# Patient Record
Sex: Female | Born: 1985
Health system: Southern US, Community
[De-identification: ages and names within clinical notes are randomized; demographics above are authoritative.]

## PROBLEM LIST (undated history)

## (undated) DIAGNOSIS — S069X0S Unspecified intracranial injury without loss of consciousness, sequela: Secondary | ICD-10-CM

## (undated) DIAGNOSIS — R4189 Other symptoms and signs involving cognitive functions and awareness: Secondary | ICD-10-CM

## (undated) DIAGNOSIS — N920 Excessive and frequent menstruation with regular cycle: Secondary | ICD-10-CM

## (undated) DIAGNOSIS — Z8781 Personal history of (healed) traumatic fracture: Secondary | ICD-10-CM

## (undated) DIAGNOSIS — F419 Anxiety disorder, unspecified: Secondary | ICD-10-CM

## (undated) DIAGNOSIS — G894 Chronic pain syndrome: Secondary | ICD-10-CM

## (undated) DIAGNOSIS — S069XAA Unspecified intracranial injury with loss of consciousness status unknown, initial encounter: Secondary | ICD-10-CM

## (undated) DIAGNOSIS — S069X9A Unspecified intracranial injury with loss of consciousness of unspecified duration, initial encounter: Secondary | ICD-10-CM

## (undated) DIAGNOSIS — R011 Cardiac murmur, unspecified: Secondary | ICD-10-CM

## (undated) DIAGNOSIS — F32A Depression, unspecified: Secondary | ICD-10-CM

## (undated) DIAGNOSIS — G43E09 Chronic migraine with aura, not intractable, without status migrainosus: Secondary | ICD-10-CM

## (undated) DIAGNOSIS — M542 Cervicalgia: Secondary | ICD-10-CM

## (undated) DIAGNOSIS — D649 Anemia, unspecified: Secondary | ICD-10-CM

## (undated) DIAGNOSIS — F068 Other specified mental disorders due to known physiological condition: Secondary | ICD-10-CM

## (undated) DIAGNOSIS — G43109 Migraine with aura, not intractable, without status migrainosus: Secondary | ICD-10-CM

## (undated) DIAGNOSIS — F329 Major depressive disorder, single episode, unspecified: Secondary | ICD-10-CM

## (undated) HISTORY — DX: Personal history of (healed) traumatic fracture: Z87.81

## (undated) HISTORY — DX: Cervicalgia: M54.2

## (undated) HISTORY — PX: OTHER SURGICAL HISTORY: SHX169

## (undated) HISTORY — DX: Migraine with aura, not intractable, without status migrainosus: G43.109

## (undated) HISTORY — DX: Anemia, unspecified: D64.9

## (undated) HISTORY — PX: APPENDECTOMY: SHX54

## (undated) HISTORY — DX: Chronic migraine with aura, not intractable, without status migrainosus: G43.E09

---

## 2009-05-31 ENCOUNTER — Ambulatory Visit (HOSPITAL_COMMUNITY): Admission: RE | Admit: 2009-05-31 | Discharge: 2009-05-31 | Payer: Self-pay | Admitting: Obstetrics and Gynecology

## 2009-06-15 ENCOUNTER — Ambulatory Visit (HOSPITAL_COMMUNITY): Admission: RE | Admit: 2009-06-15 | Discharge: 2009-06-15 | Payer: Self-pay | Admitting: Obstetrics and Gynecology

## 2009-06-22 ENCOUNTER — Ambulatory Visit (HOSPITAL_COMMUNITY): Admission: RE | Admit: 2009-06-22 | Discharge: 2009-06-22 | Payer: Self-pay | Admitting: Obstetrics and Gynecology

## 2009-06-29 ENCOUNTER — Ambulatory Visit (HOSPITAL_COMMUNITY): Admission: RE | Admit: 2009-06-29 | Discharge: 2009-06-29 | Payer: Self-pay | Admitting: Obstetrics and Gynecology

## 2009-07-20 ENCOUNTER — Ambulatory Visit (HOSPITAL_COMMUNITY): Admission: RE | Admit: 2009-07-20 | Discharge: 2009-07-20 | Payer: Self-pay | Admitting: Obstetrics and Gynecology

## 2009-08-10 ENCOUNTER — Ambulatory Visit (HOSPITAL_COMMUNITY): Admission: RE | Admit: 2009-08-10 | Discharge: 2009-08-10 | Payer: Self-pay | Admitting: Obstetrics and Gynecology

## 2010-10-30 ENCOUNTER — Encounter: Payer: Self-pay | Admitting: Obstetrics and Gynecology

## 2012-12-14 ENCOUNTER — Emergency Department (HOSPITAL_COMMUNITY): Payer: Medicaid Other

## 2012-12-14 ENCOUNTER — Encounter (HOSPITAL_COMMUNITY): Payer: Self-pay | Admitting: Anesthesiology

## 2012-12-14 ENCOUNTER — Emergency Department (HOSPITAL_COMMUNITY): Payer: Medicaid Other | Admitting: Anesthesiology

## 2012-12-14 ENCOUNTER — Inpatient Hospital Stay (HOSPITAL_COMMUNITY): Payer: Medicaid Other

## 2012-12-14 ENCOUNTER — Inpatient Hospital Stay (HOSPITAL_COMMUNITY)
Admission: EM | Admit: 2012-12-14 | Discharge: 2012-12-26 | DRG: 957 | Disposition: A | Discharge status: Rehabilitation Facility | Payer: Medicaid Other | Attending: Orthopedic Surgery | Admitting: Orthopedic Surgery

## 2012-12-14 ENCOUNTER — Encounter (HOSPITAL_COMMUNITY): Payer: Self-pay | Admitting: Emergency Medicine

## 2012-12-14 ENCOUNTER — Encounter (HOSPITAL_COMMUNITY): Admission: EM | Disposition: A | Discharge status: Rehabilitation Facility | Payer: Self-pay | Source: Home / Self Care

## 2012-12-14 DIAGNOSIS — S060X9A Concussion with loss of consciousness of unspecified duration, initial encounter: Secondary | ICD-10-CM

## 2012-12-14 DIAGNOSIS — S12100A Unspecified displaced fracture of second cervical vertebra, initial encounter for closed fracture: Principal | ICD-10-CM | POA: Diagnosis present

## 2012-12-14 DIAGNOSIS — S129XXA Fracture of neck, unspecified, initial encounter: Secondary | ICD-10-CM

## 2012-12-14 DIAGNOSIS — S27329A Contusion of lung, unspecified, initial encounter: Secondary | ICD-10-CM

## 2012-12-14 DIAGNOSIS — IMO0002 Reserved for concepts with insufficient information to code with codable children: Secondary | ICD-10-CM | POA: Diagnosis present

## 2012-12-14 DIAGNOSIS — S01502A Unspecified open wound of oral cavity, initial encounter: Secondary | ICD-10-CM | POA: Diagnosis present

## 2012-12-14 DIAGNOSIS — S2220XA Unspecified fracture of sternum, initial encounter for closed fracture: Secondary | ICD-10-CM

## 2012-12-14 DIAGNOSIS — N390 Urinary tract infection, site not specified: Secondary | ICD-10-CM | POA: Diagnosis not present

## 2012-12-14 DIAGNOSIS — G43909 Migraine, unspecified, not intractable, without status migrainosus: Secondary | ICD-10-CM | POA: Diagnosis present

## 2012-12-14 DIAGNOSIS — S82209A Unspecified fracture of shaft of unspecified tibia, initial encounter for closed fracture: Secondary | ICD-10-CM

## 2012-12-14 DIAGNOSIS — D62 Acute posthemorrhagic anemia: Secondary | ICD-10-CM | POA: Diagnosis present

## 2012-12-14 DIAGNOSIS — S93439A Sprain of tibiofibular ligament of unspecified ankle, initial encounter: Secondary | ICD-10-CM | POA: Diagnosis present

## 2012-12-14 DIAGNOSIS — S060XAA Concussion with loss of consciousness status unknown, initial encounter: Secondary | ICD-10-CM | POA: Diagnosis present

## 2012-12-14 DIAGNOSIS — Y998 Other external cause status: Secondary | ICD-10-CM

## 2012-12-14 DIAGNOSIS — R339 Retention of urine, unspecified: Secondary | ICD-10-CM | POA: Diagnosis not present

## 2012-12-14 DIAGNOSIS — S82899A Other fracture of unspecified lower leg, initial encounter for closed fracture: Secondary | ICD-10-CM | POA: Diagnosis present

## 2012-12-14 DIAGNOSIS — J95821 Acute postprocedural respiratory failure: Secondary | ICD-10-CM | POA: Diagnosis not present

## 2012-12-14 DIAGNOSIS — S82209B Unspecified fracture of shaft of unspecified tibia, initial encounter for open fracture type I or II: Secondary | ICD-10-CM | POA: Diagnosis present

## 2012-12-14 DIAGNOSIS — S82899B Other fracture of unspecified lower leg, initial encounter for open fracture type I or II: Secondary | ICD-10-CM | POA: Diagnosis present

## 2012-12-14 DIAGNOSIS — S82891A Other fracture of right lower leg, initial encounter for closed fracture: Secondary | ICD-10-CM

## 2012-12-14 DIAGNOSIS — J9600 Acute respiratory failure, unspecified whether with hypoxia or hypercapnia: Secondary | ICD-10-CM

## 2012-12-14 DIAGNOSIS — S82892A Other fracture of left lower leg, initial encounter for closed fracture: Secondary | ICD-10-CM

## 2012-12-14 DIAGNOSIS — A498 Other bacterial infections of unspecified site: Secondary | ICD-10-CM | POA: Diagnosis not present

## 2012-12-14 DIAGNOSIS — Y92410 Unspecified street and highway as the place of occurrence of the external cause: Secondary | ICD-10-CM

## 2012-12-14 DIAGNOSIS — S82409A Unspecified fracture of shaft of unspecified fibula, initial encounter for closed fracture: Secondary | ICD-10-CM

## 2012-12-14 HISTORY — PX: EXTERNAL FIXATION LEG: SHX1549

## 2012-12-14 HISTORY — DX: Anxiety disorder, unspecified: F41.9

## 2012-12-14 HISTORY — DX: Depression, unspecified: F32.A

## 2012-12-14 HISTORY — DX: Major depressive disorder, single episode, unspecified: F32.9

## 2012-12-14 HISTORY — DX: Cardiac murmur, unspecified: R01.1

## 2012-12-14 HISTORY — PX: ORIF ANKLE FRACTURE: SHX5408

## 2012-12-14 HISTORY — PX: I & D EXTREMITY: SHX5045

## 2012-12-14 LAB — URINALYSIS, MICROSCOPIC ONLY
Bilirubin Urine: NEGATIVE
Leukocytes, UA: NEGATIVE
pH: 5.5 (ref 5.0–8.0)

## 2012-12-14 LAB — POCT I-STAT, CHEM 8
BUN: 12 mg/dL (ref 6–23)
Calcium, Ion: 1.14 mmol/L (ref 1.12–1.23)
Creatinine, Ser: 0.9 mg/dL (ref 0.50–1.10)
Glucose, Bld: 220 mg/dL — ABNORMAL HIGH (ref 70–99)
TCO2: 27 mmol/L (ref 0–100)

## 2012-12-14 LAB — CBC
HCT: 29.6 % — ABNORMAL LOW (ref 36.0–46.0)
HCT: 37.1 % (ref 36.0–46.0)
Hemoglobin: 12.3 g/dL (ref 12.0–15.0)
Hemoglobin: 9.9 g/dL — ABNORMAL LOW (ref 12.0–15.0)
MCH: 26 pg (ref 26.0–34.0)
MCHC: 33.2 g/dL (ref 30.0–36.0)
MCHC: 33.4 g/dL (ref 30.0–36.0)
MCV: 78.9 fL (ref 78.0–100.0)
RBC: 4.73 MIL/uL (ref 3.87–5.11)
RDW: 13.1 % (ref 11.5–15.5)
RDW: 13.2 % (ref 11.5–15.5)
WBC: 23.3 10*3/uL — ABNORMAL HIGH (ref 4.0–10.5)

## 2012-12-14 LAB — COMPREHENSIVE METABOLIC PANEL
ALT: 24 U/L (ref 0–35)
Albumin: 4.1 g/dL (ref 3.5–5.2)
BUN: 12 mg/dL (ref 6–23)
CO2: 22 mEq/L (ref 19–32)
Calcium: 9 mg/dL (ref 8.4–10.5)
Chloride: 98 mEq/L (ref 96–112)
GFR calc non Af Amer: >90 mL/min (ref >90)
Potassium: 3.2 mEq/L — ABNORMAL LOW (ref 3.5–5.1)
Sodium: 136 mEq/L (ref 135–145)
Total Bilirubin: 0.5 mg/dL (ref 0.3–1.2)
Total Protein: 7.6 g/dL (ref 6.0–8.3)

## 2012-12-14 LAB — GLUCOSE, CAPILLARY: Glucose-Capillary: 163 mg/dL — ABNORMAL HIGH (ref 70–99)

## 2012-12-14 LAB — RAPID URINE DRUG SCREEN, HOSP PERFORMED
Amphetamines: NOT DETECTED
Barbiturates: NOT DETECTED
Benzodiazepines: NOT DETECTED
Cocaine: NOT DETECTED
Tetrahydrocannabinol: NOT DETECTED

## 2012-12-14 LAB — POCT I-STAT 3, ART BLOOD GAS (G3+)
Acid-Base Excess: 2 mmol/L (ref 0.0–2.0)
Acid-Base Excess: 1 mmol/L (ref 0.0–2.0)
Acid-Base Excess: 2 mmol/L (ref 0.0–2.0)
Bicarbonate: 28.6 mEq/L — ABNORMAL HIGH (ref 20.0–24.0)
Bicarbonate: 28.4 mEq/L — ABNORMAL HIGH (ref 20.0–24.0)
O2 Saturation: 100 %
TCO2: 28 mmol/L (ref 0–100)
pCO2 arterial: 49.2 mmHg — ABNORMAL HIGH (ref 35.0–45.0)
pO2, Arterial: 180 mmHg — ABNORMAL HIGH (ref 80.0–100.0)
pO2, Arterial: 177 mmHg — ABNORMAL HIGH (ref 80.0–100.0)

## 2012-12-14 LAB — BASIC METABOLIC PANEL
BUN: 7 mg/dL (ref 6–23)
CO2: 24 mEq/L (ref 19–32)
Chloride: 105 mEq/L (ref 96–112)
Creatinine, Ser: 0.54 mg/dL (ref 0.50–1.10)

## 2012-12-14 LAB — POCT I-STAT 7, (LYTES, BLD GAS, ICA,H+H)
Calcium, Ion: 1.12 mmol/L (ref 1.12–1.23)
O2 Saturation: 100 %
Sodium: 138 mEq/L (ref 135–145)
TCO2: 27 mmol/L (ref 0–100)
pCO2 arterial: 41.4 mmHg (ref 35.0–45.0)

## 2012-12-14 LAB — PROTIME-INR
INR: 0.97 (ref 0.00–1.49)
Prothrombin Time: 12.8 seconds (ref 11.6–15.2)

## 2012-12-14 SURGERY — OPEN REDUCTION INTERNAL FIXATION (ORIF) ANKLE FRACTURE
Anesthesia: General | Site: Leg Lower | Laterality: Left | Wound class: Contaminated

## 2012-12-14 MED ORDER — HYDROMORPHONE HCL PF 1 MG/ML IJ SOLN: 0.2500 mg | INTRAMUSCULAR | Status: DC | PRN

## 2012-12-14 MED ORDER — TETANUS-DIPHTHERIA TOXOIDS TD 5-2 LFU IM INJ
0.5000 mL | INJECTION | Freq: Once | INTRAMUSCULAR | Status: AC
  Administered 2012-12-14: 0.5 mL via INTRAMUSCULAR
  Filled 2012-12-14: qty 0.5

## 2012-12-14 MED ORDER — CHLORHEXIDINE GLUCONATE 0.12 % MT SOLN
15.0000 mL | Freq: Two times a day (BID) | OROMUCOSAL | Status: DC
  Administered 2012-12-15 – 2012-12-26 (×18): 15 mL via OROMUCOSAL
  Filled 2012-12-14 (×24): qty 15

## 2012-12-14 MED ORDER — PROPOFOL 10 MG/ML IV EMUL
5.0000 ug/kg/min | INTRAVENOUS | Status: DC
  Administered 2012-12-15: 25 ug/kg/min via INTRAVENOUS
  Administered 2012-12-15 (×2): 50 ug/kg/min via INTRAVENOUS
  Filled 2012-12-14 (×5): qty 100

## 2012-12-14 MED ORDER — SODIUM CHLORIDE 0.9 % IV SOLN
1000.0000 mL | Freq: Once | INTRAVENOUS | Status: AC
  Administered 2012-12-14: 1000 mL via INTRAVENOUS

## 2012-12-14 MED ORDER — ROCURONIUM BROMIDE 100 MG/10ML IV SOLN
INTRAVENOUS | Status: DC | PRN
  Administered 2012-12-14: 50 mg via INTRAVENOUS

## 2012-12-14 MED ORDER — FENTANYL CITRATE 0.05 MG/ML IJ SOLN
100.0000 ug | INTRAMUSCULAR | Status: DC | PRN
  Administered 2012-12-14 – 2012-12-16 (×2): 100 ug via INTRAVENOUS
  Administered 2012-12-16 (×3): 50 ug via INTRAVENOUS
  Administered 2012-12-16 (×2): 100 ug via INTRAVENOUS
  Filled 2012-12-14 (×8): qty 2

## 2012-12-14 MED ORDER — HYDROMORPHONE HCL PF 1 MG/ML IJ SOLN: 0.5000 mg | INTRAMUSCULAR | Status: DC | PRN

## 2012-12-14 MED ORDER — LIDOCAINE HCL (CARDIAC) 20 MG/ML IV SOLN
INTRAVENOUS | Status: DC | PRN
  Administered 2012-12-14: 10 mg via INTRAVENOUS

## 2012-12-14 MED ORDER — CEFAZOLIN SODIUM 1-5 GM-% IV SOLN: 1.0000 g | Freq: Once | INTRAVENOUS | Status: DC

## 2012-12-14 MED ORDER — SODIUM CHLORIDE 0.9 % IV SOLN
1000.0000 mL | INTRAVENOUS | Status: DC
  Administered 2012-12-14: 1000 mL via INTRAVENOUS

## 2012-12-14 MED ORDER — MEPERIDINE HCL 25 MG/ML IJ SOLN: 6.2500 mg | INTRAMUSCULAR | Status: DC | PRN

## 2012-12-14 MED ORDER — FENTANYL CITRATE 0.05 MG/ML IJ SOLN
INTRAMUSCULAR | Status: AC
  Administered 2012-12-14: 100 ug via INTRAVENOUS
  Filled 2012-12-14: qty 2

## 2012-12-14 MED ORDER — DOCUSATE SODIUM 100 MG PO CAPS
100.0000 mg | ORAL_CAPSULE | Freq: Two times a day (BID) | ORAL | Status: DC
  Filled 2012-12-14: qty 1

## 2012-12-14 MED ORDER — MEPERIDINE HCL 25 MG/ML IJ SOLN: 25.0000 mg | Freq: Once | INTRAMUSCULAR | Status: DC

## 2012-12-14 MED ORDER — ONDANSETRON HCL 4 MG/2ML IJ SOLN: 4.0000 mg | Freq: Four times a day (QID) | INTRAMUSCULAR | Status: DC | PRN

## 2012-12-14 MED ORDER — OXYCODONE HCL 5 MG PO TABS: 10.0000 mg | ORAL_TABLET | ORAL | Status: DC | PRN

## 2012-12-14 MED ORDER — NALOXONE HCL 0.4 MG/ML IJ SOLN
0.4000 mg | Freq: Once | INTRAMUSCULAR | Status: DC
  Filled 2012-12-14: qty 2

## 2012-12-14 MED ORDER — IOHEXOL 300 MG/ML  SOLN
100.0000 mL | Freq: Once | INTRAMUSCULAR | Status: AC | PRN
  Administered 2012-12-14: 100 mL via INTRAVENOUS

## 2012-12-14 MED ORDER — MIDAZOLAM HCL 5 MG/5ML IJ SOLN
INTRAMUSCULAR | Status: DC | PRN
  Administered 2012-12-14: 4 mg via INTRAVENOUS

## 2012-12-14 MED ORDER — OXYCODONE HCL 5 MG/5ML PO SOLN: 5.0000 mg | Freq: Once | ORAL | Status: DC | PRN

## 2012-12-14 MED ORDER — CEFAZOLIN SODIUM 1-5 GM-% IV SOLN
INTRAVENOUS | Status: DC | PRN
  Administered 2012-12-14: 1 g via INTRAVENOUS

## 2012-12-14 MED ORDER — POTASSIUM CHLORIDE 10 MEQ/100ML IV SOLN
10.0000 meq | INTRAVENOUS | Status: AC
  Administered 2012-12-14 (×2): 10 meq via INTRAVENOUS
  Filled 2012-12-14 (×2): qty 100

## 2012-12-14 MED ORDER — CEFAZOLIN SODIUM 1-5 GM-% IV SOLN
1.0000 g | Freq: Once | INTRAVENOUS | Status: AC
  Administered 2012-12-14: 1 g via INTRAVENOUS
  Filled 2012-12-14: qty 50

## 2012-12-14 MED ORDER — CHLORHEXIDINE GLUCONATE 0.12 % MT SOLN
OROMUCOSAL | Status: AC
  Administered 2012-12-14: 15 mL via OROMUCOSAL
  Filled 2012-12-14: qty 15

## 2012-12-14 MED ORDER — ONDANSETRON HCL 4 MG PO TABS: 4.0000 mg | ORAL_TABLET | Freq: Four times a day (QID) | ORAL | Status: DC | PRN

## 2012-12-14 MED ORDER — PANTOPRAZOLE SODIUM 40 MG IV SOLR
40.0000 mg | Freq: Every day | INTRAVENOUS | Status: DC
  Administered 2012-12-15 – 2012-12-16 (×2): 40 mg via INTRAVENOUS
  Filled 2012-12-14 (×4): qty 40

## 2012-12-14 MED ORDER — SODIUM CHLORIDE 0.9 % IV SOLN
25.0000 ug/h | INTRAVENOUS | Status: DC
  Administered 2012-12-14: 200 ug/h via INTRAVENOUS
  Administered 2012-12-14: 250 ug/h via INTRAVENOUS
  Administered 2012-12-15: 300 ug/h via INTRAVENOUS
  Filled 2012-12-14 (×3): qty 50

## 2012-12-14 MED ORDER — PROPOFOL INFUSION 10 MG/ML OPTIME
INTRAVENOUS | Status: DC | PRN
  Administered 2012-12-14: 25 ug/kg/min via INTRAVENOUS

## 2012-12-14 MED ORDER — FENTANYL CITRATE 0.05 MG/ML IJ SOLN
INTRAMUSCULAR | Status: DC | PRN
  Administered 2012-12-14 (×4): 125 ug via INTRAVENOUS
  Administered 2012-12-14: 75 ug via INTRAVENOUS
  Administered 2012-12-14: 50 ug via INTRAVENOUS
  Administered 2012-12-14: 75 ug via INTRAVENOUS
  Administered 2012-12-14: 50 ug via INTRAVENOUS

## 2012-12-14 MED ORDER — KCL IN DEXTROSE-NACL 20-5-0.45 MEQ/L-%-% IV SOLN
INTRAVENOUS | Status: DC
  Administered 2012-12-14: 100 mL/h via INTRAVENOUS
  Administered 2012-12-14 – 2012-12-17 (×6): via INTRAVENOUS
  Filled 2012-12-14 (×12): qty 1000

## 2012-12-14 MED ORDER — CEFAZOLIN SODIUM 1-5 GM-% IV SOLN
INTRAVENOUS | Status: AC
  Filled 2012-12-14: qty 50

## 2012-12-14 MED ORDER — MIDAZOLAM HCL 2 MG/2ML IJ SOLN: 0.5000 mg | Freq: Once | INTRAMUSCULAR | Status: DC | PRN

## 2012-12-14 MED ORDER — IOHEXOL 350 MG/ML SOLN
50.0000 mL | Freq: Once | INTRAVENOUS | Status: AC | PRN
  Administered 2012-12-14: 50 mL via INTRAVENOUS

## 2012-12-14 MED ORDER — PROPOFOL 10 MG/ML IV EMUL
5.0000 ug/kg/min | INTRAVENOUS | Status: DC
  Administered 2012-12-14: 50 ug/kg/min via INTRAVENOUS
  Filled 2012-12-14: qty 100

## 2012-12-14 MED ORDER — INSULIN ASPART 100 UNIT/ML ~~LOC~~ SOLN
0.0000 [IU] | SUBCUTANEOUS | Status: DC
  Administered 2012-12-15 – 2012-12-16 (×3): 1 [IU] via SUBCUTANEOUS

## 2012-12-14 MED ORDER — DOCUSATE SODIUM 50 MG/5ML PO LIQD
100.0000 mg | Freq: Two times a day (BID) | ORAL | Status: DC
  Administered 2012-12-15 – 2012-12-16 (×3): 100 mg via ORAL
  Filled 2012-12-14 (×7): qty 10

## 2012-12-14 MED ORDER — PANTOPRAZOLE SODIUM 40 MG PO TBEC: 40.0000 mg | DELAYED_RELEASE_TABLET | Freq: Every day | ORAL | Status: DC

## 2012-12-14 MED ORDER — LACTATED RINGERS IV SOLN
INTRAVENOUS | Status: DC | PRN
  Administered 2012-12-14 (×3): via INTRAVENOUS

## 2012-12-14 MED ORDER — OXYCODONE HCL 5 MG PO TABS: 5.0000 mg | ORAL_TABLET | Freq: Once | ORAL | Status: DC | PRN

## 2012-12-14 MED ORDER — CEFAZOLIN SODIUM-DEXTROSE 2-3 GM-% IV SOLR
2.0000 g | Freq: Three times a day (TID) | INTRAVENOUS | Status: AC
  Administered 2012-12-14 – 2012-12-16 (×6): 2 g via INTRAVENOUS
  Filled 2012-12-14 (×6): qty 50

## 2012-12-14 MED ORDER — PROMETHAZINE HCL 25 MG/ML IJ SOLN: 6.2500 mg | INTRAMUSCULAR | Status: DC | PRN

## 2012-12-14 MED ORDER — ACETAMINOPHEN 325 MG PO TABS
650.0000 mg | ORAL_TABLET | ORAL | Status: DC | PRN
  Administered 2012-12-16: 650 mg via ORAL
  Filled 2012-12-14 (×2): qty 1

## 2012-12-14 MED ORDER — FENTANYL BOLUS VIA INFUSION
25.0000 ug | Freq: Four times a day (QID) | INTRAVENOUS | Status: DC | PRN
  Filled 2012-12-14: qty 100

## 2012-12-14 MED ORDER — PROPOFOL 10 MG/ML IV BOLUS
INTRAVENOUS | Status: DC | PRN
  Administered 2012-12-14: 150 mg via INTRAVENOUS

## 2012-12-14 MED ORDER — OXYCODONE HCL 5 MG PO TABS: 5.0000 mg | ORAL_TABLET | ORAL | Status: DC | PRN

## 2012-12-14 MED ORDER — SUCCINYLCHOLINE CHLORIDE 20 MG/ML IJ SOLN
INTRAMUSCULAR | Status: DC | PRN
  Administered 2012-12-14: 100 mg via INTRAVENOUS

## 2012-12-14 SURGICAL SUPPLY — 59 items
BANDAGE ELASTIC 4 VELCRO ST LF (GAUZE/BANDAGES/DRESSINGS) IMPLANT
BANDAGE ELASTIC 6 VELCRO ST LF (GAUZE/BANDAGES/DRESSINGS) IMPLANT
BANDAGE ESMARK 6X9 LF (GAUZE/BANDAGES/DRESSINGS) ×4 IMPLANT
BANDAGE GAUZE ELAST BULKY 4 IN (GAUZE/BANDAGES/DRESSINGS) IMPLANT
BNDG COHESIVE 4X5 TAN STRL (GAUZE/BANDAGES/DRESSINGS) IMPLANT
BNDG ESMARK 6X9 LF (GAUZE/BANDAGES/DRESSINGS) ×5
CLAMP LG COMBINATION (Clamp) ×20 IMPLANT
CLAMP LG MULTI PIN (Clamp) ×10 IMPLANT
CLAMP ROD ATTACHMENT (Clamp) ×10 IMPLANT
CLOTH BEACON ORANGE TIMEOUT ST (SAFETY) ×5 IMPLANT
COVER SURGICAL LIGHT HANDLE (MISCELLANEOUS) ×5 IMPLANT
CUFF TOURNIQUET SINGLE 34IN LL (TOURNIQUET CUFF) IMPLANT
CUFF TOURNIQUET SINGLE 44IN (TOURNIQUET CUFF) IMPLANT
DRAPE C-ARM 42X72 X-RAY (DRAPES) ×5 IMPLANT
DRAPE EXTREMITY BILATERAL (DRAPE) ×5 IMPLANT
DRAPE INCISE IOBAN 66X45 STRL (DRAPES) ×5 IMPLANT
DRAPE ORTHO SPLIT 77X108 STRL (DRAPES)
DRAPE SURG ORHT 6 SPLT 77X108 (DRAPES) IMPLANT
DRAPE U-SHAPE 47X51 STRL (DRAPES) ×10 IMPLANT
DRSG ADAPTIC 3X8 NADH LF (GAUZE/BANDAGES/DRESSINGS) ×5 IMPLANT
ELECT REM PT RETURN 9FT ADLT (ELECTROSURGICAL) ×5
ELECTRODE REM PT RTRN 9FT ADLT (ELECTROSURGICAL) ×4 IMPLANT
EVACUATOR 1/8 PVC DRAIN (DRAIN) IMPLANT
GAUZE XEROFORM 5X9 LF (GAUZE/BANDAGES/DRESSINGS) ×5 IMPLANT
GLOVE BIOGEL PI IND STRL 6.5 (GLOVE) ×8 IMPLANT
GLOVE BIOGEL PI IND STRL 7.5 (GLOVE) ×4 IMPLANT
GLOVE BIOGEL PI INDICATOR 6.5 (GLOVE) ×2
GLOVE BIOGEL PI INDICATOR 7.5 (GLOVE) ×1
GLOVE SKINSENSE NS SZ8.0 LF (GLOVE)
GLOVE SKINSENSE STRL SZ8.0 LF (GLOVE) IMPLANT
GLOVE SURG ORTHO 8.0 STRL STRW (GLOVE) ×5 IMPLANT
GOWN STRL NON-REIN LRG LVL3 (GOWN DISPOSABLE) ×5 IMPLANT
KIT BASIN OR (CUSTOM PROCEDURE TRAY) ×5 IMPLANT
KIT ROOM TURNOVER OR (KITS) ×5 IMPLANT
PACK ORTHO EXTREMITY (CUSTOM PROCEDURE TRAY) ×5 IMPLANT
PAD ARMBOARD 7.5X6 YLW CONV (MISCELLANEOUS) ×10 IMPLANT
ROD CRBN FBR LRG EX-FX 11X300 (Rod) ×5 IMPLANT
ROD CRBN FBR LRG EX-FX 11X350 (Rod) ×15 IMPLANT
SCREW CANCEL 4.0 16MM (Screw) ×10 IMPLANT
SCREW CORTEX 3.5 14MM (Screw) ×3 IMPLANT
SCREW CORTEX 3.5 20MM (Screw) ×1 IMPLANT
SCREW LOCK CORT ST 3.5X14 (Screw) ×12 IMPLANT
SCREW LOCK CORT ST 3.5X20 (Screw) ×4 IMPLANT
SPONGE GAUZE 4X4 12PLY (GAUZE/BANDAGES/DRESSINGS) ×5 IMPLANT
SPONGE LAP 18X18 X RAY DECT (DISPOSABLE) ×10 IMPLANT
STAPLER VISISTAT 35W (STAPLE) IMPLANT
STOCKINETTE TUBULAR 6 INCH (GAUZE/BANDAGES/DRESSINGS) ×5 IMPLANT
SUT ETHILON 2 0 FS 18 (SUTURE) ×5 IMPLANT
SUT PROLENE 3 0 PS 2 (SUTURE) IMPLANT
SUT VIC AB 0 CT1 27 (SUTURE) ×1
SUT VIC AB 0 CT1 27XBRD ANBCTR (SUTURE) ×4 IMPLANT
SUT VIC AB 2-0 CT1 27 (SUTURE) ×2
SUT VIC AB 2-0 CT1 TAPERPNT 27 (SUTURE) ×8 IMPLANT
TOWEL OR 17X24 6PK STRL BLUE (TOWEL DISPOSABLE) ×5 IMPLANT
TOWEL OR 17X26 10 PK STRL BLUE (TOWEL DISPOSABLE) ×10 IMPLANT
TUBE CONNECTING 12X1/4 (SUCTIONS) ×5 IMPLANT
YANKAUER SUCT BULB TIP NO VENT (SUCTIONS) ×5 IMPLANT
screws ×20 IMPLANT
transfixation pin ×10 IMPLANT

## 2012-12-14 NOTE — Progress Notes (Signed)
Vent changes made per MD order. RR 20 VT 7cc which isi 350
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
## 2012-12-14 NOTE — Transfer of Care (Signed)
Immediate Anesthesia Transfer of Care Note  Patient: Monique [name]  Procedure(s) Performed: Procedure(s): OPEN REDUCTION INTERNAL FIXATION (ORIF) ANKLE FRACTURE (Left) IRRIGATION AND DEBRIDEMENT EXTREMITY (Left) EXTERNAL FIXATION LEG (Bilateral)  Patient Location: SICU  Anesthesia Type:General  Level of Consciousness: Patient remains intubated per anesthesia plan  Airway & Oxygen Therapy: Patient remains intubated per anesthesia plan and Patient placed on Ventilator (see vital sign flow sheet for setting)  Post-op Assessment: Report given to PACU RN  Post vital signs: Reviewed and stable  Complications: No apparent anesthesia complications

## 2012-12-14 NOTE — Progress Notes (Signed)
Pt transported from SICU to CT and back to SICU on vent. No complications during transport. BBS cl, HR 86, RR 21.

## 2012-12-14 NOTE — ED Notes (Signed)
Cap refill less than 2 seconds and pulses present bilaterally. Able to slip two fingers between restraints and wrist.
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
## 2012-12-14 NOTE — Anesthesia Preprocedure Evaluation (Signed)
Anesthesia Evaluation  Patient identified by MRN, date of birth, ID band Patient unresponsive    Reviewed: Allergy & Precautions, H&P , Patient's Chart, lab work & pertinent test results, Unable to perform ROS - Chart review only  History of Anesthesia Complications Negative for: history of anesthetic complications  Airway  TM Distance: >3 FB Neck ROM: Full    Dental   Pulmonary  Sternal fracture, pulm contusion R breath sounds clear to auscultation  Pulmonary exam normal       Cardiovascular negative cardio ROS  Rhythm:Regular Rate:Normal     Neuro/Psych PSYCHIATRIC DISORDERS Anxiety Patient very somnolent, only responds to name Cannot answer questions  C-spine not cleared    GI/Hepatic negative GI ROS, Neg liver ROS,   Endo/Other  Morbid obesity  Renal/GU negative Renal ROS     Musculoskeletal   Abdominal (+) + obese,   Peds  Hematology   Anesthesia Other Findings   Reproductive/Obstetrics                           Anesthesia Physical Anesthesia Plan  ASA: IV and emergent  Anesthesia Plan: General   Post-op Pain Management:    Induction: Intravenous  Airway Management Planned: Oral ETT and Video Laryngoscope Planned  Additional Equipment:   Intra-op Plan:   Post-operative Plan: Post-operative intubation/ventilation  Informed Consent:   Only emergency history available  Plan Discussed with: Surgeon and CRNA  Anesthesia Plan Comments: (Plan routine monitors, GETA with post op intubation  )        Anesthesia Quick Evaluation
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
## 2012-12-14 NOTE — Anesthesia Postprocedure Evaluation (Signed)
  Anesthesia Post-op Note  Patient: Monique [name]  Procedure(s) Performed: Procedure(s): OPEN REDUCTION INTERNAL FIXATION (ORIF) ANKLE FRACTURE (Left) IRRIGATION AND DEBRIDEMENT EXTREMITY (Left) EXTERNAL FIXATION LEG (Bilateral)  Patient Location: SICU  Anesthesia Type:General  Level of Consciousness: Patient remains intubated per anesthesia plan  Airway and Oxygen Therapy: Patient remains intubated per anesthesia plan  Post-op Pain: sedated, unable to evaluate  Post-op Assessment: Post-op Vital signs reviewed, Patient's Cardiovascular Status Stable and Respiratory Function Stable  Post-op Vital Signs: Reviewed and stable  Complications: No apparent anesthesia complications
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
## 2012-12-14 NOTE — Brief Op Note (Signed)
12/14/2012  8:20 AM  PATIENT:  Monique [name]  27 y.o. female  PRE-OPERATIVE DIAGNOSIS:  1. Open left distal tibia/fibula fracture, 2. Right distal tibia/fibula fracture  POST-OPERATIVE DIAGNOSIS:  1. Open left distal tibia/fibula fracture, 2. Right distal tibia/fibula fracture  PROCEDURE:  Procedure(s): OPEN REDUCTION INTERNAL FIXATION (ORIF) ANKLE FRACTURE (Left) IRRIGATION AND DEBRIDEMENT EXTREMITY (Left) EXTERNAL FIXATION LEG (Bilateral)  SURGEON:  Surgeon(s) and Role:    * Shelda Pal, MD - Primary  PHYSICIAN ASSISTANT: None   ANESTHESIA:   general  EBL:  Total I/O In: 2800 [I.V.:2800] Out: 1000 [Urine:900; Blood:100]  BLOOD ADMINISTERED:none  DRAINS: none   LOCAL MEDICATIONS USED:  NONE  SPECIMEN:  No Specimen  DISPOSITION OF SPECIMEN:  N/A  COUNTS:  YES  TOURNIQUET:  * No tourniquets in log *  DICTATION: .Other Dictation: Dictation Number [id number]  PLAN OF CARE: Admit to inpatient   PATIENT DISPOSITION:  To ICU intubated per anesthesia recommendations - hemodynamically stable.   Delay start of Pharmacological VTE agent (>24hrs) due to surgical blood loss or risk of bleeding: will defer to treating neurosurgery and general surgery trauma services

## 2012-12-14 NOTE — ED Notes (Signed)
Critical lab results shown to Dr.Otter

## 2012-12-14 NOTE — ED Notes (Signed)
Pt does not follow commands, responds to painful stimuli, attempts to remove her c-collar
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
## 2012-12-14 NOTE — ED Notes (Signed)
Verbal order for soft wrist restraints to prevent pt from pulling out IV and taking off c-collar received.

## 2012-12-15 ENCOUNTER — Encounter: Payer: Self-pay | Admitting: General Surgery

## 2012-12-15 DIAGNOSIS — S2220XA Unspecified fracture of sternum, initial encounter for closed fracture: Secondary | ICD-10-CM

## 2012-12-15 DIAGNOSIS — S12100A Unspecified displaced fracture of second cervical vertebra, initial encounter for closed fracture: Secondary | ICD-10-CM

## 2012-12-15 DIAGNOSIS — S82891A Other fracture of right lower leg, initial encounter for closed fracture: Secondary | ICD-10-CM

## 2012-12-15 DIAGNOSIS — J95821 Acute postprocedural respiratory failure: Secondary | ICD-10-CM

## 2012-12-15 DIAGNOSIS — S27329A Contusion of lung, unspecified, initial encounter: Secondary | ICD-10-CM

## 2012-12-15 LAB — GLUCOSE, CAPILLARY
Glucose-Capillary: 110 mg/dL — ABNORMAL HIGH (ref 70–99)
Glucose-Capillary: 125 mg/dL — ABNORMAL HIGH (ref 70–99)
Glucose-Capillary: 125 mg/dL — ABNORMAL HIGH (ref 70–99)
Glucose-Capillary: 128 mg/dL — ABNORMAL HIGH (ref 70–99)
Glucose-Capillary: 93 mg/dL (ref 70–99)

## 2012-12-15 LAB — BASIC METABOLIC PANEL
BUN: 4 mg/dL — ABNORMAL LOW (ref 6–23)
Chloride: 106 mEq/L (ref 96–112)
GFR calc non Af Amer: >90 mL/min (ref >90)
Glucose, Bld: 133 mg/dL — ABNORMAL HIGH (ref 70–99)
Potassium: 3.2 mEq/L — ABNORMAL LOW (ref 3.5–5.1)
Sodium: 139 mEq/L (ref 135–145)

## 2012-12-15 LAB — CBC
HCT: 28.5 % — ABNORMAL LOW (ref 36.0–46.0)
Hemoglobin: 9.5 g/dL — ABNORMAL LOW (ref 12.0–15.0)
RBC: 3.58 MIL/uL — ABNORMAL LOW (ref 3.87–5.11)
WBC: 7.7 10*3/uL (ref 4.0–10.5)

## 2012-12-15 MED ORDER — TIZANIDINE HCL 4 MG PO TABS
4.0000 mg | ORAL_TABLET | Freq: Three times a day (TID) | ORAL | Status: DC | PRN
  Administered 2012-12-20 – 2012-12-26 (×9): 4 mg via ORAL
  Filled 2012-12-15 (×9): qty 1

## 2012-12-15 MED ORDER — SODIUM CHLORIDE 0.9 % IV SOLN: 1000.0000 mL | INTRAVENOUS | Status: DC

## 2012-12-15 MED ORDER — POTASSIUM CHLORIDE 10 MEQ/100ML IV SOLN
10.0000 meq | INTRAVENOUS | Status: AC
  Administered 2012-12-15 (×3): 10 meq via INTRAVENOUS
  Filled 2012-12-15: qty 300

## 2012-12-15 MED ORDER — POTASSIUM CHLORIDE 2 MEQ/ML IV SOLN
INTRAVENOUS | Status: DC
  Filled 2012-12-15 (×2): qty 1000

## 2012-12-15 MED ORDER — OXYCODONE HCL 5 MG PO TABS
5.0000 mg | ORAL_TABLET | ORAL | Status: DC | PRN
  Administered 2012-12-15 – 2012-12-16 (×2): 10 mg via ORAL
  Administered 2012-12-16 (×2): 15 mg via ORAL
  Administered 2012-12-16 – 2012-12-17 (×5): 10 mg via ORAL
  Administered 2012-12-18: 15 mg via ORAL
  Administered 2012-12-19 – 2012-12-22 (×5): 5 mg via ORAL
  Administered 2012-12-23 – 2012-12-25 (×8): 10 mg via ORAL
  Administered 2012-12-25 (×2): 5 mg via ORAL
  Administered 2012-12-26 (×2): 10 mg via ORAL
  Filled 2012-12-15: qty 3
  Filled 2012-12-15: qty 2
  Filled 2012-12-15: qty 1
  Filled 2012-12-15 (×7): qty 2
  Filled 2012-12-15: qty 3
  Filled 2012-12-15: qty 1
  Filled 2012-12-15: qty 2
  Filled 2012-12-15: qty 1
  Filled 2012-12-15 (×2): qty 2
  Filled 2012-12-15 (×2): qty 3
  Filled 2012-12-15: qty 1
  Filled 2012-12-15: qty 2
  Filled 2012-12-15: qty 1
  Filled 2012-12-15: qty 2
  Filled 2012-12-15: qty 1
  Filled 2012-12-15: qty 3
  Filled 2012-12-15 (×4): qty 2

## 2012-12-15 MED ORDER — ENOXAPARIN SODIUM 30 MG/0.3ML ~~LOC~~ SOLN
30.0000 mg | Freq: Two times a day (BID) | SUBCUTANEOUS | Status: DC
  Administered 2012-12-15 – 2012-12-21 (×14): 30 mg via SUBCUTANEOUS
  Filled 2012-12-15 (×16): qty 0.3

## 2012-12-15 MED ORDER — IPRATROPIUM-ALBUTEROL 20-100 MCG/ACT IN AERS
6.0000 | INHALATION_SPRAY | Freq: Four times a day (QID) | RESPIRATORY_TRACT | Status: DC | PRN
  Filled 2012-12-15: qty 4
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
## 2012-12-15 NOTE — Procedures (Signed)
Extubation Procedure Note  Patient Details:   Name: Monique [name] DOB: [birth year] MRN: [id number]   Airway Documentation:     Evaluation  O2 sats: stable throughout Complications: No apparent complications Patient did tolerate procedure well. Bilateral Breath Sounds: Clear   Yes Positive cuff leak   Newt Lukes 12/15/2012, 9:51 AM

## 2012-12-16 LAB — BASIC METABOLIC PANEL
CO2: 24 mEq/L (ref 19–32)
Chloride: 103 mEq/L (ref 96–112)
GFR calc Af Amer: >90 mL/min (ref >90)
Potassium: 3.6 mEq/L (ref 3.5–5.1)

## 2012-12-16 LAB — CBC
HCT: 30.6 % — ABNORMAL LOW (ref 36.0–46.0)
MCV: 80.7 fL (ref 78.0–100.0)
Platelets: 227 10*3/uL (ref 150–400)
RBC: 3.79 MIL/uL — ABNORMAL LOW (ref 3.87–5.11)
RDW: 13.5 % (ref 11.5–15.5)
WBC: 11.2 10*3/uL — ABNORMAL HIGH (ref 4.0–10.5)

## 2012-12-16 LAB — GLUCOSE, CAPILLARY: Glucose-Capillary: 125 mg/dL — ABNORMAL HIGH (ref 70–99)

## 2012-12-16 NOTE — Progress Notes (Signed)
UR completed 
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
## 2012-12-17 ENCOUNTER — Inpatient Hospital Stay (HOSPITAL_COMMUNITY): Payer: Medicaid Other

## 2012-12-17 ENCOUNTER — Encounter (HOSPITAL_COMMUNITY): Payer: Self-pay | Admitting: Orthopedic Surgery

## 2012-12-17 LAB — URINALYSIS, ROUTINE W REFLEX MICROSCOPIC
Glucose, UA: NEGATIVE mg/dL
Leukocytes, UA: NEGATIVE
Nitrite: NEGATIVE
Specific Gravity, Urine: 1.014 (ref 1.005–1.030)
pH: 7.5 (ref 5.0–8.0)

## 2012-12-17 LAB — CBC
Hemoglobin: 9.9 g/dL — ABNORMAL LOW (ref 12.0–15.0)
MCV: 80.7 fL (ref 78.0–100.0)
Platelets: 233 10*3/uL (ref 150–400)
RBC: 3.79 MIL/uL — ABNORMAL LOW (ref 3.87–5.11)
WBC: 10.9 10*3/uL — ABNORMAL HIGH (ref 4.0–10.5)

## 2012-12-17 LAB — URINE MICROSCOPIC-ADD ON

## 2012-12-17 MED ORDER — DOCUSATE SODIUM 100 MG PO CAPS
100.0000 mg | ORAL_CAPSULE | Freq: Two times a day (BID) | ORAL | Status: DC
  Administered 2012-12-17 (×2): 100 mg via ORAL
  Filled 2012-12-17 (×4): qty 1

## 2012-12-17 MED ORDER — TRAMADOL HCL 50 MG PO TABS
100.0000 mg | ORAL_TABLET | Freq: Four times a day (QID) | ORAL | Status: DC
  Administered 2012-12-17 – 2012-12-26 (×34): 100 mg via ORAL
  Filled 2012-12-17 (×35): qty 2

## 2012-12-17 MED ORDER — ACETAMINOPHEN 325 MG PO TABS
650.0000 mg | ORAL_TABLET | ORAL | Status: DC | PRN
  Administered 2012-12-19: 650 mg via ORAL
  Filled 2012-12-17 (×2): qty 2

## 2012-12-17 MED ORDER — FENTANYL CITRATE 0.05 MG/ML IJ SOLN
100.0000 ug | INTRAMUSCULAR | Status: DC | PRN
  Administered 2012-12-17 – 2012-12-23 (×2): 100 ug via INTRAVENOUS
  Filled 2012-12-17: qty 2

## 2012-12-17 NOTE — Plan of Care (Signed)
Problem: Phase II Progression Outcomes Goal: Discharge plan established Outcome: Completed/Met Date Met:  12/17/12 Home with family.  Lives in an apartment with stairs.  Please address with physical therapy concerning stairs.

## 2012-12-17 NOTE — Progress Notes (Signed)
Transferred to 5N-31 via bed with IV.  Boyfriend at bedside.  Tolerated transfer well.  Oriented to room and surroundings.

## 2012-12-17 NOTE — Progress Notes (Signed)
Patient doing okay.  Pain mostly in her legs.  Working with therapy.  Currently no bed available on 5N.  This patient has been seen and I agree with the findings and treatment plan.  Marta Lamas. Gae Bon, MD, FACS [phone] (pager) [phone] (direct pager) Trauma Surgeon
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
## 2012-12-17 NOTE — Progress Notes (Addendum)
1610 Transferred from 2300 per bed drowsy but arousable. Bil toes warm unable to wiggle. Ext fixators intact bil lower legs gauze around pins ddry and intact. Cervical collar intact

## 2012-12-17 NOTE — Progress Notes (Signed)
Subjective: Patient reports doing better  Objective: Vital signs in last 24 hours: Temp:  [99.1 F (37.3 C)-101.8 F (38.8 C)] 99.1 F (37.3 C) (03/12 0356) Pulse Rate:  [104-131] 120 (03/12 0600) Resp:  [14-22] 16 (03/12 0600) BP: (104-125)/(64-86) 110/70 mmHg (03/12 0600) SpO2:  [90 %-100 %] 90 % (03/12 0600) Arterial Line BP: (131-139)/(63-67) 139/67 mmHg (03/11 1100) Weight:  [91.1 kg (200 lb 13.4 oz)] 91.1 kg (200 lb 13.4 oz) (03/12 0500)  Intake/Output from previous day: 03/11 0701 - 03/12 0700 In: 2540 [P.O.:240; I.V.:2300] Out: 3390 [Urine:3390] Intake/Output this shift:    Physical Exam: MAEW. Collar fits well  Lab Results:  Recent Labs  12/15/12 0404 12/16/12 0400  WBC 7.7 11.2*  HGB 9.5* 10.0*  HCT 28.5* 30.6*  PLT 219 227   BMET  Recent Labs  12/15/12 0404 12/16/12 0400  NA 139 136  K 3.2* 3.6  CL 106 103  CO2 27 24  GLUCOSE 133* 134*  BUN 4* 3*  CREATININE 0.70 0.62  CALCIUM 7.8* 8.4    Studies/Results: No results found.  Assessment/Plan: Mobilize, F/U as outpatient in my office in one month with C-spine Xrays.  Collar at all times.    LOS: 3 days    Dorian Heckle, MD 12/17/2012, 7:18 AM
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
## 2012-12-17 NOTE — Progress Notes (Signed)
Orthopedic Tech Progress Note Patient Details:  Monique [name] 1986-09-24 [id number]  Patient ID: Monique [name], female   DOB: 05-Jul-1986, 27 y.o.   MRN: [id number] Trapeze bar patient helper;viewed order from rn order list  Monique [name] 12/17/2012, 1:12 PM
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
## 2012-12-18 DIAGNOSIS — D62 Acute posthemorrhagic anemia: Secondary | ICD-10-CM | POA: Diagnosis not present

## 2012-12-18 DIAGNOSIS — N390 Urinary tract infection, site not specified: Secondary | ICD-10-CM

## 2012-12-18 DIAGNOSIS — S060X9A Concussion with loss of consciousness of unspecified duration, initial encounter: Secondary | ICD-10-CM

## 2012-12-18 DIAGNOSIS — R339 Retention of urine, unspecified: Secondary | ICD-10-CM | POA: Diagnosis not present

## 2012-12-18 LAB — CBC
MCHC: 32.9 g/dL (ref 30.0–36.0)
Platelets: 269 10*3/uL (ref 150–400)
RDW: 13.2 % (ref 11.5–15.5)
WBC: 7.9 10*3/uL (ref 4.0–10.5)

## 2012-12-18 MED ORDER — POLYETHYLENE GLYCOL 3350 17 G PO PACK
17.0000 g | PACK | Freq: Every day | ORAL | Status: DC
  Administered 2012-12-18 – 2012-12-20 (×3): 17 g via ORAL
  Filled 2012-12-18 (×4): qty 1

## 2012-12-18 MED ORDER — BETHANECHOL CHLORIDE 25 MG PO TABS
25.0000 mg | ORAL_TABLET | Freq: Four times a day (QID) | ORAL | Status: DC
  Administered 2012-12-18 – 2012-12-20 (×11): 25 mg via ORAL
  Filled 2012-12-18 (×16): qty 1

## 2012-12-18 MED ORDER — DOCUSATE SODIUM 50 MG/5ML PO LIQD
100.0000 mg | Freq: Two times a day (BID) | ORAL | Status: DC
  Administered 2012-12-18 (×2): 100 mg via ORAL
  Filled 2012-12-18 (×4): qty 10

## 2012-12-18 NOTE — Progress Notes (Signed)
PT Cancellation Note  Patient Details Name: Monique [name] MRN: [id number] DOB: November 12, 1985   Cancelled Treatment:    Reason Eval/Treat Not Completed: Medical issues which prohibited therapy.  Received order for PT. Pt is NWB on bilateral LEs and has C2 and sternal fractures. Please clarify amount of weightbearing, pushing and pulling with UEs that pt is permitted to do. Peter L. Green DPT [phone]     Alferd Apa 12/18/2012, 12:14 PM

## 2012-12-18 NOTE — Plan of Care (Signed)
Problem: Phase III Progression Outcomes Goal: Ambulate BID Outcome: Not Applicable Date Met:  12/18/12 Patient has BLE external fixators, NWB on both.

## 2012-12-18 NOTE — Progress Notes (Signed)
Subjective: Patient reports "My feet are so sore. i can't get my toes to do much"  Objective: Vital signs in last 24 hours: Temp:  [98.9 F (37.2 C)-100.3 F (37.9 C)] 99 F (37.2 C) (03/13 0622) Pulse Rate:  [73-121] 100 (03/13 0622) Resp:  [15-23] 16 (03/13 0622) BP: (102-112)/(60-75) 110/70 mmHg (03/13 0622) SpO2:  [92 %-99 %] 95 % (03/13 0622)  Intake/Output from previous day: 03/12 0701 - 03/13 0700 In: 1680 [P.O.:1080; I.V.:600] Out: 2485 [Urine:2485] Intake/Output this shift:    Alert & conversant this am. Anxious re: work note Occupational psychologist. Good strength BUE, Moves BLE, limited by splints/fixation. Difficulty moving toes but sensation intact toes both feet.  Lab Results:  Recent Labs  12/16/12 0400 12/17/12 0838  WBC 11.2* 10.9*  HGB 10.0* 9.9*  HCT 30.6* 30.6*  PLT 227 233   BMET  Recent Labs  12/16/12 0400  NA 136  K 3.6  CL 103  CO2 24  GLUCOSE 134*  BUN 3*  CREATININE 0.62  CALCIUM 8.4    Studies/Results: Dg Chest Port 1 View  12/17/2012  *RADIOLOGY REPORT*  Clinical Data: Cough.  Chest pain  PORTABLE CHEST - 1 VIEW  Comparison: 12/17/2012  Findings: The heart size and mediastinal contours are within normal limits.  Both lungs are clear.  The visualized skeletal structures are unremarkable.  IMPRESSION: Negative exam.   Original Report Authenticated By: Signa Kell, M.D.     Assessment/Plan: Cervical collar to remain at all times  LOS: 4 days  Pt understands plan for surgery LE first of week. From NS perspective, will f/u on outpatient basis, remaining in cervical collar at all times.   Georgiann Cocker 12/18/2012, 7:44 AM

## 2012-12-18 NOTE — Progress Notes (Signed)
Patient due to void overnight.  Unable to do so and complaining of feeling pressure as if she had to go.  Patient was bladder scanned to have 375 ml at 2045.  I+O cath done at 2120 produced .  Patient without void again this am.  Bladder scan showed to have at 0615, and second I+O cath produced at 625.  Fluids encouraged for allowance of spontaneous void.  Nursing will continue to monitor.

## 2012-12-18 NOTE — Progress Notes (Deleted)
Patient unable to spontaneously void . Will continue to monitor for adequate urine output and per MD order.
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
## 2012-12-18 NOTE — Progress Notes (Signed)
OT Cancellation Note  Patient Details Name: Monique [name] MRN: [id number] DOB: 20-Oct-1985   Cancelled Treatment:    Reason Eval/Treat Not Completed: Fatigue/lethargy limiting ability to participate;Medical issues which prohibited therapy. Pt NWB B LEs and has C2 and sternal fractures. Please clarify any weightbearing, pushing and pulling with UEs permitted      Galen Manila, OT 12/18/2012, 2:39 PM

## 2012-12-18 NOTE — Progress Notes (Signed)
Urinating better, good pain control.  Vaginal bleeding but says she has been on her cycle. Hb stable. Patient examined and I agree with the assessment and plan  Violeta Gelinas, MD, MPH, FACS Pager: [phone]  12/18/2012 1:14 PM
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
## 2012-12-18 NOTE — Progress Notes (Signed)
Patient improving. Continue to mobilize and continue C-collar.  F/U in my office in one month with Xrays.

## 2012-12-18 NOTE — Progress Notes (Signed)
Chart reviewed again--see note from 12/17/10. Therapy evaluations still pending.  Surgery set for next week with recommendations for slide/lift transfers. Will defer formal evaluation for now as difficult to gauge patient's ability to participate/tolerance of therapies.  Please re-consult past surgical fixation next Tues.

## 2012-12-18 NOTE — Progress Notes (Signed)
    Patient able  to spontaneously void . Will continue to monitor for adequate urine output and per MD order.

## 2012-12-19 DIAGNOSIS — N390 Urinary tract infection, site not specified: Secondary | ICD-10-CM | POA: Diagnosis not present

## 2012-12-19 LAB — URINE MICROSCOPIC-ADD ON

## 2012-12-19 LAB — URINALYSIS, ROUTINE W REFLEX MICROSCOPIC
Glucose, UA: NEGATIVE mg/dL
Ketones, ur: NEGATIVE mg/dL
Nitrite: NEGATIVE
Protein, ur: NEGATIVE mg/dL
pH: 7 (ref 5.0–8.0)

## 2012-12-19 MED ORDER — DOCUSATE SODIUM 50 MG/5ML PO LIQD
200.0000 mg | Freq: Two times a day (BID) | ORAL | Status: DC
  Administered 2012-12-19 – 2012-12-20 (×4): 200 mg via ORAL
  Filled 2012-12-19 (×6): qty 20

## 2012-12-19 MED ORDER — DOCUSATE SODIUM 100 MG PO CAPS: 200.0000 mg | ORAL_CAPSULE | Freq: Two times a day (BID) | ORAL | Status: DC

## 2012-12-19 MED ORDER — CIPROFLOXACIN HCL 500 MG PO TABS
500.0000 mg | ORAL_TABLET | Freq: Two times a day (BID) | ORAL | Status: AC
  Administered 2012-12-19 – 2012-12-21 (×6): 500 mg via ORAL
  Filled 2012-12-19 (×6): qty 1

## 2012-12-19 MED ORDER — BISACODYL 5 MG PO TBEC
5.0000 mg | DELAYED_RELEASE_TABLET | Freq: Every day | ORAL | Status: DC
  Administered 2012-12-19 – 2012-12-20 (×2): 5 mg via ORAL
  Filled 2012-12-19 (×2): qty 1

## 2012-12-19 NOTE — Progress Notes (Signed)
Occupational Therapy Treatment Patient Details Name: Monique [name] MRN: [id number] DOB: 1986/01/22 Today's Date: 12/19/2012 Time: 1308-6578 OT Time Calculation (min): 25 min  OT Assessment / Plan / Recommendation Comments on Treatment Session Fabricated bilateral foot plates from [employer] and attached to external fixators using velcro to help position bilateral ankles in neutral position.  Pt tolerating them well currently.  Instructed nursing on adjustment and application.  Notify OT if they're  are wearing/fit issues.                      Precautions / Restrictions Precautions Precautions: Fall Precaution Comments: bilateral external rotators stabilizing lower legs/ankles Required Braces or Orthoses: Cervical Brace Cervical Brace: Hard collar Restrictions Weight Bearing Restrictions: Yes RLE Weight Bearing: Non weight bearing LLE Weight Bearing: Non weight bearing   Pertinent Vitals/Pain Pain 2/10 in her ankles at rest in bed           Visit Information  Last OT Received On: 12/19/12 Assistance Needed: +1    Subjective Data  Subjective: When can I get up to a chair. Patient Stated Goal: To get up out of bed.   Prior Functioning  Home Living Lives With: Significant other Available Help at Discharge: Available 24 hours/day Type of Home: Apartment Home Access: Stairs to enter Entrance Stairs-Number of Steps: 10-12 Home Layout: One level Bathroom Shower/Tub: Engineer, manufacturing systems: Standard Bathroom Accessibility: No Home Adaptive Equipment: None Prior Function Level of Independence: Independent Able to Take Stairs?: Yes Driving: Yes Vocation: Full time employment Communication Communication: No difficulties Dominant Hand: Right                End of Session OT - End of Session Activity Tolerance: Patient tolerated treatment well Patient left: in bed Nurse Communication: Other (comment) (use of bilateral footplates and adjustments.)      MCGUIRE,JAMES OTR/L Pager number [phone] 12/19/2012, 12:29 PM
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
## 2012-12-19 NOTE — Progress Notes (Signed)
Assisted pt onto bedpan around 2300 last night, in which she was unable to void after 45 minutes of trying. Bladder scan performed at 0030 with result of 350 mL. I&O cath performed with pure blood returned, no urine. Second I&O performed but the catheter bent and the tip came back out. Third I&O performed by another staff with no urine return. Pediatric catheter, 8 [place], obtained and performed at 0130 with urine return of 600 mL. Urine dark yellow and cloudy with a strong, foul odor. Dr. Janee Morn notified of urine results, order obtained for UA which has been sent. Pt feels full bladder relief at this time. Will continue to monitor and encourage fluid intake.
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
## 2012-12-19 NOTE — Progress Notes (Signed)
Still working with OT/PT. Doing okay.  Lots of pain in her neck.  This patient has been seen and I agree with the findings and treatment plan.  Monique [name]. Gae Bon, MD, FACS [phone] (pager) [phone] (direct pager) Trauma Surgeon
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
## 2012-12-20 LAB — URINE CULTURE: Colony Count: >=100000

## 2012-12-20 MED ORDER — WHITE PETROLATUM GEL
Status: AC
  Filled 2012-12-20: qty 5

## 2012-12-20 MED ORDER — SUMATRIPTAN SUCCINATE 6 MG/0.5ML ~~LOC~~ SOLN
6.0000 mg | SUBCUTANEOUS | Status: DC | PRN
  Administered 2012-12-20 – 2012-12-23 (×6): 6 mg via SUBCUTANEOUS
  Filled 2012-12-20 (×7): qty 0.5

## 2012-12-20 MED ORDER — BISACODYL 5 MG PO TBEC
10.0000 mg | DELAYED_RELEASE_TABLET | Freq: Every day | ORAL | Status: DC
  Administered 2012-12-21 – 2012-12-26 (×5): 10 mg via ORAL
  Filled 2012-12-20 (×5): qty 2
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
## 2012-12-20 NOTE — Progress Notes (Signed)
I have seen and examined the pt and agree with PA-Jeffery's note.

## 2012-12-20 NOTE — Progress Notes (Signed)
Physical Therapy Treatment Patient Details Name: Monique [name] MRN: [id number] DOB: [birth year] Today's Date: 12/20/2012 Time: 4098-1191 PT Time Calculation (min): 24 min  PT Assessment / Plan / Recommendation Comments on Treatment Session  Pt continues to have difficulty tolerating upright position.  Ant-post transfer performed today instead of sliding board.  Pt's significant other present in room and reports this method worked better with less distress from pt.    Follow Up Recommendations  CIR     Does the patient have the potential to tolerate intense rehabilitation     Barriers to Discharge        Equipment Recommendations  Wheelchair (measurements PT)    Recommendations for Other Services    Frequency Min 4X/week   Plan Discharge plan remains appropriate;Frequency remains appropriate    Precautions / Restrictions Precautions Required Braces or Orthoses: Cervical Brace Cervical Brace: Hard collar Restrictions RLE Weight Bearing: Non weight bearing LLE Weight Bearing: Non weight bearing   Pertinent Vitals/Pain 8/10    Mobility  Bed Mobility Supine to Sit: 3: Mod assist;HOB elevated Details for Bed Mobility Assistance: supine to long sit in bed Transfers Transfers: Counselling psychologist Transfer: 1: +2 Total assist Anterior-Posterior Transfers: Patient Percentage: 30% Details for Transfer Assistance: Pt c/o severe neck pain and headache during transfer.  At one point she stated she could not hear me because of the ringing and pain in her head.    Exercises     PT Diagnosis:    PT Problem List:   PT Treatment Interventions:     PT Goals Acute Rehab PT Goals PT Goal: Supine/Side to Sit - Progress: Progressing toward goal PT Goal: Sit to Supine/Side - Progress: Progressing toward goal PT Transfer Goal: Bed to Chair/Chair to Bed - Progress: Progressing toward goal  Visit Information  Last PT Received On: 12/20/12 Assistance  Needed: +2    Subjective Data  Subjective: "I just don't know if I can do this." Patient Stated Goal: home   Cognition  Cognition Overall Cognitive Status: Impaired Area of Impairment: Memory Arousal/Alertness: Awake/alert Orientation Level: Appears intact for tasks assessed Behavior During Session: [facility] for tasks performed Memory: Decreased recall of precautions    Balance     End of Session PT - End of Session Equipment Utilized During Treatment: Cervical collar Activity Tolerance: Patient limited by pain Patient left: in chair;with call bell/phone within reach;with family/visitor present   GP     Ilda Foil 12/20/2012, 1:14 PM  Aida Raider, PT  Office # [phone] Pager [phone]

## 2012-12-21 DIAGNOSIS — G43909 Migraine, unspecified, not intractable, without status migrainosus: Secondary | ICD-10-CM | POA: Diagnosis present

## 2012-12-21 MED ORDER — BETHANECHOL CHLORIDE 25 MG PO TABS
25.0000 mg | ORAL_TABLET | Freq: Three times a day (TID) | ORAL | Status: DC
  Administered 2012-12-21 (×2): 25 mg via ORAL
  Filled 2012-12-21 (×9): qty 1

## 2012-12-21 MED ORDER — POLYETHYLENE GLYCOL 3350 17 G PO PACK
17.0000 g | PACK | Freq: Two times a day (BID) | ORAL | Status: DC
  Administered 2012-12-21 – 2012-12-26 (×7): 17 g via ORAL
  Filled 2012-12-21 (×12): qty 1

## 2012-12-21 NOTE — Progress Notes (Signed)
Doing okay.  Still has headache.  Migraine med helps the most.  This patient has been seen and I agree with the findings and treatment plan.  Marta Lamas. Gae Bon, MD, FACS [phone] (pager) [phone] (direct pager) Trauma Surgeon
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
## 2012-12-22 MED ORDER — LACTATED RINGERS IV SOLN
INTRAVENOUS | Status: DC
  Administered 2012-12-23 – 2012-12-25 (×3): via INTRAVENOUS

## 2012-12-22 MED ORDER — CEFAZOLIN SODIUM-DEXTROSE 2-3 GM-% IV SOLR
2.0000 g | Freq: Once | INTRAVENOUS | Status: AC
  Administered 2012-12-23: 1 g via INTRAVENOUS
  Administered 2012-12-23: 2 g via INTRAVENOUS
  Filled 2012-12-22: qty 50

## 2012-12-22 MED ORDER — DOCUSATE SODIUM 100 MG PO CAPS
100.0000 mg | ORAL_CAPSULE | Freq: Two times a day (BID) | ORAL | Status: DC
  Filled 2012-12-22 (×3): qty 1

## 2012-12-22 NOTE — Progress Notes (Signed)
OT Cancellation Note  Patient Details Name: Monique [name] MRN: [id number] DOB: 04-03-1986   Cancelled Treatment:   OT/PT cancelled this pm due to pt requesting to defer due to increased pain.  Pt for bil. ORIF tomorrow.  Will resume, post surgery.    Jeani Hawking M [phone] 12/22/2012, 2:07 PM

## 2012-12-22 NOTE — Consult Note (Signed)
I have seen and examined the patient. I agree with the findings above.  Michael Handy, MD [facility] [phone] (p)   

## 2012-12-22 NOTE — Clinical Social Work Note (Signed)
Clinical Social Worker continuing to follow patient for support and discharge planning needs.  Patient is scheduled for surgery tomorrow.  Patient hopeful for inpatient rehab prior to transition home.  CSW will continue to remain available as needed.  Macario Golds, [place] [phone]
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
## 2012-12-22 NOTE — Progress Notes (Signed)
I saw and examined the patient with Mr. Paul, communicating the findings and plan noted above.  Michael Handy, MD [facility] [phone] (p)  
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
## 2012-12-23 ENCOUNTER — Inpatient Hospital Stay (HOSPITAL_COMMUNITY): Payer: Medicaid Other

## 2012-12-23 ENCOUNTER — Encounter (HOSPITAL_COMMUNITY): Admission: EM | Disposition: A | Discharge status: Rehabilitation Facility | Payer: Self-pay | Source: Home / Self Care

## 2012-12-23 ENCOUNTER — Encounter (HOSPITAL_COMMUNITY): Payer: Self-pay | Admitting: *Deleted

## 2012-12-23 ENCOUNTER — Encounter (HOSPITAL_COMMUNITY): Payer: Self-pay | Admitting: Anesthesiology

## 2012-12-23 ENCOUNTER — Inpatient Hospital Stay (HOSPITAL_COMMUNITY): Payer: Medicaid Other | Admitting: Anesthesiology

## 2012-12-23 HISTORY — PX: ORIF TIBIA FRACTURE: SHX5416

## 2012-12-23 HISTORY — PX: EXTERNAL FIXATION REMOVAL: SHX5040

## 2012-12-23 LAB — ABO/RH: ABO/RH(D): O POS

## 2012-12-23 LAB — CBC
HCT: 31.2 % — ABNORMAL LOW (ref 36.0–46.0)
Hemoglobin: 10.3 g/dL — ABNORMAL LOW (ref 12.0–15.0)
RBC: 3.91 MIL/uL (ref 3.87–5.11)

## 2012-12-23 LAB — BASIC METABOLIC PANEL
BUN: 14 mg/dL (ref 6–23)
CO2: 27 mEq/L (ref 19–32)
Chloride: 97 mEq/L (ref 96–112)
Glucose, Bld: 95 mg/dL (ref 70–99)
Potassium: 4 mEq/L (ref 3.5–5.1)
Sodium: 135 mEq/L (ref 135–145)

## 2012-12-23 LAB — TYPE AND SCREEN: Antibody Screen: NEGATIVE

## 2012-12-23 SURGERY — OPEN REDUCTION INTERNAL FIXATION (ORIF) TIBIA FRACTURE
Anesthesia: General | Site: Leg Lower | Laterality: Bilateral | Wound class: Clean

## 2012-12-23 MED ORDER — PROPOFOL 10 MG/ML IV BOLUS
INTRAVENOUS | Status: DC | PRN
  Administered 2012-12-23: 110 mg via INTRAVENOUS
  Administered 2012-12-23: 50 mg via INTRAVENOUS

## 2012-12-23 MED ORDER — FENTANYL CITRATE 0.05 MG/ML IJ SOLN
INTRAMUSCULAR | Status: DC | PRN
  Administered 2012-12-23: 100 ug via INTRAVENOUS
  Administered 2012-12-23: 50 ug via INTRAVENOUS
  Administered 2012-12-23: 100 ug via INTRAVENOUS
  Administered 2012-12-23 (×3): 50 ug via INTRAVENOUS
  Administered 2012-12-23 (×2): 100 ug via INTRAVENOUS
  Administered 2012-12-23 (×2): 50 ug via INTRAVENOUS
  Administered 2012-12-23 (×2): 100 ug via INTRAVENOUS
  Administered 2012-12-23: 50 ug via INTRAVENOUS

## 2012-12-23 MED ORDER — ONDANSETRON HCL 4 MG/2ML IJ SOLN: 4.0000 mg | Freq: Four times a day (QID) | INTRAMUSCULAR | Status: DC | PRN

## 2012-12-23 MED ORDER — 0.9 % SODIUM CHLORIDE (POUR BTL) OPTIME
TOPICAL | Status: DC | PRN
  Administered 2012-12-23: 1000 mL

## 2012-12-23 MED ORDER — MIDAZOLAM HCL 5 MG/5ML IJ SOLN
INTRAMUSCULAR | Status: DC | PRN
  Administered 2012-12-23 (×2): 1 mg via INTRAVENOUS

## 2012-12-23 MED ORDER — NALOXONE HCL 0.4 MG/ML IJ SOLN: 0.4000 mg | INTRAMUSCULAR | Status: DC | PRN

## 2012-12-23 MED ORDER — FERROUS SULFATE 325 (65 FE) MG PO TABS
325.0000 mg | ORAL_TABLET | Freq: Three times a day (TID) | ORAL | Status: DC
  Administered 2012-12-24 – 2012-12-26 (×6): 325 mg via ORAL
  Filled 2012-12-23 (×11): qty 1

## 2012-12-23 MED ORDER — ONDANSETRON HCL 4 MG PO TABS: 4.0000 mg | ORAL_TABLET | Freq: Four times a day (QID) | ORAL | Status: DC | PRN

## 2012-12-23 MED ORDER — ALBUMIN HUMAN 5 % IV SOLN
INTRAVENOUS | Status: DC | PRN
  Administered 2012-12-23 (×2): via INTRAVENOUS

## 2012-12-23 MED ORDER — GLYCOPYRROLATE 0.2 MG/ML IJ SOLN
INTRAMUSCULAR | Status: DC | PRN
  Administered 2012-12-23: .4 mg via INTRAVENOUS

## 2012-12-23 MED ORDER — DIPHENHYDRAMINE HCL 12.5 MG/5ML PO ELIX: 12.5000 mg | ORAL_SOLUTION | Freq: Four times a day (QID) | ORAL | Status: DC | PRN

## 2012-12-23 MED ORDER — DIPHENHYDRAMINE HCL 50 MG/ML IJ SOLN: 12.5000 mg | Freq: Four times a day (QID) | INTRAMUSCULAR | Status: DC | PRN

## 2012-12-23 MED ORDER — ROCURONIUM BROMIDE 100 MG/10ML IV SOLN
INTRAVENOUS | Status: DC | PRN
  Administered 2012-12-23: 50 mg via INTRAVENOUS

## 2012-12-23 MED ORDER — ONDANSETRON HCL 4 MG/2ML IJ SOLN
INTRAMUSCULAR | Status: DC | PRN
  Administered 2012-12-23: 4 mg via INTRAVENOUS

## 2012-12-23 MED ORDER — METOCLOPRAMIDE HCL 10 MG PO TABS: 5.0000 mg | ORAL_TABLET | Freq: Three times a day (TID) | ORAL | Status: DC | PRN

## 2012-12-23 MED ORDER — LACTATED RINGERS IV SOLN
INTRAVENOUS | Status: DC | PRN
  Administered 2012-12-23 (×5): via INTRAVENOUS

## 2012-12-23 MED ORDER — OXYCODONE HCL 5 MG/5ML PO SOLN
ORAL | Status: AC
  Administered 2012-12-23: 10 mg
  Filled 2012-12-23: qty 10

## 2012-12-23 MED ORDER — LIDOCAINE HCL (CARDIAC) 20 MG/ML IV SOLN
INTRAVENOUS | Status: DC | PRN
  Administered 2012-12-23: 100 mg via INTRAVENOUS

## 2012-12-23 MED ORDER — HYDROMORPHONE HCL PF 1 MG/ML IJ SOLN
INTRAMUSCULAR | Status: DC | PRN
  Administered 2012-12-23 (×2): 0.5 mg via INTRAVENOUS

## 2012-12-23 MED ORDER — ENOXAPARIN SODIUM 40 MG/0.4ML ~~LOC~~ SOLN
40.0000 mg | SUBCUTANEOUS | Status: DC
  Administered 2012-12-24 – 2012-12-26 (×3): 40 mg via SUBCUTANEOUS
  Filled 2012-12-23 (×4): qty 0.4

## 2012-12-23 MED ORDER — ACETAMINOPHEN 10 MG/ML IV SOLN
1000.0000 mg | Freq: Four times a day (QID) | INTRAVENOUS | Status: AC
  Administered 2012-12-23 – 2012-12-24 (×4): 1000 mg via INTRAVENOUS
  Filled 2012-12-23 (×4): qty 100

## 2012-12-23 MED ORDER — CEFAZOLIN SODIUM 1-5 GM-% IV SOLN
1.0000 g | Freq: Three times a day (TID) | INTRAVENOUS | Status: AC
  Administered 2012-12-23 – 2012-12-24 (×3): 1 g via INTRAVENOUS
  Filled 2012-12-23 (×3): qty 50

## 2012-12-23 MED ORDER — ARTIFICIAL TEARS OP OINT
TOPICAL_OINTMENT | OPHTHALMIC | Status: DC | PRN
  Administered 2012-12-23: 1 via OPHTHALMIC

## 2012-12-23 MED ORDER — SUCCINYLCHOLINE CHLORIDE 20 MG/ML IJ SOLN
INTRAMUSCULAR | Status: DC | PRN
  Administered 2012-12-23: 100 mg via INTRAVENOUS

## 2012-12-23 MED ORDER — NEOSTIGMINE METHYLSULFATE 1 MG/ML IJ SOLN
INTRAMUSCULAR | Status: DC | PRN
  Administered 2012-12-23: 3 mg via INTRAVENOUS

## 2012-12-23 MED ORDER — HYDROMORPHONE 0.3 MG/ML IV SOLN: INTRAVENOUS | Status: DC

## 2012-12-23 MED ORDER — METOCLOPRAMIDE HCL 5 MG/ML IJ SOLN: 5.0000 mg | Freq: Three times a day (TID) | INTRAMUSCULAR | Status: DC | PRN

## 2012-12-23 MED ORDER — FENTANYL CITRATE 0.05 MG/ML IJ SOLN
INTRAMUSCULAR | Status: AC
  Filled 2012-12-23: qty 2

## 2012-12-23 MED ORDER — SODIUM CHLORIDE 0.9 % IJ SOLN: 9.0000 mL | INTRAMUSCULAR | Status: DC | PRN

## 2012-12-23 SURGICAL SUPPLY — 115 items
BANDAGE ELASTIC 4 VELCRO ST LF (GAUZE/BANDAGES/DRESSINGS) ×2 IMPLANT
BANDAGE ELASTIC 6 VELCRO ST LF (GAUZE/BANDAGES/DRESSINGS) ×2 IMPLANT
BANDAGE ESMARK 6X9 LF (GAUZE/BANDAGES/DRESSINGS) ×1 IMPLANT
BANDAGE GAUZE ELAST BULKY 4 IN (GAUZE/BANDAGES/DRESSINGS) ×2 IMPLANT
BIT DRILL 2.5X2.75 QC CALB (BIT) ×4 IMPLANT
BIT DRILL 3.5X5.5 QC CALB (BIT) ×2 IMPLANT
BIT DRILL CALIBRATED 2.7 (BIT) ×2 IMPLANT
BLADE SURG 10 STRL SS (BLADE) ×2 IMPLANT
BLADE SURG 15 STRL LF DISP TIS (BLADE) ×1 IMPLANT
BLADE SURG 15 STRL SS (BLADE) ×1
BLADE SURG ROTATE 9660 (MISCELLANEOUS) IMPLANT
BNDG COHESIVE 4X5 TAN STRL (GAUZE/BANDAGES/DRESSINGS) IMPLANT
BNDG ESMARK 6X9 LF (GAUZE/BANDAGES/DRESSINGS) ×2
BONE CHIP PRESERV 20CC (Bone Implant) ×2 IMPLANT
BRUSH SCRUB DISP (MISCELLANEOUS) ×8 IMPLANT
CLOTH BEACON ORANGE TIMEOUT ST (SAFETY) ×2 IMPLANT
COVER MAYO STAND STRL (DRAPES) IMPLANT
DRAPE C-ARM 42X72 X-RAY (DRAPES) ×2 IMPLANT
DRAPE C-ARMOR (DRAPES) ×2 IMPLANT
DRAPE EXTREMITY BILATERAL (DRAPE) ×2 IMPLANT
DRAPE INCISE IOBAN 66X45 STRL (DRAPES) ×2 IMPLANT
DRAPE ORTHO SPLIT 77X108 STRL (DRAPES) ×1
DRAPE SURG ORHT 6 SPLT 77X108 (DRAPES) ×1 IMPLANT
DRAPE U-SHAPE 47X51 STRL (DRAPES) ×4 IMPLANT
DRSG ADAPTIC 3X8 NADH LF (GAUZE/BANDAGES/DRESSINGS) IMPLANT
DRSG MEPITEL 4X7.2 (GAUZE/BANDAGES/DRESSINGS) ×4 IMPLANT
DRSG PAD ABDOMINAL 8X10 ST (GAUZE/BANDAGES/DRESSINGS) IMPLANT
ELECT REM PT RETURN 9FT ADLT (ELECTROSURGICAL) ×2
ELECTRODE REM PT RTRN 9FT ADLT (ELECTROSURGICAL) ×1 IMPLANT
EVACUATOR 1/8 PVC DRAIN (DRAIN) IMPLANT
EVACUATOR 3/16  PVC DRAIN (DRAIN)
EVACUATOR 3/16 PVC DRAIN (DRAIN) IMPLANT
GLOVE BIO SURGEON STRL SZ7.5 (GLOVE) ×4 IMPLANT
GLOVE BIO SURGEON STRL SZ8 (GLOVE) ×4 IMPLANT
GLOVE BIO SURGEON STRL SZ8.5 (GLOVE) ×8 IMPLANT
GLOVE BIOGEL PI IND STRL 7.0 (GLOVE) ×1 IMPLANT
GLOVE BIOGEL PI IND STRL 7.5 (GLOVE) ×1 IMPLANT
GLOVE BIOGEL PI IND STRL 8 (GLOVE) ×1 IMPLANT
GLOVE BIOGEL PI INDICATOR 7.0 (GLOVE) ×1
GLOVE BIOGEL PI INDICATOR 7.5 (GLOVE) ×1
GLOVE BIOGEL PI INDICATOR 8 (GLOVE) ×1
GLOVE SURG SS PI 6.5 STRL IVOR (GLOVE) ×2 IMPLANT
GLOVE SURG SS PI 8.5 STRL IVOR (GLOVE) ×4
GLOVE SURG SS PI 8.5 STRL STRW (GLOVE) ×4 IMPLANT
GOWN PREVENTION PLUS XLARGE (GOWN DISPOSABLE) ×4 IMPLANT
GOWN STRL NON-REIN LRG LVL3 (GOWN DISPOSABLE) ×2 IMPLANT
GOWN STRL REIN XL XLG (GOWN DISPOSABLE) ×6 IMPLANT
IMMOBILIZER KNEE 22 UNIV (SOFTGOODS) IMPLANT
K-WIRE ACE 1.6X6 (WIRE) ×18
KIT BASIN OR (CUSTOM PROCEDURE TRAY) ×2 IMPLANT
KIT ROOM TURNOVER OR (KITS) ×2 IMPLANT
KWIRE ACE 1.6X6 (WIRE) ×9 IMPLANT
MANIFOLD NEPTUNE II (INSTRUMENTS) ×2 IMPLANT
NDL SUT .5 MAYO 1.404X.05X (NEEDLE) IMPLANT
NEEDLE 22X1 1/2 (OR ONLY) (NEEDLE) IMPLANT
NEEDLE MAYO TAPER (NEEDLE)
NS IRRIG 1000ML POUR BTL (IV SOLUTION) ×2 IMPLANT
PACK ORTHO EXTREMITY (CUSTOM PROCEDURE TRAY) ×2 IMPLANT
PAD ARMBOARD 7.5X6 YLW CONV (MISCELLANEOUS) ×4 IMPLANT
PAD CAST 4YDX4 CTTN HI CHSV (CAST SUPPLIES) ×1 IMPLANT
PADDING CAST ABS 4INX4YD NS (CAST SUPPLIES) ×2
PADDING CAST ABS 6INX4YD NS (CAST SUPPLIES) ×2
PADDING CAST ABS COTTON 4X4 ST (CAST SUPPLIES) ×2 IMPLANT
PADDING CAST ABS COTTON 6X4 NS (CAST SUPPLIES) ×2 IMPLANT
PADDING CAST COTTON 4X4 STRL (CAST SUPPLIES) ×1
PADDING CAST COTTON 6X4 STRL (CAST SUPPLIES) ×2 IMPLANT
PLATE 6H LT DIST ANTLAT TIB (Plate) ×2 IMPLANT
PLATE 6H RT DIST ANTLAT TIB (Plate) ×1 IMPLANT
PLATE ACE 100DEG 3HOLE (Plate) ×2 IMPLANT
PLATE ANTLAT CNTR NAR 114X6 (Plate) ×1 IMPLANT
SCREW 3.5MM CORT LP 34MM (Screw) ×2 IMPLANT
SCREW CANC LAG 4X40 (Screw) ×2 IMPLANT
SCREW CORT 3.5X40 (Screw) ×2 IMPLANT
SCREW CORT FT 32X3.5XNONLOCK (Screw) ×2 IMPLANT
SCREW CORTICAL 3.5MM  28MM (Screw) ×3 IMPLANT
SCREW CORTICAL 3.5MM  30MM (Screw) ×3 IMPLANT
SCREW CORTICAL 3.5MM  32MM (Screw) ×2 IMPLANT
SCREW CORTICAL 3.5MM  44MM (Screw) ×1 IMPLANT
SCREW CORTICAL 3.5MM 28MM (Screw) ×3 IMPLANT
SCREW CORTICAL 3.5MM 30MM (Screw) ×3 IMPLANT
SCREW CORTICAL 3.5MM 38MM (Screw) ×6 IMPLANT
SCREW CORTICAL 3.5MM 44MM (Screw) ×1 IMPLANT
SCREW LOCK CORT STAR 3.5X30 (Screw) ×2 IMPLANT
SCREW LOCK CORT STAR 3.5X32 (Screw) ×4 IMPLANT
SCREW LOCK CORT STAR 3.5X38 (Screw) ×4 IMPLANT
SCREW LOCK CORT STAR 3.5X42 (Screw) ×2 IMPLANT
SCREW LOW PROFILE 3.5MMX42 (Screw) ×10 IMPLANT
SCRUB BETADINE 4OZ XXX (MISCELLANEOUS) ×4 IMPLANT
SOAP 2 % CHG 4 OZ (WOUND CARE) ×2 IMPLANT
SOLUTION BETADINE 4OZ (MISCELLANEOUS) ×6 IMPLANT
SPLINT PLASTER CAST XFAST 5X30 (CAST SUPPLIES) ×1 IMPLANT
SPLINT PLASTER XFAST SET 5X30 (CAST SUPPLIES) ×1
SPONGE GAUZE 4X4 12PLY (GAUZE/BANDAGES/DRESSINGS) ×2 IMPLANT
SPONGE LAP 18X18 X RAY DECT (DISPOSABLE) ×8 IMPLANT
SPRAY BETADINE AEROSOL XXX (MISCELLANEOUS) ×2 IMPLANT
STAPLER VISISTAT 35W (STAPLE) ×2 IMPLANT
STOCKINETTE IMPERVIOUS LG (DRAPES) IMPLANT
SUCTION FRAZIER TIP 10 FR DISP (SUCTIONS) ×2 IMPLANT
SUT ETHILON 1 TP 1 60 (SUTURE) ×2 IMPLANT
SUT ETHILON 2 0 FS 18 (SUTURE) ×4 IMPLANT
SUT ETHILON 3 0 PS 1 (SUTURE) ×2 IMPLANT
SUT PROLENE 0 CT 2 (SUTURE) IMPLANT
SUT VIC AB 0 CT1 27 (SUTURE)
SUT VIC AB 0 CT1 27XBRD ANBCTR (SUTURE) IMPLANT
SUT VIC AB 1 CT1 27 (SUTURE)
SUT VIC AB 1 CT1 27XBRD ANBCTR (SUTURE) IMPLANT
SUT VIC AB 2-0 CT1 27 (SUTURE) ×2
SUT VIC AB 2-0 CT1 TAPERPNT 27 (SUTURE) ×2 IMPLANT
SYR 20ML ECCENTRIC (SYRINGE) IMPLANT
TOWEL OR 17X24 6PK STRL BLUE (TOWEL DISPOSABLE) ×2 IMPLANT
TOWEL OR 17X26 10 PK STRL BLUE (TOWEL DISPOSABLE) ×4 IMPLANT
TRAY FOLEY CATH 14FR (SET/KITS/TRAYS/PACK) ×2 IMPLANT
TUBE CONNECTING 12X1/4 (SUCTIONS) ×2 IMPLANT
WATER STERILE IRR 1000ML POUR (IV SOLUTION) IMPLANT
YANKAUER SUCT BULB TIP NO VENT (SUCTIONS) ×2 IMPLANT

## 2012-12-23 NOTE — Transfer of Care (Signed)
Immediate Anesthesia Transfer of Care Note  Patient: Monique [name]  Procedure(s) Performed: Procedure(s): OPEN REDUCTION INTERNAL FIXATION (ORIF) BILATERAL DISTAL TIBIA FRACTURE (Bilateral) REMOVAL EXTERNAL FIXATION LEG (Bilateral)  Patient Location: PACU  Anesthesia Type:General  Level of Consciousness: awake, alert , confused and responds to stimulation  Airway & Oxygen Therapy: Patient Spontanous Breathing and Patient connected to nasal cannula oxygen  Post-op Assessment: Report given to PACU RN, Post -op Vital signs reviewed and stable and Patient moving all extremities  Post vital signs: Reviewed and stable  Complications: No apparent anesthesia complications

## 2012-12-23 NOTE — Preoperative (Signed)
Beta Blockers   Reason not to administer Beta Blockers:Not Applicable 

## 2012-12-23 NOTE — Anesthesia Postprocedure Evaluation (Signed)
Anesthesia Post Note  Patient: Monique [name]  Procedure(s) Performed: Procedure(s) (LRB): OPEN REDUCTION INTERNAL FIXATION (ORIF) BILATERAL DISTAL TIBIA FRACTURE (Bilateral) REMOVAL EXTERNAL FIXATION LEG (Bilateral)  Anesthesia type: general  Patient location: PACU  Post pain: Pain level controlled  Post assessment: Patient's Cardiovascular Status Stable  Last Vitals:  Filed Vitals:   12/23/12 1611  BP: 133/80  Pulse: 99  Temp: 36.3 C  Resp: 16    Post vital signs: Reviewed and stable  Level of consciousness: sedated  Complications: No apparent anesthesia complications

## 2012-12-23 NOTE — Progress Notes (Signed)
Seen and agreed. Will schedule reeval for Wednesday if appropriate 12/23/2012 Fredrich Birks PTA [phone] pager [phone] office

## 2012-12-23 NOTE — Brief Op Note (Signed)
12/14/2012 - 12/23/2012  3:22 PM  PATIENT:  Monique [name]  27 y.o. female  PRE-OPERATIVE DIAGNOSIS:  Bilateral Pilon Fractures  POST-OPERATIVE DIAGNOSIS:  Bilateral Pilon Fractures  PROCEDURE:  Procedure(s): OPEN REDUCTION INTERNAL FIXATION (ORIF) BILATERAL DISTAL TIBIA FRACTURE (Bilateral) REMOVAL EXTERNAL FIXATION LEG (Bilateral) Repair right syndesmosis  SURGEON:  Surgeon(s) and Role:    * Budd Palmer, MD - Primary  PHYSICIAN ASSISTANT: [name], [facility]  ANESTHESIA:   general  EBL:  Total I/O In: 3700 [I.V.:3200; IV Piggyback:500] Out: 720 [Urine:570; Other:100; Blood:50]  BLOOD ADMINISTERED:none  DRAINS: none   LOCAL MEDICATIONS USED:  NONE  SPECIMEN:  No Specimen  DISPOSITION OF SPECIMEN:  N/A  COUNTS:  YES  TOURNIQUET:    DICTATION: .Other Dictation: Dictation Number [id number]  PLAN OF CARE: Admit to inpatient   PATIENT DISPOSITION:  PACU - hemodynamically stable.   Delay start of Pharmacological VTE agent (>24hrs) due to surgical blood loss or risk of bleeding: no

## 2012-12-23 NOTE — Progress Notes (Signed)
UR completed 

## 2012-12-23 NOTE — Anesthesia Preprocedure Evaluation (Addendum)
Anesthesia Evaluation  Patient identified by MRN, date of birth, ID band Patient awake    Reviewed: Allergy & Precautions, H&P , NPO status , Patient's Chart, lab work & pertinent test results  Airway Mallampati: IV  Neck ROM: Limited    Dental  (+) Teeth Intact, Loose and Dental Advisory Given,    Pulmonary neg pulmonary ROS,  breath sounds clear to auscultation        Cardiovascular + Valvular Problems/Murmurs Rhythm:Regular Rate:Normal     Neuro/Psych  Headaches,    GI/Hepatic negative GI ROS, Neg liver ROS,   Endo/Other  negative endocrine ROS  Renal/GU negative Renal ROS     Musculoskeletal   Abdominal   Peds  Hematology negative hematology ROS (+)   Anesthesia Other Findings   Reproductive/Obstetrics                           Anesthesia Physical Anesthesia Plan  ASA: III  Anesthesia Plan: General   Post-op Pain Management:    Induction: Intravenous  Airway Management Planned: Oral ETT and Video Laryngoscope Planned  Additional Equipment:   Intra-op Plan:   Post-operative Plan: Extubation in OR  Informed Consent: I have reviewed the patients History and Physical, chart, labs and discussed the procedure including the risks, benefits and alternatives for the proposed anesthesia with the patient or authorized representative who has indicated his/her understanding and acceptance.   Dental advisory given  Plan Discussed with: Surgeon and CRNA  Anesthesia Plan Comments:        Anesthesia Quick Evaluation
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
## 2012-12-23 NOTE — Progress Notes (Signed)
SLP Cancellation Note  Patient Details Name: Monique [name] MRN: [id number] DOB: Apr 21, 1986   Cancelled treatment:       Reason Eval/Treat Not Completed: Patient at procedure or test/unavailable (in OR. Will f/u 3/19. )  Ferdinand Lango MA, CCC-SLP [phone]  [name] Meryl 12/23/2012, 11:49 AM

## 2012-12-24 DIAGNOSIS — S82899A Other fracture of unspecified lower leg, initial encounter for closed fracture: Secondary | ICD-10-CM

## 2012-12-24 DIAGNOSIS — S069X9A Unspecified intracranial injury with loss of consciousness of unspecified duration, initial encounter: Secondary | ICD-10-CM

## 2012-12-24 DIAGNOSIS — S12100A Unspecified displaced fracture of second cervical vertebra, initial encounter for closed fracture: Secondary | ICD-10-CM

## 2012-12-24 LAB — CBC
MCH: 26.1 pg (ref 26.0–34.0)
MCHC: 33.1 g/dL (ref 30.0–36.0)
MCV: 78.8 fL (ref 78.0–100.0)
Platelets: 415 10*3/uL — ABNORMAL HIGH (ref 150–400)
RBC: 3.3 MIL/uL — ABNORMAL LOW (ref 3.87–5.11)
RDW: 13.3 % (ref 11.5–15.5)

## 2012-12-24 LAB — BASIC METABOLIC PANEL
BUN: 9 mg/dL (ref 6–23)
CO2: 26 mEq/L (ref 19–32)
Calcium: 8.8 mg/dL (ref 8.4–10.5)
Creatinine, Ser: 0.62 mg/dL (ref 0.50–1.10)
GFR calc non Af Amer: >90 mL/min (ref >90)
Glucose, Bld: 106 mg/dL — ABNORMAL HIGH (ref 70–99)
Sodium: 137 mEq/L (ref 135–145)
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
## 2012-12-24 NOTE — Progress Notes (Signed)
Occupational Therapy Treatment Patient Details Name: Monique [name] MRN: [id number] DOB: December 10, 1985 Today's Date: 12/24/2012 Time: 4098-1191 OT Time Calculation (min): 17 min  OT Assessment / Plan / Recommendation Comments on Treatment Session Performed anterior transfer recliner to bed with +3 assist.  Pt. with limted ability to push through UEs due to increased pain with and audible "popping" of sternum.  Pt is motivated    Follow Up Recommendations  CIR    Barriers to Discharge       Equipment Recommendations  Other (comment);Wheelchair (measurements OT)    Recommendations for Other Services    Frequency Min 2X/week   Plan Discharge plan remains appropriate    Precautions / Restrictions Precautions Precautions: Fall Precaution Comments: C2 and sternal fractures.  MD cleared pt for Full WB, pushing and pulling through bilateral UEs.   Required Braces or Orthoses: Cervical Brace Cervical Brace: Hard collar Restrictions Weight Bearing Restrictions: Yes RLE Weight Bearing: Non weight bearing LLE Weight Bearing: Non weight bearing   Pertinent Vitals/Pain     ADL  Toilet Transfer: Performed;+2 Total assistance Toilet Transfer: Patient Percentage: 20% Toilet Transfer Method: Other (comment) (anterior scoot transfer ) Toilet Transfer Equipment:  (redcliner to bed)    OT Diagnosis:    OT Problem List:   OT Treatment Interventions:     OT Goals ADL Goals ADL Goal: Toilet Transfer - Progress: Progressing toward goals  Visit Information  Last OT Received On: 12/31/12 Assistance Needed: +2 PT/OT Co-Evaluation/Treatment: Yes    Subjective Data      Prior Functioning       Cognition  Cognition Overall Cognitive Status: Appears within functional limits for tasks assessed/performed Arousal/Alertness: Awake/alert Orientation Level: Appears intact for tasks assessed Behavior During Session: [facility] for tasks performed    Mobility  Bed Mobility Bed Mobility: Sit to  Supine Sit to Supine: HOB elevated;2: Max assist Details for Bed Mobility Assistance: long sitting to supine with HOB elevated Transfers Sit to Stand: Not tested (comment) Stand to Sit: Not tested (comment) Details for Transfer Assistance: Pt performed anterior transfer from recliner to bed with +3 assist (pt  ~20%).  Assisted with reciprocal scooting of hips with support at bil. LEs and at shoulders/back to limit pt's need to push through UEs as this causes sternal pain and audible "popping" noise    Exercises      Balance     End of Session OT - End of Session Equipment Utilized During Treatment: Cervical collar Activity Tolerance: Patient limited by pain Patient left: in bed;with call bell/phone within reach;with family/visitor present  GO     Harshith Pursell, Ursula Alert M 12/24/2012, 2:57 PM

## 2012-12-24 NOTE — Progress Notes (Signed)
Physical Therapy Treatment Patient Details Name: Monique [name] MRN: [id number] DOB: October 26, 1985 Today's Date: 12/24/2012 Time: 4098-1191 PT Time Calculation (min): 60 min  PT Assessment / Plan / Recommendation Comments on Treatment Session  Upright position continue to increase pt pain in neck.  Pt able to perform SLR with each LE independently after practice with PT assist.  Pt presents with limited use of UEs secondary to pain and instability of sternum.      Follow Up Recommendations  CIR     Does the patient have the potential to tolerate intense rehabilitation     Barriers to Discharge        Equipment Recommendations  Wheelchair (measurements PT)    Recommendations for Other Services    Frequency Min 4X/week   Plan Discharge plan remains appropriate;Frequency remains appropriate    Precautions / Restrictions Precautions Precautions: Fall Precaution Comments: C2 and sternal fractures.  MD cleared pt for Full WB, pushing and pulling through bilateral UEs.   Required Braces or Orthoses: Cervical Brace Cervical Brace: Hard collar Restrictions Weight Bearing Restrictions: Yes RLE Weight Bearing: Non weight bearing LLE Weight Bearing: Non weight bearing   Pertinent Vitals/Pain 8/10 pain in neck and chest (sternum).  Pt medicated 1 hour prior to start of session.  Repositioned pt for comfort.      Mobility  Bed Mobility Bed Mobility: Supine to Sit Supine to Sit: 3: Mod assist;HOB elevated Details for Bed Mobility Assistance: supine to long sit in bed Transfers Transfers: Anterior-Posterior Transfer Sit to Stand: Not tested (comment) Stand to Sit: Not tested (comment) Anterior-Posterior Transfer: 1: +2 Total assist Anterior-Posterior Transfers: Patient Percentage: 40% Lateral/Scoot Transfers: Not tested (comment) Details for Transfer Assistance: Pt requires assistance to manage bilateral LEs and to support upper back and neck in long sitting.  Step by step  instruction to for posterior slide transfer from bed to chair. +2 total assist to slide pt's hips secondary to pain and instability (audible popping) in sternum and increased neck pain when pushing on UEs.   Ambulation/Gait Ambulation/Gait Assistance: Not tested (comment) Stairs: No Wheelchair Mobility Wheelchair Mobility: No    Exercises     PT Diagnosis:    PT Problem List:   PT Treatment Interventions:     PT Goals Acute Rehab PT Goals PT Goal Formulation: With patient Time For Goal Achievement: 01/02/13 Potential to Achieve Goals: Fair Pt will go Supine/Side to Sit: with supervision;with HOB 0 degrees Pt will go Sit to Supine/Side: with supervision;with HOB 0 degrees Pt will Transfer Bed to Chair/Chair to Bed: with min assist PT Transfer Goal: Bed to Chair/Chair to Bed - Progress: Progressing toward goal  Visit Information  Last PT Received On: 12/24/12 Assistance Needed: +2    Subjective Data  Subjective: Pt c/o migrain headache Patient Stated Goal: home   Cognition  Cognition Overall Cognitive Status: Appears within functional limits for tasks assessed/performed Arousal/Alertness: Awake/alert Orientation Level: Appears intact for tasks assessed Behavior During Session: [facility] for tasks performed    Balance  Balance Balance Assessed: Yes Static Sitting Balance Static Sitting - Balance Support: Bilateral upper extremity supported Static Sitting - Level of Assistance: 4: Min assist  End of Session PT - End of Session Equipment Utilized During Treatment: Cervical collar Activity Tolerance: Patient limited by pain Patient left: in chair;with call bell/phone within reach;with family/visitor present Nurse Communication: Mobility status;Need for lift equipment;Patient requests pain meds;Weight bearing status;Other (comment)   GP     Monique [name] 12/24/2012, 2:02 PM Monique [name]  DPT [phone]

## 2012-12-24 NOTE — Clinical Social Work Note (Signed)
Clinical Social Worker met with patient and patient boyfriend at bedside for continued support and discuss patient plans at discharge.  Patient and boyfriend both state that patient has communicated with her father in hopes of making arrangements to stay with him following a hopeful inpatient rehab stay.  Patient expressed concerns regarding insurance coverage and financial burdens - CSW answered questions and encouraged patient to notify patient accounts once billing has started.  CSW will remain involved for support and to continue discussion regarding patient discharge plans.  Macario Golds, [place] [phone]

## 2012-12-25 ENCOUNTER — Encounter (HOSPITAL_COMMUNITY): Payer: Self-pay | Admitting: Orthopedic Surgery

## 2012-12-25 LAB — CBC
MCV: 77.6 fL — ABNORMAL LOW (ref 78.0–100.0)
Platelets: 394 10*3/uL (ref 150–400)
RBC: 3.26 MIL/uL — ABNORMAL LOW (ref 3.87–5.11)
RDW: 13.7 % (ref 11.5–15.5)
WBC: 11.8 10*3/uL — ABNORMAL HIGH (ref 4.0–10.5)

## 2012-12-25 LAB — BASIC METABOLIC PANEL
CO2: 27 mEq/L (ref 19–32)
Calcium: 8.7 mg/dL (ref 8.4–10.5)
Creatinine, Ser: 0.54 mg/dL (ref 0.50–1.10)
GFR calc non Af Amer: >90 mL/min (ref >90)
Sodium: 135 mEq/L (ref 135–145)
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
## 2012-12-25 NOTE — Progress Notes (Signed)
No bed available CIR today.  Discussed w/ pt & her boyfriend.  Will f/up to see if able to admit later this week.  Did discuss w/ pt's CM, Marcelino Duster, as well-recommended looking into a backup d/c plan, just in case.  [phone]
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
## 2012-12-25 NOTE — Progress Notes (Signed)
Seen and agree with plan.  Getting on bedpan.  HA better.

## 2012-12-26 ENCOUNTER — Inpatient Hospital Stay (HOSPITAL_COMMUNITY)
Admission: RE | Admit: 2012-12-26 | Discharge: 2013-01-03 | DRG: 945 | Disposition: A | Discharge status: Home / Self Care | Payer: No Typology Code available for payment source | Source: Intra-hospital | Attending: Physical Medicine & Rehabilitation | Admitting: Physical Medicine & Rehabilitation

## 2012-12-26 ENCOUNTER — Encounter (HOSPITAL_COMMUNITY): Payer: Self-pay | Admitting: Internal Medicine

## 2012-12-26 DIAGNOSIS — S12100A Unspecified displaced fracture of second cervical vertebra, initial encounter for closed fracture: Secondary | ICD-10-CM

## 2012-12-26 DIAGNOSIS — Z5189 Encounter for other specified aftercare: Principal | ICD-10-CM

## 2012-12-26 DIAGNOSIS — K5900 Constipation, unspecified: Secondary | ICD-10-CM | POA: Diagnosis present

## 2012-12-26 DIAGNOSIS — S82899A Other fracture of unspecified lower leg, initial encounter for closed fracture: Secondary | ICD-10-CM | POA: Diagnosis present

## 2012-12-26 DIAGNOSIS — S060X0A Concussion without loss of consciousness, initial encounter: Secondary | ICD-10-CM | POA: Diagnosis present

## 2012-12-26 DIAGNOSIS — S060X9A Concussion with loss of consciousness of unspecified duration, initial encounter: Secondary | ICD-10-CM

## 2012-12-26 DIAGNOSIS — M62838 Other muscle spasm: Secondary | ICD-10-CM | POA: Diagnosis present

## 2012-12-26 DIAGNOSIS — S069X9A Unspecified intracranial injury with loss of consciousness of unspecified duration, initial encounter: Secondary | ICD-10-CM

## 2012-12-26 DIAGNOSIS — D62 Acute posthemorrhagic anemia: Secondary | ICD-10-CM | POA: Diagnosis present

## 2012-12-26 DIAGNOSIS — S2220XA Unspecified fracture of sternum, initial encounter for closed fracture: Secondary | ICD-10-CM | POA: Diagnosis present

## 2012-12-26 DIAGNOSIS — S82891A Other fracture of right lower leg, initial encounter for closed fracture: Secondary | ICD-10-CM

## 2012-12-26 DIAGNOSIS — S82409A Unspecified fracture of shaft of unspecified fibula, initial encounter for closed fracture: Secondary | ICD-10-CM | POA: Diagnosis present

## 2012-12-26 DIAGNOSIS — Z87891 Personal history of nicotine dependence: Secondary | ICD-10-CM

## 2012-12-26 DIAGNOSIS — F411 Generalized anxiety disorder: Secondary | ICD-10-CM | POA: Diagnosis present

## 2012-12-26 DIAGNOSIS — S27329A Contusion of lung, unspecified, initial encounter: Secondary | ICD-10-CM | POA: Diagnosis present

## 2012-12-26 DIAGNOSIS — S82209A Unspecified fracture of shaft of unspecified tibia, initial encounter for closed fracture: Secondary | ICD-10-CM | POA: Diagnosis present

## 2012-12-26 LAB — PROTIME-INR
INR: 1.1 (ref 0.00–1.49)
Prothrombin Time: 14.1 seconds (ref 11.6–15.2)

## 2012-12-26 LAB — BASIC METABOLIC PANEL
Calcium: 9 mg/dL (ref 8.4–10.5)
Chloride: 101 mEq/L (ref 96–112)
Creatinine, Ser: 0.54 mg/dL (ref 0.50–1.10)
GFR calc Af Amer: >90 mL/min (ref >90)
Sodium: 137 mEq/L (ref 135–145)

## 2012-12-26 MED ORDER — TIZANIDINE HCL 4 MG PO TABS
4.0000 mg | ORAL_TABLET | Freq: Three times a day (TID) | ORAL | Status: DC | PRN
  Administered 2012-12-26 – 2012-12-28 (×4): 4 mg via ORAL
  Filled 2012-12-26 (×5): qty 1

## 2012-12-26 MED ORDER — PROCHLORPERAZINE 25 MG RE SUPP
12.5000 mg | Freq: Four times a day (QID) | RECTAL | Status: DC | PRN
  Filled 2012-12-26: qty 1

## 2012-12-26 MED ORDER — GUAIFENESIN-DM 100-10 MG/5ML PO SYRP: 5.0000 mL | ORAL_SOLUTION | Freq: Four times a day (QID) | ORAL | Status: DC | PRN

## 2012-12-26 MED ORDER — PROCHLORPERAZINE MALEATE 5 MG PO TABS
5.0000 mg | ORAL_TABLET | Freq: Four times a day (QID) | ORAL | Status: DC | PRN
  Filled 2012-12-26: qty 2

## 2012-12-26 MED ORDER — BISACODYL 10 MG RE SUPP: 10.0000 mg | Freq: Every day | RECTAL | Status: DC | PRN

## 2012-12-26 MED ORDER — COUMADIN BOOK
Freq: Once | Status: AC
  Administered 2012-12-26: 12:00:00
  Filled 2012-12-26: qty 1

## 2012-12-26 MED ORDER — WARFARIN SODIUM 7.5 MG PO TABS
7.5000 mg | ORAL_TABLET | Freq: Once | ORAL | Status: DC
  Filled 2012-12-26: qty 1

## 2012-12-26 MED ORDER — OXYCODONE HCL 5 MG PO TABS
15.0000 mg | ORAL_TABLET | Freq: Two times a day (BID) | ORAL | Status: DC
  Administered 2012-12-27 – 2012-12-30 (×4): 10 mg via ORAL
  Filled 2012-12-26: qty 2
  Filled 2012-12-26: qty 3
  Filled 2012-12-26: qty 2
  Filled 2012-12-26: qty 3
  Filled 2012-12-26 (×2): qty 2

## 2012-12-26 MED ORDER — ENOXAPARIN SODIUM 40 MG/0.4ML ~~LOC~~ SOLN
40.0000 mg | SUBCUTANEOUS | Status: DC
  Administered 2012-12-27 – 2012-12-30 (×4): 40 mg via SUBCUTANEOUS
  Filled 2012-12-26 (×6): qty 0.4

## 2012-12-26 MED ORDER — PROCHLORPERAZINE EDISYLATE 5 MG/ML IJ SOLN
5.0000 mg | Freq: Four times a day (QID) | INTRAMUSCULAR | Status: DC | PRN
  Filled 2012-12-26: qty 2

## 2012-12-26 MED ORDER — TRAZODONE HCL 50 MG PO TABS
25.0000 mg | ORAL_TABLET | Freq: Every evening | ORAL | Status: DC | PRN
  Filled 2012-12-26: qty 1

## 2012-12-26 MED ORDER — WARFARIN VIDEO: Freq: Once | Status: DC

## 2012-12-26 MED ORDER — TRAMADOL HCL 50 MG PO TABS
100.0000 mg | ORAL_TABLET | Freq: Four times a day (QID) | ORAL | Status: DC
  Administered 2012-12-26 – 2013-01-03 (×28): 100 mg via ORAL
  Filled 2012-12-26 (×30): qty 2

## 2012-12-26 MED ORDER — ALUM & MAG HYDROXIDE-SIMETH 200-200-20 MG/5ML PO SUSP: 30.0000 mL | ORAL | Status: DC | PRN

## 2012-12-26 MED ORDER — SUMATRIPTAN SUCCINATE 6 MG/0.5ML ~~LOC~~ SOLN
6.0000 mg | SUBCUTANEOUS | Status: DC | PRN
  Administered 2012-12-29: 6 mg via SUBCUTANEOUS
  Filled 2012-12-26 (×2): qty 0.5

## 2012-12-26 MED ORDER — WARFARIN - PHARMACIST DOSING INPATIENT: Freq: Every day | Status: DC

## 2012-12-26 MED ORDER — MAGNESIUM CITRATE PO SOLN
1.0000 | Freq: Once | ORAL | Status: AC
  Administered 2012-12-26: 1 via ORAL
  Filled 2012-12-26 (×2): qty 296

## 2012-12-26 MED ORDER — FLEET ENEMA 7-19 GM/118ML RE ENEM: 1.0000 | ENEMA | Freq: Once | RECTAL | Status: AC | PRN

## 2012-12-26 MED ORDER — POLYETHYLENE GLYCOL 3350 17 G PO PACK
17.0000 g | PACK | Freq: Two times a day (BID) | ORAL | Status: DC
  Administered 2012-12-26 – 2013-01-03 (×16): 17 g via ORAL
  Filled 2012-12-26 (×18): qty 1

## 2012-12-26 MED ORDER — DIPHENHYDRAMINE HCL 12.5 MG/5ML PO ELIX: 12.5000 mg | ORAL_SOLUTION | Freq: Four times a day (QID) | ORAL | Status: DC | PRN

## 2012-12-26 MED ORDER — OXYCODONE HCL 5 MG PO TABS
10.0000 mg | ORAL_TABLET | ORAL | Status: DC | PRN
  Administered 2012-12-26 – 2012-12-30 (×11): 10 mg via ORAL
  Administered 2012-12-31: 15 mg via ORAL
  Filled 2012-12-26: qty 3
  Filled 2012-12-26 (×9): qty 2
  Filled 2012-12-26: qty 3
  Filled 2012-12-26: qty 2

## 2012-12-26 MED ORDER — ACETAMINOPHEN 325 MG PO TABS: 325.0000 mg | ORAL_TABLET | ORAL | Status: DC | PRN

## 2012-12-26 MED ORDER — BISACODYL 5 MG PO TBEC
10.0000 mg | DELAYED_RELEASE_TABLET | Freq: Every day | ORAL | Status: DC
  Administered 2012-12-27 – 2013-01-03 (×8): 10 mg via ORAL
  Filled 2012-12-26 (×4): qty 2
  Filled 2012-12-26 (×2): qty 1
  Filled 2012-12-26 (×3): qty 2

## 2012-12-26 MED ORDER — FERROUS SULFATE 325 (65 FE) MG PO TABS
325.0000 mg | ORAL_TABLET | Freq: Three times a day (TID) | ORAL | Status: DC
  Administered 2012-12-26 – 2013-01-03 (×23): 325 mg via ORAL
  Filled 2012-12-26 (×26): qty 1

## 2012-12-26 MED ORDER — WARFARIN SODIUM 7.5 MG PO TABS
7.5000 mg | ORAL_TABLET | Freq: Once | ORAL | Status: AC
  Administered 2012-12-26: 7.5 mg via ORAL
  Filled 2012-12-26: qty 1

## 2012-12-26 MED ORDER — METHOCARBAMOL 500 MG PO TABS: 500.0000 mg | ORAL_TABLET | Freq: Four times a day (QID) | ORAL | Status: DC | PRN

## 2012-12-26 MED ORDER — NALOXONE HCL 0.4 MG/ML IJ SOLN: 0.4000 mg | INTRAMUSCULAR | Status: DC | PRN

## 2012-12-26 MED ORDER — CHLORHEXIDINE GLUCONATE 0.12 % MT SOLN
15.0000 mL | Freq: Two times a day (BID) | OROMUCOSAL | Status: DC
  Administered 2012-12-26 – 2013-01-03 (×9): 15 mL via OROMUCOSAL
  Filled 2012-12-26 (×18): qty 15
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
## 2012-12-26 NOTE — Progress Notes (Signed)
Pt arrived to room 4002 from 4 no. via bed, accompanied by family. Alert and oriented x4, VSS; reports BLE pain, 8/10; scheduled Ultram given, effective. Pt reports Lg. BM priort o rehab admit.  Oriented to room and rehab process.

## 2012-12-26 NOTE — Progress Notes (Signed)
Admitting patient to CIR today. Pt's MD reports pt is medically stable to d/c to CIR and pt is very eager to come. Pt's RN and CSW notified. For questions call [phone].
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
## 2012-12-26 NOTE — Clinical Social Work Note (Signed)
Clinical Social Worker continuing to follow patient and family for support and discharge planning needs.  Per chart, patient is transferring to inpatient rehab today.  Patient and patient family are prepared and agreeable with transfer.    Clinical Social Worker will sign off for now as social work intervention is no longer needed. Please consult [place] again if new need arises.  Macario Golds, [place] [phone]
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
## 2012-12-26 NOTE — Progress Notes (Signed)
This patient has been seen and I agree with the findings and treatment plan.  Ray Glacken O. Ameera Tigue, III, MD, FACS [phone] (pager) [phone] (direct pager) Trauma Surgeon  

## 2012-12-27 ENCOUNTER — Inpatient Hospital Stay (HOSPITAL_COMMUNITY): Payer: No Typology Code available for payment source | Admitting: Physical Therapy

## 2012-12-27 ENCOUNTER — Inpatient Hospital Stay (HOSPITAL_COMMUNITY): Payer: No Typology Code available for payment source

## 2012-12-27 ENCOUNTER — Inpatient Hospital Stay (HOSPITAL_COMMUNITY): Payer: No Typology Code available for payment source | Admitting: *Deleted

## 2012-12-27 DIAGNOSIS — M79609 Pain in unspecified limb: Secondary | ICD-10-CM

## 2012-12-27 DIAGNOSIS — M7989 Other specified soft tissue disorders: Secondary | ICD-10-CM

## 2012-12-27 DIAGNOSIS — S12100A Unspecified displaced fracture of second cervical vertebra, initial encounter for closed fracture: Secondary | ICD-10-CM

## 2012-12-27 DIAGNOSIS — S82899A Other fracture of unspecified lower leg, initial encounter for closed fracture: Secondary | ICD-10-CM

## 2012-12-27 DIAGNOSIS — S069X9A Unspecified intracranial injury with loss of consciousness of unspecified duration, initial encounter: Secondary | ICD-10-CM

## 2012-12-27 LAB — PROTIME-INR
INR: 1.06 (ref 0.00–1.49)
Prothrombin Time: 13.7 seconds (ref 11.6–15.2)

## 2012-12-27 MED ORDER — WARFARIN SODIUM 7.5 MG PO TABS
7.5000 mg | ORAL_TABLET | Freq: Once | ORAL | Status: AC
  Administered 2012-12-27: 7.5 mg via ORAL
  Filled 2012-12-27: qty 1
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
## 2012-12-27 NOTE — Progress Notes (Signed)
VASCULAR LAB PRELIMINARY  PRELIMINARY  PRELIMINARY  PRELIMINARY  Bilateral lower extremity venous Dopplers completed.    Preliminary report:  There is no obvious evidence of DVT or SVT noted in the bilateral lower extremities.  Unable to visualize the bilateral calves secondary to casts.  Moon Budde, RVT 12/27/2012, 4:58 PM

## 2012-12-27 NOTE — Progress Notes (Signed)
Physical Therapy Session Note  Patient Details  Name: Monique [name] MRN: [id number] Date of Birth: 1985/12/09  Today's Date: 12/27/2012 Time: 1600-1630 Time Calculation (min): 30 min  Skilled Therapeutic Interventions/Progress Updates:  Balance/vestibular training;Discharge planning;DME/adaptive equipment instruction;Functional mobility training;Pain management;Patient/family education;Splinting/orthotics;Therapeutic Activities;Therapeutic Exercise;UE/LE Strength taining/ROM;Wheelchair propulsion/positioning   Therapy Documentation Precautions:  Precautions: Fall;Cervical Precaution Comments: C2 and sternal fx's.  MD cleared for FWB B UE's Required Braces or Orthoses: Cervical Brace Cervical Brace: Hard collar Restrictions Weight Bearing Restrictions: Yes RLE Weight Bearing: Non weight bearing LLE Weight Bearing: Non weight bearing Pain: 0/10 neck pain at rest in supine  Therapeutic Exercise:(30')  B LE's in supine, AROM B UE's into flexion with breathing in/out while in overhead position (painfree).  Reviewed incentive spirometer purpose and technique (patient able to perform independently)  See FIM for current functional status  Therapy/Group: Individual Therapy  Rex Kras 12/27/2012, 6:08 PM
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
## 2012-12-28 ENCOUNTER — Inpatient Hospital Stay (HOSPITAL_COMMUNITY): Payer: No Typology Code available for payment source | Admitting: *Deleted

## 2012-12-28 LAB — PROTIME-INR
INR: 1.4 (ref 0.00–1.49)
Prothrombin Time: 16.8 seconds — ABNORMAL HIGH (ref 11.6–15.2)

## 2012-12-28 MED ORDER — WHITE PETROLATUM GEL
Status: AC
  Administered 2012-12-28: 10:00:00
  Filled 2012-12-28: qty 5

## 2012-12-28 MED ORDER — WARFARIN SODIUM 7.5 MG PO TABS
7.5000 mg | ORAL_TABLET | Freq: Once | ORAL | Status: AC
  Administered 2012-12-28: 7.5 mg via ORAL
  Filled 2012-12-28: qty 1

## 2012-12-28 NOTE — Progress Notes (Signed)
Physical Therapy Session Note  Patient Details  Name: Monique [name] MRN: [id number] Date of Birth: 07-09-1986  Today's Date: 12/28/2012 Time: 6644-0347 Time Calculation (min): 45 min   Skilled Therapeutic Interventions/Progress Updates:  Patient in bed ,supine, complained of pain in her chest reported to nursing, pain associated with sternal fx. In supine position SLR 2 x 12, Hip flexion 2 x 12, hip abd 2 x 12 Training in rolling to the R side with mi nA, supine to sit transfer with mod A. Sitting EOB 2 x for about 5 min to increase tolerance to erect position, patient reported being slightly dizzy during initial sitting. Transfer training with using sliding board bed=>w/c with mod A, w/c to bed =>max A Sitting in w/c and attempted w/c mobility training. Patient able to propel the w/c with her R hand only for less than 30 feet with mod A.   Therapy Documentation Precautions:  Precautions Precautions: Fall;Cervical Precaution Comments: C2 and sternal fx's.  MD cleared for FWB B UE's Required Braces or Orthoses: Cervical Brace Cervical Brace: Hard collar Restrictions Weight Bearing Restrictions: Yes RLE Weight Bearing: Non weight bearing LLE Weight Bearing: Non weight bearing General:   Vital Signs:   Pain: Pain Assessment Pain Assessment: No/denies pain Pain Score:   2 Mobility:   Locomotion :    Trunk/Postural Assessment :    Balance:   Exercises:   Other Treatments:    See FIM for current functional status  Therapy/Group: Individual Therapy  Dorna Mai 12/28/2012, 11:40 AM
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
## 2012-12-29 ENCOUNTER — Inpatient Hospital Stay (HOSPITAL_COMMUNITY): Payer: No Typology Code available for payment source | Admitting: Speech Pathology

## 2012-12-29 ENCOUNTER — Inpatient Hospital Stay (HOSPITAL_COMMUNITY): Payer: No Typology Code available for payment source

## 2012-12-29 ENCOUNTER — Inpatient Hospital Stay (HOSPITAL_COMMUNITY): Payer: No Typology Code available for payment source | Admitting: Physical Therapy

## 2012-12-29 LAB — COMPREHENSIVE METABOLIC PANEL
ALT: 21 U/L (ref 0–35)
AST: 23 U/L (ref 0–37)
Albumin: 3.2 g/dL — ABNORMAL LOW (ref 3.5–5.2)
CO2: 25 mEq/L (ref 19–32)
Chloride: 96 mEq/L (ref 96–112)
Creatinine, Ser: 0.64 mg/dL (ref 0.50–1.10)
Potassium: 4.1 mEq/L (ref 3.5–5.1)
Sodium: 136 mEq/L (ref 135–145)
Total Bilirubin: 0.3 mg/dL (ref 0.3–1.2)

## 2012-12-29 LAB — CBC WITH DIFFERENTIAL/PLATELET
Basophils Absolute: 0 10*3/uL (ref 0.0–0.1)
Basophils Relative: 0 % (ref 0–1)
Lymphocytes Relative: 37 % (ref 12–46)
MCHC: 31.9 g/dL (ref 30.0–36.0)
Monocytes Absolute: 0.6 10*3/uL (ref 0.1–1.0)
Neutro Abs: 3.5 10*3/uL (ref 1.7–7.7)
Neutrophils Relative %: 49 % (ref 43–77)
Platelets: 514 10*3/uL — ABNORMAL HIGH (ref 150–400)
RDW: 13.9 % (ref 11.5–15.5)
WBC: 7.1 10*3/uL (ref 4.0–10.5)

## 2012-12-29 LAB — PROTIME-INR
INR: 1.71 — ABNORMAL HIGH (ref 0.00–1.49)
Prothrombin Time: 19.5 seconds — ABNORMAL HIGH (ref 11.6–15.2)

## 2012-12-29 MED ORDER — WARFARIN SODIUM 7.5 MG PO TABS
7.5000 mg | ORAL_TABLET | Freq: Once | ORAL | Status: AC
  Administered 2012-12-29: 7.5 mg via ORAL
  Filled 2012-12-29: qty 1

## 2012-12-29 MED ORDER — TIZANIDINE HCL 4 MG PO TABS
4.0000 mg | ORAL_TABLET | Freq: Three times a day (TID) | ORAL | Status: DC
  Administered 2012-12-29 – 2013-01-03 (×13): 4 mg via ORAL
  Filled 2012-12-29 (×10): qty 1

## 2012-12-29 MED ORDER — TIZANIDINE HCL 4 MG PO TABS: 4.0000 mg | ORAL_TABLET | Freq: Three times a day (TID) | ORAL | Status: DC

## 2012-12-29 MED ORDER — TIZANIDINE HCL 4 MG PO TABS
4.0000 mg | ORAL_TABLET | Freq: Three times a day (TID) | ORAL | Status: DC
  Administered 2012-12-29: 4 mg via ORAL
  Filled 2012-12-29 (×3): qty 1

## 2012-12-29 NOTE — Progress Notes (Signed)
[facility] Individual Statement of Services  Patient Name:  Monique [name]  Date:  12/29/2012  Welcome to the [facility]patient [facility].  Our goal is to provide you with an individualized program based on your diagnosis and situation, designed to meet your specific needs.  With this comprehensive rehabilitation program, you will be expected to participate in at least 3 hours of rehabilitation therapies Monday-Friday, with modified therapy programming on the weekends.  Your rehabilitation program will include the following services:  Physical Therapy (PT), Occupational Therapy (OT), Speech Therapy (ST), 24 hour per day rehabilitation nursing, Therapeutic Recreaction (TR), Neuropsychology, Case Management (RN and Social Worker), Rehabilitation Medicine, Nutrition Services and Pharmacy Services  Weekly team conferences will be held on Tuesdays to discuss your progress.  Your  Social Worker will talk with you frequently to get your input and to update you on team discussions.  Team conferences with you and your family in attendance may also be held.  Expected length of stay: 2 weeks  Overall anticipated outcome: Modified independent (wheelchair)  Depending on your progress and recovery, your program may change.  Your  Social Worker will coordinate services and will keep you informed of any changes.  Your  Social Worker's name and contact numbers are listed  below.  The following services may also be recommended but are not provided by the [facility]:   Driving Evaluations  Home Health Rehabiltiation Services  Outpatient Rehabilitatation [facility]  Vocational Rehabilitation   Arrangements will be made to provide these services after discharge if needed.  Arrangements include referral to agencies that provide these services.  Your insurance has been verified to be:  Medicaid Your primary doctor is:  [facility]  Pertinent  information will be shared with your doctor and your insurance company.  Social Worker:  [place], [place] [phone] or ([phone]  Information discussed with and copy given to patient by: Amada Jupiter, 12/29/2012, 3:42 PM

## 2012-12-29 NOTE — Progress Notes (Addendum)
Speech Language Pathology Daily Session Note  Patient Details  Name: Vielka Klinedinst MRN: [id number] Date of Birth: 12/26/1985  Today's Date: 12/29/2012 Time: 4098-1191  Time Calculation (min): 60 min  Short Term Goals: Week 1: SLP Short Term Goal 1 (Week 1): Pt will demonstrate selective attention to simple task for 5 minutes with minimal verbal question cues.   SLP Short Term Goal 2 (Week 1): Pt will demonstate intellectual awareness to 2 cognition areas by verbalizing them with minimal verbal question cues.   SLP Short Term Goal 3 (Week 1): Pt will demonstrate performance of home management tasks including medicine management, financial management, scheduling, etc with moderate verbal cueing and 75% accuracy.    Skilled Therapeutic Interventions: Treatment focused on cognitive goals. SLP facilitated session by providing mod A verbal cues to create a desired schedule, as pt exhibited decreased mental flexibility. Pt independently demonstrated alternating attention by alternating between counting money and working with Charity fundraiser. Pt verbalized difficulty focusing due to internal distractors, and SLP provided education regarding internal vs external distractors.   FIM:  Comprehension Comprehension Mode: Auditory Comprehension: 6-Follows complex conversation/direction: With extra time/assistive device Expression Expression Mode: Verbal Expression: 5-Expresses basic needs/ideas: With no assist Social Interaction Social Interaction: 6-Interacts appropriately with others with medication or extra time (anti-anxiety, antidepressant). Problem Solving Problem Solving: 5-Solves basic 90% of the time/requires cueing < 10% of the time Memory Memory: 5-Recognizes or recalls 90% of the time/requires cueing < 10% of the time FIM - Eating Eating Activity: 5: Set-up assist for cut food  Pain Pain Assessment Pain Assessment: No/denies pain Pain Score:   9 Pain Type: Acute pain;Surgical pain Pain  Location: Leg Pain Orientation: Right;Left Pain Descriptors: Aching;Sore;Spasm Pain Frequency: Constant Pain Onset: On-going Patients Stated Pain Goal: 3 Pain Intervention(s): Medication (See eMAR) (oxycodone 10 mgpo)  Therapy/Group: Individual Therapy  Maxcine Ham 12/29/2012, 12:17 PM
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
## 2012-12-29 NOTE — Progress Notes (Signed)
Physical Therapy Session Note  Patient Details  Name: Monique [name] MRN: [id number] Date of Birth: Nov 09, 1985  Today's Date: 12/29/2012 Time: 1400-1445 Time Calculation (min): 45 min  Skilled Therapeutic Interventions/Progress Updates:    Pt in bed upon arrival. Pt expressing concern over therapy schedules, medications, and desire to have more rest throughout the day. RN present and therapy goals and objective discussed with pt. Discussed ways we can work together to obtain best possible results and functional gains while pt is on rehab.   Pt agreed to OOB mobility, bed mobility performed with supervision, sliding board transfer to wheelchair performed slowly and with min assist. Pt beginning to direct therapist for wheelchair set-up, pt able to place board with min assist, mod verbal cues. Wheelchair mobility in room initially with hand over hand assist for turns progressing to supervision. Pt able to propel wheelchair x 120' in hallway for strengthening and conditioning however developed a "migraine" from the noise and lights. Pt desired to return to bed, sliding board transfer wheelchair to bed with mod assist to clear wheel but otherwise supervision level. Mod verbal cues and min assist for board placement.   Pt demonstrates decreased mental flexibility, impaired attention, memory, and frustration tolerance through therapy however does well trying to work through pain.   Therapy Documentation Precautions:  Precautions Precautions: Fall;Cervical Precaution Comments: C2 and sternal fx's.  MD cleared for FWB B UE's Required Braces or Orthoses: Cervical Brace Cervical Brace: Hard collar Restrictions Weight Bearing Restrictions: Yes RLE Weight Bearing: Non weight bearing LLE Weight Bearing: Non weight bearing Pain: Pain Assessment Pain Assessment: 0-10 Pain Score:   8 Pain Type: Acute pain;Surgical pain Pain Location: Leg Pain Orientation: Right;Left Pain Descriptors:  Aching;Sore Pain Frequency: Constant Pain Onset: Gradual Patients Stated Pain Goal: 3 Pain Intervention(s): RN gave pt medications  See FIM for current functional status  Therapy/Group: Individual Therapy  Wilhemina Bonito 12/29/2012, 4:00 PM
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
## 2012-12-29 NOTE — Progress Notes (Signed)
Patient information reviewed and entered into eRehab system by Amayrany Cafaro, RN, CRRN, PPS Coordinator.  Information including medical coding and functional independence measure will be reviewed and updated through discharge.    

## 2012-12-29 NOTE — Progress Notes (Signed)
Occupational Therapy Session Note  Patient Details  Name: Monique [name] MRN: [id number] Date of Birth: Jan 12, 1986  Today's Date: 12/29/2012  Session 1 Time: 1000-1100 Time Calculation (min): 60 min  Short Term Goals: Week 1:  OT Short Term Goal 1 (Week 1): PT WILL GROOM SELF AS SUPERVISION LEVEL AT SINK IN WC OT Short Term Goal 2 (Week 1): PT WILL BATHE AT SUPERVISION LEVEL OT Short Term Goal 3 (Week 1): PT WIL DRESS UB AT MINIMAL ASSIST LEVEL OT Short Term Goal 4 (Week 1): PT. WILL DRESS LB AT MINIMAL ASSIST LEVEL OT Short Term Goal 5 (Week 1): PT WILL TRANSFER TO DROPPED ARM BSC WITH MINIMAL ASSIST.  Skilled Therapeutic Interventions/Progress Updates:    Pt in w/c upon arrival and requested to get in bed to use bed pan.  Pt and boyfriend insistent on performing A/P transfer to bed (+2 pt=50%). Remainder of session focused on bed mobility and dynamic sitting balance seated EOB.  Pt c/o slight dizziness when performing supine>sit EOB but quickly resolves.  Pt performed supine<>sit EOB with supervision and extra time to complete tasks.  Seated EOB pt performed lateral leans to simulate pulling pants up and down in preparation for dressing EOB and toileting on BSC.  Therapy Documentation Precautions:  Precautions Precautions: Fall;Cervical Precaution Comments: C2 and sternal fx's.  MD cleared for FWB B UE's Required Braces or Orthoses: Cervical Brace Cervical Brace: Hard collar Restrictions Weight Bearing Restrictions: Yes RLE Weight Bearing: Non weight bearing LLE Weight Bearing: Non weight bearing General:   Vital Signs:   Pain: Pain Assessment Pain Assessment: 0-10 Pain Score:   9 Pain Type: Acute pain;Surgical pain Pain Location: Leg Pain Orientation: Right;Left Pain Descriptors: Aching;Sore;Spasm Pain Frequency: Constant Pain Onset: On-going Patients Stated Pain Goal: 3 Pain Intervention(s): RN Aware Multiple Pain Sites: Yes 2nd Pain Site Pain Score: 8 Pain  Location: Shoulder Pain Intervention(s): Repositioned;Emotional support  See FIM for current functional status  Therapy/Group: Individual Therapy  Session 2 Time: Time: 1300-1340 Pt c/o increased pain in sternal area and left side of neck; unrated; RN aware Individual Therapy  Pt in bed with covers over eyes.  Pt's boyfriend at bedside.Pt required min verbal cues for arousal and c/o of "not being able to rest." Pt required min encouragement to sit EOB and perform sliding board transfers to w/c.  Pt requested to roll to right side to sit EOB and performed at supervision level.  Pt requested that she transfer to her right because it was easier.  Explained that she needed to practice transferring to left because she wouldn't always be able to transfer to right.  Pt performed transfer with mod A and verbal cues for body positioning.  Throughout session pt made grimacing looks on her face and stated her neck hurt and she needed to get back in bed because she couldn't get any rest.  Explained to patient that participation in therapy was prerequisite to admission to rehab and remaining in rehab.  Explained to patient that participation would help her become more independent at home.  Pt stated "you don't understand that I don't have a job and won't need to do anything.  You guy just need to let me rest and help me get better." Pt returned to room and instructed to remain in w/c until next therapy in 20 mins.  Pt left in w/c with boyfriend at side.  Lavone Neri [facility] 12/29/2012, 3:11 PM
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
## 2012-12-29 NOTE — Progress Notes (Signed)
Physical Therapy Session Note  Patient Details  Name: Monique [name] MRN: [id number] Date of Birth: [birth year]  Today's Date: 12/29/2012 Time: 0730-0830 Time Calculation (min): 60 min  Skilled Therapeutic Interventions/Progress Updates:    Pt slow to get up secondary to pain, slow to process likely secondary to meds and brain trauma. Worked on bed mobility supine <> sit X 2 reps, both with min assist however less assist needed with second attempt after cues for efficiency. Sliding board transfer bed to drop arm recliner with min/mod assist cues for sequencing, placement of board, and weight bearing precautions. Bil. LE modified exercise program initiated, handout provided. Pt performing each of the exercises x 10 reps: "toe pumps", heel slides, short arc quads, hip abduction/adduction, quad/glute squeezes.   Positioned pt comfortably in recliner, encouraged to stay up through morning therapies. All needs in reach, boyfriend present.  Therapy Documentation Precautions:  Precautions Precautions: Fall;Cervical Precaution Comments: C2 and sternal fx's.  MD cleared for FWB B UE's Required Braces or Orthoses: Cervical Brace Cervical Brace: Hard collar Restrictions Weight Bearing Restrictions: Yes RLE Weight Bearing: Non weight bearing LLE Weight Bearing: Non weight bearing Pain: Pain Assessment 7/10, Lt. Side of upper torso, RN in room to provide medication  See FIM for current functional status  Therapy/Group: Individual Therapy  Wilhemina Bonito 12/29/2012, 12:12 PM

## 2012-12-30 ENCOUNTER — Ambulatory Visit (HOSPITAL_COMMUNITY): Payer: Medicaid Other | Admitting: *Deleted

## 2012-12-30 ENCOUNTER — Inpatient Hospital Stay (HOSPITAL_COMMUNITY): Payer: No Typology Code available for payment source | Admitting: Speech Pathology

## 2012-12-30 ENCOUNTER — Inpatient Hospital Stay (HOSPITAL_COMMUNITY): Payer: Medicaid Other

## 2012-12-30 ENCOUNTER — Inpatient Hospital Stay (HOSPITAL_COMMUNITY): Payer: No Typology Code available for payment source | Admitting: Physical Therapy

## 2012-12-30 ENCOUNTER — Inpatient Hospital Stay (HOSPITAL_COMMUNITY): Payer: Medicaid Other | Admitting: Speech Pathology

## 2012-12-30 ENCOUNTER — Inpatient Hospital Stay (HOSPITAL_COMMUNITY): Payer: No Typology Code available for payment source

## 2012-12-30 LAB — URINE CULTURE: Colony Count: 14

## 2012-12-30 LAB — PROTIME-INR
INR: 1.84 — ABNORMAL HIGH (ref 0.00–1.49)
Prothrombin Time: 20.6 s — ABNORMAL HIGH (ref 11.6–15.2)

## 2012-12-30 MED ORDER — WARFARIN SODIUM 7.5 MG PO TABS
7.5000 mg | ORAL_TABLET | Freq: Once | ORAL | Status: AC
  Administered 2012-12-30: 7.5 mg via ORAL
  Filled 2012-12-30: qty 1
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
## 2012-12-30 NOTE — Progress Notes (Signed)
Speech Language Pathology Daily Session Note  Patient Details  Name: Monique [name] MRN: [id number] Date of Birth: 1986-05-20  Today's Date: 12/30/2012 Time: 1030-1100 Time Calculation (min): 30 min  Short Term Goals: Week 1: SLP Short Term Goal 1 (Week 1): Pt will demonstrate selective attention to simple task for 5 minutes with minimal verbal question cues.   SLP Short Term Goal 2 (Week 1): Pt will demonstate intellectual awareness to 2 cognition areas by verbalizing them with minimal verbal question cues.   SLP Short Term Goal 3 (Week 1): Pt will demonstrate performance of home management tasks including medicine management, financial management, scheduling, etc with moderate verbal cueing and 75% accuracy.    Skilled Therapeutic Interventions: Treatment focus on cognitive goals. SLP facilitated session by providing supervision question cues to verbally problem solve strategies she can utilize to decrease her frustration and overall anxiety. Pt reported she enjoys music to help her relax and pt was given a pair of headphones to use to decrease environmental distractions and noise Conservation officer, historic buildings). Pt also participated in a mildly complex new learning task.  Pt recalled rules to task with Mod I and required extra time for problem solving and organization throughout the task. Pt also independently recalled her schedule without use of an external visual aid.   FIM:  Comprehension Comprehension: 6-Follows complex conversation/direction: With extra time/assistive device Expression Expression Mode: Verbal Expression: 6-Expresses complex ideas: With extra time/assistive device Social Interaction Social Interaction: 5-Interacts appropriately 90% of the time - Needs monitoring or encouragement for participation or interaction. Problem Solving Problem Solving: 5-Solves complex 90% of the time/cues < 10% of the time Memory Memory: 6-More than reasonable amt of time  Pain Pain Assessment Pain  Assessment: No/denies pain Pain Score:   3 Pain Type: Acute pain Pain Location: Leg Pain Orientation: Right;Left Pain Descriptors: Sore Pain Frequency: Occasional Pain Onset: Gradual Patients Stated Pain Goal: 2 Pain Intervention(s): Medication (See eMAR) (oxycodone 10 mg po)  Therapy/Group: Individual Therapy  Stefannie Defeo 12/30/2012, 11:23 AM

## 2012-12-30 NOTE — Progress Notes (Signed)
Occupational Therapy Session Note  Patient Details  Name: Monique [name] MRN: [id number] Date of Birth: 08-13-1986  Today's Date: 12/30/2012  Session 1 Time: 0915-1000 Time Calculation (min): 45 min  Short Term Goals: Week 1:  OT Short Term Goal 1 (Week 1): PT WILL GROOM SELF AS SUPERVISION LEVEL AT SINK IN WC OT Short Term Goal 2 (Week 1): PT WILL BATHE AT SUPERVISION LEVEL OT Short Term Goal 3 (Week 1): PT WIL DRESS UB AT MINIMAL ASSIST LEVEL OT Short Term Goal 4 (Week 1): PT. WILL DRESS LB AT MINIMAL ASSIST LEVEL OT Short Term Goal 5 (Week 1): PT WILL TRANSFER TO DROPPED ARM BSC WITH MINIMAL ASSIST.  Skilled Therapeutic Interventions/Progress Updates:    Pt performed LB bathing and dressing at bed level before transferring to w/c to complete bathing and grooming tasks seated in w/c at sink.  Pt completed all tasks with supervision and required assist only to place sliding board and steady w/c during transfer.  Pt's boyfriend present during therapy session. Practiced drop arm BSC transfers and clothing management with steady A. Focus on activity tolerance, transfers, and safety awareness.  Therapy Documentation Precautions:  Precautions Precautions: Fall;Cervical Precaution Comments: C2 and sternal fx's.  MD cleared for FWB B UE's Required Braces or Orthoses: Cervical Brace Cervical Brace: Hard collar Restrictions Weight Bearing Restrictions: Yes RLE Weight Bearing: Non weight bearing LLE Weight Bearing: Non weight bearing General: General Amount of Missed OT Time (min):  (15)    Pain: Pain Assessment Pain Assessment: No/denies pain Pain Score:   3 Pain Type: Acute pain Pain Location: Leg Pain Orientation: Right;Left Pain Descriptors: Sore Pain Frequency: Occasional Pain Onset: Gradual Patients Stated Pain Goal: 2 Pain Intervention(s): Medication (See eMAR) (oxycodone 10 mg po) ADL: ADL Eating: Set up Where Assessed-Eating: Bed level Grooming: Maximal  assistance Where Assessed-Grooming: Bed level Upper Body Bathing: Dependent Where Assessed-Upper Body Bathing: Bed level Lower Body Bathing: Maximal assistance Where Assessed-Lower Body Bathing: Bed level Upper Body Dressing: Maximal assistance Where Assessed-Upper Body Dressing: Bed level Lower Body Dressing: Dependent Where Assessed-Lower Body Dressing: Bed level Toileting: Dependent Where Assessed-Toileting: Bed level Toilet Transfer: Unable to assess Toilet Transfer Method: Unable to assess Tub/Shower Transfer: Unable to assess Tub/Shower Transfer Method: Unable to assess Film/video editor: Unable to assess Film/video editor Method: Unable to assess  See FIM for current functional status  Therapy/Group: Individual Therapy  Session 2 Time: 1300-1345 Pt denies pain Individual Therapy Pt's boyfriend present for therapy session.  Pt eating lunch in bed upon arrival but agreeable to participating in therapy session.  Pt requested to use toilet and transferred to drop arm BSC  With sliding board.  Pt completed toileting tasks at supervision level.  Instructed boyfriend in w/c set up and boyfriend return demonstrated.  Boyfriend assisted patient with w/c setup and sliding board placement for transfers.  Pt propelled to ADL apartment to practice furniture transfers and w/c mobility in kitchen.  Lavone Neri [facility] 12/30/2012, 10:08 AM

## 2012-12-30 NOTE — Progress Notes (Signed)
Physical Therapy Session Note  Patient Details  Name: Monique [name] MRN: [id number] Date of Birth: 05-26-1986  Today's Date: 12/30/2012 Time: 4098-1191 Time Calculation (min): 30 min  Short Term Goals: Week 1:   = long term goals  Skilled Therapeutic Interventions/Progress Updates:    Pt has not spoken with her father yet re: measurements for door frames or ramp.   Session focused on unlevel transfers and core strengthening. Sliding board transfers to Rt/Lt. Wheelchair to/from mat (very elevated) and bed (slightly elevated). Pt required min assist with transfer to very high mat. She requires min assist for wheelchair leg rests and breaks secondary to musculoskeletal impairments. Min verbal cues for wheelchair placement and board placement. Sitting balance and core strengthening activities while sitting on dyna-disc including ball toss at limits of stability and basketball shooting. Pt given mental challenges during activity such as mathematical problems and counting backwards by multiples.   Therapy Documentation Precautions:  Precautions Precautions: Fall;Cervical Precaution Comments: C2 and sternal fx's.  MD cleared for FWB B UE's Required Braces or Orthoses: Cervical Brace Cervical Brace: Hard collar Restrictions Weight Bearing Restrictions: Yes RLE Weight Bearing: Non weight bearing LLE Weight Bearing: Non weight bearing Pain: Pain Assessment Pain Assessment: No/denies pain   See FIM for current functional status  Therapy/Group: CotreatmentTherapy with recreational therapist  Wilhemina Bonito 12/30/2012, 3:41 PM
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
## 2012-12-30 NOTE — Progress Notes (Signed)
Physical Therapy Session Note  Patient Details  Name: Monique [name] MRN: [id number] Date of Birth: 04/15/1986  Today's Date: 12/30/2012 Time: 4098-1191 Time Calculation (min): 38 min   Skilled Therapeutic Interventions/Progress Updates:    Discussed discharge with pt, pt's boyfriend, and pt's mother. Pt is going to her father's house which has 2 small steps to enter. Discussed two person wheelchair bump up/down vs. Ramp. Pt going to discuss with father.   Practiced car transfer with sliding board, pt requires verbal cues for wheelchair set up and board placement however physically able to perform transfer with supervision. Pt and her boyfriend practiced wheelchair set up board placement,  And sliding board transfer wheelchair to/from elevated actual bed with supervision. Cues needed for leg rest management and appropriate placement of board. Pt transferred to hospital bed upon arrival to room, boyfriend providing supervision. Both needed min cues for wheelchair management and sequencing. Cues provided for positioning of boyfriend for providing supervision.  Pt to have a family member measure her father's doorways to make sure a wheelchair with the width she desires will fit.   Therapy Documentation Precautions:  Precautions Precautions: Fall;Cervical Precaution Comments: C2 and sternal fx's.  MD cleared for FWB B UE's Required Braces or Orthoses: Cervical Brace Cervical Brace: Hard collar Restrictions Weight Bearing Restrictions: Yes RLE Weight Bearing: Non weight bearing LLE Weight Bearing: Non weight bearing Pain: Pain Assessment Pain Assessment: No/denies pain   See FIM for current functional status  Therapy/Group: Individual Therapy  Wilhemina Bonito 12/30/2012, 11:55 AM

## 2012-12-30 NOTE — Evaluation (Signed)
Recreational Therapy Assessment and Plan  Patient Details  Name: Monique [name] MRN: [id number] Date of Birth: 1986/03/22 Today's Date: 12/30/2012  Assessment Clinical Impression: Met with pt briefly & discussed leisure interests.  Pt scheduled for discharge home Friday, 3/28. No formal TR treatment plan implemented at this time.  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

## 2012-12-30 NOTE — Progress Notes (Signed)
Social Work Patient ID: Monique [name], female   DOB: 10-04-1986, 27 y.o.   MRN: [id number]  Met with pt to review team conference.  Aware and agreeable with targeted d/c 3/28 with mod i w/c level goals.  Therapies still discussing home mod needs.  May need to rent temp ramp.  Pt aware that team recommends holding on any PT or OT at discharge with plan to start when WB restrictions are lifted - agreeable.  We will, however, need to have HHRN for coumadin protocol.  Working on confirming DME needs and will then order.  Continue to follow.  Adolf Ormiston, LCSW

## 2012-12-31 ENCOUNTER — Inpatient Hospital Stay (HOSPITAL_COMMUNITY): Payer: No Typology Code available for payment source | Admitting: Physical Therapy

## 2012-12-31 ENCOUNTER — Inpatient Hospital Stay (HOSPITAL_COMMUNITY): Payer: No Typology Code available for payment source

## 2012-12-31 ENCOUNTER — Inpatient Hospital Stay (HOSPITAL_COMMUNITY): Payer: No Typology Code available for payment source | Admitting: *Deleted

## 2012-12-31 ENCOUNTER — Inpatient Hospital Stay (HOSPITAL_COMMUNITY): Payer: No Typology Code available for payment source | Admitting: Speech Pathology

## 2012-12-31 LAB — PROTIME-INR
INR: 2.19 — ABNORMAL HIGH (ref 0.00–1.49)
Prothrombin Time: 23.4 seconds — ABNORMAL HIGH (ref 11.6–15.2)

## 2012-12-31 LAB — TSH: TSH: 1.384 u[IU]/mL (ref 0.350–4.500)

## 2012-12-31 MED ORDER — WARFARIN SODIUM 7.5 MG PO TABS
7.5000 mg | ORAL_TABLET | Freq: Once | ORAL | Status: AC
  Administered 2012-12-31: 7.5 mg via ORAL
  Filled 2012-12-31: qty 1

## 2012-12-31 MED ORDER — OXYCODONE HCL 5 MG PO TABS
5.0000 mg | ORAL_TABLET | ORAL | Status: DC | PRN
  Administered 2012-12-31 – 2013-01-03 (×8): 10 mg via ORAL
  Filled 2012-12-31: qty 2
  Filled 2012-12-31: qty 3
  Filled 2012-12-31 (×6): qty 2

## 2012-12-31 NOTE — Progress Notes (Signed)
Occupational Therapy Session Note  Patient Details  Name: Monique [name] MRN: [id number] Date of Birth: 05-Sep-1986  Today's Date: 12/31/2012 Time: 0900-1000 Time Calculation (min): 60 min  Short Term Goals: Week 1:  OT Short Term Goal 1 (Week 1): PT WILL GROOM SELF AS SUPERVISION LEVEL AT SINK IN WC OT Short Term Goal 2 (Week 1): PT WILL BATHE AT SUPERVISION LEVEL OT Short Term Goal 3 (Week 1): PT WIL DRESS UB AT MINIMAL ASSIST LEVEL OT Short Term Goal 4 (Week 1): PT. WILL DRESS LB AT MINIMAL ASSIST LEVEL OT Short Term Goal 5 (Week 1): PT WILL TRANSFER TO DROPPED ARM BSC WITH MINIMAL ASSIST.  Skilled Therapeutic Interventions/Progress Updates:    Pt engaged in bathing and dressing tasks with boyfriend providing setup assistance as needed.  Pt independent with directing care and asks for assistance appropriately.  Pt completed LB bathing and dressing at bed level before transferring to w/c to complete UB bathing and dressing and grooming tasks.  Focus on continued education with boyfriend, activity tolerance, transfers, and safety awareness.  Therapy Documentation Precautions:  Precautions Precautions: Fall;Cervical Precaution Comments: C2 and sternal fx's.  MD cleared for FWB B UE's Required Braces or Orthoses: Cervical Brace Cervical Brace: Hard collar Restrictions Weight Bearing Restrictions: Yes RLE Weight Bearing: Non weight bearing LLE Weight Bearing: Non weight bearing General:   Vital Signs:   Pain: Pain Assessment Pain Assessment: 0-10 Pain Score:   7 Pain Type: Acute pain Pain Location: Leg Pain Orientation: Right;Left Pain Descriptors: Sore Pain Frequency: Occasional Pain Onset: Gradual Patients Stated Pain Goal: 2 Pain Intervention(s): RN provided medications during therapy  See FIM for current functional status  Therapy/Group: Individual Therapy  Rich Brave 12/31/2012, 11:46 AM

## 2012-12-31 NOTE — Progress Notes (Signed)
Physical Therapy Note  Patient Details  Name: Monique [name] MRN: [id number] Date of Birth: Apr 08, 1986 Today's Date: 12/31/2012  1530-1555 (25 minutes) individual Pain : no complaint of pain Focus of exercise: Therapeutic exercise focused on activity tolerance Treatment: Pt transferred to bedside commode / SBA sliding board transfer (pt independently places board) . Boy friend transferred from beside commode to wc with sliding board SBA; UE ergonometer Level two  X 10 minutes ; wc to bed with sliding board SBA.   Field Staniszewski,JIM 12/31/2012, 3:43 PM
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
## 2012-12-31 NOTE — Progress Notes (Signed)
Speech Language Pathology Daily Session Note  Patient Details  Name: Monique [name] MRN: [id number] Date of Birth: 1986/03/27  Today's Date: 12/31/2012 Time: 1030-1100 Time Calculation (min): 30 min  Short Term Goals: Week 1: SLP Short Term Goal 1 (Week 1): Pt will demonstrate selective attention to simple task for 5 minutes with minimal verbal question cues.   SLP Short Term Goal 2 (Week 1): Pt will demonstate intellectual awareness to 2 cognition areas by verbalizing them with minimal verbal question cues.   SLP Short Term Goal 3 (Week 1): Pt will demonstrate performance of home management tasks including medicine management, financial management, scheduling, etc with moderate verbal cueing and 75% accuracy.    Skilled Therapeutic Interventions: Treatment focus on cognitive goals. Pt reported concern about her decreased frustration tolerance and irritability, especially when around particular family members and situation. Pt required supervision question cues to verbally problem solve strategies to utilize at discharge. Pt verbalized understanding and requested for clinician to speak with her family members in regards to strategies.    FIM:  Comprehension Comprehension Mode: Auditory Comprehension: 6-Follows complex conversation/direction: With extra time/assistive device Expression Expression Mode: Verbal Expression: 6-Expresses complex ideas: With extra time/assistive device Social Interaction Social Interaction: 6-Interacts appropriately with others with medication or extra time (anti-anxiety, antidepressant). Problem Solving Problem Solving: 6-Solves complex problems: With extra time Memory Memory: 5-Requires cues to use assistive device FIM - Eating Eating Activity: 5: Set-up assist for open containers  Pain Pain Assessment Pain Assessment: No/denies pain Pain Score: 0-No pain  Therapy/Group: Individual Therapy  Chanc Kervin 12/31/2012, 4:02 PM
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
## 2013-01-01 ENCOUNTER — Inpatient Hospital Stay (HOSPITAL_COMMUNITY): Payer: No Typology Code available for payment source | Admitting: Occupational Therapy

## 2013-01-01 ENCOUNTER — Inpatient Hospital Stay (HOSPITAL_COMMUNITY): Payer: No Typology Code available for payment source

## 2013-01-01 ENCOUNTER — Inpatient Hospital Stay (HOSPITAL_COMMUNITY): Payer: No Typology Code available for payment source | Admitting: Speech Pathology

## 2013-01-01 ENCOUNTER — Inpatient Hospital Stay (HOSPITAL_COMMUNITY): Payer: No Typology Code available for payment source | Admitting: Physical Therapy

## 2013-01-01 LAB — PROTIME-INR
INR: 2.37 — ABNORMAL HIGH (ref 0.00–1.49)
Prothrombin Time: 24.8 seconds — ABNORMAL HIGH (ref 11.6–15.2)

## 2013-01-01 MED ORDER — WARFARIN SODIUM 7.5 MG PO TABS
7.5000 mg | ORAL_TABLET | Freq: Once | ORAL | Status: AC
  Administered 2013-01-01: 7.5 mg via ORAL
  Filled 2013-01-01: qty 1
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
## 2013-01-01 NOTE — Progress Notes (Signed)
Physical Therapy Session Note  Patient Details  Name: Monique [name] MRN: [id number] Date of Birth: Apr 10, 1986  Today's Date: 01/01/2013 Time: 1400-1500 Time Calculation (min): 60 min  Short Term Goals: Week 1:  PT Short Term Goal 1 (Week 1): STGs=LTGs  Skilled Therapeutic Interventions/Progress Updates:    Session focused on education of boyfriend for safe D/C home. Practiced wheelchair bump up/down with boyfriend x 2 without pt in wheelchair (boyfriend practicing both roles in transfer). Practiced 1 x up/down steps with pt in wheelchair, PT and boyfriend providing assist, boyfriend only needed min cues for body mechanics. Gave boyfriend 3 handouts: ramp building specifications, step by step sequence for sliding board transfers, and bumping up/down steps with wheelchair and two person assist.    Practiced pt propelling up/down ramp with mod assist (secondary to sternal fracture). Pt's boyfriend practiced ramp and curb step bump up/down with min cues for sequencing and safety.   Pt propelled wheelchair x >200' x 2 during session modified independent for strengthening and conditioning. All sliding board transfers at set up/supervision level, pt and boyfriend able to perform appropriately. They both feel further practice on car transfer will be beneficial.   Therapy Documentation Precautions:  Precautions Precautions: Fall;Cervical Precaution Comments: C2 and sternal fx's.  MD cleared for FWB B UE's Required Braces or Orthoses: Cervical Brace Cervical Brace: Hard collar Restrictions Weight Bearing Restrictions: Yes RLE Weight Bearing: Non weight bearing LLE Weight Bearing: Non weight bearing Pain: Pain Assessment Pain Assessment: No/denies pain Pain Score:   4 Pain Type: Acute pain Pain Location: Sternum Pain Orientation: Anterior Pain Descriptors: Aching;Sore Pain Onset: On-going Pain Intervention(s):  (RN brough pain medication)  See FIM for current functional  status  Therapy/Group: Individual Therapy  Wilhemina Bonito 01/01/2013, 3:25 PM

## 2013-01-01 NOTE — Progress Notes (Signed)
Speech Language Pathology Session Note & Discharge Summary  Patient Details  Name: Pearle Wandler MRN: [id number] Date of Birth: [birth year]  Today's Date: 01/01/2013 Time: 1000-1055 Time Calculation (min): 55 min  Skilled Therapeutic Intervention: Treatment focus on continued pt/family education in regards to pt's cognitive function and strategies to utilize at discharge to increase working memory, attention and problem solving and to decrease pt's overall frustration and anxiety. Handouts given and both the pt and her family verbalized understanding of all information.   Patient has met 3 of 3 long term goals.  Patient to discharge at overall Modified Independent;Supervision level.   Reasons goals not met: N/A   Clinical Impression/Discharge Summary: Pt has made functional gains and has met 3 of 3 LTG's this admission due to improved attention, working memory with utilization of strategies, awareness and problem solving. Pt to discharge at an overall supervision-Mod I level. Pt/family education complete and pt will discharge to father's home with 24 hour supervision. Pt's care partner is independent to provide the necessary supervision level of assistance. F/u skilled SLP intervention is not recommended at this time.   Care Partner:  Caregiver Able to Provide Assistance: Yes  Type of Caregiver Assistance: Cognitive  Recommendation:  None      Equipment: N/A   Reasons for discharge: Treatment goals met   Patient/Family Agrees with Progress Made and Goals Achieved: Yes   See FIM for current functional status  Corlis Angelica 01/01/2013, 11:54 AM

## 2013-01-01 NOTE — Plan of Care (Signed)
Problem: Food- and Nutrition-Related Knowledge Deficit (NB-1.1) Goal: Nutrition education Formal process to instruct or train a patient/client in a skill or to impart knowledge to help patients/clients voluntarily manage or modify food choices and eating behavior to maintain or improve health. Outcome: Completed/Met Date Met:  01/01/13 RD was asked to see patient for education by SLP. SLP notes pt needs clarification on Coumadin + Vitamin K Nutrition Therapy and understanding GERD. This RD reviewed Coumadin + Vitamin K diet with patient as well as GERD Nutrition Therapy. Discussed need for consistent Vitamin K intake. Reviewed ways to decrease symptoms of GERD.  Body mass index is Body mass index is 30.93 kg/(m^2). Pt meets criteria for Obese Class I based on current BMI.  Current diet order is Regular, patient is consuming approximately 80-100% of meals at this time. Labs and medications reviewed. No further nutrition interventions warranted at this time. RD contact information provided. If additional nutrition issues arise, please re-consult RD.  Jarold Motto MS, RD, LDN Pager: [phone] After-hours pager: [phone]

## 2013-01-02 ENCOUNTER — Inpatient Hospital Stay (HOSPITAL_COMMUNITY): Payer: Medicaid Other | Admitting: Physical Therapy

## 2013-01-02 ENCOUNTER — Inpatient Hospital Stay (HOSPITAL_COMMUNITY): Payer: Medicaid Other | Admitting: Occupational Therapy

## 2013-01-02 ENCOUNTER — Inpatient Hospital Stay (HOSPITAL_COMMUNITY): Payer: Medicaid Other | Admitting: Speech Pathology

## 2013-01-02 ENCOUNTER — Inpatient Hospital Stay (HOSPITAL_COMMUNITY): Payer: No Typology Code available for payment source

## 2013-01-02 DIAGNOSIS — S12100A Unspecified displaced fracture of second cervical vertebra, initial encounter for closed fracture: Secondary | ICD-10-CM

## 2013-01-02 DIAGNOSIS — S82899A Other fracture of unspecified lower leg, initial encounter for closed fracture: Secondary | ICD-10-CM

## 2013-01-02 DIAGNOSIS — S069X9A Unspecified intracranial injury with loss of consciousness of unspecified duration, initial encounter: Secondary | ICD-10-CM

## 2013-01-02 LAB — PROTIME-INR
INR: 2.19 — ABNORMAL HIGH (ref 0.00–1.49)
Prothrombin Time: 23.4 seconds — ABNORMAL HIGH (ref 11.6–15.2)

## 2013-01-02 MED ORDER — POLYETHYLENE GLYCOL 3350 17 G PO PACK: 17.0000 g | PACK | Freq: Two times a day (BID) | ORAL | Status: DC

## 2013-01-02 MED ORDER — TIZANIDINE HCL 4 MG PO TABS: 4.0000 mg | ORAL_TABLET | Freq: Three times a day (TID) | ORAL | Status: DC

## 2013-01-02 MED ORDER — TRAMADOL HCL 50 MG PO TABS: 50.0000 mg | ORAL_TABLET | Freq: Four times a day (QID) | ORAL | Status: DC

## 2013-01-02 MED ORDER — WARFARIN SODIUM 7.5 MG PO TABS
7.5000 mg | ORAL_TABLET | Freq: Once | ORAL | Status: AC
  Administered 2013-01-02: 7.5 mg via ORAL
  Filled 2013-01-02: qty 1

## 2013-01-02 MED ORDER — SUMATRIPTAN SUCCINATE 6 MG/0.5ML ~~LOC~~ SOLN: SUBCUTANEOUS | Status: DC

## 2013-01-02 MED ORDER — FERROUS SULFATE 325 (65 FE) MG PO TABS: 325.0000 mg | ORAL_TABLET | Freq: Three times a day (TID) | ORAL | Status: DC

## 2013-01-02 MED ORDER — OXYCODONE HCL 10 MG PO TABS: 5.0000 mg | ORAL_TABLET | Freq: Four times a day (QID) | ORAL | Status: DC | PRN

## 2013-01-02 MED ORDER — WARFARIN SODIUM 5 MG PO TABS: 7.5000 mg | ORAL_TABLET | Freq: Every day | ORAL | Status: DC

## 2013-01-02 MED ORDER — TIZANIDINE HCL 4 MG PO TABS
4.0000 mg | ORAL_TABLET | Freq: Once | ORAL | Status: AC
  Administered 2013-01-02: 4 mg via ORAL
  Filled 2013-01-02: qty 1

## 2013-01-02 NOTE — Progress Notes (Signed)
ANTICOAGULATION CONSULT NOTE - Follow Up Consult  Pharmacy Consult for coumadin Indication: VTE prophylaxis  No Known Allergies  Patient Measurements: Height: 5\' 5"  (165.1 cm) Weight: 185 lb 13.6 oz (84.3 kg) IBW/kg (Calculated) : 57 Heparin Dosing Weight:   Vital Signs: Temp: 98.1 F (36.7 C) (03/28 0620) Temp src: Oral (03/28 0620) BP: 99/59 mmHg (03/28 0620) Pulse Rate: 80 (03/28 0620)  Labs:  Recent Labs  12/31/12 0605 01/01/13 0615 01/02/13 0630  LABPROT 23.4* 24.8* 23.4*  INR 2.19* 2.37* 2.19*    Estimated Creatinine Clearance: 112.2 ml/min (by C-G formula based on Cr of 0.64).   Medications:  Scheduled:  . bisacodyl  10 mg Oral Daily  . chlorhexidine  15 mL Mouth/Throat BID  . ferrous sulfate  325 mg Oral TID PC  . polyethylene glycol  17 g Oral BID  . tiZANidine  4 mg Oral TID  . traMADol  100 mg Oral Q6H  . [COMPLETED] warfarin  7.5 mg Oral ONCE-1800  . warfarin   Does not apply Once  . Warfarin - Pharmacist Dosing Inpatient   Does not apply q1800   Infusions:    Assessment: 27 years s/p ORIF is currently on therapeutic coumadin.  INR is 2.19 Goal of Therapy:  INR 2-3    Plan:  1) Coumadin 7.5mg  po x1 2) INR in am  Liann Spaeth, Tsz-Yin 01/02/2013,8:17 AM

## 2013-01-02 NOTE — Progress Notes (Signed)
Occupational Therapy Discharge Summary  Patient Details  Name: Monique [name] MRN: [id number] Date of Birth: June 23, 1986  Today's Date: 01/02/2013  Patient has met 9 of 9 long term goals due to improved activity tolerance, improved balance, postural control, ability to compensate for deficits, improved attention and improved awareness.  Pt made steady progress with bathing, dressing, drop arm BSC transfers, and toileting during this admission.  Pt is independent with directing care and asks for assistance appropriately.  Pt's boyfriend has been present for and participated in multiple therapy sessions.  Patient to discharge at overall supervision to min A level.  Patient's care partner is independent to provide the necessary physical and cognitive assistance at discharge.     Recommendations: No f/u OT at this time Equipment: Drop Arm BSC  Reasons for discharge: treatment goals met and discharge from hospital  Patient/family agrees with progress made and goals achieved: Yes  OT Discharge ADL ADL Eating: Independent Where Assessed-Eating: Bed level Grooming: Independent Where Assessed-Grooming: Sitting at sink Upper Body Bathing: Setup Where Assessed-Upper Body Bathing: Wheelchair;Sitting at sink Lower Body Bathing: Setup Where Assessed-Lower Body Bathing: Bed level Upper Body Dressing: Independent Where Assessed-Upper Body Dressing: Sitting at sink Lower Body Dressing: Setup Where Assessed-Lower Body Dressing: Bed level Toileting: Supervision/safety Where Assessed-Toileting: Bedside Commode Toilet Transfer: Close supervision Toilet Transfer Method: Scientist, research (life sciences): Drop arm bedside commode Tub/Shower Transfer: Unable to assess Tub/Shower Transfer Method: Unable to assess Film/video editor: Unable to assess Film/video editor Method: Unable to assess Vision/Perception  Vision - History Baseline Vision: No visual deficits Patient Visual  Report: No change from baseline Vision - Assessment Eye Alignment: Within Functional Limits Vision Assessment: Vision not tested Perception Perception: Within Functional Limits Praxis Praxis: Intact  Cognition Overall Cognitive Status: Impaired Arousal/Alertness: Awake/alert Orientation Level: Oriented X4 Attention: Selective Sustained Attention: Appears intact Selective Attention: Appears intact Selective Attention Impairment: Verbal basic;Functional basic Alternating Attention: Appears intact Memory: Impaired Memory Impairment: Decreased recall of new information;Decreased short term memory Decreased Short Term Memory: Verbal complex;Functional complex Awareness: Impaired Awareness Impairment: Anticipatory impairment Problem Solving: Appears intact Problem Solving Impairment: Verbal basic;Functional basic Rancho Mirant Scales of Cognitive Functioning: Purposeful/appropriate Sensation Sensation Light Touch: Appears Intact Coordination Gross Motor Movements are Fluid and Coordinated: Yes Fine Motor Movements are Fluid and Coordinated: Yes Motor  Motor Motor: Within Functional Limits Mobility     Trunk/Postural Assessment  Cervical Assessment Cervical Assessment: Exceptions to [facility] (cervical collar) Thoracic Assessment Thoracic Assessment: Within Functional Limits Lumbar Assessment Lumbar Assessment: Within Functional Limits  Balance Static Sitting Balance Static Sitting - Balance Support: Feet unsupported Static Sitting - Level of Assistance: 7: Independent Dynamic Sitting Balance Dynamic Sitting - Level of Assistance: 7: Independent Extremity/Trunk Assessment RUE Assessment RUE Assessment: Within Functional Limits LUE Assessment LUE Assessment: Within Functional Limits  See FIM for current functional status  Rich Brave 01/02/2013, 9:58 AM

## 2013-01-02 NOTE — Progress Notes (Signed)
Physical Therapy Discharge Summary  Patient Details  Name: Monique [name] MRN: [id number] Date of Birth: 03-26-1986  Today's Date: 01/02/2013 Time: 1400-1506 Time Calculation (min): 66 min  Pt performing multiple unlevel transfers throughout session only needing set up assist, otherwise she performs transfers at modified independent level. Pt fitted for her own rental wheelchair, leg rests adjusted by wheelchair representative. Car sliding board transfer performed with supervision. Demonstrated wheelchair breakdown and reviewed parts management with boyfriend. Pt propelled wheelchair x >300', 500' on unit and in lobby of hospital for general strengthening and negotiation. Pt able to negotiate [place] and purchase appropriate amount of cookies for money given, no verbal cues needed. Pt's boots doffed and PROM heel stretch performed with pt using towel to provide added stretch. Added light resisted band exercises to ankle ROM exercises (boyfriend practicing ROM to ankles for dorsiflexion/plantarflexion/eversion/inversion), once pt has improved motion resistance will be added (handout given). Both are pleased with progress and reports no further questions.  Patient has met 6 of 6 long term goals due to improved activity tolerance, improved balance, increased strength and ability to compensate for deficits.  Patient to discharge at a wheelchair level Supervision.   Patient's care partner is independent to provide the necessary physical and cognitive assistance at discharge.  Reasons goals not met: NA  Recommendation:  Patient will benefit from ongoing skilled PT services in home health settingonce she is able to bear weight through her ankles to continue to advance safe functional mobility, address ongoing impairments in decreased functional mobility, decreased ability to ambulate, decreased endurance, impaired ROM and strength of bil. Ankles, and minimize fall risk.  Equipment: wheelchair with  cushion, sliding board  Reasons for discharge: treatment goals met and discharge from hospital  Patient/family agrees with progress made and goals achieved: Yes  PT Discharge Precautions/Restrictions Precautions Precautions: Fall;Cervical Precaution Comments: C2 and sternal fx's.  MD cleared for FWB B UE's Required Braces or Orthoses: Cervical Brace Cervical Brace: Hard collar Restrictions Weight Bearing Restrictions: Yes RLE Weight Bearing: Non weight bearing LLE Weight Bearing: Non weight bearing Pain No c/o pain  Cognition Overall Cognitive Status: Impaired Arousal/Alertness: Awake/alert Orientation Level: Oriented X4 Attention: Selective Sustained Attention: Appears intact Selective Attention: Appears intact Selective Attention Impairment: Verbal basic;Functional basic Alternating Attention: Appears intact Memory: Impaired Memory Impairment: Decreased recall of new information;Decreased short term memory Decreased Short Term Memory: Verbal complex;Functional complex Awareness: Impaired Awareness Impairment: Anticipatory impairment Problem Solving: Appears intact Problem Solving Impairment: Verbal basic;Functional basic Behaviors: Poor frustration tolerance Safety/Judgment: Appears intact Rancho Mirant Scales of Cognitive Functioning: Purposeful/appropriate Sensation Sensation Light Touch: Appears Intact Coordination Gross Motor Movements are Fluid and Coordinated: Yes Fine Motor Movements are Fluid and Coordinated: Yes Motor  Motor Motor: Within Functional Limits  Mobility Bed Mobility Bed Mobility: Supine to Sit;Sit to Supine Supine to Sit: 6: Modified independent (Device/Increase time) Sitting - Scoot to Edge of Bed: 6: Modified independent (Device/Increase time) Sit to Supine: 6: Modified independent (Device/Increase time) Locomotion  Ambulation Ambulation: No (NWB bil. LEs) Corporate treasurer: Yes Wheelchair Assistance: 6:  Modified independent (Device/Increase time) Occupational hygienist: Both upper extremities Wheelchair Parts Management: Supervision/cueing Distance: >300   Games developer Sitting - Balance Support: Feet unsupported Static Sitting - Level of Assistance: 7: Independent Dynamic Sitting Balance Dynamic Sitting - Level of Assistance: 7: Independent Extremity Assessment      RLE Assessment RLE Assessment: Exceptions to [facility] RLE Strength RLE Overall Strength Comments: Pt just had cast removed today,  does not have full ROM of any ranges of motion. She is barely able to move bil. Feet against gravity. LLE Assessment LLE Assessment: Exceptions to [facility] LLE Strength LLE Overall Strength Comments: Pt just had cast removed today, does not have full ROM of any ranges of motion. She is barely able to move bil. Feet against gravity.  See FIM for current functional status  Wilhemina Bonito 01/02/2013, 3:36 PM

## 2013-01-02 NOTE — Progress Notes (Signed)
Orthopedic Tech Progress Note Patient Details:  Monique [name] 1986/08/04 [id number]  Patient ID: Monique [name], female   DOB: [birth year], 27 y.o.   MRN: [id number]   Monique [name] 01/02/2013, 11:34 AMCALLED ADVANCED FOR BILATERAL CAM WALKER BOOTS.

## 2013-01-02 NOTE — Progress Notes (Addendum)
Occupational Therapy Note  Patient Details  Name: Monique [name] MRN: [id number] Date of Birth: 1985/10/13 Today's Date: 01/02/2013  Time: 1315-1400 Pt denies pain Individual Therapy  Pt missed 15 mins skilled OT services while being fitted for Cam Boots.  Pt seated in w/c with boyfriend at side.  Discussed with patient the development of a daily routine and assisted patient with completing routine.  Educated patient and boyfriend with doffing and donning Cam boots.  Discussed energy conservation strategies when at home.  Pt expressed some anxiety of going home but was pleased with her progress.   Lavone Neri [facility] 01/02/2013, 2:30 PM

## 2013-01-02 NOTE — Progress Notes (Addendum)
Physical Therapy Session Note  Patient Details  Name: Monique [name] MRN: [id number] Date of Birth: November 02, 1985  Today's Date: 01/02/2013 Time: 1100-1130 Time Calculation (min): 30 min  Short Term Goals: Week 1:  PT Short Term Goal 1 (Week 1): STGs=LTGs  Skilled Therapeutic Interventions/Progress Updates:    Pt having bandages unwrapped being seen by MD during scheduled therapy time. Session spent reviewing/educating pt's boyfriend on wheelchair bump up/down steps and wheelchair bump up/down curb steps. Pt's boyfriend practiced verbal cues to give pt's father for the transfer.   Spoke with orthopaedics PA-C, he requested pt begin PROM/AROM exercises to bil. Ankles and add theraband exercises to HEP for ankles.   Therapy Documentation Precautions:  Precautions Precautions: Fall;Cervical Precaution Comments: C2 and sternal fx's.  MD cleared for FWB B UE's Required Braces or Orthoses: Cervical Brace Cervical Brace: Hard collar Restrictions Weight Bearing Restrictions: Yes RLE Weight Bearing: Non weight bearing LLE Weight Bearing: Non weight bearing Pain: Pain Assessment Pain Assessment: 0-10 Pain Score:   6 Pain Type: Acute pain Pain Location: Leg Pain Orientation: Right;Left Pain Descriptors: Aching Pain Frequency: Constant Pain Onset: On-going Patients Stated Pain Goal: 2 Pain Intervention(s): Medication (See eMAR) (oxycodone 10 mg po) Multiple Pain Sites: No  See FIM for current functional status  Therapy/Group: Individual Therapy  Wilhemina Bonito 01/02/2013, 12:02 PM

## 2013-01-02 NOTE — Progress Notes (Signed)
Subjective/Complaints: No new complaints. Slept well. A 12 point review of systems has been performed and if not noted above is otherwise negative.   Objective: Vital Signs: Blood pressure 99/59, pulse 80, temperature 98.1 F (36.7 C), temperature source Oral, resp. rate 18, height 5\' 5"  (1.651 m), weight 84.3 kg (185 lb 13.6 oz), last menstrual period 12/08/2012, SpO2 98.00%. No results found. No results found for this basename: WBC, HGB, HCT, PLT,  in the last 72 hours No results found for this basename: NA, K, CL, CO, GLUCOSE, BUN, CREATININE, CALCIUM,  in the last 72 hours CBG (last 3)  No results found for this basename: GLUCAP,  in the last 72 hours  Wt Readings from Last 3 Encounters:  12/31/12 84.3 kg (185 lb 13.6 oz)  12/17/12 91.1 kg (200 lb 13.4 oz)  12/17/12 91.1 kg (200 lb 13.4 oz)    Physical Exam:  Constitutional: She is oriented to person, place, and time. She appears well-developed and well-nourished.  HENT:  Head: Normocephalic.  Eyes: Pupils are equal, round, and reactive to light.  Neck:  Cervical collar in place. Fits appropriately  Cardiovascular: Normal rate and regular rhythm. No murmurs rubs or gallops  Pulmonary/Chest: Effort normal and breath sounds normal. No respiratory distress. She exhibits tenderness with deep breaths or palpation around sternum.  Abdominal: Soft. Bowel sounds are normal.  Musculoskeletal:  BLE with splints in place over both lower limbs. Tender over bilateral pecs Neurological: She is alert and oriented to person, place, and time. CN exam unremarkable. Follows commands without difficulty. Speech much clearer today.no sensory loss All 10 toes move . LE grossly 2 to 2+/5 at HF.  Reasonable insight and awareness. UE strength and sensory function remain within normal limits.  Skin: Skin is warm and dry except for a few abrasions.  Psychiatric: Judgment and thought content normal. Much more relaxed. In good spirits.       Assessment/Plan: 1. Functional deficits secondary to polytrauma which require 3+ hours per day of interdisciplinary therapy in a comprehensive inpatient rehab setting. Physiatrist is providing close team supervision and 24 hour management of active medical problems listed below. Physiatrist and rehab team continue to assess barriers to discharge/monitor patient progress toward functional and medical goals.  Pt will DC home Sat 01/03/13   FIM: FIM - Bathing Bathing Steps Patient Completed: Chest;Right Arm;Left Arm;Abdomen;Front perineal area;Buttocks;Left upper leg;Right upper leg Bathing: 5: Supervision: Safety issues/verbal cues  FIM - Upper Body Dressing/Undressing Upper body dressing/undressing steps patient completed: Thread/unthread right sleeve of pullover shirt/dresss;Put head through opening of pull over shirt/dress;Pull shirt over trunk;Thread/unthread left sleeve of pullover shirt/dress Upper body dressing/undressing: 5: Set-up assist to: Obtain clothing/put away FIM - Lower Body Dressing/Undressing Lower body dressing/undressing steps patient completed: Thread/unthread right underwear leg;Thread/unthread left underwear leg;Pull underwear up/down;Thread/unthread right pants leg;Thread/unthread left pants leg;Pull pants up/down Lower body dressing/undressing: 5: Set-up assist to: Obtain clothing  FIM - Toileting Toileting steps completed by patient: Adjust clothing prior to toileting;Performs perineal hygiene;Adjust clothing after toileting Toileting: 5: Supervision: Safety issues/verbal cues  FIM - Archivist Transfers Assistive Devices: Bedside commode;Sliding board Toilet Transfers: 5-To toilet/BSC: Supervision (verbal cues/safety issues);5-From toilet/BSC: Supervision (verbal cues/safety issues)  FIM - Banker Devices: Sliding board Bed/Chair Transfer: 5: Set-up assist to: Apply orthosis/W/C set-up  FIM -  Locomotion: Wheelchair Distance: >300 Locomotion: Wheelchair: 6: Travels 150 ft or more, turns around, maneuvers to table, bed or toilet, negotiates 3% grade: maneuvers on rugs and over door  sills independently FIM - Locomotion: Ambulation Ambulation/Gait Assistance: Not tested (comment) Locomotion: Ambulation: 0: Activity did not occur (NWB bil. LEs)  Comprehension Comprehension Mode: Auditory Comprehension: 6-Follows complex conversation/direction: With extra time/assistive device  Expression Expression Mode: Verbal Expression: 6-Expresses complex ideas: With extra time/assistive device  Social Interaction Social Interaction: 6-Interacts appropriately with others with medication or extra time (anti-anxiety, antidepressant).  Problem Solving Problem Solving: 6-Solves complex problems: With extra time  Memory Memory: 5-Requires cues to use assistive device Medical Problem List and Plan:  1. DVT Prophylaxis/Anticoagulation: Pharmaceutical: Coumadin- dc lovenox 2. Pain Management: Will continue ultram qid.  zanaflex   scheduled tid. Oxycodone prn severe pain. Working on schedule of meds. Pt likes to have a sense of control when it comes to meds 3. Mood: overall improved. Provide ego support and education.    4. Neuropsych: This patient is capable of making decisions on his/her own behalf.  5. ABLA: continue iron supplement tid  6. Constipation:bowels moving 7. Bilateral pilon fractures; NWB X 8 weeks. Ortho to follow up today 8. C2 fracture: Cervical collar X 3 months per Dr. Venetia Maxon.  9. Sternal fracture-minimally displaced with retrosternal contusion: conservative Tx. -reviewed the fact that she will have pain associated with this. She may feel some popping as well.  10. Neck mass: around thyroid. TSH normal. Will arrange U/S of neck once out of collar.  -will discuss with her when she sees me in office    LOS (Days) 7 A FACE TO FACE EVALUATION WAS PERFORMED  SWARTZ,ZACHARY  T 01/02/2013 7:52 AM

## 2013-01-02 NOTE — Progress Notes (Signed)
Orthopedic Tech Progress Note Patient Details:  Monique [name] 12-25-1985 [id number] Brace order completed by Advanced vendor [name]. Patient ID: [name], female   DOB: Apr 02, 1986, 27 y.o.   MRN: [id number]   [name] 01/02/2013, 7:38 PM

## 2013-01-02 NOTE — Progress Notes (Signed)
[facility] Progress Note        Subjective   Doing well Going home tomorrow  Notes some shooting pain in her feet   Objective  BP 99/59  Pulse 80  Temp(Src) 98.1 F (36.7 C) (Oral)  Resp 18  Ht 5\' 5"  (1.651 m)  Wt 84.3 kg (185 lb 13.6 oz)  BMI 30.93 kg/m2  SpO2 98%  LMP 12/08/2012  Patient Vitals for the past 24 hrs:  BP Temp Temp src Pulse Resp SpO2  01/02/13 0620 99/59 mmHg 98.1 F (36.7 C) Oral 80 18 98 %    Intake/Output     03/27 0701 - 03/28 0700 03/28 0701 - 03/29 0700   P.O.  120   Total Intake(mL/kg)  120 (1.4)   Net   +120          Exam  Gen: sitting in chair, appears comfortable Ext:    Bilateral Lower Extremities  Splints removed  Incisions look great  No signs of infection   Distal motor and sensory functions grossly intact  Swelling stable  + DP pulses    Assessment and Plan  27 y/o s/p ORIF B pilon fxs 9 days ago  Removed splints Ordered B cam boots.  Pt can be out of boots for ROM NWB for another 7 weeks Daily dressing changes Can wash wounds with soap and water A/AROM, PROM as tolerated Therabands ok, heel cord stretching HEP  Follow up in 7 days at [facility]  Pt to call for appointment   Mearl Latin, PA-C [facility] [phone] (P) 01/02/2013 11:22 AM
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
## 2013-01-02 NOTE — Progress Notes (Signed)
Rx for Oxycodone 10 mg # 120 given.  To use 1/2 to 1 pill q 6 hours prn pain.

## 2013-01-02 NOTE — Progress Notes (Signed)
Occupational Therapy Session Note  Patient Details  Name: Monique [name] MRN: [id number] Date of Birth: 04/19/1986  Today's Date: 01/02/2013 Time: 1130-1200 Time Calculation (min): 30 min  Short Term Goals: Week 1:  OT Short Term Goal 1 (Week 1): PT WILL GROOM SELF AS SUPERVISION LEVEL AT SINK IN WC OT Short Term Goal 2 (Week 1): PT WILL BATHE AT SUPERVISION LEVEL OT Short Term Goal 3 (Week 1): PT WIL DRESS UB AT MINIMAL ASSIST LEVEL OT Short Term Goal 4 (Week 1): PT. WILL DRESS LB AT MINIMAL ASSIST LEVEL OT Short Term Goal 5 (Week 1): PT WILL TRANSFER TO DROPPED ARM BSC WITH MINIMAL ASSIST.  Skilled Therapeutic Interventions/Progress Updates:    1:1 Therapeutic exercise with focus on beginning to teach ROM (passive and active) to bilateral ankles; demonstrating to pt and pt's boyfriend; discussed importance of keeping them elevated, use and importance of ice and ROM daily, recommendation for further therapy. Pt able to participate in session in dayroom, a moderately distracting environment.  Therapy Documentation Precautions:  Precautions Precautions: Fall;Cervical Precaution Comments: C2 and sternal fx's.  MD cleared for FWB B UE's Required Braces or Orthoses: Cervical Brace Cervical Brace: Hard collar Restrictions Weight Bearing Restrictions: Yes RLE Weight Bearing: Non weight bearing LLE Weight Bearing: Non weight bearing Pain: Pain Assessment Pain Assessment: 0-10 Pain Score:   6 Pain Type: Acute pain Pain Location: Leg Pain Orientation: Right;Left Pain Descriptors: Aching Pain Frequency: Constant Pain Onset: On-going Patients Stated Pain Goal: 2 Pain Intervention(s): Medication (See eMAR) (oxycodone 10 mg po)  See FIM for current functional status  Therapy/Group: Individual Therapy  Roney Mans [facility] 01/02/2013, 1:23 PM

## 2013-01-02 NOTE — Progress Notes (Signed)
Occupational Therapy Session Note  Patient Details  Name: Monique [name] MRN: [id number] Date of Birth: Nov 13, 1985  Today's Date: 01/02/2013 Time: 0900-0955 Time Calculation (min): 55 min  Short Term Goals: Week 1:  OT Short Term Goal 1 (Week 1): PT WILL GROOM SELF AS SUPERVISION LEVEL AT SINK IN WC OT Short Term Goal 2 (Week 1): PT WILL BATHE AT SUPERVISION LEVEL OT Short Term Goal 3 (Week 1): PT WIL DRESS UB AT MINIMAL ASSIST LEVEL OT Short Term Goal 4 (Week 1): PT. WILL DRESS LB AT MINIMAL ASSIST LEVEL OT Short Term Goal 5 (Week 1): PT WILL TRANSFER TO DROPPED ARM BSC WITH MINIMAL ASSIST.  Skilled Therapeutic Interventions/Progress Updates:    Pt engaged in bathing and dressing tasks with set up assistance and supervision from boyfriend.  Pt and boyfriend completed all tasks safely and independently.  Discussed schedule at home and assistance available.  Focus on continued family education and safety awareness.  Therapy Documentation Precautions:  Precautions Precautions: Fall;Cervical Precaution Comments: C2 and sternal fx's.  MD cleared for FWB B UE's Required Braces or Orthoses: Cervical Brace Cervical Brace: Hard collar Restrictions Weight Bearing Restrictions: Yes RLE Weight Bearing: Non weight bearing LLE Weight Bearing: Non weight bearing Pain: Pain Assessment Pain Assessment: No/denies pain Pain Score:   3 Pain Type: Acute pain Pain Location: Leg Pain Orientation: Right;Left Pain Descriptors: Aching Pain Frequency: Constant Pain Onset: On-going Patients Stated Pain Goal: 3 Pain Intervention(s): Medication (See eMAR)  See FIM for current functional status  Therapy/Group: Individual Therapy  Rich Brave 01/02/2013, 9:55 AM

## 2013-01-03 DIAGNOSIS — S82899A Other fracture of unspecified lower leg, initial encounter for closed fracture: Secondary | ICD-10-CM

## 2013-01-03 LAB — PROTIME-INR
INR: 2.33 — ABNORMAL HIGH (ref 0.00–1.49)
Prothrombin Time: 24.5 seconds — ABNORMAL HIGH (ref 11.6–15.2)

## 2013-01-03 MED ORDER — WARFARIN SODIUM 7.5 MG PO TABS
7.5000 mg | ORAL_TABLET | Freq: Every day | ORAL | Status: DC
  Filled 2013-01-03: qty 1

## 2013-01-03 NOTE — Progress Notes (Signed)
Patient ID: Monique [name], female   DOB: 05/15/1986, 27 y.o.   MRN: [id number] Monique [name] is a 27 y.o. female [birth year] [id number]  Subjective: No new complaints. No new problems. Slept well. Feeling OK. Asking about Dulcolax  Objective: Vital signs in last 24 hours: Temp:  [98.1 F (36.7 C)-98.4 F (36.9 C)] 98.4 F (36.9 C) (03/29 0600) Pulse Rate:  [67] 67 (03/29 0600) Resp:  [16-18] 18 (03/29 0600) BP: (91-106)/(56-57) 91/56 mmHg (03/29 0600) SpO2:  [98 %-99 %] 99 % (03/29 0600) Weight change:  Last BM Date: 01/02/13  Intake/Output from previous day: 03/28 0701 - 03/29 0700 In: 240 [P.O.:240] Out: -  Last cbgs: CBG (last 3)  No results found for this basename: GLUCAP,  in the last 72 hours   Physical Exam General: No apparent distress    HEENT: moist mucosa Lungs: Normal effort. Lungs clear to auscultation, no crackles or wheezes. Cardiovascular: Regular rate and rhythm, no edema Musculoskeletal:  No change from before Neurological: No new neurological deficits abd - s/nt Wounds: n/a Skin: clear Alert, cooperative   Lab Results: BMET    Component Value Date/Time   NA 136 12/29/2012 0720   K 4.1 12/29/2012 0720   CL 96 12/29/2012 0720   CO2 25 12/29/2012 0720   GLUCOSE 84 12/29/2012 0720   BUN 10 12/29/2012 0720   CREATININE 0.64 12/29/2012 0720   CALCIUM 9.6 12/29/2012 0720   GFRNONAA >90 12/29/2012 0720   GFRAA >90 12/29/2012 0720   CBC    Component Value Date/Time   WBC 7.1 12/29/2012 0720   RBC 3.82* 12/29/2012 0720   HGB 9.8* 12/29/2012 0720   HCT 30.7* 12/29/2012 0720   PLT 514* 12/29/2012 0720   MCV 80.4 12/29/2012 0720   MCH 25.7* 12/29/2012 0720   MCHC 31.9 12/29/2012 0720   RDW 13.9 12/29/2012 0720   LYMPHSABS 2.6 12/29/2012 0720   MONOABS 0.6 12/29/2012 0720   EOSABS 0.3 12/29/2012 0720   BASOSABS 0.0 12/29/2012 0720    Studies/Results: No results found.  Medications: I have reviewed the patient's current  medications.  Assessment/Plan:  1. DVT Prophylaxis/Anticoagulation: Pharmaceutical: Coumadin- dc lovenox  2. Pain Management: Will continue ultram qid. zanaflex scheduled tid. Oxycodone prn severe pain. Working on schedule of meds. Pt likes to have a sense of control when it comes to meds  3. Mood: overall improved. Provide ego support and education.  4. Neuropsych: This patient is capable of making decisions on his/her own behalf.  5. ABLA: continue iron supplement tid  6. Constipation:bowels moving - Dulcolax 7. Bilateral pilon fractures; NWB X 8 weeks. Ortho to follow up today  8. C2 fracture: Cervical collar X 3 months per Dr. Venetia Maxon.  9. Sternal fracture-minimally displaced with retrosternal contusion: conservative Tx. -reviewed the fact that she will have pain associated with this. She may feel some popping as well.  10. Neck mass: around thyroid. TSH normal. Will arrange U/S of neck once out of collar.  -will discuss with her when she sees me in office      Length of stay, days: 8  Sonda Primes , MD 01/03/2013, 8:34 AM

## 2013-01-03 NOTE — Progress Notes (Signed)
ANTICOAGULATION CONSULT NOTE - Follow Up Consult  Pharmacy Consult for coumadin Indication: VTE prophylaxis  No Known Allergies  Patient Measurements: Height: 5\' 5"  (165.1 cm) Weight: 185 lb 13.6 oz (84.3 kg) IBW/kg (Calculated) : 57 Heparin Dosing Weight:   Vital Signs: Temp: 98.4 F (36.9 C) (03/29 0600) BP: 91/56 mmHg (03/29 0600) Pulse Rate: 67 (03/29 0600)  Labs:  Recent Labs  01/01/13 0615 01/02/13 0630 01/03/13 0545  LABPROT 24.8* 23.4* 24.5*  INR 2.37* 2.19* 2.33*   Estimated Creatinine Clearance: 112.2 ml/min (by C-G formula based on Cr of 0.64).  Medications:  Scheduled:  . bisacodyl  10 mg Oral Daily  . chlorhexidine  15 mL Mouth/Throat BID  . ferrous sulfate  325 mg Oral TID PC  . polyethylene glycol  17 g Oral BID  . tiZANidine  4 mg Oral TID  . [COMPLETED] tiZANidine  4 mg Oral Once  . traMADol  100 mg Oral Q6H  . [COMPLETED] warfarin  7.5 mg Oral ONCE-1800  . warfarin   Does not apply Once  . Warfarin - Pharmacist Dosing Inpatient   Does not apply q1800   Assessment: 27 years s/p ORIF is currently on therapeutic coumadin.  INR is 2.33 without noted bleeding complications.  Goal of Therapy:  INR 2-3   Plan:  1) Coumadin 7.5mg  po daily. 2) Change INR to MWF  Nadara Mustard, PharmD., MS Clinical Pharmacist Pager:  [phone] Thank you for allowing pharmacy to be part of this patients care team. 01/03/2013,8:03 AM

## 2013-01-05 NOTE — Progress Notes (Signed)
Social Work  Discharge Note  The overall goal for the admission was met for:   Discharge location: Yes - home with father and boyfriend to provide 24/7 assist  Length of Stay: Yes - 8 days  Discharge activity level: Yes - supervision to mod i overal  Home/community participation: Yes  Services provided included: MD, RD, PT, OT, SLP, RN, TR, Pharmacy and SW  Financial Services: Medicaid  Follow-up services arranged: Home Health: RN via [facility] (recommend hold on therapies until WB allowed) DME: 18x18 Breezy w/c with ELR, basic cushion, semi-elec. Hospital bed, drop arm commode via [facility]  Comments (or additional information):  Patient/Family verbalized understanding of follow-up arrangements: Yes  Individual responsible for coordination of the follow-up plan: patient  Confirmed correct DME delivered: Monique [name] 01/05/2013    Monique [name]

## 2013-02-06 ENCOUNTER — Encounter: Payer: Medicaid Other | Attending: Physical Medicine & Rehabilitation | Admitting: Physical Medicine & Rehabilitation

## 2013-02-06 ENCOUNTER — Encounter: Payer: Self-pay | Admitting: Physical Medicine & Rehabilitation

## 2013-02-06 VITALS — BP 118/71 | HR 79 | Resp 14 | Ht 65.0 in | Wt 185.0 lb

## 2013-02-06 DIAGNOSIS — R220 Localized swelling, mass and lump, head: Secondary | ICD-10-CM | POA: Insufficient documentation

## 2013-02-06 DIAGNOSIS — IMO0002 Reserved for concepts with insufficient information to code with codable children: Secondary | ICD-10-CM

## 2013-02-06 DIAGNOSIS — S2220XA Unspecified fracture of sternum, initial encounter for closed fracture: Secondary | ICD-10-CM | POA: Insufficient documentation

## 2013-02-06 DIAGNOSIS — S82891D Other fracture of right lower leg, subsequent encounter for closed fracture with routine healing: Secondary | ICD-10-CM

## 2013-02-06 DIAGNOSIS — S8290XD Unspecified fracture of unspecified lower leg, subsequent encounter for closed fracture with routine healing: Secondary | ICD-10-CM

## 2013-02-06 DIAGNOSIS — S82899A Other fracture of unspecified lower leg, initial encounter for closed fracture: Secondary | ICD-10-CM | POA: Insufficient documentation

## 2013-02-06 DIAGNOSIS — S12100A Unspecified displaced fracture of second cervical vertebra, initial encounter for closed fracture: Secondary | ICD-10-CM | POA: Insufficient documentation

## 2013-02-06 DIAGNOSIS — R221 Localized swelling, mass and lump, neck: Secondary | ICD-10-CM

## 2013-02-06 DIAGNOSIS — S12100D Unspecified displaced fracture of second cervical vertebra, subsequent encounter for fracture with routine healing: Secondary | ICD-10-CM

## 2013-02-06 DIAGNOSIS — X58XXXA Exposure to other specified factors, initial encounter: Secondary | ICD-10-CM | POA: Insufficient documentation

## 2013-02-06 MED ORDER — OXYCODONE HCL 10 MG PO TABS: 5.0000 mg | ORAL_TABLET | Freq: Four times a day (QID) | ORAL | Status: DC | PRN
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
## 2013-02-06 NOTE — Patient Instructions (Signed)
CALL ME WITH ANY PROBLEMS OR QUESTIONS (#[phone]).  HAVE A GOOD DAY  

## 2013-03-10 ENCOUNTER — Ambulatory Visit
Admission: RE | Admit: 2013-03-10 | Discharge: 2013-03-10 | Disposition: A | Discharge status: Home / Self Care | Payer: Medicaid Other | Source: Ambulatory Visit | Attending: Physical Medicine & Rehabilitation | Admitting: Physical Medicine & Rehabilitation

## 2013-03-10 DIAGNOSIS — R221 Localized swelling, mass and lump, neck: Secondary | ICD-10-CM

## 2013-03-23 ENCOUNTER — Encounter: Payer: Medicaid Other | Attending: Physical Medicine & Rehabilitation | Admitting: Physical Medicine & Rehabilitation

## 2013-03-23 DIAGNOSIS — S8290XD Unspecified fracture of unspecified lower leg, subsequent encounter for closed fracture with routine healing: Secondary | ICD-10-CM | POA: Insufficient documentation

## 2013-03-23 DIAGNOSIS — S8290XS Unspecified fracture of unspecified lower leg, sequela: Secondary | ICD-10-CM | POA: Insufficient documentation

## 2013-03-23 DIAGNOSIS — R220 Localized swelling, mass and lump, head: Secondary | ICD-10-CM | POA: Insufficient documentation

## 2013-03-23 DIAGNOSIS — R221 Localized swelling, mass and lump, neck: Secondary | ICD-10-CM | POA: Insufficient documentation

## 2013-03-23 DIAGNOSIS — X58XXXA Exposure to other specified factors, initial encounter: Secondary | ICD-10-CM | POA: Insufficient documentation

## 2013-03-23 DIAGNOSIS — IMO0002 Reserved for concepts with insufficient information to code with codable children: Secondary | ICD-10-CM | POA: Insufficient documentation

## 2013-03-23 DIAGNOSIS — Z79899 Other long term (current) drug therapy: Secondary | ICD-10-CM | POA: Insufficient documentation

## 2013-03-23 DIAGNOSIS — Z5181 Encounter for therapeutic drug level monitoring: Secondary | ICD-10-CM | POA: Insufficient documentation

## 2013-03-23 DIAGNOSIS — G5780 Other specified mononeuropathies of unspecified lower limb: Secondary | ICD-10-CM | POA: Insufficient documentation

## 2013-03-31 ENCOUNTER — Encounter (HOSPITAL_BASED_OUTPATIENT_CLINIC_OR_DEPARTMENT_OTHER): Payer: Medicaid Other | Admitting: Physical Medicine & Rehabilitation

## 2013-03-31 ENCOUNTER — Encounter: Payer: Self-pay | Admitting: Physical Medicine & Rehabilitation

## 2013-03-31 VITALS — BP 127/52 | HR 81 | Resp 16 | Ht 65.0 in | Wt 184.0 lb

## 2013-03-31 DIAGNOSIS — S8290XD Unspecified fracture of unspecified lower leg, subsequent encounter for closed fracture with routine healing: Secondary | ICD-10-CM

## 2013-03-31 DIAGNOSIS — IMO0002 Reserved for concepts with insufficient information to code with codable children: Secondary | ICD-10-CM

## 2013-03-31 DIAGNOSIS — R220 Localized swelling, mass and lump, head: Secondary | ICD-10-CM

## 2013-03-31 DIAGNOSIS — S12100D Unspecified displaced fracture of second cervical vertebra, subsequent encounter for fracture with routine healing: Secondary | ICD-10-CM

## 2013-03-31 DIAGNOSIS — Z5181 Encounter for therapeutic drug level monitoring: Secondary | ICD-10-CM

## 2013-03-31 DIAGNOSIS — R221 Localized swelling, mass and lump, neck: Secondary | ICD-10-CM

## 2013-03-31 DIAGNOSIS — G5740 Lesion of medial popliteal nerve, unspecified lower limb: Secondary | ICD-10-CM

## 2013-03-31 DIAGNOSIS — S82891D Other fracture of right lower leg, subsequent encounter for closed fracture with routine healing: Secondary | ICD-10-CM

## 2013-03-31 DIAGNOSIS — S8290XS Unspecified fracture of unspecified lower leg, sequela: Secondary | ICD-10-CM

## 2013-03-31 DIAGNOSIS — Z79899 Other long term (current) drug therapy: Secondary | ICD-10-CM

## 2013-03-31 DIAGNOSIS — S82891S Other fracture of right lower leg, sequela: Secondary | ICD-10-CM

## 2013-03-31 DIAGNOSIS — G5780 Other specified mononeuropathies of unspecified lower limb: Secondary | ICD-10-CM

## 2013-03-31 MED ORDER — MELOXICAM 15 MG PO TABS: 15.0000 mg | ORAL_TABLET | Freq: Every day | ORAL | Status: DC

## 2013-03-31 MED ORDER — OXYCODONE HCL 10 MG PO TABS: 5.0000 mg | ORAL_TABLET | Freq: Four times a day (QID) | ORAL | Status: DC | PRN

## 2013-03-31 MED ORDER — GABAPENTIN 300 MG PO CAPS: 300.0000 mg | ORAL_CAPSULE | Freq: Three times a day (TID) | ORAL | Status: DC

## 2013-03-31 NOTE — Patient Instructions (Signed)
CALL ME WITH ANY PROBLEMS OR QUESTIONS (#[phone]).  HAVE A GOOD DAY  
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
## 2013-04-02 ENCOUNTER — Encounter (INDEPENDENT_AMBULATORY_CARE_PROVIDER_SITE_OTHER): Payer: Medicaid Other | Admitting: General Surgery

## 2013-04-14 ENCOUNTER — Ambulatory Visit (INDEPENDENT_AMBULATORY_CARE_PROVIDER_SITE_OTHER): Payer: Medicaid Other | Admitting: General Surgery

## 2013-04-14 ENCOUNTER — Encounter (INDEPENDENT_AMBULATORY_CARE_PROVIDER_SITE_OTHER): Payer: Self-pay | Admitting: General Surgery

## 2013-04-14 VITALS — BP 100/72 | HR 74 | Temp 97.3°F | Resp 16 | Ht 65.0 in | Wt 187.6 lb

## 2013-04-14 DIAGNOSIS — E041 Nontoxic single thyroid nodule: Secondary | ICD-10-CM
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
## 2013-04-15 ENCOUNTER — Encounter (INDEPENDENT_AMBULATORY_CARE_PROVIDER_SITE_OTHER): Payer: Self-pay | Admitting: General Surgery

## 2013-04-21 ENCOUNTER — Inpatient Hospital Stay
Admission: RE | Admit: 2013-04-21 | Discharge: 2013-04-21 | Disposition: A | Discharge status: Home / Self Care | Payer: Medicaid Other | Source: Ambulatory Visit | Attending: General Surgery | Admitting: General Surgery

## 2013-04-30 ENCOUNTER — Inpatient Hospital Stay: Admission: RE | Admit: 2013-04-30 | Payer: Medicaid Other | Source: Ambulatory Visit

## 2013-05-01 ENCOUNTER — Encounter (INDEPENDENT_AMBULATORY_CARE_PROVIDER_SITE_OTHER): Payer: Medicaid Other | Admitting: General Surgery

## 2013-05-06 ENCOUNTER — Ambulatory Visit
Admission: RE | Admit: 2013-05-06 | Discharge: 2013-05-06 | Disposition: A | Discharge status: Home / Self Care | Payer: Medicaid Other | Source: Ambulatory Visit | Attending: General Surgery | Admitting: General Surgery

## 2013-05-06 ENCOUNTER — Encounter: Payer: Medicaid Other | Attending: Physical Medicine & Rehabilitation | Admitting: Physical Medicine & Rehabilitation

## 2013-05-06 ENCOUNTER — Other Ambulatory Visit (HOSPITAL_COMMUNITY)
Admission: RE | Admit: 2013-05-06 | Discharge: 2013-05-06 | Disposition: A | Discharge status: Home / Self Care | Payer: Medicaid Other | Source: Ambulatory Visit | Attending: Interventional Radiology | Admitting: Interventional Radiology

## 2013-05-06 ENCOUNTER — Encounter: Payer: Self-pay | Admitting: Physical Medicine & Rehabilitation

## 2013-05-06 VITALS — BP 140/90 | HR 75 | Resp 16 | Ht 65.0 in | Wt 189.0 lb

## 2013-05-06 DIAGNOSIS — S12100D Unspecified displaced fracture of second cervical vertebra, subsequent encounter for fracture with routine healing: Secondary | ICD-10-CM

## 2013-05-06 DIAGNOSIS — E041 Nontoxic single thyroid nodule: Secondary | ICD-10-CM

## 2013-05-06 DIAGNOSIS — X58XXXA Exposure to other specified factors, initial encounter: Secondary | ICD-10-CM | POA: Insufficient documentation

## 2013-05-06 DIAGNOSIS — IMO0002 Reserved for concepts with insufficient information to code with codable children: Secondary | ICD-10-CM | POA: Insufficient documentation

## 2013-05-06 DIAGNOSIS — S8290XS Unspecified fracture of unspecified lower leg, sequela: Secondary | ICD-10-CM | POA: Insufficient documentation

## 2013-05-06 DIAGNOSIS — S82892D Other fracture of left lower leg, subsequent encounter for closed fracture with routine healing: Secondary | ICD-10-CM

## 2013-05-06 DIAGNOSIS — G5780 Other specified mononeuropathies of unspecified lower limb: Secondary | ICD-10-CM | POA: Insufficient documentation

## 2013-05-06 DIAGNOSIS — S8290XD Unspecified fracture of unspecified lower leg, subsequent encounter for closed fracture with routine healing: Secondary | ICD-10-CM | POA: Insufficient documentation

## 2013-05-06 DIAGNOSIS — R221 Localized swelling, mass and lump, neck: Secondary | ICD-10-CM

## 2013-05-06 DIAGNOSIS — S82891D Other fracture of right lower leg, subsequent encounter for closed fracture with routine healing: Secondary | ICD-10-CM

## 2013-05-06 DIAGNOSIS — G5740 Lesion of medial popliteal nerve, unspecified lower limb: Secondary | ICD-10-CM

## 2013-05-06 DIAGNOSIS — S82892S Other fracture of left lower leg, sequela: Secondary | ICD-10-CM

## 2013-05-06 DIAGNOSIS — S82891S Other fracture of right lower leg, sequela: Secondary | ICD-10-CM

## 2013-05-06 MED ORDER — TOPIRAMATE 25 MG PO TABS: 25.0000 mg | ORAL_TABLET | Freq: Two times a day (BID) | ORAL | Status: DC

## 2013-05-06 MED ORDER — OXYCODONE HCL 10 MG PO TABS: 5.0000 mg | ORAL_TABLET | Freq: Four times a day (QID) | ORAL | Status: DC | PRN
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
## 2013-05-06 NOTE — Patient Instructions (Signed)
GABAPENTIN:---STARTING TOMORROW TAKE ONE TWICE DAILY FOR 4 DAYS, THEN ONE AT BEDTIME FOR DAYS, THEN STOP

## 2013-05-08 ENCOUNTER — Telehealth: Payer: Self-pay

## 2013-05-08 MED ORDER — TIZANIDINE HCL 4 MG PO TABS: 4.0000 mg | ORAL_TABLET | Freq: Three times a day (TID) | ORAL | Status: DC

## 2013-05-08 NOTE — Telephone Encounter (Signed)
Patient called for zanaflex refill.   This was electronically sent to [facility].

## 2013-06-19 ENCOUNTER — Telehealth: Payer: Self-pay

## 2013-06-19 NOTE — Telephone Encounter (Signed)
Patient called requesting oxycodone 10 refill.  Spoke with patient and she has made an appointment to discuss refills.

## 2013-06-22 ENCOUNTER — Encounter: Payer: Medicaid Other | Attending: Physical Medicine & Rehabilitation | Admitting: Physical Medicine & Rehabilitation

## 2013-06-22 ENCOUNTER — Encounter: Payer: Self-pay | Admitting: Physical Medicine & Rehabilitation

## 2013-06-22 VITALS — BP 109/67 | HR 78 | Resp 14 | Ht 65.0 in | Wt 191.0 lb

## 2013-06-22 DIAGNOSIS — IMO0002 Reserved for concepts with insufficient information to code with codable children: Secondary | ICD-10-CM | POA: Insufficient documentation

## 2013-06-22 DIAGNOSIS — S82891D Other fracture of right lower leg, subsequent encounter for closed fracture with routine healing: Secondary | ICD-10-CM

## 2013-06-22 DIAGNOSIS — S12100D Unspecified displaced fracture of second cervical vertebra, subsequent encounter for fracture with routine healing: Secondary | ICD-10-CM

## 2013-06-22 DIAGNOSIS — S8290XS Unspecified fracture of unspecified lower leg, sequela: Secondary | ICD-10-CM | POA: Insufficient documentation

## 2013-06-22 DIAGNOSIS — S069X9S Unspecified intracranial injury with loss of consciousness of unspecified duration, sequela: Secondary | ICD-10-CM

## 2013-06-22 DIAGNOSIS — S8290XD Unspecified fracture of unspecified lower leg, subsequent encounter for closed fracture with routine healing: Secondary | ICD-10-CM | POA: Insufficient documentation

## 2013-06-22 DIAGNOSIS — G5780 Other specified mononeuropathies of unspecified lower limb: Secondary | ICD-10-CM

## 2013-06-22 DIAGNOSIS — S060X2S Concussion with loss of consciousness of 31 minutes to 59 minutes, sequela: Secondary | ICD-10-CM

## 2013-06-22 DIAGNOSIS — S82891S Other fracture of right lower leg, sequela: Secondary | ICD-10-CM

## 2013-06-22 DIAGNOSIS — G5740 Lesion of medial popliteal nerve, unspecified lower limb: Secondary | ICD-10-CM

## 2013-06-22 DIAGNOSIS — S12100S Unspecified displaced fracture of second cervical vertebra, sequela: Secondary | ICD-10-CM

## 2013-06-22 DIAGNOSIS — X58XXXA Exposure to other specified factors, initial encounter: Secondary | ICD-10-CM | POA: Insufficient documentation

## 2013-06-22 MED ORDER — MELOXICAM 15 MG PO TABS: 15.0000 mg | ORAL_TABLET | Freq: Every day | ORAL | Status: DC

## 2013-06-22 MED ORDER — OXYCODONE HCL 10 MG PO TABS: 5.0000 mg | ORAL_TABLET | Freq: Four times a day (QID) | ORAL | Status: DC | PRN

## 2013-06-22 NOTE — Patient Instructions (Signed)
TRY OVER THE ANKLE SHOES WITH FLAT SOLE, GOOD ARCH SUPPORTS  TAKE YOUR MELOXICAM DAILY.  WORK ON REGULAR STRETCHING OF YOUR ANKLES.  BUILD IN REST BREAKS AS POSSIBLE.

## 2013-06-22 NOTE — Progress Notes (Signed)
Subjective:    Patient ID: Monique [name], female    DOB: March 30, 1986, 27 y.o.   MRN: [id number]  HPI  Mrs. Ishibashi is back regarding her polytrauma. She is still having pain in the right foot/ankle more than the left. She stopped the topamax and meloxicam. She still uses the oxycodone but sparingly.    She has children who keep her busy. The daily activities are quite stressful and tiring for here.   She saw Dr. Victorino Dike for a second ortho opinion and he offered her an injection,  But she said there wasn't much else he could do for her.  Thyroid nodule bx was + for benign follicular cyst  Pain Inventory Average Pain 6 Pain Right Now 7 My pain is sharp, stabbing and tingling  In the last 24 hours, has pain interfered with the following? General activity 9 Relation with others 9 Enjoyment of life 9 What TIME of day is your pain at its worst? morning, evening and night Sleep (in general) Fair  Pain is worse with: walking, bending and some activites Pain improves with: heat/ice and medication Relief from Meds: 4  Mobility ability to climb steps?  no do you drive?  no Do you have any goals in this area?  yes  Function not employed: date last employed . I need assistance with the following:  bathing, meal prep and household duties Do you have any goals in this area?  yes  Neuro/Psych confusion anxiety  Prior Studies Any changes since last visit?  no  Physicians involved in your care Any changes since last visit?  no   Family History  Problem Relation Age of Onset  . Cancer Paternal Grandmother     Ovarian   History   Social History  . Marital Status: Single    Spouse Name: N/A    Number of Children: N/A  . Years of Education: N/A   Social History Main Topics  . Smoking status: Passive Smoke Exposure - Never Smoker  . Smokeless tobacco: Never Used  . Alcohol Use: Yes     Comment: occasional  . Drug Use: No  . Sexual Activity: Yes   Other Topics  Concern  . None   Social History Narrative  . None   Past Surgical History  Procedure Laterality Date  . Orif ankle fracture Left 12/14/2012    Procedure: OPEN REDUCTION INTERNAL FIXATION (ORIF) ANKLE FRACTURE;  Surgeon: Shelda Pal, MD;  Location: [facility] OR;  Service: Orthopedics;  Laterality: Left;  . I&d extremity Left 12/14/2012    Procedure: IRRIGATION AND DEBRIDEMENT EXTREMITY;  Surgeon: Shelda Pal, MD;  Location: [facility] OR;  Service: Orthopedics;  Laterality: Left;  . External fixation leg Bilateral 12/14/2012    Procedure: EXTERNAL FIXATION LEG;  Surgeon: Shelda Pal, MD;  [facility] OR;  Service: Orthopedics;  Laterality: Bilateral;  . Appendectomy    . Ceasarean section  2007, 2010  . Orif tibia fracture Bilateral 12/23/2012    Procedure: OPEN REDUCTION INTERNAL FIXATION (ORIF) BILATERAL DISTAL TIBIA FRACTURE;  Surgeon: Budd Palmer, MD;  Location: [facility] OR;  Service: Orthopedics;  Laterality: Bilateral;  . External fixation removal Bilateral 12/23/2012    Procedure: REMOVAL EXTERNAL FIXATION LEG;  Surgeon: Budd Palmer, MD;  Location: [facility] OR;  Service: Orthopedics;  Laterality: Bilateral;   Past Medical History  Diagnosis Date  . Depression   . Anxiety   . Heart murmur   . Anemia    BP 109/67  Pulse 78  Resp 14  Ht 5\' 5"  (1.651 m)  Wt 191 lb (86.637 kg)  BMI 31.78 kg/m2  SpO2 78%  LMP 05/24/2013     Review of Systems  Musculoskeletal: Positive for gait problem.  Psychiatric/Behavioral: Positive for confusion. The patient is nervous/anxious.   All other systems reviewed and are negative.       Objective:   Physical Exam  General: Alert and oriented x 3, No apparent distress  HEENT: Head is normocephalic, atraumatic, PERRLA, EOMI, sclera anicteric, oral mucosa pink and moist, dentition intact, ext ear canals clear,  Neck: Supple without JVD or lymphadenopathy  Heart: Reg rate and rhythm. No murmurs rubs or gallops  Chest: CTA bilaterally without wheezes,  rales, or rhonchi; no distress  Abdomen: Soft, non-tender, non-distended, bowel sounds positive.  Extremities: No clubbing, cyanosis, notable edema as below. Pulses are 2+  Skin: Clean and intact without signs of breakdown  Neuro: Pt is cognitively appropriate with normal insight, memory, and awareness. Cranial nerves 2-12 are intact. Sensory exam is normal except for the bases of the feet which are hypersensitive to touch and lack FT discrimination. Reflexes are 2+ in all 4's. UE motor 5/5. LE: 4/5 HF, KE, 2-3 at ankles--appears to have intrinsic muscle atrophy, with hammer toe deformities right more than left. She  flex, and extend them with at least 3+ to 4/5. She has intact inv/eversion. They are passively stretched easily left more than right.  Musculoskeletal: Callus noted at ankles, with associated swelling as well. Ankles were somewhat tender to palpation. ROM remains limited. She walks with little ADF and has to place the right/left leg far out in front with the knee fully extended to allow weight bearing through the right/left foot. Antalgia is noted on either leg. Both heel cords are tight and she is -10 passive DF on right and -5 on the left. Psych: Pt's affect is appropriate. Pt is cooperative   Assessment & Plan:   1. DVT Prophylaxis/Anticoagulation: Pharmaceutical: Coumadin-Continue until she is routinely walking around her home  2. Pain Management/ tibial nerve injuries: Refilled oxycodone 10mg  today #60. A CSA was signed   3. Bilateral pilon fractures  -recommended better shoeware, perhaps high-top shoes with ample insole support   -aggressive ROM to the ankles to improve ADF  -scheduled Meloxicam which she agreed to take   -adaptive strategies including sitting/rest breaks, etcd  -she will have to make some long term changes given the severity of her injury  4. C2 fracture: healed  5. Mild TBI 6. Neck mass: around thyroid. TSH normal. U/S findings as above.  -bx c/w benign  cystic follicle 7. Follow up in 23months time. 15 minutes of face to face patient care time were spent during this visit. All questions were encouraged and answered.

## 2013-07-07 ENCOUNTER — Ambulatory Visit: Payer: Medicaid Other | Admitting: Physical Medicine & Rehabilitation

## 2013-09-16 ENCOUNTER — Encounter: Payer: Self-pay | Admitting: Physical Medicine & Rehabilitation

## 2013-09-16 ENCOUNTER — Encounter: Payer: Medicaid Other | Attending: Physical Medicine & Rehabilitation | Admitting: Physical Medicine & Rehabilitation

## 2013-09-16 VITALS — BP 117/64 | HR 82 | Resp 14 | Ht 65.0 in | Wt 191.0 lb

## 2013-09-16 DIAGNOSIS — S069XAS Unspecified intracranial injury with loss of consciousness status unknown, sequela: Secondary | ICD-10-CM | POA: Insufficient documentation

## 2013-09-16 DIAGNOSIS — S82891S Other fracture of right lower leg, sequela: Secondary | ICD-10-CM

## 2013-09-16 DIAGNOSIS — S060X1A Concussion with loss of consciousness of 30 minutes or less, initial encounter: Secondary | ICD-10-CM

## 2013-09-16 DIAGNOSIS — S069X0S Unspecified intracranial injury without loss of consciousness, sequela: Secondary | ICD-10-CM

## 2013-09-16 DIAGNOSIS — IMO0002 Reserved for concepts with insufficient information to code with codable children: Secondary | ICD-10-CM

## 2013-09-16 DIAGNOSIS — S069X9A Unspecified intracranial injury with loss of consciousness of unspecified duration, initial encounter: Secondary | ICD-10-CM | POA: Insufficient documentation

## 2013-09-16 DIAGNOSIS — X58XXXA Exposure to other specified factors, initial encounter: Secondary | ICD-10-CM | POA: Insufficient documentation

## 2013-09-16 DIAGNOSIS — S8290XS Unspecified fracture of unspecified lower leg, sequela: Secondary | ICD-10-CM

## 2013-09-16 DIAGNOSIS — S12100S Unspecified displaced fracture of second cervical vertebra, sequela: Secondary | ICD-10-CM

## 2013-09-16 DIAGNOSIS — S069X9S Unspecified intracranial injury with loss of consciousness of unspecified duration, sequela: Secondary | ICD-10-CM

## 2013-09-16 NOTE — Patient Instructions (Signed)
PLEASE USE A DAILY MEMORY BOOK TO RECORD DAILY ACTIVITIES, APPOINTMENTS, "TO-DO'S", ETC  YOU NEED 8 HOURS OF SLEEP PER NIGHT (AT LEAST!)---TAKE A SHORT NAP, LESS THAN HOUR, DURING THE DAY IF NEEDED.  CALL ME WITH ANY PROBLEMS OR QUESTIONS (#[phone]).    HAPPY HOLIDAYS!!!!!
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
## 2013-09-18 ENCOUNTER — Telehealth: Payer: Self-pay

## 2013-09-18 DIAGNOSIS — S82892D Other fracture of left lower leg, subsequent encounter for closed fracture with routine healing: Secondary | ICD-10-CM

## 2013-09-18 DIAGNOSIS — S12100D Unspecified displaced fracture of second cervical vertebra, subsequent encounter for fracture with routine healing: Secondary | ICD-10-CM

## 2013-09-18 DIAGNOSIS — S82892S Other fracture of left lower leg, sequela: Secondary | ICD-10-CM

## 2013-09-18 DIAGNOSIS — S82891S Other fracture of right lower leg, sequela: Secondary | ICD-10-CM

## 2013-09-18 DIAGNOSIS — G5740 Lesion of medial popliteal nerve, unspecified lower limb: Secondary | ICD-10-CM

## 2013-09-18 DIAGNOSIS — S82891D Other fracture of right lower leg, subsequent encounter for closed fracture with routine healing: Secondary | ICD-10-CM

## 2013-09-18 MED ORDER — OXYCODONE HCL 10 MG PO TABS: 5.0000 mg | ORAL_TABLET | Freq: Four times a day (QID) | ORAL | Status: DC | PRN

## 2013-09-18 NOTE — Telephone Encounter (Signed)
Patient aware script will be ready Tuesday afternoon.

## 2013-09-18 NOTE — Telephone Encounter (Signed)
We can fill on Tuesday. Please remind me. i asked her last week if she needed a refill

## 2013-09-18 NOTE — Telephone Encounter (Signed)
Patient requesting oxycodone refill.  Please advise.  Patient aware Dr Riley Kill is out of the office until Monday.

## 2013-10-30 ENCOUNTER — Telehealth: Payer: Self-pay

## 2013-10-30 DIAGNOSIS — G5740 Lesion of medial popliteal nerve, unspecified lower limb: Secondary | ICD-10-CM

## 2013-10-30 MED ORDER — OXYCODONE HCL 10 MG PO TABS: 5.0000 mg | ORAL_TABLET | Freq: Four times a day (QID) | ORAL | Status: DC | PRN

## 2013-10-30 NOTE — Telephone Encounter (Signed)
Patient informed her oxycodone rx is ready for pick up.

## 2013-10-30 NOTE — Telephone Encounter (Signed)
Patient is requesting a refill of Oxycodone. RX printed for Dr. Naaman Plummer to sign will contact patient when ready for pickup. Patient was also inquiring about her speech therapy, she has not heard anything from therapy.

## 2013-11-13 ENCOUNTER — Ambulatory Visit: Payer: Medicaid Other | Attending: Physical Medicine & Rehabilitation

## 2013-11-13 DIAGNOSIS — R41841 Cognitive communication deficit: Secondary | ICD-10-CM | POA: Insufficient documentation

## 2013-11-13 DIAGNOSIS — IMO0001 Reserved for inherently not codable concepts without codable children: Secondary | ICD-10-CM | POA: Insufficient documentation

## 2013-11-17 ENCOUNTER — Encounter: Payer: Self-pay | Admitting: Physical Medicine & Rehabilitation

## 2013-11-17 ENCOUNTER — Encounter: Payer: Medicaid Other | Attending: Physical Medicine & Rehabilitation | Admitting: Physical Medicine & Rehabilitation

## 2013-11-17 VITALS — BP 119/72 | HR 98 | Resp 14 | Ht 65.0 in | Wt 197.0 lb

## 2013-11-17 DIAGNOSIS — G5780 Other specified mononeuropathies of unspecified lower limb: Secondary | ICD-10-CM | POA: Insufficient documentation

## 2013-11-17 DIAGNOSIS — S12100A Unspecified displaced fracture of second cervical vertebra, initial encounter for closed fracture: Secondary | ICD-10-CM | POA: Insufficient documentation

## 2013-11-17 DIAGNOSIS — Z5181 Encounter for therapeutic drug level monitoring: Secondary | ICD-10-CM | POA: Insufficient documentation

## 2013-11-17 DIAGNOSIS — S069X9A Unspecified intracranial injury with loss of consciousness of unspecified duration, initial encounter: Secondary | ICD-10-CM | POA: Insufficient documentation

## 2013-11-17 DIAGNOSIS — S82892A Other fracture of left lower leg, initial encounter for closed fracture: Secondary | ICD-10-CM

## 2013-11-17 DIAGNOSIS — X58XXXA Exposure to other specified factors, initial encounter: Secondary | ICD-10-CM | POA: Insufficient documentation

## 2013-11-17 DIAGNOSIS — S060X9A Concussion with loss of consciousness of unspecified duration, initial encounter: Secondary | ICD-10-CM

## 2013-11-17 DIAGNOSIS — S82891A Other fracture of right lower leg, initial encounter for closed fracture: Secondary | ICD-10-CM

## 2013-11-17 DIAGNOSIS — Z79899 Other long term (current) drug therapy: Secondary | ICD-10-CM | POA: Insufficient documentation

## 2013-11-17 DIAGNOSIS — G43909 Migraine, unspecified, not intractable, without status migrainosus: Secondary | ICD-10-CM | POA: Insufficient documentation

## 2013-11-17 DIAGNOSIS — G5740 Lesion of medial popliteal nerve, unspecified lower limb: Secondary | ICD-10-CM

## 2013-11-17 DIAGNOSIS — S82899B Other fracture of unspecified lower leg, initial encounter for open fracture type I or II: Secondary | ICD-10-CM | POA: Insufficient documentation

## 2013-11-17 DIAGNOSIS — S060XAA Concussion with loss of consciousness status unknown, initial encounter: Secondary | ICD-10-CM | POA: Insufficient documentation

## 2013-11-17 DIAGNOSIS — S069XAA Unspecified intracranial injury with loss of consciousness status unknown, initial encounter: Secondary | ICD-10-CM | POA: Insufficient documentation

## 2013-11-17 MED ORDER — CLONAZEPAM 0.5 MG PO TABS: 0.2500 mg | ORAL_TABLET | Freq: Two times a day (BID) | ORAL | Status: DC | PRN

## 2013-11-17 MED ORDER — METHYLPHENIDATE HCL 5 MG PO TABS: 5.0000 mg | ORAL_TABLET | Freq: Two times a day (BID) | ORAL | Status: DC

## 2013-11-17 MED ORDER — OXYCODONE HCL 10 MG PO TABS: 5.0000 mg | ORAL_TABLET | Freq: Four times a day (QID) | ORAL | Status: DC | PRN

## 2013-11-17 NOTE — Addendum Note (Signed)
Addended by: Caro Hight on: 11/17/2013 01:41 PM   Modules accepted: Orders

## 2013-11-17 NOTE — Progress Notes (Signed)
Subjective:    Patient ID: Monique [name], female    DOB: Mar 25, 1986, 28 y.o.   MRN: [id number]  HPI  Monique [name] is back regarding her chronic pain. She continues to struggle with attention and focus as well as STM at home. She is anxious and emotional at times. She often feels overwhelmed. She has gone to a visit or two with SLP but will be limited due to MCD. Mother is concerned about how she's handling things and the fact that "my daughter is different." Mother was upset that my CMA confronted her about CSA and lack of pill bottles.    Pain Inventory Average Pain 6 Pain Right Now 6 My pain is sharp, tingling and aching  In the last 24 hours, has pain interfered with the following? General activity 7 Relation with others 7 Enjoyment of life 8 What TIME of day is your pain at its worst? evening, night Sleep (in general) Fair  Pain is worse with: walking Pain improves with: rest and medication Relief from Meds: 7  Mobility walk without assistance ability to climb steps?  yes do you drive?  no transfers alone  Function disabled: date disabled na I need assistance with the following:  household duties and shopping  Neuro/Psych weakness dizziness confusion  Prior Studies Any changes since last visit?  no  Physicians involved in your care Any changes since last visit?  no   Family History  Problem Relation Age of Onset  . Cancer Paternal Grandmother     Ovarian   History   Social History  . Marital Status: Single    Spouse Name: N/A    Number of Children: N/A  . Years of Education: N/A   Social History Main Topics  . Smoking status: Passive Smoke Exposure - Never Smoker  . Smokeless tobacco: Never Used  . Alcohol Use: Yes     Comment: occasional  . Drug Use: No  . Sexual Activity: Yes   Other Topics Concern  . None   Social History Narrative  . None   Past Surgical History  Procedure Laterality Date  . Orif ankle fracture Left 12/14/2012      Procedure: OPEN REDUCTION INTERNAL FIXATION (ORIF) ANKLE FRACTURE;  Surgeon: [name], MD;  Location: [place];  Service: Orthopedics;  Laterality: Left;  . I&d extremity Left 12/14/2012    Procedure: IRRIGATION AND DEBRIDEMENT EXTREMITY;  Surgeon: [name], MD;  Location: [place];  Service: Orthopedics;  Laterality: Left;  . External fixation leg Bilateral 12/14/2012    Procedure: EXTERNAL FIXATION LEG;  Surgeon: [name], MD;  Location: [place];  Service: Orthopedics;  Laterality: Bilateral;  . Appendectomy    . Ceasarean section  2007, 2010  . Orif tibia fracture Bilateral 12/23/2012    Procedure: OPEN REDUCTION INTERNAL FIXATION (ORIF) BILATERAL DISTAL TIBIA FRACTURE;  Surgeon: [name], MD;  Location: [place];  Service: Orthopedics;  Laterality: Bilateral;  . External fixation removal Bilateral 12/23/2012    Procedure: REMOVAL EXTERNAL FIXATION LEG;  Surgeon: [name], MD;  Location: [place];  Service: Orthopedics;  Laterality: Bilateral;   Past Medical History  Diagnosis Date  . Depression   . Anxiety   . Heart murmur   . Anemia    BP 119/72  Pulse 98  Resp 14  Ht 5\' 5"  (1.651 m)  Wt 197 lb (89.359 kg)  BMI 32.78 kg/m2  SpO2 99%  Opioid Risk Score: 1 Fall Risk Score: Low Fall  Risk (0-5 points) (pt educated on fall risk, brochure given to pt.)    Review of Systems  Neurological: Positive for dizziness and weakness.  Psychiatric/Behavioral: Positive for confusion.  All other systems reviewed and are negative.       Objective:   Physical Exam  General: Alert and oriented x 3, No apparent distress  HEENT: Head is normocephalic, atraumatic, PERRLA, EOMI, sclera anicteric, oral mucosa pink and moist, dentition intact, ext ear canals clear,  Neck: Supple without JVD or lymphadenopathy  Heart: Reg rate and rhythm. No murmurs rubs or gallops  Chest: CTA bilaterally without wheezes, rales, or rhonchi; no distress  Abdomen: Soft, non-tender,  non-distended, bowel sounds positive.  Extremities: No clubbing, cyanosis, notable edema as below. Pulses are 2+  Skin: Clean and intact without signs of breakdown  Neuro: Pt is cognitively appropriate with normal insight, and awareness. Cranial nerves 2-12 are intact. Sensory exam is normal except for the bases of the feet which are hypersensitive to touch and lack FT discrimination. Reflexes are 2+ in all 4's. UE motor 5/5. LE: 4/5 HF, KE, 2-3 at. MORE anxious, reasonable Attention and lost focus. Anxiety sometimes worsens this. Doesn't handle complicated verbal information well. Needs to be addressed with short simple statements and plans. Mother didn't help much as she tended to be quite irritable during the visit.  Musculoskeletal: Callus noted at ankles, with associated swelling as well. Ankles were somewhat tender to palpation. ROM remains limited. She walks with little ADF and has to place the right/left leg far out in front with the knee fully extended to allow weight bearing through the right/left foot. Antalgia is noted on either leg. Both heel cords are tight and she is -10 passive DF on right and NEUTRAL on the left.  Psych: Pt's affect is   anxious today. Pt is cooperative    Assessment & Plan:   1. DVT Prophylaxis/Anticoagulation: Pharmaceutical: Coumadin-Continue until she is routinely walking around her home   2. Pain Management/ tibial nerve injuries: Continue with oxycodone #60.Marland Kitchen Reminded her of her CSA agreement.   3. Bilateral pilon fractures  -prn Meloxicam, tylenol, ice/heat  -adaptive strategies including sitting/rest breaks, etcd  -reviewed heel cord stretching. May use tizanidine for cramps  4. C2 fracture: healed   5. TBI: having more issues due to the fact that she's has much more on her plate.  -I discussed the fact that she needs to simplify her regimen and develop some consistency.   -limited SLP due to MCD/insurance. -begin stimulant trial ritalin 5mg  bid -will  also add low dose klonopin for anxiety attacks.  -asked that mother and family help with mgt of symptoms and with family members to better deal with deficits from her injury. -might benefit from neuropsych input also  6. Follow up in 1 months time. 25 minutes of face to face patient care time were spent during this visit. All questions were encouraged and answered.

## 2013-11-17 NOTE — Patient Instructions (Signed)
PLEASE WORK ON ESTABLISHING A SIMPLE AND CONSISTENT SCHEDULE. KEEP A MEMORY BOOK/CALENDAR. TRY TO WORK ON ONE THING AT A TIME.    I WANT TO GET 8 TO 10 HOURS OF SLEEP PER NIGHT.    MAKE SURE THAT YOU BRING IN YOUR PILL BOTTLE WHEN YOU COME TO THE OFFICE.

## 2013-11-25 ENCOUNTER — Ambulatory Visit: Payer: Medicaid Other

## 2013-12-02 ENCOUNTER — Ambulatory Visit: Payer: Medicaid Other

## 2013-12-03 ENCOUNTER — Ambulatory Visit: Payer: Medicaid Other | Admitting: Speech Pathology

## 2013-12-09 ENCOUNTER — Ambulatory Visit: Payer: Medicaid Other | Attending: Physical Medicine & Rehabilitation

## 2013-12-09 DIAGNOSIS — R41841 Cognitive communication deficit: Secondary | ICD-10-CM | POA: Insufficient documentation

## 2013-12-09 DIAGNOSIS — IMO0001 Reserved for inherently not codable concepts without codable children: Secondary | ICD-10-CM | POA: Insufficient documentation

## 2013-12-15 ENCOUNTER — Other Ambulatory Visit: Payer: Self-pay | Admitting: *Deleted

## 2013-12-15 DIAGNOSIS — S069X9A Unspecified intracranial injury with loss of consciousness of unspecified duration, initial encounter: Secondary | ICD-10-CM

## 2013-12-15 DIAGNOSIS — S060XAA Concussion with loss of consciousness status unknown, initial encounter: Secondary | ICD-10-CM

## 2013-12-15 DIAGNOSIS — S060X9A Concussion with loss of consciousness of unspecified duration, initial encounter: Secondary | ICD-10-CM

## 2013-12-15 DIAGNOSIS — G5740 Lesion of medial popliteal nerve, unspecified lower limb: Secondary | ICD-10-CM

## 2013-12-15 DIAGNOSIS — S069XAA Unspecified intracranial injury with loss of consciousness status unknown, initial encounter: Secondary | ICD-10-CM

## 2013-12-15 MED ORDER — METHYLPHENIDATE HCL 5 MG PO TABS: 5.0000 mg | ORAL_TABLET | Freq: Two times a day (BID) | ORAL | Status: DC

## 2013-12-15 MED ORDER — OXYCODONE HCL 10 MG PO TABS: 5.0000 mg | ORAL_TABLET | Freq: Four times a day (QID) | ORAL | Status: DC | PRN

## 2013-12-15 NOTE — Telephone Encounter (Signed)
RX printed for MD to sign for PA visit 12/17/13 

## 2013-12-17 ENCOUNTER — Encounter
Payer: Medicaid Other | Attending: Physical Medicine and Rehabilitation | Admitting: Physical Medicine and Rehabilitation

## 2013-12-17 ENCOUNTER — Encounter: Payer: Self-pay | Admitting: Physical Medicine and Rehabilitation

## 2013-12-17 VITALS — BP 115/59 | HR 86 | Resp 14 | Ht 65.0 in | Wt 203.0 lb

## 2013-12-17 DIAGNOSIS — G8929 Other chronic pain: Secondary | ICD-10-CM | POA: Insufficient documentation

## 2013-12-17 DIAGNOSIS — S060XAA Concussion with loss of consciousness status unknown, initial encounter: Secondary | ICD-10-CM

## 2013-12-17 DIAGNOSIS — M25579 Pain in unspecified ankle and joints of unspecified foot: Secondary | ICD-10-CM | POA: Insufficient documentation

## 2013-12-17 DIAGNOSIS — Z79899 Other long term (current) drug therapy: Secondary | ICD-10-CM

## 2013-12-17 DIAGNOSIS — Z87820 Personal history of traumatic brain injury: Secondary | ICD-10-CM | POA: Insufficient documentation

## 2013-12-17 DIAGNOSIS — S82891A Other fracture of right lower leg, initial encounter for closed fracture: Secondary | ICD-10-CM

## 2013-12-17 DIAGNOSIS — S82899B Other fracture of unspecified lower leg, initial encounter for open fracture type I or II: Secondary | ICD-10-CM

## 2013-12-17 DIAGNOSIS — G5780 Other specified mononeuropathies of unspecified lower limb: Secondary | ICD-10-CM

## 2013-12-17 DIAGNOSIS — S060X9A Concussion with loss of consciousness of unspecified duration, initial encounter: Secondary | ICD-10-CM

## 2013-12-17 DIAGNOSIS — S069X9A Unspecified intracranial injury with loss of consciousness of unspecified duration, initial encounter: Secondary | ICD-10-CM

## 2013-12-17 DIAGNOSIS — S82892A Other fracture of left lower leg, initial encounter for closed fracture: Secondary | ICD-10-CM

## 2013-12-17 DIAGNOSIS — S069XAA Unspecified intracranial injury with loss of consciousness status unknown, initial encounter: Secondary | ICD-10-CM

## 2013-12-17 DIAGNOSIS — G5740 Lesion of medial popliteal nerve, unspecified lower limb: Secondary | ICD-10-CM

## 2013-12-17 MED ORDER — MELOXICAM 15 MG PO TABS: 15.0000 mg | ORAL_TABLET | Freq: Every day | ORAL | Status: DC

## 2013-12-17 MED ORDER — TIZANIDINE HCL 4 MG PO TABS: ORAL_TABLET | ORAL | Status: DC
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
## 2013-12-17 NOTE — Patient Instructions (Addendum)
1. Mobic-to help with inflammation in bone. Take  Daily to help your bone simmer down. 2. Ritalin--to help you focus, plan and think  About thing to get done and get through the day.  3. Klonopin---to help you relax/bring you down. "Time out"  Start with 1/2 a pill twice a day as needed.  4. Zanaflex--- help with foot cramps. 5. Keep feet elevated when in chair. Try a wedge in bed. Also support stocking to help with swelling.  6.  SCHEDULE/TAKE TIME FOR YOURSELF. MEDITATE.  7. Speech/Hearing Language services at [facility] for follow up----[phone] 8.  Aspercreme/ Icy hot--any Sportscreme to your feet. 9.  Take notes on your phone or a book to carry around with you.

## 2013-12-31 ENCOUNTER — Telehealth: Payer: Self-pay

## 2013-12-31 NOTE — Telephone Encounter (Signed)
Patient would like referral for bath chair and compression hoses that was order at her last OV faxed to [facility] @ [phone].

## 2014-01-18 ENCOUNTER — Telehealth: Payer: Self-pay

## 2014-01-18 ENCOUNTER — Other Ambulatory Visit: Payer: Self-pay

## 2014-01-18 DIAGNOSIS — G5740 Lesion of medial popliteal nerve, unspecified lower limb: Secondary | ICD-10-CM

## 2014-01-18 MED ORDER — OXYCODONE HCL 10 MG PO TABS: 5.0000 mg | ORAL_TABLET | Freq: Four times a day (QID) | ORAL | Status: DC | PRN

## 2014-01-18 NOTE — Telephone Encounter (Signed)
Patient is requesting a refill on Oxycodone. Last fill date 3/10, and her next appt is 4/30. RX printed for Dr. Naaman Plummer to sign. Will contact patient when ready for pickup.

## 2014-01-18 NOTE — Telephone Encounter (Signed)
Patient aware oxycodone rx is ready for pick up. 

## 2014-01-26 IMAGING — RF DG C-ARM GT 120 MIN
1 series · 3 of 3 positions shown · non-contrast
Comparison: 12/15/2011 CT.

CLINICAL DATA: Right ankle fracture.

DG C-ARM GT 120 MIN,RIGHT ANKLE - COMPLETE 3+ VIEW

[Series 1: run · 3 of 3 slices shown]
[im 1/3]
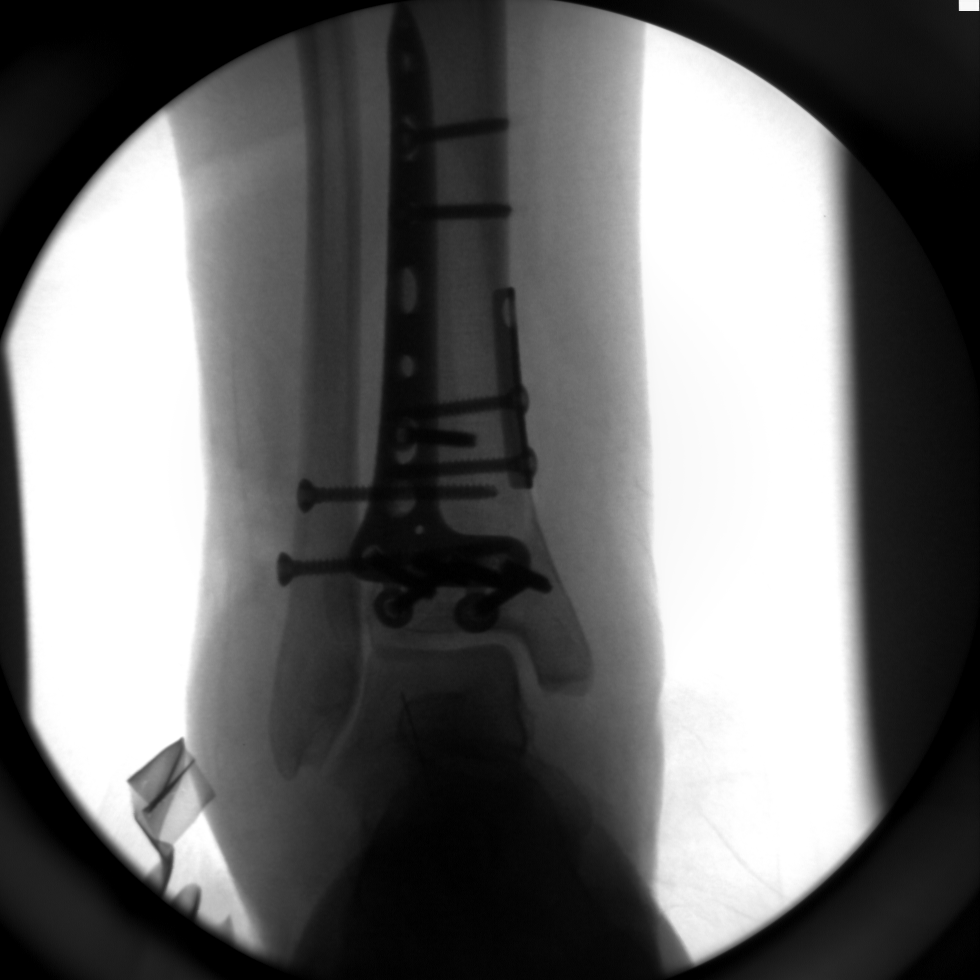
[im 2/3]
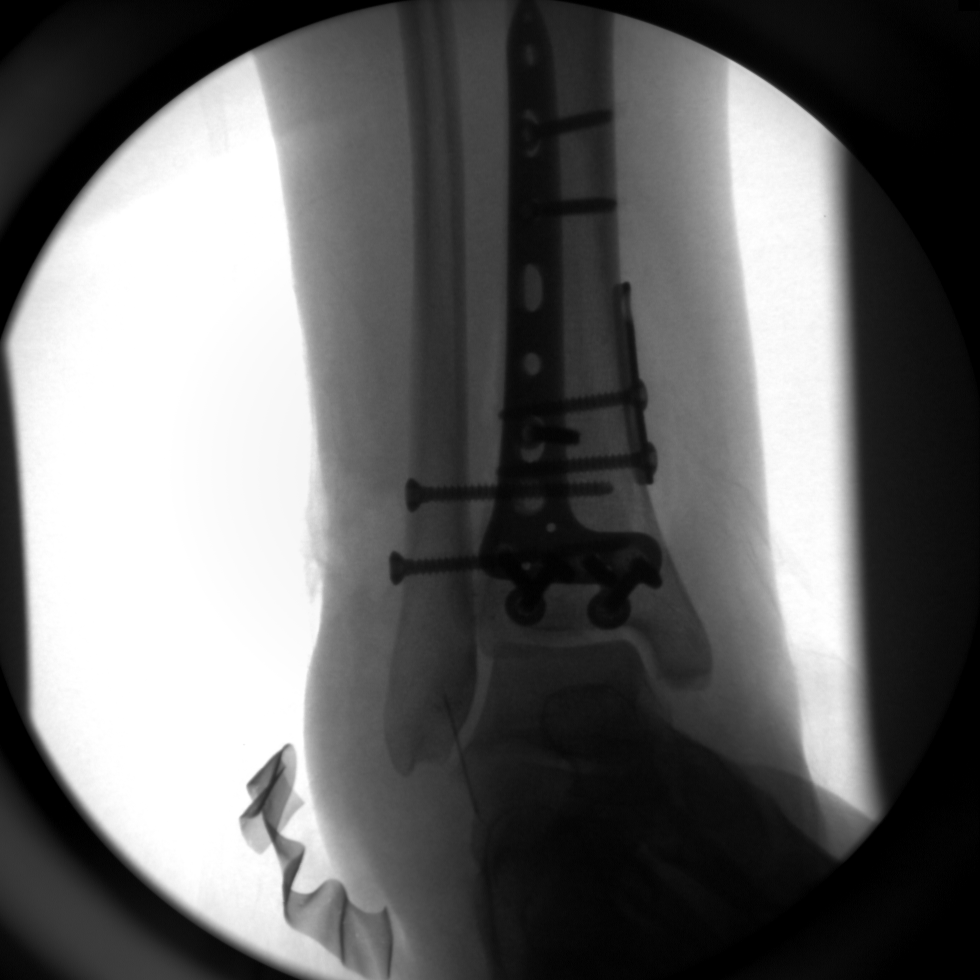
[im 3/3]
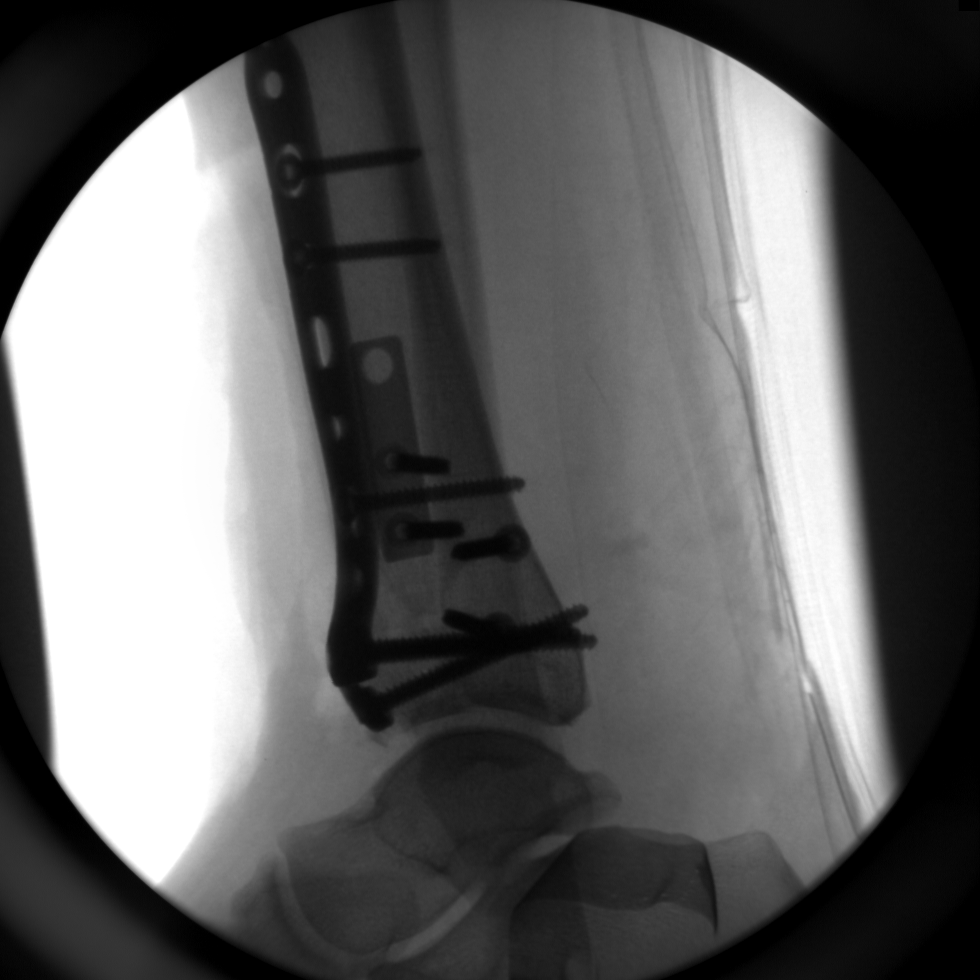

[3 of 3 positions shown; findings below may reference images not displayed]

FINDINGS: Three intraoperative C-arm views of the right ankle
submitted for review after surgery.

Comminuted right tibial fracture reduced with side plate and screws
laterally and medially as well as separate screws between the
fibula and tibia.  Although there is minimal incongruities of the
distal tibial articular surface, this represents significant
improvement from the preoperative exam.  Ankle mortise
reconstructed.

Surgical sponge/metallic inch in place.
IMPRESSION: Open reduction and internal fixation of comminuted right tibia
fracture as noted above.

## 2014-01-26 IMAGING — CR DG ANKLE PORT 2V*L*
2 series · 2 of 2 positions shown · non-contrast
Comparison: 12/23/2012.  12/14/2012.

CLINICAL DATA: Postoperative radiographs.  ORIF.

PORTABLE LEFT ANKLE - 2 VIEW

[AP]
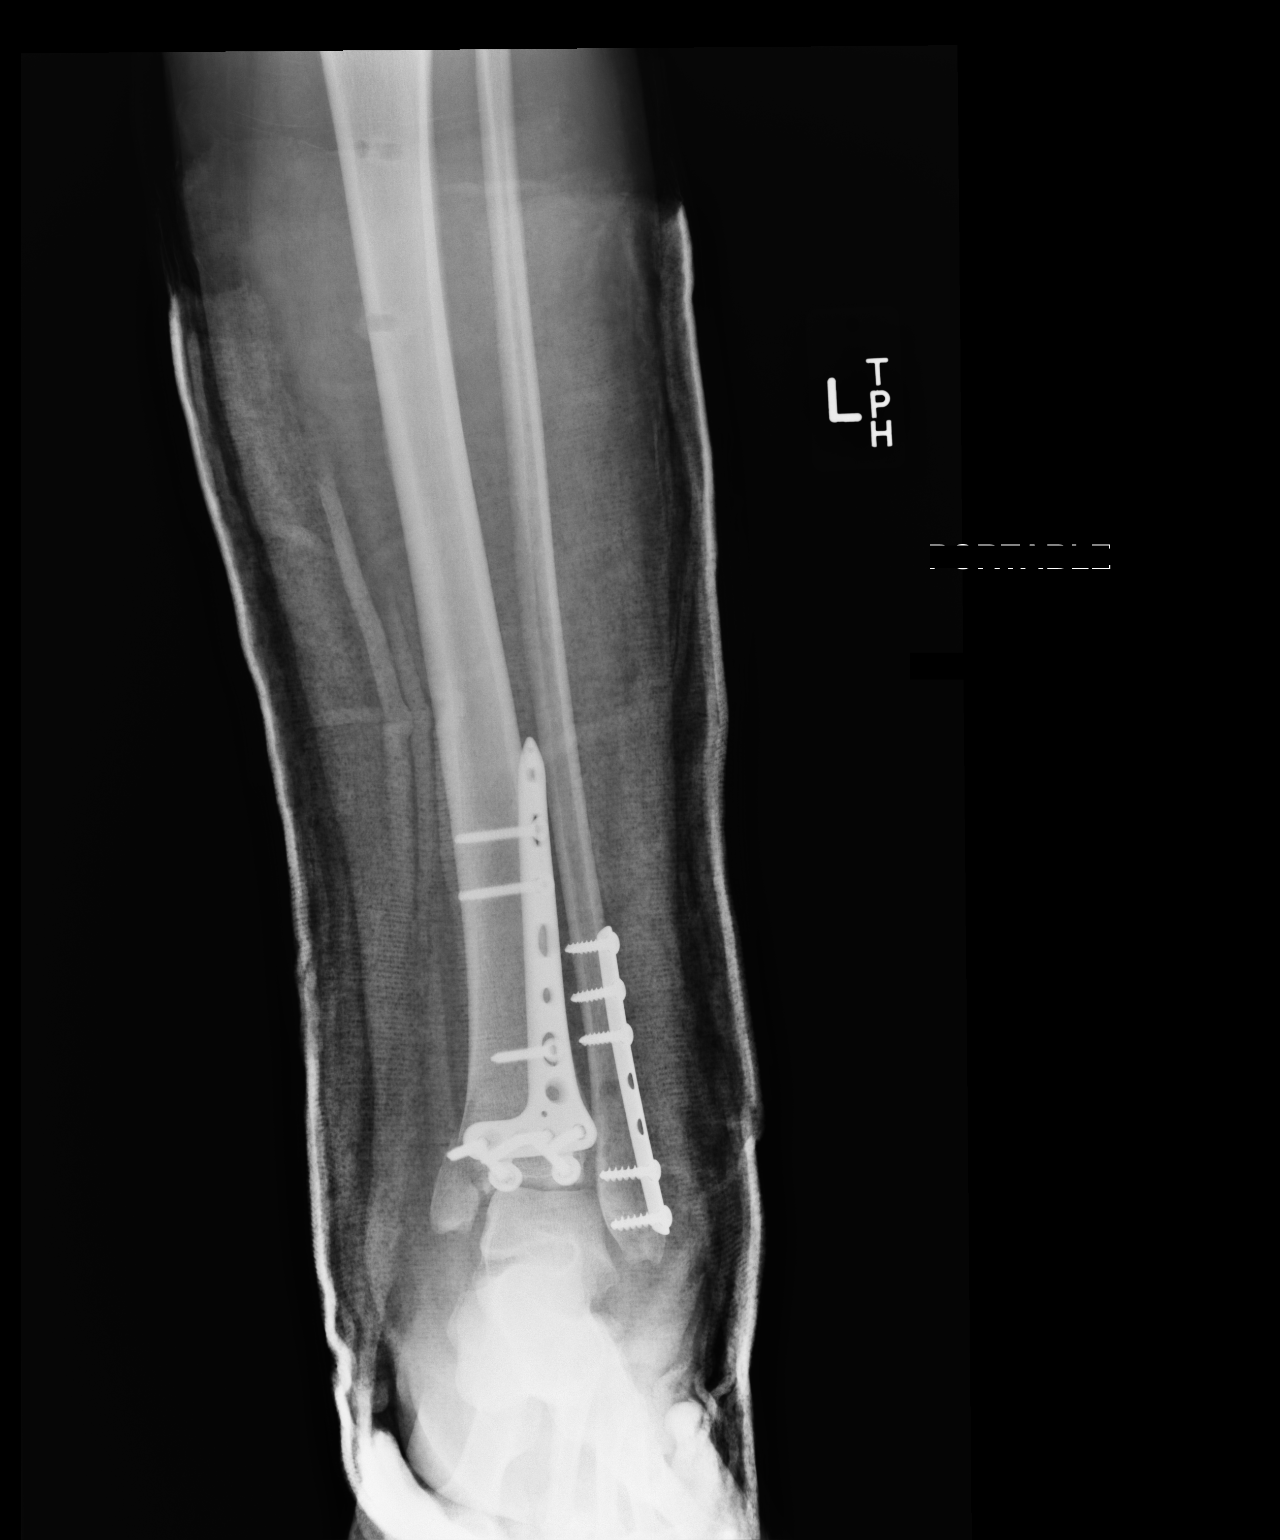

[lateral]
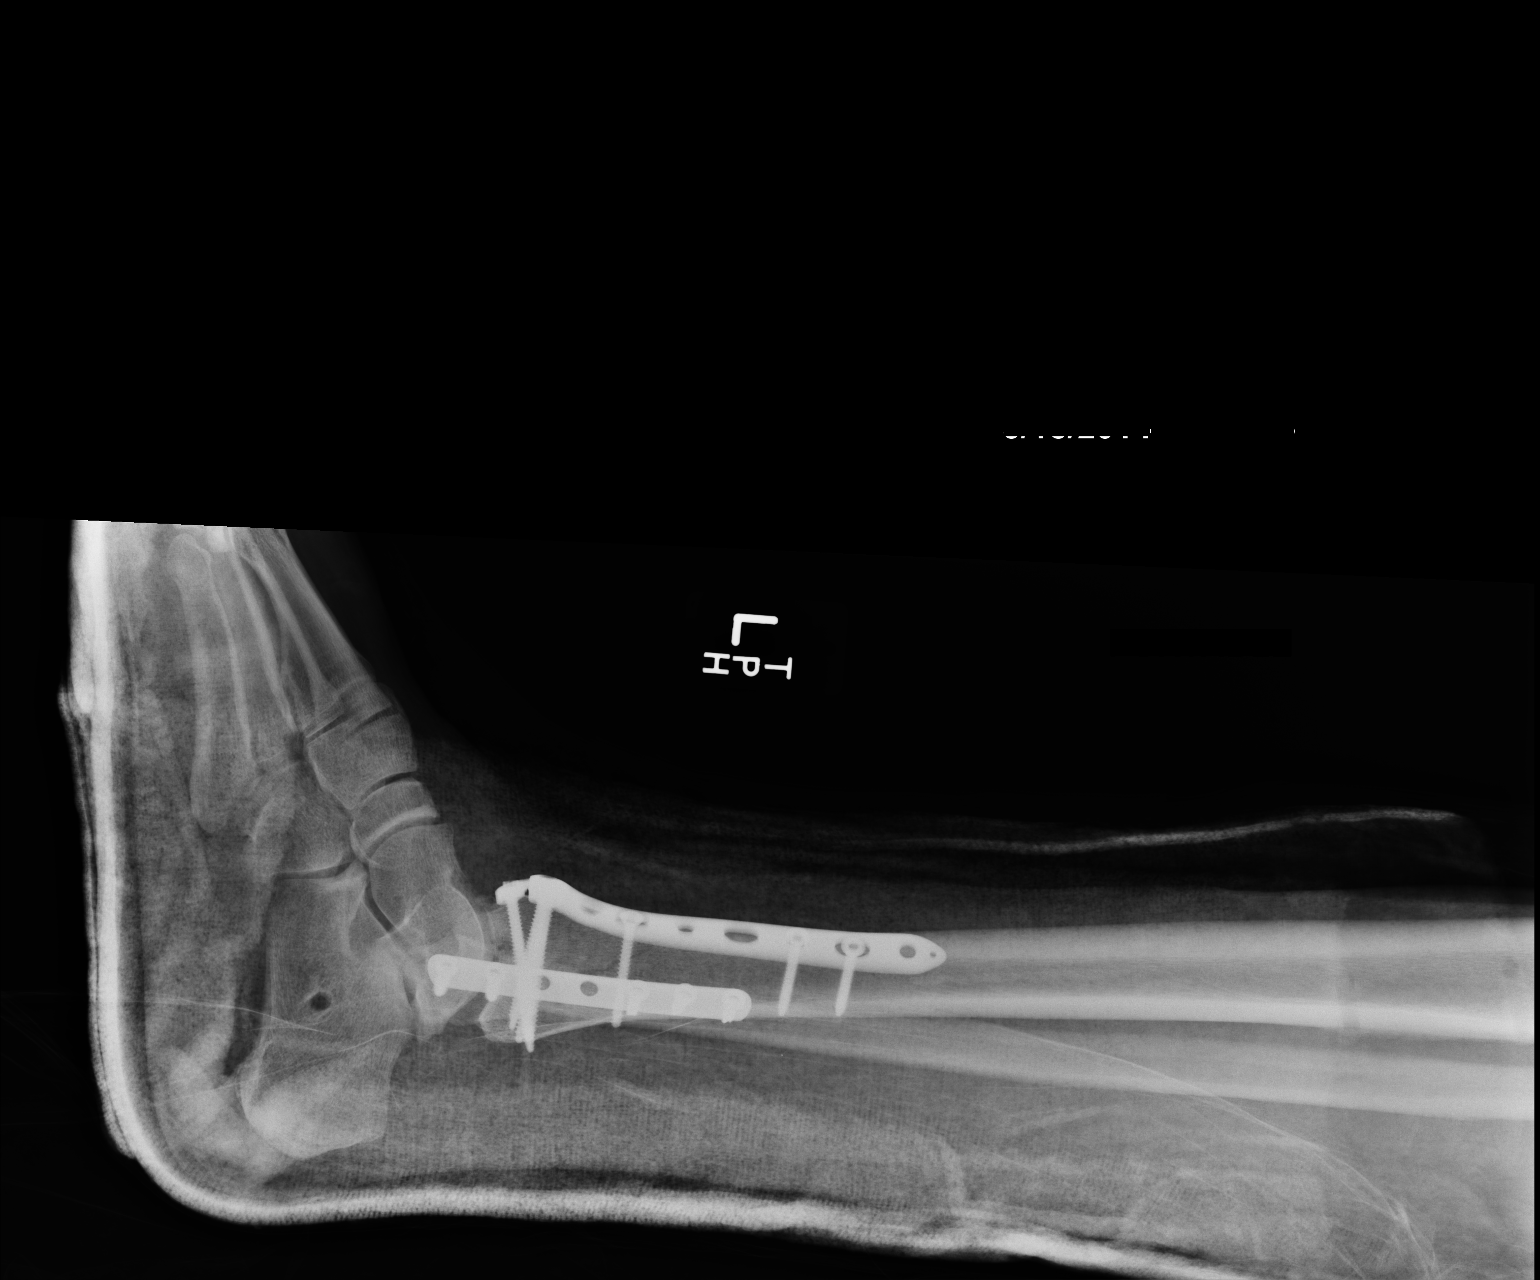

[2 of 2 positions shown; findings below may reference images not displayed]

FINDINGS: Anterior buttress plate and screw fixation of the distal
tibia and tibial plafond is present.  External fixator screw tracts
are again noted.  The alignment is near anatomic.  Mild persistent
displacement of the medial malleolus.  Calcaneal external fixator
pin tract again noted.
IMPRESSION: Ankle ORIF as described above.

## 2014-01-26 IMAGING — CR DG ANKLE PORT 2V*R*
2 series · 2 of 2 positions shown · non-contrast
Comparison: Intraoperative radiographs 12/23/2012.

CLINICAL DATA: Postop.

PORTABLE RIGHT ANKLE - 2 VIEW

[AP]
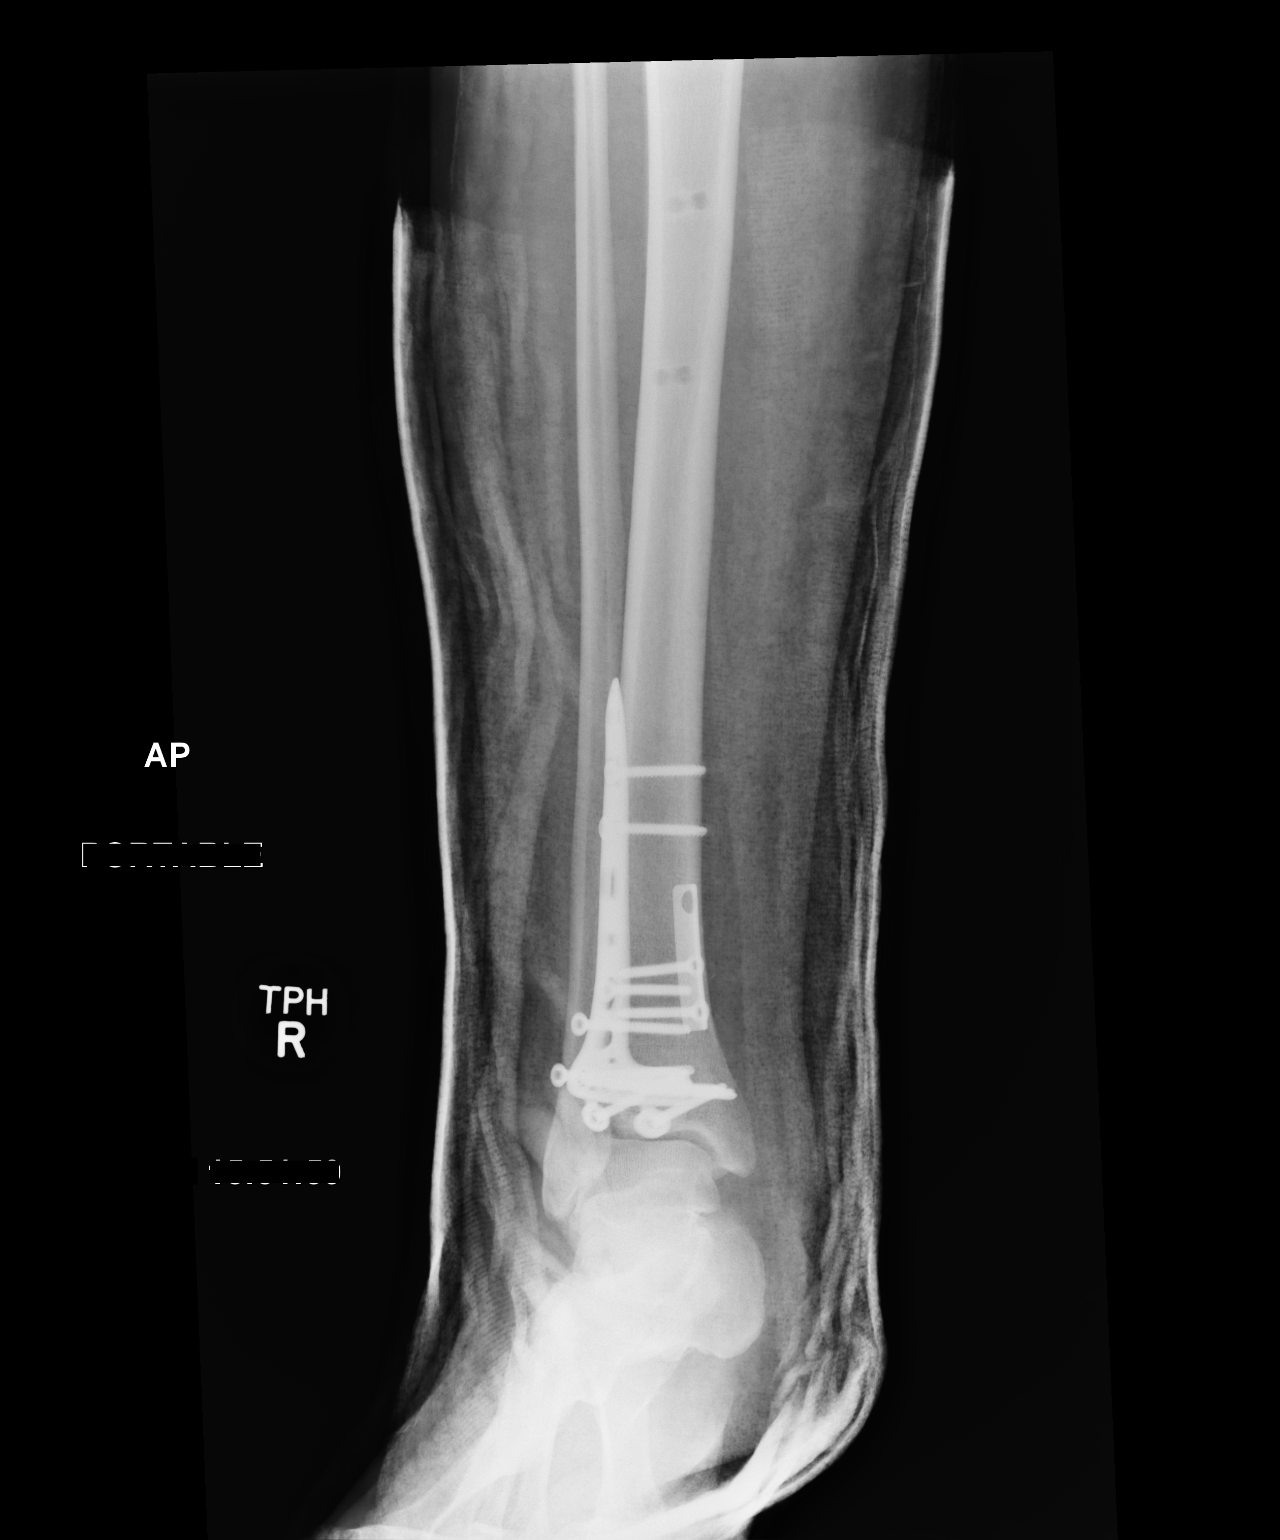

[lateral]
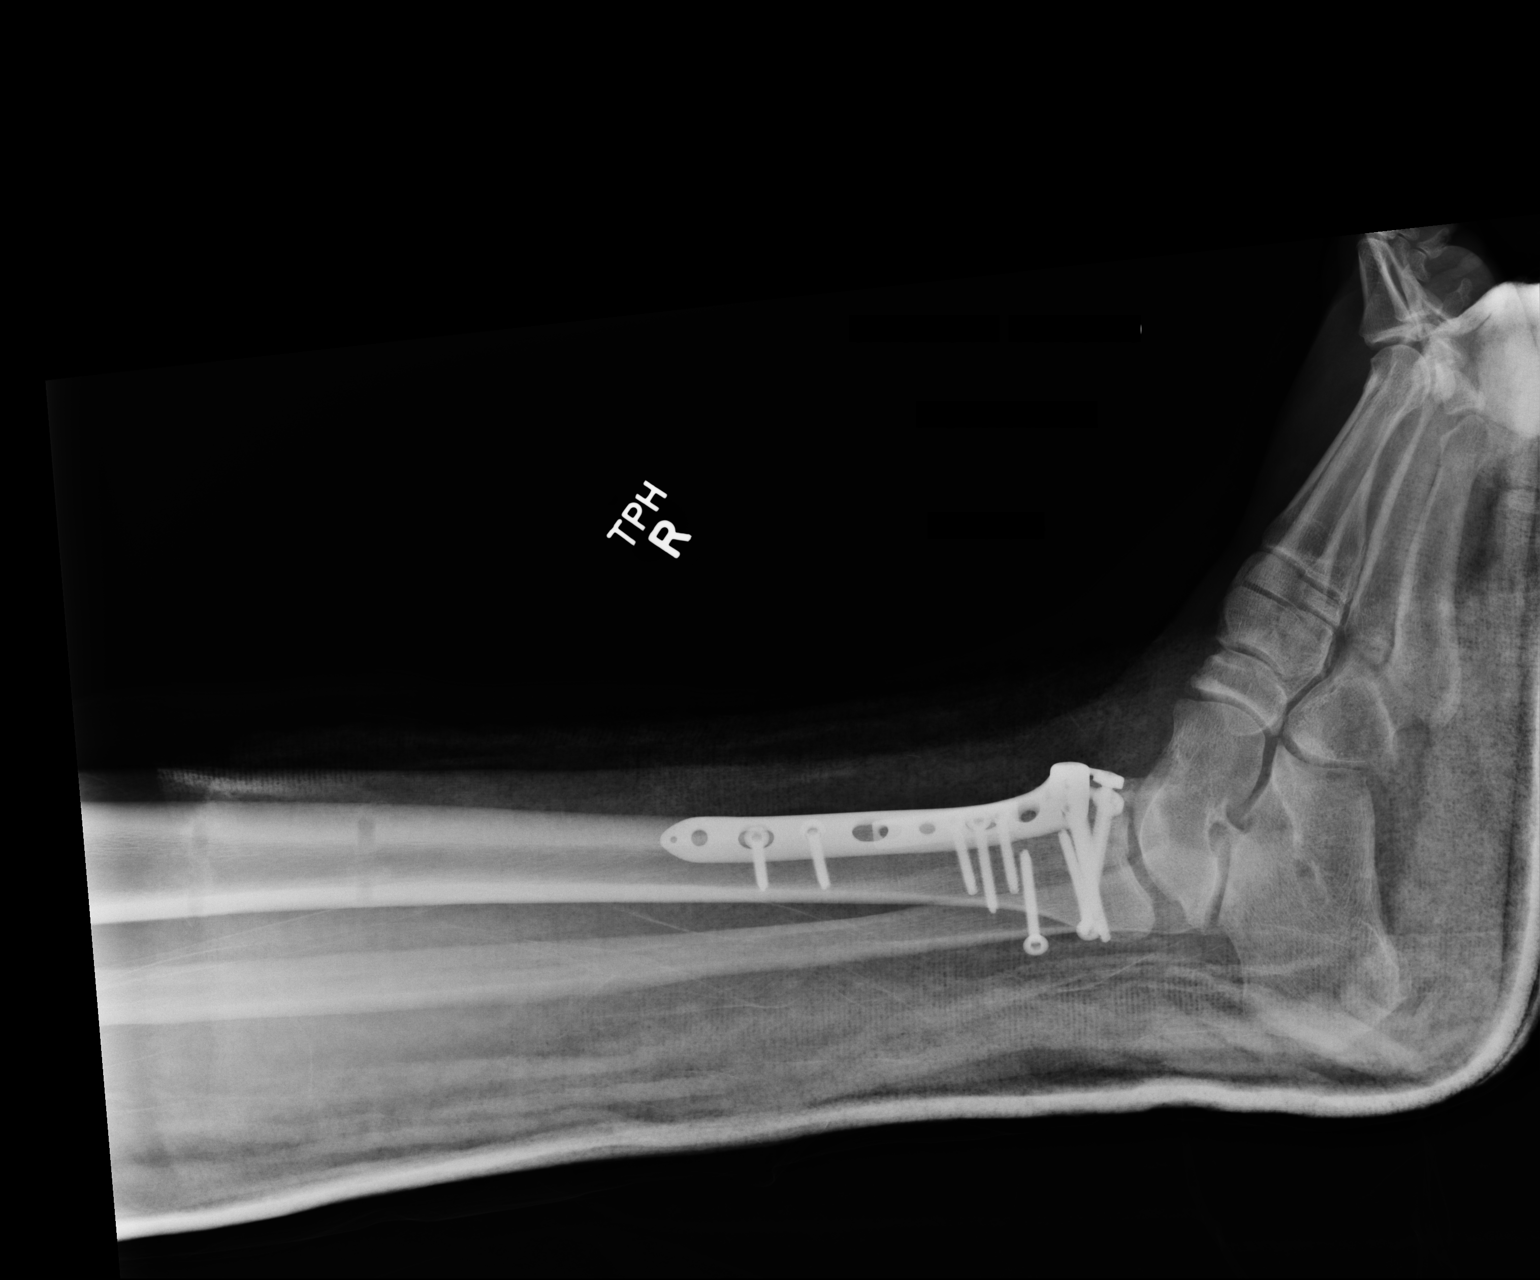

[2 of 2 positions shown; findings below may reference images not displayed]

FINDINGS: Two views are submitted and a plaster cast.  Antral
lateral plate and screw fixation is present over the distal tibia.
There are two screws extending across the distal fibula into the
tibia.  Alignment is anatomic.  The ankle joint is located.
IMPRESSION: 1.  Status post ORIF of a comminuted distal tibia fracture.
2.  No radiographic evidence for complication.
3.  The ankle joint is located.

## 2014-02-04 ENCOUNTER — Encounter: Payer: Medicaid Other | Attending: Physical Medicine & Rehabilitation | Admitting: Registered Nurse

## 2014-02-04 ENCOUNTER — Encounter: Payer: Self-pay | Admitting: Registered Nurse

## 2014-02-04 VITALS — BP 130/89 | HR 88 | Resp 14 | Ht 65.0 in | Wt 201.0 lb

## 2014-02-04 DIAGNOSIS — S069XAA Unspecified intracranial injury with loss of consciousness status unknown, initial encounter: Secondary | ICD-10-CM

## 2014-02-04 DIAGNOSIS — S82892A Other fracture of left lower leg, initial encounter for closed fracture: Secondary | ICD-10-CM

## 2014-02-04 DIAGNOSIS — S82891A Other fracture of right lower leg, initial encounter for closed fracture: Secondary | ICD-10-CM

## 2014-02-04 DIAGNOSIS — S060X9A Concussion with loss of consciousness of unspecified duration, initial encounter: Secondary | ICD-10-CM

## 2014-02-04 DIAGNOSIS — G5780 Other specified mononeuropathies of unspecified lower limb: Secondary | ICD-10-CM | POA: Insufficient documentation

## 2014-02-04 DIAGNOSIS — S069X9A Unspecified intracranial injury with loss of consciousness of unspecified duration, initial encounter: Secondary | ICD-10-CM

## 2014-02-04 DIAGNOSIS — Z79899 Other long term (current) drug therapy: Secondary | ICD-10-CM

## 2014-02-04 DIAGNOSIS — S82899B Other fracture of unspecified lower leg, initial encounter for open fracture type I or II: Secondary | ICD-10-CM

## 2014-02-04 DIAGNOSIS — G5740 Lesion of medial popliteal nerve, unspecified lower limb: Secondary | ICD-10-CM

## 2014-02-04 DIAGNOSIS — X58XXXA Exposure to other specified factors, initial encounter: Secondary | ICD-10-CM | POA: Insufficient documentation

## 2014-02-04 DIAGNOSIS — Z5181 Encounter for therapeutic drug level monitoring: Secondary | ICD-10-CM

## 2014-02-04 DIAGNOSIS — S060XAA Concussion with loss of consciousness status unknown, initial encounter: Secondary | ICD-10-CM

## 2014-02-04 MED ORDER — OXYCODONE HCL 10 MG PO TABS: 5.0000 mg | ORAL_TABLET | Freq: Four times a day (QID) | ORAL | Status: DC | PRN

## 2014-02-04 NOTE — Progress Notes (Signed)
Subjective:    Patient ID: Monique [name], female    DOB: 08-16-1986, 28 y.o.   MRN: [id number]  HPI Pain Inventory Average Pain 6 Pain Right Now 3 My pain is sharp, stabbing and aching  In the last 24 hours, has pain interfered with the following? General activity 7 Relation with others 7 Enjoyment of life 7 What TIME of day is your pain at its worst? morning,evening, night Sleep (in general) Fair  Pain is worse with: walking, bending and some activites Pain improves with: rest, pacing activities and medication Relief from Meds: 7  Mobility walk without assistance how many minutes can you walk? 15 ability to climb steps?  yes transfers alone Do you have any goals in this area?  yes  Function disabled: date disabled na Do you have any goals in this area?  no  Neuro/Psych weakness tingling trouble walking anxiety  Prior Studies Any changes since last visit?  no  Physicians involved in your care Any changes since last visit?  no   Family History  Problem Relation Age of Onset  . Cancer Paternal Grandmother     Ovarian   History   Social History  . Marital Status: Single    Spouse Name: N/A    Number of Children: N/A  . Years of Education: N/A   Social History Main Topics  . Smoking status: Passive Smoke Exposure - Never Smoker  . Smokeless tobacco: Never Used  . Alcohol Use: Yes     Comment: occasional  . Drug Use: No  . Sexual Activity: Yes   Other Topics Concern  . None   Social History Narrative  . None   Past Surgical History  Procedure Laterality Date  . Orif ankle fracture Left 12/14/2012    Procedure: OPEN REDUCTION INTERNAL FIXATION (ORIF) ANKLE FRACTURE;  Surgeon: [name], MD;  Location: [place];  Service: Orthopedics;  Laterality: Left;  . I&d extremity Left 12/14/2012    Procedure: IRRIGATION AND DEBRIDEMENT EXTREMITY;  Surgeon: [name], MD;  Location: [place];  Service: Orthopedics;  Laterality: Left;  . External  fixation leg Bilateral 12/14/2012    Procedure: EXTERNAL FIXATION LEG;  Surgeon: [name], MD;  Location: [place];  Service: Orthopedics;  Laterality: Bilateral;  . Appendectomy    . Ceasarean section  2007, 2010  . Orif tibia fracture Bilateral 12/23/2012    Procedure: OPEN REDUCTION INTERNAL FIXATION (ORIF) BILATERAL DISTAL TIBIA FRACTURE;  Surgeon: [name], MD;  Location: [place];  Service: Orthopedics;  Laterality: Bilateral;  . External fixation removal Bilateral 12/23/2012    Procedure: REMOVAL EXTERNAL FIXATION LEG;  Surgeon: [name], MD;  Location: [place];  Service: Orthopedics;  Laterality: Bilateral;   Past Medical History  Diagnosis Date  . Depression   . Anxiety   . Heart murmur   . Anemia    BP 130/89  Pulse 88  Resp 14  Ht 5\' 5"  (1.651 m)  Wt 201 lb (91.173 kg)  BMI 33.45 kg/m2  SpO2 98%  Opioid Risk Score:   Fall Risk Score: Low Fall Risk (0-5 points) (pt educated and given a brochure on fall risk previously)    Review of Systems  Constitutional: Positive for unexpected weight change.  Endocrine:       High blood sugar  Musculoskeletal: Positive for gait problem and neck pain.  Neurological: Positive for weakness.       Tingling  Psychiatric/Behavioral: The patient is nervous/anxious.  All other systems reviewed and are negative.      Objective:   Physical Exam        Assessment & Plan:

## 2014-02-04 NOTE — Progress Notes (Signed)
Subjective:    Patient ID: Monique [name], female    DOB: [birth year], 28 y.o.   MRN: [id number]  HPI: [name] is a 28 year old female who returns for follow up for chronic pain and medication refill. She says she's been having left neck pain and bilateral foot pain. She rates her pain 3. She noticed she has become more focused with taking Ritalin. She admits with being overwhelmed with the stressors in her life a single mother with two children. She  has an aide who helps with her housework and meals. She is very anxious and has panic attacks at times, she feels frustrated at times and learning how to adapt to the stress in her life.Emotional support given and all questions answered. She was wondering about a biopsy she had in 04/2013, the results were found and conveyed to her. She has been having increase neck pain and will be obtaining an appointment with Dr. Vertell Limber the neurologist.  Pain Inventory Average Pain 6 Pain Right Now 3 My pain is sharp, stabbing and aching  In the last 24 hours, has pain interfered with the following? General activity 7 Relation with others 7 Enjoyment of life 7 What TIME of day is your pain at its worst? morning,evening, night Sleep (in general) Fair  Pain is worse with: walking, bending and some activites Pain improves with: rest, pacing activities and medication Relief from Meds: 7  Mobility walk without assistance how many minutes can you walk? 15 ability to climb steps?  yes transfers alone Do you have any goals in this area?  yes  Function disabled: date disabled na Do you have any goals in this area?  no  Neuro/Psych weakness tingling trouble walking anxiety  Prior Studies Any changes since last visit?  no  Physicians involved in your care Any changes since last visit?  no   Family History  Problem Relation Age of Onset  . Cancer Paternal Grandmother     Ovarian   History   Social History  . Marital  Status: Single    Spouse Name: N/A    Number of Children: N/A  . Years of Education: N/A   Social History Main Topics  . Smoking status: Passive Smoke Exposure - Never Smoker  . Smokeless tobacco: Never Used  . Alcohol Use: Yes     Comment: occasional  . Drug Use: No  . Sexual Activity: Yes   Other Topics Concern  . None   Social History Narrative  . None   Past Surgical History  Procedure Laterality Date  . Orif ankle fracture Left 12/14/2012    Procedure: OPEN REDUCTION INTERNAL FIXATION (ORIF) ANKLE FRACTURE;  Surgeon: [name], MD;  Location: [place];  Service: Orthopedics;  Laterality: Left;  . I&d extremity Left 12/14/2012    Procedure: IRRIGATION AND DEBRIDEMENT EXTREMITY;  Surgeon: [name], MD;  Location: [place];  Service: Orthopedics;  Laterality: Left;  . External fixation leg Bilateral 12/14/2012    Procedure: EXTERNAL FIXATION LEG;  Surgeon: [name], MD;  Location: [place];  Service: Orthopedics;  Laterality: Bilateral;  . Appendectomy    . Ceasarean section  2007, 2010  . Orif tibia fracture Bilateral 12/23/2012    Procedure: OPEN REDUCTION INTERNAL FIXATION (ORIF) BILATERAL DISTAL TIBIA FRACTURE;  Surgeon: [name], MD;  Location: [place];  Service: Orthopedics;  Laterality: Bilateral;  . External fixation removal Bilateral 12/23/2012    Procedure: REMOVAL EXTERNAL FIXATION LEG;  Surgeon:  [name], MD;  Location: [place];  Service: Orthopedics;  Laterality: Bilateral;   Past Medical History  Diagnosis Date  . Depression   . Anxiety   . Heart murmur   . Anemia    BP 130/89  Pulse 88  Resp 14  Ht 5\' 5"  (1.651 m)  Wt 201 lb (91.173 kg)  BMI 33.45 kg/m2  SpO2 98%  Opioid Risk Score:   Fall Risk Score: Low Fall Risk (0-5 points) (pt educated and given a brochure on fall risk previously)    Review of Systems  Constitutional: Positive for unexpected weight change.  Endocrine:       High blood sugar  Musculoskeletal: Positive for gait  problem and neck pain.  Neurological: Positive for weakness.       Tingling  Psychiatric/Behavioral: The patient is nervous/anxious.   All other systems reviewed and are negative.      Objective:   Physical Exam  Nursing note and vitals reviewed. Constitutional: She is oriented to person, place, and time. She appears well-developed and well-nourished.  HENT:  Head: Normocephalic and atraumatic.  Neck: Normal range of motion. Neck supple.  No signs of Left neck tenderness or pain at this time.  Cardiovascular: Normal rate, regular rhythm and normal heart sounds.   Pulmonary/Chest: Effort normal and breath sounds normal.  Musculoskeletal: Normal range of motion.  Normal Muscle Bulk: Muscle Testing Reveals: Upper and Lower Extremities Muscle Strength 5/5. Full Range of Motion. Back without spinal or paraspinal tenderness. Old incisions bilateral foot with Keloid formation. Has trace edema Noted. Narrow based gait. Arises from Chair with ease  Neurological: She is alert and oriented to person, place, and time.  Skin: Skin is warm and dry.  Psychiatric: She has a normal mood and affect.  Speech clear and concise. Focused and Express concerns appropriately          Assessment & Plan:  1. STM:[id number] to Monitor/More Focused: Continue Ritalin 2. Bilateral Pilon Fractures: Continue Zanaflex and Mobic 3. Panic Attacks: Continue Klonopin 4. Tibial Nerve Injuries: Refilled: OXYcodone 10 mg 0.5-1 tablet every 6 hours as needed. #60  30 minutes of face to face patient care time was spent during this visit. All questions were encouraged and answered.  F/U Visit in 1 month

## 2014-02-04 NOTE — Progress Notes (Signed)
Call made to [facility] after she left the office to read her the biopsy report on her thyroid nodule in July of 2014, which was benign.  As Danella Sensing NP instructed her, as well, I told her to follow up with PCP if she is having further symptoms with her neck

## 2014-03-04 ENCOUNTER — Ambulatory Visit: Payer: Medicaid Other | Admitting: Registered Nurse

## 2014-03-04 ENCOUNTER — Encounter: Payer: Medicaid Other | Admitting: Registered Nurse

## 2014-03-25 ENCOUNTER — Telehealth: Payer: Self-pay

## 2014-03-25 DIAGNOSIS — G5740 Lesion of medial popliteal nerve, unspecified lower limb: Secondary | ICD-10-CM

## 2014-03-25 MED ORDER — OXYCODONE HCL 10 MG PO TABS: 5.0000 mg | ORAL_TABLET | Freq: Four times a day (QID) | ORAL | Status: DC | PRN

## 2014-03-25 NOTE — Telephone Encounter (Signed)
Patient is requesting a  refill on Oxycodone. Appt is on 6/24 and patient will be out of medication before then. Printed RX for Dr. Naaman Plummer to sign. Patient will pick up on Friday afternoon.

## 2014-03-26 NOTE — Telephone Encounter (Signed)
Contacted patient to inform her that Oxycodone RX is ready for pickup.

## 2014-03-31 ENCOUNTER — Encounter: Payer: Self-pay | Admitting: Physical Medicine & Rehabilitation

## 2014-03-31 ENCOUNTER — Encounter: Payer: Medicaid Other | Attending: Physical Medicine & Rehabilitation | Admitting: Physical Medicine & Rehabilitation

## 2014-03-31 VITALS — BP 122/84 | HR 93 | Resp 14 | Ht 65.0 in | Wt 204.0 lb

## 2014-03-31 DIAGNOSIS — X58XXXA Exposure to other specified factors, initial encounter: Secondary | ICD-10-CM | POA: Insufficient documentation

## 2014-03-31 DIAGNOSIS — S82891A Other fracture of right lower leg, initial encounter for closed fracture: Secondary | ICD-10-CM

## 2014-03-31 DIAGNOSIS — IMO0002 Reserved for concepts with insufficient information to code with codable children: Secondary | ICD-10-CM

## 2014-03-31 DIAGNOSIS — S060X6A Concussion with loss of consciousness greater than 24 hours without return to pre-existing conscious level with patient surviving, initial encounter: Secondary | ICD-10-CM

## 2014-03-31 DIAGNOSIS — S060X9A Concussion with loss of consciousness of unspecified duration, initial encounter: Secondary | ICD-10-CM

## 2014-03-31 DIAGNOSIS — S12100S Unspecified displaced fracture of second cervical vertebra, sequela: Secondary | ICD-10-CM

## 2014-03-31 DIAGNOSIS — S069X5A Unspecified intracranial injury with loss of consciousness greater than 24 hours with return to pre-existing conscious level, initial encounter: Secondary | ICD-10-CM | POA: Insufficient documentation

## 2014-03-31 DIAGNOSIS — S82899B Other fracture of unspecified lower leg, initial encounter for open fracture type I or II: Secondary | ICD-10-CM | POA: Insufficient documentation

## 2014-03-31 DIAGNOSIS — S82892A Other fracture of left lower leg, initial encounter for closed fracture: Secondary | ICD-10-CM

## 2014-03-31 MED ORDER — METHYLPHENIDATE HCL 5 MG PO TABS: 5.0000 mg | ORAL_TABLET | Freq: Two times a day (BID) | ORAL | Status: DC

## 2014-03-31 MED ORDER — MORPHINE SULFATE 15 MG PO TABS: 15.0000 mg | ORAL_TABLET | Freq: Three times a day (TID) | ORAL | Status: DC | PRN

## 2014-03-31 NOTE — Progress Notes (Signed)
Subjective:    Patient ID: Monique [name], female    DOB: Jun 20, 1986, 28 y.o.   MRN: [id number]  HPI  Monique [name] is back regarding her chronic pain and BI issues. She has noticed over the last few months, problems with "swelling" and sometimes swallowing. She is also noticing night-sweats. There has been associated sternal pain and sometimes neck pain.  She continues on ritalin most days which helps with attention.   SHe typically is using two oxycodones per day. She uses the tizanidine occasionally also.   She is busy still with home making and her children.     Pain Inventory Average Pain 7 Pain Right Now 3 My pain is constant, sharp, stabbing and aching  In the last 24 hours, has pain interfered with the following? General activity 8 Relation with others 7 Enjoyment of life 7 What TIME of day is your pain at its worst? evening, night Sleep (in general) Poor  Pain is worse with: walking, bending, standing and some activites Pain improves with: medication Relief from Meds: 4  Mobility walk without assistance how many minutes can you walk? 15-20 ability to climb steps?  yes transfers alone Do you have any goals in this area?  yes  Function not employed: date last employed na  Neuro/Psych weakness confusion  Prior Studies Any changes since last visit?  no  Physicians involved in your care Any changes since last visit?  no   Family History  Problem Relation Age of Onset  . Cancer Paternal Grandmother     Ovarian   History   Social History  . Marital Status: Single    Spouse Name: N/A    Number of Children: N/A  . Years of Education: N/A   Social History Main Topics  . Smoking status: Passive Smoke Exposure - Never Smoker  . Smokeless tobacco: Never Used  . Alcohol Use: Yes     Comment: occasional  . Drug Use: No  . Sexual Activity: Yes   Other Topics Concern  . None   Social History Narrative  . None   Past Surgical History    Procedure Laterality Date  . Orif ankle fracture Left 12/14/2012    Procedure: OPEN REDUCTION INTERNAL FIXATION (ORIF) ANKLE FRACTURE;  Surgeon: [name], MD;  Location: [place];  Service: Orthopedics;  Laterality: Left;  . I&d extremity Left 12/14/2012    Procedure: IRRIGATION AND DEBRIDEMENT EXTREMITY;  Surgeon: [name], MD;  Location: [place];  Service: Orthopedics;  Laterality: Left;  . External fixation leg Bilateral 12/14/2012    Procedure: EXTERNAL FIXATION LEG;  Surgeon: [name], MD;  Location: [place];  Service: Orthopedics;  Laterality: Bilateral;  . Appendectomy    . Ceasarean section  2007, 2010  . Orif tibia fracture Bilateral 12/23/2012    Procedure: OPEN REDUCTION INTERNAL FIXATION (ORIF) BILATERAL DISTAL TIBIA FRACTURE;  Surgeon: [name], MD;  Location: [place];  Service: Orthopedics;  Laterality: Bilateral;  . External fixation removal Bilateral 12/23/2012    Procedure: REMOVAL EXTERNAL FIXATION LEG;  Surgeon: [name], MD;  Location: [place];  Service: Orthopedics;  Laterality: Bilateral;   Past Medical History  Diagnosis Date  . Depression   . Anxiety   . Heart murmur   . Anemia    BP 122/84  Pulse 93  Resp 14  Ht 5\' 5"  (1.651 m)  Wt 204 lb (92.534 kg)  BMI 33.95 kg/m2  SpO2 99%  Opioid Risk Score:  Fall Risk Score: Moderate Fall Risk (6-13 points) (pt educated on fall risk, brochure given to pt previously)    Review of Systems  Neurological: Positive for weakness.  Psychiatric/Behavioral: Positive for confusion.  All other systems reviewed and are negative.      Objective:   Physical Exam  General: Alert and oriented x 3, No apparent distress  HEENT: Head is normocephalic, atraumatic, PERRLA, EOMI, sclera anicteric, oral mucosa pink and moist, dentition intact, ext ear canals clear,  Neck: Supple without JVD or lymphadenopathy  Heart: Reg rate and rhythm. No murmurs rubs or gallops  Chest: CTA bilaterally without wheezes, rales,  or rhonchi; no distress  Abdomen: Soft, non-tender, non-distended, bowel sounds positive.  Extremities: No clubbing, cyanosis, notable edema as below. Pulses are 2+  Skin: Clean and intact without signs of breakdown  Neuro: Pt is cognitively appropriate with normal insight, and awareness. Cranial nerves 2-12 are intact. Sensory exam is normal except for the bases of the feet which are hypersensitive to touch and lack FT discrimination. Reflexes are 2+ in all 4's. UE motor 5/5. LE: 4/5 HF, KE, 2-3 at. Loses attention at times but better.  Musculoskeletal: Callus noted at ankles, with associated swelling as well. Ankles were somewhat tender to palpation. ROM remains limited. She walks with little ADF and has to place the right/left leg far out in front with the knee fully extended to allow weight bearing through the right/left foot. Antalgia is noted on either leg. Sternum tender to touch, mid  Body. mildy tenderness on soft tissue of neck, no limitations in cervical rom, no phonation/swallowing pain.  Psych: Pt's affect is pleasant and cooperative.  Assessment & Plan:   1. DVT Prophylaxis/Anticoagulation: Pharmaceutical: Coumadin-Continue until she is routinely walking around her home  2. Pain Management/ tibial nerve injuries: Change to ms IR 15mg  q8 prn ( #60) to see if sweating improves. She will also follow up with PCP regarding thyroid labs, general bloodwork. 3. Bilateral pilon fractures  -prn Meloxicam, tylenol, ice/heat  -adaptive strategies including sitting/rest breaks, etcd  -reviewed heel cord stretching. May use tizanidine for cramps  4. C2 fracture: healed   -re-check xrays of neck to assess for spurs or post-traumatic changes.  5. TBI: .  -continue ritalin 5mg  bid   -prn klonopin for anxiety attacks.  -might benefit from neuropsych input at some point  6. Follow up in 1 months time with NP. 25 minutes of face to face patient care time were spent during this visit. All questions  were encouraged and answered.

## 2014-03-31 NOTE — Patient Instructions (Signed)
IF YOU DON'T TOLERATE THE MORPHINE, YOU CAN GO BACK TO THE OXYCODONE OR WE CAN TRY SOMETHING ELSE   CHECK TO MAKE SURE YOUR THYROID HORMONE LEVELS ARE NORMAL.

## 2014-04-20 ENCOUNTER — Telehealth: Payer: Self-pay | Admitting: *Deleted
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
## 2014-04-26 ENCOUNTER — Encounter: Payer: Medicaid Other | Admitting: Registered Nurse

## 2014-04-26 ENCOUNTER — Encounter: Payer: Self-pay | Admitting: Registered Nurse

## 2014-04-26 ENCOUNTER — Encounter: Payer: Medicaid Other | Attending: Physical Medicine & Rehabilitation | Admitting: Registered Nurse

## 2014-04-26 VITALS — BP 124/65 | HR 83 | Resp 14 | Wt 198.8 lb

## 2014-04-26 DIAGNOSIS — X58XXXA Exposure to other specified factors, initial encounter: Secondary | ICD-10-CM | POA: Insufficient documentation

## 2014-04-26 DIAGNOSIS — S069X5A Unspecified intracranial injury with loss of consciousness greater than 24 hours with return to pre-existing conscious level, initial encounter: Secondary | ICD-10-CM

## 2014-04-26 DIAGNOSIS — S060X6A Concussion with loss of consciousness greater than 24 hours without return to pre-existing conscious level with patient surviving, initial encounter: Secondary | ICD-10-CM

## 2014-04-26 DIAGNOSIS — IMO0002 Reserved for concepts with insufficient information to code with codable children: Secondary | ICD-10-CM

## 2014-04-26 DIAGNOSIS — S82899B Other fracture of unspecified lower leg, initial encounter for open fracture type I or II: Secondary | ICD-10-CM

## 2014-04-26 DIAGNOSIS — Z79899 Other long term (current) drug therapy: Secondary | ICD-10-CM

## 2014-04-26 DIAGNOSIS — Z5181 Encounter for therapeutic drug level monitoring: Secondary | ICD-10-CM

## 2014-04-26 DIAGNOSIS — S82892A Other fracture of left lower leg, initial encounter for closed fracture: Secondary | ICD-10-CM

## 2014-04-26 DIAGNOSIS — S82891A Other fracture of right lower leg, initial encounter for closed fracture: Secondary | ICD-10-CM

## 2014-04-26 DIAGNOSIS — S060X9A Concussion with loss of consciousness of unspecified duration, initial encounter: Secondary | ICD-10-CM

## 2014-04-26 DIAGNOSIS — S12100S Unspecified displaced fracture of second cervical vertebra, sequela: Secondary | ICD-10-CM

## 2014-04-26 MED ORDER — METHYLPHENIDATE HCL 5 MG PO TABS: 5.0000 mg | ORAL_TABLET | Freq: Two times a day (BID) | ORAL | Status: DC

## 2014-04-26 MED ORDER — OXYCODONE HCL 10 MG PO TABS: 10.0000 mg | ORAL_TABLET | Freq: Two times a day (BID) | ORAL | Status: DC | PRN

## 2014-04-26 NOTE — Progress Notes (Signed)
Subjective:    Patient ID: Monique [name], female    DOB: September 12, 1986, 28 y.o.   MRN: [id number]  HPI: [name] is a 28 year old female who returns for follow up for chronic pain and medication refill. She says her pain is located in her bilateral feet. She rates her pain 0. Her current exercise regime is riding her stationary bicycle 5 days a week for 25-30 minutes. She asked to go back on oxycodone says the Morphine made her ill.   Pain Inventory Average Pain 7 Pain Right Now 0 My pain is intermittent, sharp and tingling  In the last 24 hours, has pain interfered with the following? General activity 7 Relation with others 7 Enjoyment of life 7 What TIME of day is your pain at its worst? evening Sleep (in general) Fair  Pain is worse with: walking, bending, standing and some activites Pain improves with: rest and medication Relief from Meds: 7  Mobility walk without assistance ability to climb steps?  yes do you drive?  yes  Function disabled: date disabled in process I need assistance with the following:  meal prep, household duties and shopping  Neuro/Psych weakness spasms depression anxiety  Prior Studies Any changes since last visit?  no  Physicians involved in your care Any changes since last visit?  no   Family History  Problem Relation Age of Onset  . Cancer Paternal Grandmother     Ovarian   History   Social History  . Marital Status: Single    Spouse Name: N/A    Number of Children: N/A  . Years of Education: N/A   Social History Main Topics  . Smoking status: Passive Smoke Exposure - Never Smoker  . Smokeless tobacco: Never Used  . Alcohol Use: Yes     Comment: occasional  . Drug Use: No  . Sexual Activity: Yes   Other Topics Concern  . None   Social History Narrative  . None   Past Surgical History  Procedure Laterality Date  . Orif ankle fracture Left 12/14/2012    Procedure: OPEN REDUCTION INTERNAL FIXATION  (ORIF) ANKLE FRACTURE;  Surgeon: [name], MD;  Location: [place];  Service: Orthopedics;  Laterality: Left;  . I&d extremity Left 12/14/2012    Procedure: IRRIGATION AND DEBRIDEMENT EXTREMITY;  Surgeon: [name], MD;  Location: [place];  Service: Orthopedics;  Laterality: Left;  . External fixation leg Bilateral 12/14/2012    Procedure: EXTERNAL FIXATION LEG;  Surgeon: [name], MD;  Location: [place];  Service: Orthopedics;  Laterality: Bilateral;  . Appendectomy    . Ceasarean section  2007, 2010  . Orif tibia fracture Bilateral 12/23/2012    Procedure: OPEN REDUCTION INTERNAL FIXATION (ORIF) BILATERAL DISTAL TIBIA FRACTURE;  Surgeon: [name], MD;  Location: [place];  Service: Orthopedics;  Laterality: Bilateral;  . External fixation removal Bilateral 12/23/2012    Procedure: REMOVAL EXTERNAL FIXATION LEG;  Surgeon: [name], MD;  Location: [place];  Service: Orthopedics;  Laterality: Bilateral;   Past Medical History  Diagnosis Date  . Depression   . Anxiety   . Heart murmur   . Anemia    BP 124/65  Pulse 83  Resp 14  Wt 198 lb 12.8 oz (90.175 kg)  SpO2 99%  Opioid Risk Score:   Fall Risk Score: Low Fall Risk (0-5 points) (previously educated and given handout on fall prevention)  Review of Systems  Musculoskeletal:  Spasms  Neurological: Positive for weakness.  Psychiatric/Behavioral: Positive for dysphoric mood. The patient is nervous/anxious.   All other systems reviewed and are negative.      Objective:   Physical Exam  Nursing note and vitals reviewed. Constitutional: She is oriented to person, place, and time. She appears well-developed and well-nourished.  HENT:  Head: Normocephalic and atraumatic.  Neck: Normal range of motion. Neck supple.  Cardiovascular: Normal rate and regular rhythm.   Pulmonary/Chest: Effort normal and breath sounds normal.  Musculoskeletal:  Normal Muscle Bulk and Muscle Testing Reveals: Upper Extremities: Full  ROM and Muscle Strength 5/5 Lower Extremities: Full ROM and Muscle Strength 4/5 Arises from chair with ease Narrow based Gait  Neurological: She is alert and oriented to person, place, and time.  Skin: Skin is warm and dry.  Psychiatric: She has a normal mood and affect.          Assessment & Plan:  1. EUM:PNTIRWER to Monitor/More Focused: Continue Ritalin  2. Bilateral Pilon Fractures: Continue Zanaflex and Mobic  3. Panic Attacks: Continue Klonopin  4. Tibial Nerve Injuries: Refilled: OXYcodone 10 mg 1 tablet twice a day as needed. #60   30 minutes of face to face patient care time was spent during this visit. All questions were encouraged and answered.   F/U Visit in 1 month

## 2014-04-27 ENCOUNTER — Ambulatory Visit: Payer: Medicaid Other | Admitting: Registered Nurse

## 2014-05-04 ENCOUNTER — Encounter: Payer: Self-pay | Admitting: Physical Medicine & Rehabilitation

## 2014-05-21 ENCOUNTER — Telehealth: Payer: Self-pay | Admitting: Physical Medicine & Rehabilitation

## 2014-05-21 NOTE — Telephone Encounter (Signed)
Pt was called about her reminder appointment and she states she will be out of town... She states she does not understand why her appointment was changed and want [place] to know she will be out of med come her following appointment .Marland Kitchen... Pt was advise that a change in the scheduling and letter was sent to her plus she was told a note is being sent to the clinical staff; so one  Will be aware of her not having her meds

## 2014-05-24 ENCOUNTER — Encounter: Payer: Medicaid Other | Admitting: Registered Nurse

## 2014-05-28 ENCOUNTER — Telehealth: Payer: Self-pay

## 2014-05-28 MED ORDER — OXYCODONE HCL 10 MG PO TABS: 10.0000 mg | ORAL_TABLET | Freq: Two times a day (BID) | ORAL | Status: DC | PRN

## 2014-05-28 NOTE — Telephone Encounter (Signed)
Patient is requesting a refill on Oxycodone. She will be out of medication before her appt. RX printed for Dr.Swartz to sign. Contacted patient to inform her RX would be ready for pickup this afternoon.

## 2014-05-31 ENCOUNTER — Ambulatory Visit: Payer: Medicaid Other | Admitting: Registered Nurse

## 2014-06-02 ENCOUNTER — Encounter: Payer: Medicaid Other | Admitting: Registered Nurse

## 2014-06-25 ENCOUNTER — Encounter: Payer: Medicaid Other | Attending: Physical Medicine & Rehabilitation | Admitting: Physical Medicine & Rehabilitation

## 2014-06-25 ENCOUNTER — Ambulatory Visit
Admission: RE | Admit: 2014-06-25 | Discharge: 2014-06-25 | Disposition: A | Discharge status: Home / Self Care | Payer: Medicaid Other | Source: Ambulatory Visit | Attending: Physical Medicine & Rehabilitation | Admitting: Physical Medicine & Rehabilitation

## 2014-06-25 ENCOUNTER — Encounter: Payer: Self-pay | Admitting: Physical Medicine & Rehabilitation

## 2014-06-25 VITALS — BP 122/72 | HR 78 | Resp 14 | Wt 191.6 lb

## 2014-06-25 DIAGNOSIS — S069X5D Unspecified intracranial injury with loss of consciousness greater than 24 hours with return to pre-existing conscious level, subsequent encounter: Secondary | ICD-10-CM

## 2014-06-25 DIAGNOSIS — G5780 Other specified mononeuropathies of unspecified lower limb: Secondary | ICD-10-CM

## 2014-06-25 DIAGNOSIS — S82899B Other fracture of unspecified lower leg, initial encounter for open fracture type I or II: Secondary | ICD-10-CM

## 2014-06-25 DIAGNOSIS — S069X5A Unspecified intracranial injury with loss of consciousness greater than 24 hours with return to pre-existing conscious level, initial encounter: Secondary | ICD-10-CM | POA: Diagnosis not present

## 2014-06-25 DIAGNOSIS — S12100S Unspecified displaced fracture of second cervical vertebra, sequela: Secondary | ICD-10-CM

## 2014-06-25 DIAGNOSIS — S82892A Other fracture of left lower leg, initial encounter for closed fracture: Secondary | ICD-10-CM

## 2014-06-25 DIAGNOSIS — Z5189 Encounter for other specified aftercare: Secondary | ICD-10-CM

## 2014-06-25 DIAGNOSIS — S82891A Other fracture of right lower leg, initial encounter for closed fracture: Secondary | ICD-10-CM

## 2014-06-25 DIAGNOSIS — G5740 Lesion of medial popliteal nerve, unspecified lower limb: Secondary | ICD-10-CM

## 2014-06-25 DIAGNOSIS — IMO0002 Reserved for concepts with insufficient information to code with codable children: Secondary | ICD-10-CM

## 2014-06-25 MED ORDER — METHYLPHENIDATE HCL 5 MG PO TABS: 5.0000 mg | ORAL_TABLET | Freq: Two times a day (BID) | ORAL | Status: DC

## 2014-06-25 MED ORDER — OXYCODONE HCL 10 MG PO TABS: 10.0000 mg | ORAL_TABLET | Freq: Two times a day (BID) | ORAL | Status: DC | PRN

## 2014-06-25 NOTE — Patient Instructions (Signed)
HEAT,ICE, STRETCHING, MASSAGE TO THE RIGHT NECK AREA.   TRY TO SLEEP IN  A "NEUTRAL" HEAD POSITION

## 2014-06-25 NOTE — Progress Notes (Signed)
Subjective:    Patient ID: Monique [name], female    DOB: 06-Oct-1986, 28 y.o.   MRN: [id number]  HPI  Monique [name] is back regarding her multiple issues. She has had some pain and "creaking" in her neck which has been concerning.  She is now "driving" locally. She is using a calendar but not much else to help organize herself. She has an aide who helps her clean the house and cook.   From a standpoint of her feet/ankles, they have remained essentially the same.   They remain sensitive to touch.      Pain Inventory Average Pain 6 Pain Right Now 5 My pain is constant, sharp, burning and tingling  In the last 24 hours, has pain interfered with the following? General activity 8 Relation with others 8 Enjoyment of life 8 What TIME of day is your pain at its worst? morning, evening Sleep (in general) Fair  Pain is worse with: walking and some activites Pain improves with: rest and heat/ice Relief from Meds: 3 .5   Mobility walk without assistance  Function disabled: date disabled .  Neuro/Psych No problems in this area  Prior Studies Any changes since last visit?  no  Physicians involved in your care .   Family History  Problem Relation Age of Onset  . Cancer Paternal Grandmother     Ovarian   History   Social History  . Marital Status: Single    Spouse Name: N/A    Number of Children: N/A  . Years of Education: N/A   Social History Main Topics  . Smoking status: Passive Smoke Exposure - Never Smoker  . Smokeless tobacco: Never Used  . Alcohol Use: Yes     Comment: occasional  . Drug Use: No  . Sexual Activity: Yes   Other Topics Concern  . None   Social History Narrative  . None   Past Surgical History  Procedure Laterality Date  . Orif ankle fracture Left 12/14/2012    Procedure: OPEN REDUCTION INTERNAL FIXATION (ORIF) ANKLE FRACTURE;  Surgeon: [name], MD;  Location: [place];  Service: Orthopedics;  Laterality: Left;  . I&d extremity  Left 12/14/2012    Procedure: IRRIGATION AND DEBRIDEMENT EXTREMITY;  Surgeon: [name], MD;  Location: [place];  Service: Orthopedics;  Laterality: Left;  . External fixation leg Bilateral 12/14/2012    Procedure: EXTERNAL FIXATION LEG;  Surgeon: [name], MD;  Location: [place];  Service: Orthopedics;  Laterality: Bilateral;  . Appendectomy    . Ceasarean section  2007, 2010  . Orif tibia fracture Bilateral 12/23/2012    Procedure: OPEN REDUCTION INTERNAL FIXATION (ORIF) BILATERAL DISTAL TIBIA FRACTURE;  Surgeon: [name], MD;  Location: [place];  Service: Orthopedics;  Laterality: Bilateral;  . External fixation removal Bilateral 12/23/2012    Procedure: REMOVAL EXTERNAL FIXATION LEG;  Surgeon: [name], MD;  Location: [place];  Service: Orthopedics;  Laterality: Bilateral;   Past Medical History  Diagnosis Date  . Depression   . Anxiety   . Heart murmur   . Anemia    BP 122/72  Pulse 78  Resp 14  Wt 191 lb 9.6 oz (86.909 kg)  SpO2 98%  Opioid Risk Score:   Fall Risk Score:    Review of Systems     Objective:   Physical Exam  General: Alert and oriented x 3, No apparent distress  HEENT: Head is normocephalic, atraumatic, PERRLA, EOMI, sclera anicteric, oral mucosa pink  and moist, dentition intact, ext ear canals clear,  Neck: Supple without JVD or lymphadenopathy  Heart: Reg rate and rhythm. No murmurs rubs or gallops  Chest: CTA bilaterally without wheezes, rales, or rhonchi; no distress  Abdomen: Soft, non-tender, non-distended, bowel sounds positive.  Extremities: No clubbing, cyanosis, notable edema as below. Pulses are 2+  Skin: Clean and intact without signs of breakdown  Neuro: Pt is cognitively appropriate with normal insight, and awareness. Cranial nerves 2-12 are intact. Sensory exam is normal except for the bases of the feet which are hypersensitive to touch and lack FT discrimination. Reflexes are 2+ in all 4's. UE motor 5/5. LE: 4/5 HF, KE, 2-3 at.  Loses attention at times but better.  Musculoskeletal: Callus noted at ankles, with associated swelling as well. Ankles were somewhat tender to palpation. ROM remains limited. She walks with little ADF and has to place the right/left leg far out in front with the knee fully extended to allow weight bearing through the right/left foot. Antalgia is noted on either leg. Sternum tender to touch, mid Body. mildy tenderness on soft tissue of neck, no limitations in cervical rom, no phonation/swallowing pain.  Psych: Pt's affect is pleasant and cooperative.    Assessment & Plan:   1. Pain Management/ tibial nerve injuries: Change to ms IR 15mg  q8 prn ( #60) to see if sweating improves. She will also follow up with PCP regarding thyroid labs, general bloodwork.  2. Bilateral pilon fractures  -continue prn Meloxicam, tylenol, ice/heat    3. C2 fracture: healed  -re-check xrays after visit to assess for spurs or post-traumatic changes given ongoing cervicalgia -reviewed myofascial rx as well 5. TBI: .  -continue ritalin 5mg  bid  -prn klonopin for anxiety attacks. -discussed better organizational routine/use of her smart phone, etc   6. Follow up in 2 months time. She may pick up rx next month. 30 minutes of face to face patient care time were spent during this visit. All questions were encouraged and answered.

## 2014-06-29 ENCOUNTER — Telehealth: Payer: Self-pay | Admitting: Physical Medicine & Rehabilitation

## 2014-06-29 NOTE — Telephone Encounter (Signed)
Left message to return our call.

## 2014-06-29 NOTE — Telephone Encounter (Signed)
Please let pt know that xray of neck shows expected changes after prior fracture. No instability seen. Focus on neck range of motion, heat/ice/massage, etc---no limitations

## 2014-06-30 NOTE — Telephone Encounter (Signed)
Dr Naaman Plummer message given to [facility]

## 2014-07-01 ENCOUNTER — Telehealth: Payer: Self-pay | Admitting: *Deleted

## 2014-07-01 NOTE — Telephone Encounter (Signed)
Notified Shivonne that letter has been sent.

## 2014-07-01 NOTE — Telephone Encounter (Signed)
Letter written to request assistance for Monique [name] with accommodations for her utilities.

## 2014-07-01 NOTE — Telephone Encounter (Signed)
Patient needs a letter stating that due to her medical condition she must have heat/air conditioning.

## 2014-07-02 NOTE — Telephone Encounter (Signed)
Thank you :)

## 2014-07-26 ENCOUNTER — Other Ambulatory Visit: Payer: Self-pay | Admitting: *Deleted

## 2014-07-26 DIAGNOSIS — G5740 Lesion of medial popliteal nerve, unspecified lower limb: Secondary | ICD-10-CM

## 2014-07-26 DIAGNOSIS — S069X5D Unspecified intracranial injury with loss of consciousness greater than 24 hours with return to pre-existing conscious level, subsequent encounter: Secondary | ICD-10-CM

## 2014-07-26 DIAGNOSIS — S12100S Unspecified displaced fracture of second cervical vertebra, sequela: Secondary | ICD-10-CM

## 2014-07-26 DIAGNOSIS — S82892A Other fracture of left lower leg, initial encounter for closed fracture: Secondary | ICD-10-CM

## 2014-07-26 DIAGNOSIS — S82891A Other fracture of right lower leg, initial encounter for closed fracture: Secondary | ICD-10-CM

## 2014-07-26 MED ORDER — OXYCODONE HCL 10 MG PO TABS: 10.0000 mg | ORAL_TABLET | Freq: Two times a day (BID) | ORAL | Status: DC | PRN

## 2014-07-26 NOTE — Telephone Encounter (Signed)
Called for refill on pain meds... OK to refill, 2 month f/u scheduled.  Printed and put on doctors desk

## 2014-07-27 ENCOUNTER — Telehealth: Payer: Self-pay | Admitting: *Deleted

## 2014-07-27 NOTE — Telephone Encounter (Signed)
Called patient and left message that her RX is ready for pick up.  Putting RX in the book.

## 2014-08-03 ENCOUNTER — Telehealth: Payer: Self-pay | Admitting: Physical Medicine & Rehabilitation

## 2014-08-03 NOTE — Telephone Encounter (Signed)
RECEIVED RESIDUAL FUNCTIONAL CAPACITY FORM FROM ATTORNEY FAXED TO [facility] [address] TO BE COMPLETED. CONFIRMATION RECEIVED.Marland KitchenMarland KitchenJMN

## 2014-08-25 ENCOUNTER — Encounter: Payer: Medicaid Other | Admitting: Physical Medicine & Rehabilitation

## 2014-08-31 ENCOUNTER — Encounter: Payer: Medicaid Other | Attending: Physical Medicine & Rehabilitation | Admitting: Physical Medicine & Rehabilitation

## 2014-08-31 ENCOUNTER — Encounter: Payer: Self-pay | Admitting: Physical Medicine & Rehabilitation

## 2014-08-31 VITALS — BP 122/49 | HR 87 | Resp 14 | Ht 65.0 in | Wt 194.0 lb

## 2014-08-31 DIAGNOSIS — S069X5D Unspecified intracranial injury with loss of consciousness greater than 24 hours with return to pre-existing conscious level, subsequent encounter: Secondary | ICD-10-CM

## 2014-08-31 DIAGNOSIS — S82891A Other fracture of right lower leg, initial encounter for closed fracture: Secondary | ICD-10-CM

## 2014-08-31 DIAGNOSIS — R4189 Other symptoms and signs involving cognitive functions and awareness: Secondary | ICD-10-CM

## 2014-08-31 DIAGNOSIS — S12100S Unspecified displaced fracture of second cervical vertebra, sequela: Secondary | ICD-10-CM

## 2014-08-31 DIAGNOSIS — S069X0S Unspecified intracranial injury without loss of consciousness, sequela: Secondary | ICD-10-CM

## 2014-08-31 DIAGNOSIS — S82892A Other fracture of left lower leg, initial encounter for closed fracture: Secondary | ICD-10-CM | POA: Diagnosis present

## 2014-08-31 DIAGNOSIS — G589 Mononeuropathy, unspecified: Secondary | ICD-10-CM | POA: Diagnosis present

## 2014-08-31 DIAGNOSIS — F068 Other specified mental disorders due to known physiological condition: Secondary | ICD-10-CM

## 2014-08-31 DIAGNOSIS — S069X5A Unspecified intracranial injury with loss of consciousness greater than 24 hours with return to pre-existing conscious level, initial encounter: Secondary | ICD-10-CM

## 2014-08-31 DIAGNOSIS — S069X3A Unspecified intracranial injury with loss of consciousness of 1 hour to 5 hours 59 minutes, initial encounter: Secondary | ICD-10-CM

## 2014-08-31 DIAGNOSIS — R221 Localized swelling, mass and lump, neck: Secondary | ICD-10-CM

## 2014-08-31 DIAGNOSIS — G5740 Lesion of medial popliteal nerve, unspecified lower limb: Secondary | ICD-10-CM

## 2014-08-31 MED ORDER — OXYCODONE HCL 10 MG PO TABS: 10.0000 mg | ORAL_TABLET | Freq: Two times a day (BID) | ORAL | Status: DC | PRN

## 2014-08-31 MED ORDER — METHYLPHENIDATE HCL 5 MG PO TABS: 5.0000 mg | ORAL_TABLET | Freq: Two times a day (BID) | ORAL | Status: DC

## 2014-08-31 NOTE — Patient Instructions (Signed)
PLEASE CALL ME WITH ANY PROBLEMS OR QUESTIONS (#[phone]).      

## 2014-08-31 NOTE — Progress Notes (Signed)
Subjective:    Patient ID: Monique [name], female    DOB: Jun 08, 1986, 28 y.o.   MRN: [id number]  HPI   Monique [name] is back regarding her chronic pain and TBI. She had a fall last month while trying to get somehting out of her closet. She went to the ED at [facility] and work up was negative.  She continues on ritalin 5mg  bid as well as her pain medication from me. She feels that the ritalin helps with her focus and attention.   She has come down with a viral infection from her daughter. She is nauseas and feels as if she's going to vomit right now.     Pain Inventory Average Pain 7 Pain Right Now 6 My pain is constant  In the last 24 hours, has pain interfered with the following? General activity 8 Relation with others 8 Enjoyment of life 8 What TIME of day is your pain at its worst? morning, evening, night  Sleep (in general) Fair  Pain is worse with: walking, bending and some activites Pain improves with: rest, heat/ice and medication Relief from Meds: 3  Mobility walk without assistance how many minutes can you walk? 15 ability to climb steps?  yes do you drive?  no  Function not employed: date last employed .  Neuro/Psych bladder control problems  Prior Studies Any changes since last visit?  no  Physicians involved in your care Any changes since last visit?  yes   Family History  Problem Relation Age of Onset  . Cancer Paternal Grandmother     Ovarian   History   Social History  . Marital Status: Single    Spouse Name: N/A    Number of Children: N/A  . Years of Education: N/A   Social History Main Topics  . Smoking status: Passive Smoke Exposure - Never Smoker  . Smokeless tobacco: Never Used  . Alcohol Use: Yes     Comment: occasional  . Drug Use: No  . Sexual Activity: Yes   Other Topics Concern  . None   Social History Narrative   Past Surgical History  Procedure Laterality Date  . Orif ankle fracture Left 12/14/2012   Procedure: OPEN REDUCTION INTERNAL FIXATION (ORIF) ANKLE FRACTURE;  Surgeon: [name], MD;  Location: [place];  Service: Orthopedics;  Laterality: Left;  . I&d extremity Left 12/14/2012    Procedure: IRRIGATION AND DEBRIDEMENT EXTREMITY;  Surgeon: [name], MD;  Location: [place];  Service: Orthopedics;  Laterality: Left;  . External fixation leg Bilateral 12/14/2012    Procedure: EXTERNAL FIXATION LEG;  Surgeon: [name], MD;  Location: [place];  Service: Orthopedics;  Laterality: Bilateral;  . Appendectomy    . Ceasarean section  2007, 2010  . Orif tibia fracture Bilateral 12/23/2012    Procedure: OPEN REDUCTION INTERNAL FIXATION (ORIF) BILATERAL DISTAL TIBIA FRACTURE;  Surgeon: [name], MD;  Location: [place];  Service: Orthopedics;  Laterality: Bilateral;  . External fixation removal Bilateral 12/23/2012    Procedure: REMOVAL EXTERNAL FIXATION LEG;  Surgeon: [name], MD;  Location: [place];  Service: Orthopedics;  Laterality: Bilateral;   Past Medical History  Diagnosis Date  . Depression   . Anxiety   . Heart murmur   . Anemia    BP 122/49 mmHg  Pulse 87  Resp 14  Ht 5\' 5"  (1.651 m)  Wt 194 lb (87.998 kg)  BMI 32.28 kg/m2  SpO2 98%  Opioid Risk Score:  Fall Risk Score: Moderate Fall Risk (6-13 points) (pt says she has received the documentation)  Review of Systems  Genitourinary:       Bladder control problems  All other systems reviewed and are negative.      Objective:   Physical Exam   General: Alert and oriented x 3, sick appearing, occasionally dry heaving HEENT: Head is normocephalic, atraumatic, PERRLA, EOMI, sclera anicteric, oral mucosa pink and moist, dentition intact, ext ear canals clear,  Neck: Supple without JVD or lymphadenopathy  Heart: Reg rate and rhythm. No murmurs rubs or gallops  Chest: CTA bilaterally without wheezes, rales, or rhonchi; no distress  Abdomen: Soft, non-tender, non-distended, bowel sounds positive.    Extremities: No clubbing, cyanosis, notable edema as below. Pulses are 2+  Skin: Clean and intact without signs of breakdown  Neuro: Pt is cognitively appropriate with normal insight, and awareness. Cranial nerves 2-12 are intact. Sensory exam is normal except for the bases of the feet which are hypersensitive to touch and lack FT discrimination. Reflexes are 2+ in all 4's. UE motor 5/5. LE: 4/5 HF, KE, 2-3 at. Loses attention at times still..  Musculoskeletal: Callus noted at ankles, with associated swelling as well. Ankles were somewhat tender to palpation. ROM remains limited. She walks with little ADF and has to place the right/left leg   out in front with the knee fully extended to allow weight bearing through the right/left foot. Antalgia is noted on either leg. Sternum tender to touch, mid Body. mildy tenderness on soft tissue of neck, no limitations in cervical rom, no phonation/swallowing pain.  Psych: Pt's affect is   cooperative.   Assessment & Plan:   1. Pain control: Oxycodone was refilled 10mg  #60.  2. Bilateral pilon fractures  -continue prn Meloxicam, tylenol, ice/heat  -recommended high top shoes to help better support ankles -needs to be sensible about activities given her ortho and neurological injuries. 3. C2 fracture: healed  4. Nausea--sent home--needs fluids/recovery time 5. TBI: .  -continue ritalin 5mg  bid. Consider increase to 10mg  -prn klonopin for anxiety attacks.  -discussed better organizational routine/use of her smart phone, etc --family needs to adjust also 6. Follow up in 1 months time with NP. 15 minutes of face to face patient care time were spent during this visit. All questions were encouraged and answered.

## 2014-09-14 ENCOUNTER — Telehealth: Payer: Self-pay | Admitting: *Deleted

## 2014-09-14 NOTE — Telephone Encounter (Signed)
Pt had a form faxed over, needs to have Dr. Naaman Plummer fill it out, says he has done this before, needs to be faxed to [phone] by the end of the day tomorrow (09/15/2014)

## 2014-09-15 NOTE — Telephone Encounter (Signed)
Where is form?

## 2014-09-15 NOTE — Telephone Encounter (Signed)
Form was filled out and signed by Dr. Naaman Plummer, form was fax'd to requested location and copy was sent to pt

## 2014-09-23 ENCOUNTER — Encounter: Payer: Medicaid Other | Attending: Physical Medicine & Rehabilitation | Admitting: Registered Nurse

## 2014-09-23 ENCOUNTER — Encounter: Payer: Self-pay | Admitting: Registered Nurse

## 2014-09-23 ENCOUNTER — Other Ambulatory Visit: Payer: Self-pay | Admitting: Physical Medicine & Rehabilitation

## 2014-09-23 VITALS — BP 127/98 | HR 81 | Resp 16

## 2014-09-23 DIAGNOSIS — S069X5D Unspecified intracranial injury with loss of consciousness greater than 24 hours with return to pre-existing conscious level, subsequent encounter: Secondary | ICD-10-CM | POA: Diagnosis present

## 2014-09-23 DIAGNOSIS — G5740 Lesion of medial popliteal nerve, unspecified lower limb: Secondary | ICD-10-CM

## 2014-09-23 DIAGNOSIS — G589 Mononeuropathy, unspecified: Secondary | ICD-10-CM | POA: Insufficient documentation

## 2014-09-23 DIAGNOSIS — G894 Chronic pain syndrome: Secondary | ICD-10-CM

## 2014-09-23 DIAGNOSIS — S82891A Other fracture of right lower leg, initial encounter for closed fracture: Secondary | ICD-10-CM | POA: Diagnosis present

## 2014-09-23 DIAGNOSIS — S069X5A Unspecified intracranial injury with loss of consciousness greater than 24 hours with return to pre-existing conscious level, initial encounter: Secondary | ICD-10-CM | POA: Diagnosis present

## 2014-09-23 DIAGNOSIS — S82892A Other fracture of left lower leg, initial encounter for closed fracture: Secondary | ICD-10-CM | POA: Diagnosis present

## 2014-09-23 DIAGNOSIS — Z5181 Encounter for therapeutic drug level monitoring: Secondary | ICD-10-CM

## 2014-09-23 DIAGNOSIS — S12100S Unspecified displaced fracture of second cervical vertebra, sequela: Secondary | ICD-10-CM | POA: Diagnosis present

## 2014-09-23 DIAGNOSIS — Z79899 Other long term (current) drug therapy: Secondary | ICD-10-CM

## 2014-09-23 MED ORDER — METHYLPHENIDATE HCL 5 MG PO TABS: 5.0000 mg | ORAL_TABLET | Freq: Two times a day (BID) | ORAL | Status: DC

## 2014-09-23 MED ORDER — OXYCODONE HCL 10 MG PO TABS: 10.0000 mg | ORAL_TABLET | Freq: Two times a day (BID) | ORAL | Status: DC | PRN

## 2014-09-23 MED ORDER — METHYLPHENIDATE HCL 5 MG PO TABS
5.0000 mg | ORAL_TABLET | Freq: Two times a day (BID) | ORAL | Status: DC
Start: 2014-09-23 — End: 2014-11-24

## 2014-09-23 NOTE — Progress Notes (Signed)
Subjective:    Patient ID: Monique [name], female    DOB: September 11, 1986, 28 y.o.   MRN: [id number]  HPI: Monique [name] is a 28 year old female who returns for follow up for chronic pain and medication refill. She says her pain is located in her neck and right knee. She rates her pain 3. Her current exercise regime is walking.  Pain Inventory Average Pain 7 Pain Right Now 3 My pain is constant, sharp, stabbing, tingling and aching  In the last 24 hours, has pain interfered with the following? General activity 8 Relation with others 8 Enjoyment of life 8 What TIME of day is your pain at its worst? morning , evening and night Sleep (in general) Fair  Pain is worse with: walking, bending, standing and some activites Pain improves with: rest, heat/ice and medication Relief from Meds: 8  Mobility walk without assistance ability to climb steps?  yes do you drive?  no  Function disabled: date disabled .  Neuro/Psych No problems in this area  Prior Studies Any changes since last visit?  no  Physicians involved in your care Any changes since last visit?  no   Family History  Problem Relation Age of Onset  . Cancer Paternal Grandmother     Ovarian   History   Social History  . Marital Status: Single    Spouse Name: N/A    Number of Children: N/A  . Years of Education: N/A   Social History Main Topics  . Smoking status: Passive Smoke Exposure - Never Smoker  . Smokeless tobacco: Never Used  . Alcohol Use: Yes     Comment: occasional  . Drug Use: No  . Sexual Activity: Yes   Other Topics Concern  . None   Social History Narrative   Past Surgical History  Procedure Laterality Date  . Orif ankle fracture Left 12/14/2012    Procedure: OPEN REDUCTION INTERNAL FIXATION (ORIF) ANKLE FRACTURE;  Surgeon: [name], MD;  Location: [place];  Service: Orthopedics;  Laterality: Left;  . I&d extremity Left 12/14/2012    Procedure: IRRIGATION AND DEBRIDEMENT  EXTREMITY;  Surgeon: [name], MD;  Location: [place];  Service: Orthopedics;  Laterality: Left;  . External fixation leg Bilateral 12/14/2012    Procedure: EXTERNAL FIXATION LEG;  Surgeon: [name], MD;  Location: [place];  Service: Orthopedics;  Laterality: Bilateral;  . Appendectomy    . Ceasarean section  2007, 2010  . Orif tibia fracture Bilateral 12/23/2012    Procedure: OPEN REDUCTION INTERNAL FIXATION (ORIF) BILATERAL DISTAL TIBIA FRACTURE;  Surgeon: [name], MD;  Location: [place];  Service: Orthopedics;  Laterality: Bilateral;  . External fixation removal Bilateral 12/23/2012    Procedure: REMOVAL EXTERNAL FIXATION LEG;  Surgeon: [name], MD;  Location: [place];  Service: Orthopedics;  Laterality: Bilateral;   Past Medical History  Diagnosis Date  . Depression   . Anxiety   . Heart murmur   . Anemia    BP 127/98 mmHg  Pulse 81  Resp 16  SpO2 100%  Opioid Risk Score:   Fall Risk Score:    Review of Systems  Constitutional: Negative.   HENT: Negative.   Eyes: Negative.   Respiratory: Negative.   Cardiovascular: Negative.   Gastrointestinal: Negative.   Endocrine: Negative.   Genitourinary: Negative.   Musculoskeletal: Positive for back pain.  Skin: Negative.   Allergic/Immunologic: Negative.   Neurological: Negative.   Hematological: Negative.  Psychiatric/Behavioral: Negative.        Objective:   Physical Exam  Constitutional: She is oriented to person, place, and time. She appears well-developed and well-nourished.  HENT:  Head: Normocephalic and atraumatic.  Neck: Normal range of motion. Neck supple.  Cardiovascular: Normal rate and regular rhythm.   Pulmonary/Chest: Effort normal and breath sounds normal.  Musculoskeletal:  Normal Muscle bulk and Muscle testing reveals: Upper extremities: Full ROM and Muscle strength 5/5 Lower extremities: Full ROM and Muscle strength 5/5 Bilateral Lower Extremity Flexion Produces pain into  Posteriorly Lower extremities. Arises from chair with ease Narrow Based Gait  Neurological: She is alert and oriented to person, place, and time.  Skin: Skin is warm and dry.  Psychiatric: She has a normal mood and affect.  Nursing note and vitals reviewed.         Assessment & Plan:  1. [id number] to Monitor/More Focused: Continue Ritalin 5 mg ont tablet twice a day #60. Second script given  2. Bilateral Pilon Fractures: Continue Zanaflex and Mobic  3. Panic Attacks: Continue Klonopin  4. Tibial Nerve Injuries: Refilled: OXYcodone 10 mg 1 tablet twice a day as needed. #60. Second script given.  20 minutes of face to face patient care time was spent during this visit. All questions were encouraged and answered.   F/U Visit in 2 month

## 2014-09-24 LAB — PMP ALCOHOL METABOLITE (ETG)

## 2014-09-28 LAB — PRESCRIPTION MONITORING PROFILE (SOLSTAS)
Amphetamine/Meth: NEGATIVE ng/mL
BENZODIAZEPINE SCREEN, URINE: NEGATIVE ng/mL
Barbiturate Screen, Urine: NEGATIVE ng/mL
Buprenorphine, Urine: NEGATIVE ng/mL
CREATININE, URINE: 151.34 mg/dL (ref >20.0)
Cannabinoid Scrn, Ur: NEGATIVE ng/mL
Carisoprodol, Urine: NEGATIVE ng/mL
Cocaine Metabolites: NEGATIVE ng/mL
ECSTASY: NEGATIVE ng/mL
Fentanyl, Ur: NEGATIVE ng/mL
METHADONE SCREEN, URINE: NEGATIVE ng/mL
Meperidine, Ur: NEGATIVE ng/mL
NITRITES URINE, INITIAL: NEGATIVE ug/mL
Opiate Screen, Urine: NEGATIVE ng/mL
Propoxyphene: NEGATIVE ng/mL
TRAMADOL UR: NEGATIVE ng/mL
Tapentadol, urine: NEGATIVE ng/mL
Zolpidem, Urine: NEGATIVE ng/mL
pH, Initial: 7.1 pH (ref 4.5–8.9)

## 2014-09-28 LAB — OXYCODONE, URINE (LC/MS-MS)
Noroxycodone, Ur: 290 ng/mL (ref <50)
OXYMORPHONE, URINE: 426 ng/mL (ref <50)
Oxycodone, ur: 149 ng/mL (ref <50)

## 2014-09-28 LAB — ETHYL GLUCURONIDE, URINE
ETHYL SULFATE (ETS): 618 ng/mL — AB (ref <100)
Ethyl Glucuronide (EtG): NEGATIVE ng/mL (ref <500)

## 2014-10-13 NOTE — Progress Notes (Signed)
Urine drug screen for this encounter is consistent for prescribed medication 

## 2014-11-24 ENCOUNTER — Encounter: Payer: Medicaid Other | Attending: Physical Medicine & Rehabilitation | Admitting: Physical Medicine & Rehabilitation

## 2014-11-24 ENCOUNTER — Encounter: Payer: Self-pay | Admitting: Physical Medicine & Rehabilitation

## 2014-11-24 VITALS — BP 108/60 | HR 68 | Resp 14

## 2014-11-24 DIAGNOSIS — S82892A Other fracture of left lower leg, initial encounter for closed fracture: Secondary | ICD-10-CM | POA: Diagnosis present

## 2014-11-24 DIAGNOSIS — S82891A Other fracture of right lower leg, initial encounter for closed fracture: Secondary | ICD-10-CM | POA: Diagnosis present

## 2014-11-24 DIAGNOSIS — S12100S Unspecified displaced fracture of second cervical vertebra, sequela: Secondary | ICD-10-CM

## 2014-11-24 DIAGNOSIS — S069X5A Unspecified intracranial injury with loss of consciousness greater than 24 hours with return to pre-existing conscious level, initial encounter: Secondary | ICD-10-CM

## 2014-11-24 DIAGNOSIS — F068 Other specified mental disorders due to known physiological condition: Secondary | ICD-10-CM

## 2014-11-24 DIAGNOSIS — S069X0S Unspecified intracranial injury without loss of consciousness, sequela: Secondary | ICD-10-CM

## 2014-11-24 DIAGNOSIS — R51 Headache: Secondary | ICD-10-CM

## 2014-11-24 DIAGNOSIS — R519 Headache, unspecified: Secondary | ICD-10-CM

## 2014-11-24 DIAGNOSIS — G589 Mononeuropathy, unspecified: Secondary | ICD-10-CM | POA: Diagnosis present

## 2014-11-24 DIAGNOSIS — S069X5D Unspecified intracranial injury with loss of consciousness greater than 24 hours with return to pre-existing conscious level, subsequent encounter: Secondary | ICD-10-CM | POA: Diagnosis present

## 2014-11-24 DIAGNOSIS — G5740 Lesion of medial popliteal nerve, unspecified lower limb: Secondary | ICD-10-CM

## 2014-11-24 MED ORDER — TOPIRAMATE 25 MG PO TABS: 25.0000 mg | ORAL_TABLET | Freq: Every day | ORAL | Status: DC

## 2014-11-24 MED ORDER — OXYCODONE HCL 10 MG PO TABS: 10.0000 mg | ORAL_TABLET | Freq: Two times a day (BID) | ORAL | Status: DC | PRN

## 2014-11-24 MED ORDER — METHYLPHENIDATE HCL 5 MG PO TABS: 5.0000 mg | ORAL_TABLET | Freq: Two times a day (BID) | ORAL | Status: DC

## 2014-11-24 NOTE — Progress Notes (Signed)
Subjective:    Patient ID: Monique [name], female    DOB: Jan 24, 1986, 29 y.o.   MRN: [id number]  HPI   She is having increased headaches. She is calling them migraines. She's had them since child hood. She has blurred visual aura prior to them taking place. Headaches are happening once per month.  She is reporting ongoing issues with her visual acuity---hasn't had her eyes checked. She does report history of environmental and pet allergies.  Headaches are typically over the front of her head. Neck sometimes may be a generator.   Sleep remains an issue. She sleeps for up to six hours at most. Pain is often an issue at night.   Anxiety is still an issue---she notices it most when she's in the car.     Pain Inventory Average Pain 8 Pain Right Now 4 My pain is constant, sharp, stabbing, tingling and aching  In the last 24 hours, has pain interfered with the following? General activity 9 Relation with others 9 Enjoyment of life 9 What TIME of day is your pain at its worst? evening and night  Sleep (in general) Poor  Pain is worse with: walking, bending and some activites Pain improves with: rest, heat/ice and medication Relief from Meds: 8  Mobility walk without assistance how many minutes can you walk? 15-20 ability to climb steps?  yes do you drive?  no  Function disabled: date disabled .  Neuro/Psych tingling spasms confusion depression anxiety  Prior Studies Any changes since last visit?  no  Physicians involved in your care Any changes since last visit?  no   Family History  Problem Relation Age of Onset  . Cancer Paternal Grandmother     Ovarian   History   Social History  . Marital Status: Single    Spouse Name: N/A  . Number of Children: N/A  . Years of Education: N/A   Social History Main Topics  . Smoking status: Passive Smoke Exposure - Never Smoker  . Smokeless tobacco: Never Used  . Alcohol Use: Yes     Comment: occasional  . Drug  Use: No  . Sexual Activity: Yes   Other Topics Concern  . None   Social History Narrative   Past Surgical History  Procedure Laterality Date  . Orif ankle fracture Left 12/14/2012    Procedure: OPEN REDUCTION INTERNAL FIXATION (ORIF) ANKLE FRACTURE;  Surgeon: [name], MD;  Location: [place];  Service: Orthopedics;  Laterality: Left;  . I&d extremity Left 12/14/2012    Procedure: IRRIGATION AND DEBRIDEMENT EXTREMITY;  Surgeon: [name], MD;  Location: [place];  Service: Orthopedics;  Laterality: Left;  . External fixation leg Bilateral 12/14/2012    Procedure: EXTERNAL FIXATION LEG;  Surgeon: [name], MD;  Location: [place];  Service: Orthopedics;  Laterality: Bilateral;  . Appendectomy    . Ceasarean section  2007, 2010  . Orif tibia fracture Bilateral 12/23/2012    Procedure: OPEN REDUCTION INTERNAL FIXATION (ORIF) BILATERAL DISTAL TIBIA FRACTURE;  Surgeon: [name], MD;  Location: [place];  Service: Orthopedics;  Laterality: Bilateral;  . External fixation removal Bilateral 12/23/2012    Procedure: REMOVAL EXTERNAL FIXATION LEG;  Surgeon: [name], MD;  Location: [place];  Service: Orthopedics;  Laterality: Bilateral;   Past Medical History  Diagnosis Date  . Depression   . Anxiety   . Heart murmur   . Anemia    BP 108/60 mmHg  Pulse 68  Resp  14  SpO2 100%  Opioid Risk Score:   Fall Risk Score: Moderate Fall Risk (6-13 points)  Review of Systems  Constitutional: Negative.   HENT: Negative.   Eyes: Negative.   Respiratory: Negative.   Cardiovascular: Negative.   Gastrointestinal: Negative.   Endocrine: Negative.   Genitourinary: Negative.   Musculoskeletal: Positive for myalgias, back pain and arthralgias.  Skin: Negative.   Allergic/Immunologic: Negative.   Neurological:       Tingling, spasms  Hematological: Negative.   Psychiatric/Behavioral: Positive for confusion and dysphoric mood. The patient is nervous/anxious.        Objective:    Physical Exam  General: Alert and oriented x 3, sick appearing, occasionally dry heaving  HEENT: Head is normocephalic, atraumatic, PERRLA, EOMI, sclera anicteric, oral mucosa pink and moist, dentition intact, ext ear canals clear,  Neck: Supple without JVD or lymphadenopathy  Heart: Reg rate and rhythm. No murmurs rubs or gallops  Chest: CTA bilaterally without wheezes, rales, or rhonchi; no distress  Abdomen: Soft, non-tender, non-distended, bowel sounds positive.  Extremities: No clubbing, cyanosis, notable edema as below. Pulses are 2+  Skin: Clean and intact without signs of breakdown  Neuro: Pt has normal insight, and awareness. Cranial nerves 2-12 are intact. Sensory exam is normal except for the bases of the feet which are hypersensitive to touch and lack FT discrimination. [name] attention at times still. Decreased visual acuity from afar.  Musculoskeletal: PRESERVED cervical rom in all planes. Scalp slightly sensitive to touch.  Sternum tender to touch, mid Body. mildy tenderness on soft tissue of neck, no limitations in cervical rom, no phonation/swallowing pain.  Psych: Pt's affect is cooperative.   Assessment & Plan:   1. Pain control: Oxycodone was refilled 10mg  #60.  2. Bilateral pilon fractures, C2 fractures -continue prn Meloxicam, tylenol, ice/heat  - high top shoes to help better support ankles  -needs to be sensible about activities given her ortho and neurological injuries.  3. TBI: .  -continue ritalin 5mg  bid---would continue for now.   -prn klonopin for anxiety attacks.  -discussed better organizational routine/use of her smart phone, etc --family needs to adjust also  6. Headaches---post traumatic vs migraine vs tension:   -add low dose topamax to help sleep and to assist with neuro pain in legs  -asked that she see have her eyes examined---needs glasses  -appropriate sleeping posture, position 7. Follow up in 1 months time with NP. 15 minutes of face to face  patient care time were spent during this visit. All questions were encouraged and answered.

## 2014-11-24 NOTE — Patient Instructions (Signed)
NEED TO WORK ON A "NEUTRAL" HEAD POSITION WHEN YOU SLEEP  HAVE YOUR EYES CHECKED.   TREAT ALLERGIES IF A PROBLEM

## 2014-12-13 ENCOUNTER — Telehealth: Payer: Self-pay | Admitting: *Deleted

## 2014-12-13 NOTE — Telephone Encounter (Signed)
Is she wanting a botox injection?  Is she using the topamax? If so how much---still the 25mg ?  If so, I would increase to 50mg  hs.

## 2014-12-13 NOTE — Telephone Encounter (Signed)
Patient called and left message stating that she went to the ED with a migraine over the weekend.  Her migraines are getting worse and more frequent.  She is asking for an injection.  Please advise

## 2014-12-14 NOTE — Telephone Encounter (Signed)
I attempted to reach Monique [name] but the phone number left with original message says it is not in service.  I called the home number listed but the voicemail says "[name]".  I left message stating we were trying to reach [facility] and if this person is related to ask her to call Dr Naaman Plummer office otherwise disregard.  There was not much choice in how to reach her.

## 2014-12-15 NOTE — Telephone Encounter (Signed)
2nd attempt to reach [facility] and she has not called back from messages left.  She has an ppt 12/24/14 with Zella Ball and it can be discussed at that time and this message will be closed.

## 2014-12-24 ENCOUNTER — Encounter: Payer: Self-pay | Admitting: Registered Nurse

## 2014-12-24 ENCOUNTER — Other Ambulatory Visit: Payer: Self-pay | Admitting: Registered Nurse

## 2014-12-24 ENCOUNTER — Encounter: Payer: Medicaid Other | Attending: Physical Medicine & Rehabilitation | Admitting: Registered Nurse

## 2014-12-24 DIAGNOSIS — S069X5A Unspecified intracranial injury with loss of consciousness greater than 24 hours with return to pre-existing conscious level, initial encounter: Secondary | ICD-10-CM | POA: Diagnosis present

## 2014-12-24 DIAGNOSIS — F32A Depression, unspecified: Secondary | ICD-10-CM

## 2014-12-24 DIAGNOSIS — S069X0S Unspecified intracranial injury without loss of consciousness, sequela: Secondary | ICD-10-CM

## 2014-12-24 DIAGNOSIS — S82892A Other fracture of left lower leg, initial encounter for closed fracture: Secondary | ICD-10-CM | POA: Diagnosis present

## 2014-12-24 DIAGNOSIS — R4189 Other symptoms and signs involving cognitive functions and awareness: Secondary | ICD-10-CM

## 2014-12-24 DIAGNOSIS — Z79899 Other long term (current) drug therapy: Secondary | ICD-10-CM | POA: Diagnosis not present

## 2014-12-24 DIAGNOSIS — S82891A Other fracture of right lower leg, initial encounter for closed fracture: Secondary | ICD-10-CM

## 2014-12-24 DIAGNOSIS — G589 Mononeuropathy, unspecified: Secondary | ICD-10-CM

## 2014-12-24 DIAGNOSIS — S12100S Unspecified displaced fracture of second cervical vertebra, sequela: Secondary | ICD-10-CM | POA: Diagnosis present

## 2014-12-24 DIAGNOSIS — F068 Other specified mental disorders due to known physiological condition: Secondary | ICD-10-CM

## 2014-12-24 DIAGNOSIS — S069X5D Unspecified intracranial injury with loss of consciousness greater than 24 hours with return to pre-existing conscious level, subsequent encounter: Secondary | ICD-10-CM | POA: Diagnosis present

## 2014-12-24 DIAGNOSIS — G5740 Lesion of medial popliteal nerve, unspecified lower limb: Secondary | ICD-10-CM

## 2014-12-24 DIAGNOSIS — Z5181 Encounter for therapeutic drug level monitoring: Secondary | ICD-10-CM

## 2014-12-24 DIAGNOSIS — F4312 Post-traumatic stress disorder, chronic: Secondary | ICD-10-CM

## 2014-12-24 DIAGNOSIS — G894 Chronic pain syndrome: Secondary | ICD-10-CM

## 2014-12-24 DIAGNOSIS — F329 Major depressive disorder, single episode, unspecified: Secondary | ICD-10-CM

## 2014-12-24 MED ORDER — METHYLPHENIDATE HCL 5 MG PO TABS: 5.0000 mg | ORAL_TABLET | Freq: Two times a day (BID) | ORAL | Status: DC

## 2014-12-24 MED ORDER — OXYCODONE HCL 10 MG PO TABS: 10.0000 mg | ORAL_TABLET | Freq: Two times a day (BID) | ORAL | Status: DC | PRN

## 2014-12-24 MED ORDER — METHYLPHENIDATE HCL 5 MG PO TABS
5.0000 mg | ORAL_TABLET | Freq: Two times a day (BID) | ORAL | Status: DC
Start: 2014-12-24 — End: 2014-12-24

## 2014-12-24 NOTE — Progress Notes (Signed)
Subjective:    Patient ID: Monique [name], female    DOB: 1985-12-07, 29 y.o.   MRN: [id number]  HPI: [name] is a 29 year old female who returns for follow up for chronic pain and medication refill. She says her pain is located in her upper back and bilateral feet. Also having migraines. Last month she was started on a low dose topamax she had adverse effects of nausea and vomiting. This medication has been discontinued.  Will speak with Dr. Naaman Plummer regrading Imitrex injection she was on this in the past she verbalizes understanding. She rates her pain 4. Her current exercise regime is walking. Her PHQ-9 score 20, referred to psychology for counseling. She admits to being overwhelmed and stressesd out. Denies depression or suicidal ideation or plan.  Pain Inventory Average Pain 8 Pain Right Now 4 My pain is constant, sharp, stabbing, tingling and aching  In the last 24 hours, has pain interfered with the following? General activity 9 Relation with others 9 Enjoyment of life 9 What TIME of day is your pain at its worst? evening and night Sleep (in general) Fair  Pain is worse with: walking, bending and some activites Pain improves with: medication Relief from Meds: 8  Mobility walk without assistance ability to climb steps?  yes  Function disabled: date disabled .  Neuro/Psych weakness spasms dizziness confusion depression  Prior Studies Any changes since last visit?  no  Physicians involved in your care Any changes since last visit?  no   Family History  Problem Relation Age of Onset  . Cancer Paternal Grandmother     Ovarian   History   Social History  . Marital Status: Single    Spouse Name: N/A  . Number of Children: N/A  . Years of Education: N/A   Social History Main Topics  . Smoking status: Passive Smoke Exposure - Never Smoker  . Smokeless tobacco: Never Used  . Alcohol Use: Yes     Comment: occasional  . Drug Use: No  .  Sexual Activity: Yes   Other Topics Concern  . None   Social History Narrative   Past Surgical History  Procedure Laterality Date  . Orif ankle fracture Left 12/14/2012    Procedure: OPEN REDUCTION INTERNAL FIXATION (ORIF) ANKLE FRACTURE;  Surgeon: [name], MD;  Location: [place];  Service: Orthopedics;  Laterality: Left;  . I&d extremity Left 12/14/2012    Procedure: IRRIGATION AND DEBRIDEMENT EXTREMITY;  Surgeon: [name], MD;  Location: [place];  Service: Orthopedics;  Laterality: Left;  . External fixation leg Bilateral 12/14/2012    Procedure: EXTERNAL FIXATION LEG;  Surgeon: [name], MD;  Location: [place];  Service: Orthopedics;  Laterality: Bilateral;  . Appendectomy    . Ceasarean section  2007, 2010  . Orif tibia fracture Bilateral 12/23/2012    Procedure: OPEN REDUCTION INTERNAL FIXATION (ORIF) BILATERAL DISTAL TIBIA FRACTURE;  Surgeon: [name], MD;  Location: [place];  Service: Orthopedics;  Laterality: Bilateral;  . External fixation removal Bilateral 12/23/2012    Procedure: REMOVAL EXTERNAL FIXATION LEG;  Surgeon: [name], MD;  Location: [place];  Service: Orthopedics;  Laterality: Bilateral;   Past Medical History  Diagnosis Date  . Depression   . Anxiety   . Heart murmur   . Anemia    There were no vitals taken for this visit.  Opioid Risk Score:   Fall Risk Score: Moderate Fall Risk (6-13 points) (previously educated  and given handout)`1  Depression screen PHQ 2/9  Depression screen PHQ 2/9 12/24/2014  Decreased Interest 2  Down, Depressed, Hopeless 2  PHQ - 2 Score 4  Altered sleeping 3  Tired, decreased energy 3  Change in appetite 2  Feeling bad or failure about yourself  2  Trouble concentrating 3  Moving slowly or fidgety/restless 3  Suicidal thoughts 0  PHQ-9 Score 20     Review of Systems  Constitutional: Positive for diaphoresis and unexpected weight change.  Musculoskeletal:       Spasms  Neurological: Positive for  dizziness.  Psychiatric/Behavioral: Positive for confusion. The patient is nervous/anxious.   All other systems reviewed and are negative.      Objective:   Physical Exam  Constitutional: She is oriented to person, place, and time. She appears well-developed and well-nourished.  HENT:  Head: Normocephalic and atraumatic.  Neck: Normal range of motion. Neck supple.  Cardiovascular: Normal rate and regular rhythm.   Pulmonary/Chest: Effort normal and breath sounds normal.  Musculoskeletal:  Normal Muscle Bulk and Muscle Testing Reveals: Upper Extremities: Full ROM and Muscle Strength 5/5 Back without spinal or paraspinal tenderness Lower Extremities: Full ROM and Muscle strength 5/5 Bilateral lower extremity flexion produces pain into achilles and anterior aspect of feet. Arises from chair with ease Narrow Based gait  Neurological: She is alert and oriented to person, place, and time.  Skin: Skin is warm and dry.  Psychiatric: She has a normal mood and affect.  Nursing note and vitals reviewed.         Assessment & Plan:  1. AES:LPNPYYFR to Monitor/More Focused: Continue Ritalin 5 mg ont tablet twice a day #60. Second script given  2. Bilateral Pilon Fractures: Continue Zanaflex and Mobic  3. Panic Attacks: Continue Klonopin RX: Referral to Counseling. 4. Tibial Nerve Injuries: Refilled: OXYcodone 10 mg 1 tablet twice a day as needed. #60. Second script given. 5. Post traumatic Stress disorder: Referral to counseling. 20 minutes of face to face patient care time was spent during this visit. All questions were encouraged and answered.   F/U Visit in 2 month

## 2014-12-25 LAB — PMP ALCOHOL METABOLITE (ETG): Ethyl Glucuronide (EtG): NEGATIVE ng/mL

## 2014-12-28 LAB — OPIATES/OPIOIDS (LC/MS-MS)
Codeine Urine: NEGATIVE ng/mL (ref <50)
Hydrocodone: NEGATIVE ng/mL (ref <50)
Hydromorphone: NEGATIVE ng/mL (ref <50)
Morphine Urine: NEGATIVE ng/mL (ref <50)
NORHYDROCODONE, UR: NEGATIVE ng/mL (ref <50)
NOROXYCODONE, UR: 2574 ng/mL (ref <50)
OXYCODONE, UR: 3020 ng/mL (ref <50)
Oxymorphone: 1312 ng/mL (ref <50)

## 2014-12-28 LAB — OXYCODONE, URINE (LC/MS-MS)
NOROXYCODONE, UR: 2574 ng/mL (ref <50)
OXYCODONE, UR: 3020 ng/mL (ref <50)
Oxymorphone: 1312 ng/mL (ref <50)

## 2014-12-29 LAB — PRESCRIPTION MONITORING PROFILE (SOLSTAS)
Amphetamine/Meth: NEGATIVE ng/mL
Barbiturate Screen, Urine: NEGATIVE ng/mL
Benzodiazepine Screen, Urine: NEGATIVE ng/mL
Buprenorphine, Urine: NEGATIVE ng/mL
CANNABINOID SCRN UR: NEGATIVE ng/mL
CARISOPRODOL, URINE: NEGATIVE ng/mL
Cocaine Metabolites: NEGATIVE ng/mL
Creatinine, Urine: 241.79 mg/dL (ref >20.0)
ECSTASY: NEGATIVE ng/mL
Fentanyl, Ur: NEGATIVE ng/mL
MEPERIDINE UR: NEGATIVE ng/mL
METHADONE SCREEN, URINE: NEGATIVE ng/mL
Nitrites, Initial: NEGATIVE ug/mL
PH URINE, INITIAL: 5.8 pH (ref 4.5–8.9)
Propoxyphene: NEGATIVE ng/mL
TRAMADOL UR: NEGATIVE ng/mL
Tapentadol, urine: NEGATIVE ng/mL
Zolpidem, Urine: NEGATIVE ng/mL

## 2014-12-31 LAB — METHYLPHENIDATE METAB, QUANT, U: RITALINIC ACID: 13855 ng/mL — AB (ref <100)

## 2015-01-04 ENCOUNTER — Telehealth: Payer: Self-pay | Admitting: Registered Nurse

## 2015-01-04 MED ORDER — DIVALPROEX SODIUM 250 MG PO DR TAB: 250.0000 mg | DELAYED_RELEASE_TABLET | Freq: Two times a day (BID) | ORAL | Status: DC

## 2015-01-04 NOTE — Telephone Encounter (Signed)
Spoke with Dr. Naaman Plummer regarding Monique [name] migraines. He wants her to see her opthalmologist, as requested in February. Aso she can start Depakote 250 mg BID

## 2015-01-04 NOTE — Telephone Encounter (Signed)
Called patient and told her that Dr. Naaman Plummer will RX Depakote 250 mg BID  #30 RF# 0.  Called in to [place]

## 2015-01-13 NOTE — Progress Notes (Signed)
Urine drug screen for this encounter is consistent for prescribed medication 

## 2015-02-21 ENCOUNTER — Encounter: Payer: Medicaid Other | Attending: Physical Medicine & Rehabilitation | Admitting: Physical Medicine & Rehabilitation

## 2015-02-21 ENCOUNTER — Encounter: Payer: Self-pay | Admitting: Physical Medicine & Rehabilitation

## 2015-02-21 VITALS — BP 127/67 | HR 109 | Resp 14

## 2015-02-21 DIAGNOSIS — S82892A Other fracture of left lower leg, initial encounter for closed fracture: Secondary | ICD-10-CM | POA: Insufficient documentation

## 2015-02-21 DIAGNOSIS — G5740 Lesion of medial popliteal nerve, unspecified lower limb: Secondary | ICD-10-CM

## 2015-02-21 DIAGNOSIS — S069X5A Unspecified intracranial injury with loss of consciousness greater than 24 hours with return to pre-existing conscious level, initial encounter: Secondary | ICD-10-CM | POA: Diagnosis present

## 2015-02-21 DIAGNOSIS — S82891A Other fracture of right lower leg, initial encounter for closed fracture: Secondary | ICD-10-CM

## 2015-02-21 DIAGNOSIS — G589 Mononeuropathy, unspecified: Secondary | ICD-10-CM | POA: Insufficient documentation

## 2015-02-21 DIAGNOSIS — S12100S Unspecified displaced fracture of second cervical vertebra, sequela: Secondary | ICD-10-CM | POA: Diagnosis not present

## 2015-02-21 DIAGNOSIS — S069X5D Unspecified intracranial injury with loss of consciousness greater than 24 hours with return to pre-existing conscious level, subsequent encounter: Secondary | ICD-10-CM | POA: Diagnosis present

## 2015-02-21 DIAGNOSIS — S069X5S Unspecified intracranial injury with loss of consciousness greater than 24 hours with return to pre-existing conscious level, sequela: Secondary | ICD-10-CM | POA: Diagnosis not present

## 2015-02-21 MED ORDER — METHYLPHENIDATE HCL 10 MG PO TABS: 10.0000 mg | ORAL_TABLET | Freq: Two times a day (BID) | ORAL | Status: DC

## 2015-02-21 MED ORDER — OXYCODONE HCL 10 MG PO TABS: 10.0000 mg | ORAL_TABLET | Freq: Two times a day (BID) | ORAL | Status: DC | PRN

## 2015-02-21 NOTE — Patient Instructions (Signed)
YOU NEED TO CREATE A ROUTINE AND ADD STRUCTURE TO YOUR LIFE.  I MADE THE REFERRAL TO OUTPT SPEECH THERAPY SO THAT YOU CAN BUILD A  STRUCTURE WHICH WILL WORK AT HOME TO MANAGE FAMILY, PERSONAL, FINANCIAL ISSUES, ETC.  I WOULD BE HAPPY TO SPEAK WITH YOUR FAMILY WHENEVER YOU WOULD LIKE TO DISCUSS THE MEMORY AND ATTENTION ISSUES RELATED TO YOUR BRAIN INJURY

## 2015-02-21 NOTE — Progress Notes (Signed)
A second prescription was printed for each ritalin and oxycodone because we are unable to get her back in to see a provider before the Rx will be depleted. She will return on 03/28/15 and will have pills in each bottle to count.

## 2015-02-21 NOTE — Progress Notes (Signed)
Subjective:    Patient ID: Monique [name], female    DOB: Apr 24, 1986, 29 y.o.   MRN: [id number]  HPI Monique [name] is here in follow up of her TBI and chronic pain. She was involved with some counseling but her counselor left and now she needs to find someone new. She is not taking her ritalin regularly. She continues to struggle with her management of every day activities. She has  5 and 24 yo children and feels that nobody really understands what's going on with her. She does not use a memory aid or organizer consistently. She does not have a routine  She denies depression but states that everything is stressing her out right now. The increased stress only adds to her cognitive issues. headahces are typically worse when stress is high.  Sleep is poor currently, inconsistent---she typically gets 6 hours per night.     Pain Inventory Average Pain 9 Pain Right Now 5 My pain is constant, sharp, stabbing and aching  In the last 24 hours, has pain interfered with the following? General activity 9 Relation with others 9 Enjoyment of life 9 What TIME of day is your pain at its worst? morning, evening, night Sleep (in general) Poor  Pain is worse with: walking, bending and some activites Pain improves with: rest, heat/ice and medication Relief from Meds: 8  Mobility walk without assistance ability to climb steps?  yes do you drive?  no  Function not employed: date last employed . disabled: date disabled . Do you have any goals in this area?  yes  Neuro/Psych spasms confusion depression  Prior Studies Any changes since last visit?  no  Physicians involved in your care Any changes since last visit?  no   Family History  Problem Relation Age of Onset  . Cancer Paternal Grandmother     Ovarian   History   Social History  . Marital Status: Single    Spouse Name: N/A  . Number of Children: N/A  . Years of Education: N/A   Social History Main Topics  . Smoking  status: Passive Smoke Exposure - Never Smoker  . Smokeless tobacco: Never Used  . Alcohol Use: Yes     Comment: occasional  . Drug Use: No  . Sexual Activity: Yes   Other Topics Concern  . None   Social History Narrative   Past Surgical History  Procedure Laterality Date  . Orif ankle fracture Left 12/14/2012    Procedure: OPEN REDUCTION INTERNAL FIXATION (ORIF) ANKLE FRACTURE;  Surgeon: [name], MD;  Location: [place];  Service: Orthopedics;  Laterality: Left;  . I&d extremity Left 12/14/2012    Procedure: IRRIGATION AND DEBRIDEMENT EXTREMITY;  Surgeon: [name], MD;  Location: [place];  Service: Orthopedics;  Laterality: Left;  . External fixation leg Bilateral 12/14/2012    Procedure: EXTERNAL FIXATION LEG;  Surgeon: [name], MD;  Location: [place];  Service: Orthopedics;  Laterality: Bilateral;  . Appendectomy    . Ceasarean section  2007, 2010  . Orif tibia fracture Bilateral 12/23/2012    Procedure: OPEN REDUCTION INTERNAL FIXATION (ORIF) BILATERAL DISTAL TIBIA FRACTURE;  Surgeon: [name], MD;  Location: [place];  Service: Orthopedics;  Laterality: Bilateral;  . External fixation removal Bilateral 12/23/2012    Procedure: REMOVAL EXTERNAL FIXATION LEG;  Surgeon: [name], MD;  Location: [place];  Service: Orthopedics;  Laterality: Bilateral;   Past Medical History  Diagnosis Date  . Depression   .  Anxiety   . Heart murmur   . Anemia    BP 127/67 mmHg  Pulse 109  Resp 14  SpO2 100%  Opioid Risk Score:   Fall Risk Score: Moderate Fall Risk (6-13 points)`1  Depression screen PHQ 2/9  Depression screen PHQ 2/9 12/24/2014  Decreased Interest 2  Down, Depressed, Hopeless 2  PHQ - 2 Score 4  Altered sleeping 3  Tired, decreased energy 3  Change in appetite 2  Feeling bad or failure about yourself  2  Trouble concentrating 3  Moving slowly or fidgety/restless 3  Suicidal thoughts 0  PHQ-9 Score 20     Review of Systems  Gastrointestinal:  Positive for abdominal pain and diarrhea.  Neurological:       Spasms  Psychiatric/Behavioral: Positive for confusion and dysphoric mood.  All other systems reviewed and are negative.      Objective:   Physical Exam   General: Alert and oriented x 3, sick appearing, occasionally dry heaving  HEENT: Head is normocephalic, atraumatic, PERRLA, EOMI, sclera anicteric, oral mucosa pink and moist, dentition intact, ext ear canals clear,  Neck: Supple without JVD or lymphadenopathy  Heart: Reg rate and rhythm. No murmurs rubs or gallops  Chest: CTA bilaterally without wheezes, rales, or rhonchi; no distress  Abdomen: Soft, non-tender, non-distended, bowel sounds positive.  Extremities: No clubbing, cyanosis, notable edema as below. Pulses are 2+  Skin: Clean and intact without signs of breakdown  Neuro: Pt has normal insight, and awareness. Cranial nerves 2-12 are intact. Sensory exam is normal except for the bases of the feet which are hypersensitive to touch and lack FT discrimination. Monique [name] attention at times still. Decreased visual acuity from afar.  Musculoskeletal: PRESERVED cervical rom in all planes. Scalp slightly sensitive to touch. Sternum tender to touch, mid Body. mildy tenderness on soft tissue of neck, no limitations in cervical rom, no phonation/swallowing pain.  Psych: Pt's affect is cooperative. Anxious, frequently tearful today   Assessment & Plan:   1. Pain control: Oxycodone was refilled 10mg  #60.  2. Bilateral pilon fractures, C2 fractures  -continue prn Meloxicam, tylenol, ice/heat   -needs to be sensible about activities given her ortho and neurological injuries.  3. TBI:   -increased ritalin 10mg  bid.  -prn klonopin for anxiety attacks.  -discussed better organizational routine/use of her smart phone, etc --family needs to adjust also   -until she buys intot this, she will struggle -will try to find a psychologist in [place] area to make it easier on  traveling -did make a referral to neuro-rehab to assist with building a "cognitive/organization" routine which she can replicate at home. Also want to build some better strategies and techniques for memory retention. 6. Headaches---post traumatic vs migraine vs tension:  -continue with depakote for headache prophylaxis---stress mgt is important also -?needs glasses  -appropriate sleeping posture, position  7. Follow up in 1 months time with NP. 15 minutes of face to face patient care time were spent during this visit. All questions were encouraged and answered.

## 2015-03-28 ENCOUNTER — Encounter: Payer: Self-pay | Admitting: Registered Nurse

## 2015-03-28 ENCOUNTER — Encounter: Payer: Medicaid Other | Attending: Physical Medicine & Rehabilitation | Admitting: Registered Nurse

## 2015-03-28 ENCOUNTER — Other Ambulatory Visit: Payer: Self-pay | Admitting: Registered Nurse

## 2015-03-28 VITALS — BP 119/60 | HR 81 | Resp 16

## 2015-03-28 DIAGNOSIS — S82892A Other fracture of left lower leg, initial encounter for closed fracture: Secondary | ICD-10-CM | POA: Diagnosis present

## 2015-03-28 DIAGNOSIS — S12100S Unspecified displaced fracture of second cervical vertebra, sequela: Secondary | ICD-10-CM | POA: Diagnosis not present

## 2015-03-28 DIAGNOSIS — G894 Chronic pain syndrome: Secondary | ICD-10-CM | POA: Diagnosis not present

## 2015-03-28 DIAGNOSIS — M79672 Pain in left foot: Secondary | ICD-10-CM

## 2015-03-28 DIAGNOSIS — G589 Mononeuropathy, unspecified: Secondary | ICD-10-CM | POA: Insufficient documentation

## 2015-03-28 DIAGNOSIS — S82891A Other fracture of right lower leg, initial encounter for closed fracture: Secondary | ICD-10-CM | POA: Diagnosis present

## 2015-03-28 DIAGNOSIS — Z79899 Other long term (current) drug therapy: Secondary | ICD-10-CM

## 2015-03-28 DIAGNOSIS — Z5181 Encounter for therapeutic drug level monitoring: Secondary | ICD-10-CM

## 2015-03-28 DIAGNOSIS — S069X5S Unspecified intracranial injury with loss of consciousness greater than 24 hours with return to pre-existing conscious level, sequela: Secondary | ICD-10-CM

## 2015-03-28 DIAGNOSIS — S069X5A Unspecified intracranial injury with loss of consciousness greater than 24 hours with return to pre-existing conscious level, initial encounter: Secondary | ICD-10-CM | POA: Insufficient documentation

## 2015-03-28 DIAGNOSIS — S069X5D Unspecified intracranial injury with loss of consciousness greater than 24 hours with return to pre-existing conscious level, subsequent encounter: Secondary | ICD-10-CM | POA: Diagnosis present

## 2015-03-28 MED ORDER — METHYLPHENIDATE HCL 10 MG PO TABS: 10.0000 mg | ORAL_TABLET | Freq: Two times a day (BID) | ORAL | Status: DC

## 2015-03-28 MED ORDER — OXYCODONE HCL 10 MG PO TABS: 10.0000 mg | ORAL_TABLET | Freq: Two times a day (BID) | ORAL | Status: DC | PRN

## 2015-03-28 NOTE — Progress Notes (Signed)
Subjective:    Patient ID: Monique [name], female    DOB: [birth year], 29 y.o.   MRN: [id number]  HPI: [name] is a 29 year old female who returns for follow up for chronic pain and medication refill. She says her pain is located in her left foot. She rates her pain 4. Her current exercise regime is walking.  Per RN Assessment: Monique [name] was given  Methadone 10 mg instead of methylphenidate 10 mg.  She just picked up her medications today and took one with her oxycodone thinking she was taking Ritalin.  I have notified the pharmacy [facility] and spoke with pharmacist Lennette Bihari about this error, and notified Dr Naaman Plummer as well.  He states "she will need someone with her (she has a driver) and to stay with her today to watch for signs of oversedation.  She will return the methadone to the pharmacy and they will follow their procedure for reporting medication errors. Colin Rhein our director was notified and we will fill out a safety portal as well. Vital's checked at 11:45 127/74 HR: 76 Sat's 99%. Escorted to car via wheelchair.  Pain Inventory Average Pain 9 Pain Right Now 4 My pain is sharp, burning, stabbing, tingling and aching  In the last 24 hours, has pain interfered with the following? General activity 9 Relation with others 9 Enjoyment of life 9 What TIME of day is your pain at its worst? all Sleep (in general) Fair  Pain is worse with: walking, bending, standing and some activites Pain improves with: rest, therapy/exercise and medication Relief from Meds: 8  Mobility walk without assistance ability to climb steps?  yes do you drive?  no  Function disabled: date disabled .  Neuro/Psych weakness tingling confusion anxiety  Prior Studies Any changes since last visit?  no  Physicians involved in your care Any changes since last visit?  no   Family History  Problem Relation Age of Onset  . Cancer Paternal Grandmother     Ovarian   History     Social History  . Marital Status: Single    Spouse Name: N/A  . Number of Children: N/A  . Years of Education: N/A   Social History Main Topics  . Smoking status: Passive Smoke Exposure - Never Smoker  . Smokeless tobacco: Never Used  . Alcohol Use: Yes     Comment: occasional  . Drug Use: No  . Sexual Activity: Yes   Other Topics Concern  . None   Social History Narrative   Past Surgical History  Procedure Laterality Date  . Orif ankle fracture Left 12/14/2012    Procedure: OPEN REDUCTION INTERNAL FIXATION (ORIF) ANKLE FRACTURE;  Surgeon: [name], MD;  Location: [place];  Service: Orthopedics;  Laterality: Left;  . I&d extremity Left 12/14/2012    Procedure: IRRIGATION AND DEBRIDEMENT EXTREMITY;  Surgeon: [name], MD;  Location: [place];  Service: Orthopedics;  Laterality: Left;  . External fixation leg Bilateral 12/14/2012    Procedure: EXTERNAL FIXATION LEG;  Surgeon: [name], MD;  Location: [place];  Service: Orthopedics;  Laterality: Bilateral;  . Appendectomy    . Ceasarean section  2007, 2010  . Orif tibia fracture Bilateral 12/23/2012    Procedure: OPEN REDUCTION INTERNAL FIXATION (ORIF) BILATERAL DISTAL TIBIA FRACTURE;  Surgeon: [name], MD;  Location: [place];  Service: Orthopedics;  Laterality: Bilateral;  . External fixation removal Bilateral 12/23/2012    Procedure: REMOVAL EXTERNAL FIXATION LEG;  Surgeon: [name], MD;  Location: [place];  Service: Orthopedics;  Laterality: Bilateral;   Past Medical History  Diagnosis Date  . Depression   . Anxiety   . Heart murmur   . Anemia    BP 119/60  P 81  R 16 O2 Sat 99% Opioid Risk Score:   Fall Risk Score: High Fall Risk (>13 points) (score is low but rated high risk because of history.)`1  Depression screen PHQ 2/9  Depression screen PHQ 2/9 12/24/2014  Decreased Interest 2  Down, Depressed, Hopeless 2  PHQ - 2 Score 4  Altered sleeping 3  Tired, decreased energy 3  Change in appetite 2   Feeling bad or failure about yourself  2  Trouble concentrating 3  Moving slowly or fidgety/restless 3  Suicidal thoughts 0  PHQ-9 Score 20     Review of Systems  Neurological: Positive for weakness.       Tingling  Psychiatric/Behavioral: Positive for confusion. The patient is nervous/anxious.   All other systems reviewed and are negative.      Objective:   Physical Exam  Constitutional: She is oriented to person, place, and time. She appears well-developed and well-nourished.  HENT:  Head: Normocephalic and atraumatic.  Neck: Normal range of motion. Neck supple.  Cardiovascular: Normal rate and regular rhythm.   Pulmonary/Chest: Effort normal and breath sounds normal.  Musculoskeletal: She exhibits edema.  Normal Muscle Bulk and Muscle Testing Reveals: Upper Extremities: Full ROM and Muscle Strength 5/5 Lower Extremities: Full ROM and Muscle Strength 5/5 Left Lower Extremity Flexion Produces Pain into Left Foot Arises from chair with ease Narrow BasedGait  Neurological: She is alert and oriented to person, place, and time.  Skin: Skin is warm and dry.  Psychiatric: She has a normal mood and affect.  Nursing note and vitals reviewed.         Assessment & Plan:  1. [id number] to Monitor/More Focused: Continue Ritalin 5 mg ont tablet twice a day #60. Second script given  2. Bilateral Pilon Fractures: Continue Zanaflex and Mobic  3. Panic Attacks: Continue Klonopin and Counseling. 4. Tibial Nerve Injuries: Refilled: OXYcodone 10 mg 1 tablet twice a day as needed. #60. Second script given. 5. Post traumatic Stress disorder: Referral to counseling.  20 minutes of face to face patient care time was spent during this visit. All questions were encouraged and answered.   F/U Visit in 2 month

## 2015-03-29 ENCOUNTER — Telehealth: Payer: Self-pay | Admitting: Physical Medicine & Rehabilitation

## 2015-03-29 NOTE — Telephone Encounter (Signed)
Called Monique [name] to inquire how she was doing after medication mix up at the pharmacy. [facility] dispensed Methadone instead of Ritalin. Patient stated she was tired but could not sleep, itching, and her stomach hurt. She did suffer some anxiety and is not sure if it was the medication "or her nerves". I also called Monique [name] at [facility] to inquire about correcting the dispensed medication error. Monique [name] assured me that Monique [name] returned the incorrect medication and the correct medication was dispensed. I reinforced to Monique [name] to call 911 if she experienced any life threatening symptoms and to call our office if she had any more symptoms or concerns. She thanked me for checking on her.

## 2015-03-30 ENCOUNTER — Ambulatory Visit: Payer: Medicaid Other

## 2015-03-30 LAB — PMP ALCOHOL METABOLITE (ETG): ETGU: NEGATIVE ng/mL

## 2015-03-31 ENCOUNTER — Telehealth: Payer: Self-pay | Admitting: *Deleted

## 2015-03-31 LAB — METHYLPHENIDATE METAB, QUANT, U

## 2015-03-31 NOTE — Telephone Encounter (Signed)
Lab did not receive enough urine to perform test, advising recollection, [phone]

## 2015-04-01 LAB — OXYCODONE, URINE (LC/MS-MS)
Noroxycodone, Ur: 1576 ng/mL (ref <50)
OXYCODONE, UR: 1870 ng/mL (ref <50)
Oxymorphone: 820 ng/mL (ref <50)

## 2015-04-01 LAB — OPIATES/OPIOIDS (LC/MS-MS)
Codeine Urine: NEGATIVE ng/mL (ref <50)
Hydrocodone: NEGATIVE ng/mL (ref <50)
Hydromorphone: NEGATIVE ng/mL (ref <50)
MORPHINE: NEGATIVE ng/mL (ref <50)
Norhydrocodone, Ur: NEGATIVE ng/mL (ref <50)
Noroxycodone, Ur: 1576 ng/mL (ref <50)
OXYMORPHONE, URINE: 820 ng/mL (ref <50)
Oxycodone, ur: 1870 ng/mL (ref <50)

## 2015-04-02 LAB — PRESCRIPTION MONITORING PROFILE (SOLSTAS)
Amphetamine/Meth: NEGATIVE ng/mL
BARBITURATE SCREEN, URINE: NEGATIVE ng/mL
BUPRENORPHINE, URINE: NEGATIVE ng/mL
Benzodiazepine Screen, Urine: NEGATIVE ng/mL
CREATININE, URINE: 121.4 mg/dL (ref >20.0)
Cannabinoid Scrn, Ur: NEGATIVE ng/mL
Carisoprodol, Urine: NEGATIVE ng/mL
Cocaine Metabolites: NEGATIVE ng/mL
Fentanyl, Ur: NEGATIVE ng/mL
MDMA URINE: NEGATIVE ng/mL
METHADONE SCREEN, URINE: NEGATIVE ng/mL
Meperidine, Ur: NEGATIVE ng/mL
Nitrites, Initial: NEGATIVE ug/mL
PH URINE, INITIAL: 5.9 pH (ref 4.5–8.9)
Propoxyphene: NEGATIVE ng/mL
TAPENTADOLUR: NEGATIVE ng/mL
Tramadol Scrn, Ur: NEGATIVE ng/mL
Zolpidem, Urine: NEGATIVE ng/mL

## 2015-04-05 ENCOUNTER — Telehealth: Payer: Self-pay | Admitting: Physical Medicine & Rehabilitation

## 2015-04-05 DIAGNOSIS — F068 Other specified mental disorders due to known physiological condition: Secondary | ICD-10-CM

## 2015-04-05 DIAGNOSIS — S069X0S Unspecified intracranial injury without loss of consciousness, sequela: Principal | ICD-10-CM

## 2015-04-05 DIAGNOSIS — R4189 Other symptoms and signs involving cognitive functions and awareness: Secondary | ICD-10-CM

## 2015-04-05 NOTE — Telephone Encounter (Signed)
Formal neuropsych testing has been ordered with Dr. Valentina Shaggy per SLP request. Please make patient aware.   \  thanks

## 2015-04-05 NOTE — Telephone Encounter (Signed)
Attempted to reach [facility] but no answer and no voicemail picked up.  Mobile number is not in service.

## 2015-04-06 NOTE — Telephone Encounter (Signed)
Notified Oley Balm

## 2015-04-06 NOTE — Progress Notes (Signed)
Urine drug screen for this encounter is consistent for prescribed medication.  She reported last oxycodone was taken 03/25/15 and Ritalin and test done 6/20.  Oxycodone is definitely present and with Monique [name]'s TBI she has great difficulty remembering things.  The ritalin test was cancelled due to insufficient quantity of urine to perform test.

## 2015-04-07 ENCOUNTER — Ambulatory Visit: Payer: Medicaid Other | Attending: Physical Medicine & Rehabilitation | Admitting: Speech Pathology

## 2015-04-07 DIAGNOSIS — R41841 Cognitive communication deficit: Secondary | ICD-10-CM | POA: Insufficient documentation

## 2015-04-07 NOTE — Patient Instructions (Signed)
  3 ring binder  Dividers  Bring to therapy  You can bring papers if you  Want to work on organizing this
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
## 2015-04-27 ENCOUNTER — Ambulatory Visit: Payer: Medicaid Other | Attending: Psychology | Admitting: Psychology

## 2015-04-27 DIAGNOSIS — F419 Anxiety disorder, unspecified: Secondary | ICD-10-CM | POA: Diagnosis not present

## 2015-04-27 DIAGNOSIS — F32A Depression, unspecified: Secondary | ICD-10-CM

## 2015-04-27 DIAGNOSIS — F329 Major depressive disorder, single episode, unspecified: Secondary | ICD-10-CM

## 2015-04-27 DIAGNOSIS — S069X0S Unspecified intracranial injury without loss of consciousness, sequela: Secondary | ICD-10-CM

## 2015-04-27 DIAGNOSIS — F068 Other specified mental disorders due to known physiological condition: Secondary | ICD-10-CM

## 2015-04-27 NOTE — Progress Notes (Signed)
[facility]  [address]   Telephone [phone] [address] Fax [phone] [place], [place] [address]  Initial Contact Note  Name:  Monique [name] Date of Birth; 1986-07-09 MRN:  [id number] Date:  04/27/2015  Monique [name] is an 29 y.o. female with a history of traumatic brain injury in March 2014 who was referred for neuropsychological evaluation by Alger Simons, MD of [place].    A total of 5 hours was spent today reviewing medical records, interviewing (CPT [phone]) Artemio Aly and administering and scoring neurocognitive tests (CPT [phone] & [phone]).  Preliminary Diagnostic Impressions: Cognitive Deficits as late effects of traumatic brain injury [F06.8; S06.9X0S] Depressive Disorder [F32.9] Anxiety Disorder, unspecified [F41.9]  There were no concerns expressed or behaviors displayed by Artemio Aly that would require immediate attention.   A full report will follow once the planned testing has been completed. Her next appointment is scheduled for 05/10/15.   Jamey Ripa, Ph.D Licensed Psychologist 04/27/2015

## 2015-05-10 ENCOUNTER — Ambulatory Visit: Payer: Medicaid Other | Attending: Psychology | Admitting: Psychology

## 2015-05-10 DIAGNOSIS — F329 Major depressive disorder, single episode, unspecified: Secondary | ICD-10-CM | POA: Diagnosis not present

## 2015-05-10 DIAGNOSIS — S069X0S Unspecified intracranial injury without loss of consciousness, sequela: Secondary | ICD-10-CM | POA: Diagnosis not present

## 2015-05-10 DIAGNOSIS — F068 Other specified mental disorders due to known physiological condition: Secondary | ICD-10-CM | POA: Diagnosis not present

## 2015-05-10 DIAGNOSIS — F32A Depression, unspecified: Secondary | ICD-10-CM

## 2015-05-10 DIAGNOSIS — F419 Anxiety disorder, unspecified: Secondary | ICD-10-CM
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
## 2015-05-24 ENCOUNTER — Encounter: Payer: Medicaid Other | Attending: Physical Medicine & Rehabilitation | Admitting: Registered Nurse

## 2015-05-24 ENCOUNTER — Encounter: Payer: Self-pay | Admitting: Registered Nurse

## 2015-05-24 VITALS — BP 123/78 | HR 85

## 2015-05-24 DIAGNOSIS — S069X5A Unspecified intracranial injury with loss of consciousness greater than 24 hours with return to pre-existing conscious level, initial encounter: Secondary | ICD-10-CM | POA: Insufficient documentation

## 2015-05-24 DIAGNOSIS — S069X5S Unspecified intracranial injury with loss of consciousness greater than 24 hours with return to pre-existing conscious level, sequela: Secondary | ICD-10-CM

## 2015-05-24 DIAGNOSIS — S82892A Other fracture of left lower leg, initial encounter for closed fracture: Secondary | ICD-10-CM | POA: Diagnosis present

## 2015-05-24 DIAGNOSIS — S82891A Other fracture of right lower leg, initial encounter for closed fracture: Secondary | ICD-10-CM

## 2015-05-24 DIAGNOSIS — G894 Chronic pain syndrome: Secondary | ICD-10-CM | POA: Diagnosis not present

## 2015-05-24 DIAGNOSIS — S069X5D Unspecified intracranial injury with loss of consciousness greater than 24 hours with return to pre-existing conscious level, subsequent encounter: Secondary | ICD-10-CM | POA: Diagnosis present

## 2015-05-24 DIAGNOSIS — Z79899 Other long term (current) drug therapy: Secondary | ICD-10-CM

## 2015-05-24 DIAGNOSIS — S12100S Unspecified displaced fracture of second cervical vertebra, sequela: Secondary | ICD-10-CM

## 2015-05-24 DIAGNOSIS — Z5181 Encounter for therapeutic drug level monitoring: Secondary | ICD-10-CM | POA: Diagnosis not present

## 2015-05-24 DIAGNOSIS — R4189 Other symptoms and signs involving cognitive functions and awareness: Secondary | ICD-10-CM

## 2015-05-24 DIAGNOSIS — S069X0S Unspecified intracranial injury without loss of consciousness, sequela: Secondary | ICD-10-CM

## 2015-05-24 DIAGNOSIS — G589 Mononeuropathy, unspecified: Secondary | ICD-10-CM | POA: Insufficient documentation

## 2015-05-24 DIAGNOSIS — F068 Other specified mental disorders due to known physiological condition: Secondary | ICD-10-CM | POA: Diagnosis not present

## 2015-05-24 MED ORDER — METHYLPHENIDATE HCL 10 MG PO TABS: 10.0000 mg | ORAL_TABLET | Freq: Two times a day (BID) | ORAL | Status: DC

## 2015-05-24 MED ORDER — OXYCODONE HCL 10 MG PO TABS: 10.0000 mg | ORAL_TABLET | Freq: Two times a day (BID) | ORAL | Status: DC | PRN

## 2015-05-24 NOTE — Progress Notes (Signed)
Subjective:    Patient ID: Monique [name], female    DOB: Jul 04, 1986, 29 y.o.   MRN: [id number]  HPI: [name] is a 29 year old female who returns for follow up for chronic pain and medication refill. She says her pain is located in her neck and bilateral feet. Also states at times she was having abdominal discomfort encouraged to f/u with PCP and Gastroenterologist, she verbalizes understanding. She rates her pain 2. Her current exercise regime is walking. Monique [name] seen Dr. Valentina Shaggy for neurocognitive testing, Monique [name] states she's working on modalities to help her decrease her stress and modalities for memory. She's using a calendar to help organize her monthly routine. Boyfriend in room.  Pain Inventory Average Pain 8 Pain Right Now 2 My pain is constant, sharp, stabbing and aching  In the last 24 hours, has pain interfered with the following? General activity 9 Relation with others 9 Enjoyment of life 9 What TIME of day is your pain at its worst? morning, evening, night Sleep (in general) Fair  Pain is worse with: walking, bending, standing and some activites Pain improves with: rest, heat/ice and medication Relief from Meds: 8  Mobility walk without assistance ability to climb steps?  yes do you drive?  no Do you have any goals in this area?  no  Function disabled: date disabled na  Neuro/Psych spasms confusion anxiety  Prior Studies Any changes since last visit?  no  Physicians involved in your care Any changes since last visit?  no   Family History  Problem Relation Age of Onset  . Cancer Paternal Grandmother     Ovarian   Social History   Social History  . Marital Status: Single    Spouse Name: N/A  . Number of Children: N/A  . Years of Education: N/A   Social History Main Topics  . Smoking status: Passive Smoke Exposure - Never Smoker  . Smokeless tobacco: Never Used  . Alcohol Use: Yes     Comment: occasional  . Drug  Use: No  . Sexual Activity: Yes   Other Topics Concern  . None   Social History Narrative   Past Surgical History  Procedure Laterality Date  . Orif ankle fracture Left 12/14/2012    Procedure: OPEN REDUCTION INTERNAL FIXATION (ORIF) ANKLE FRACTURE;  Surgeon: [name], MD;  Location: [place];  Service: Orthopedics;  Laterality: Left;  . I&d extremity Left 12/14/2012    Procedure: IRRIGATION AND DEBRIDEMENT EXTREMITY;  Surgeon: [name], MD;  Location: [place];  Service: Orthopedics;  Laterality: Left;  . External fixation leg Bilateral 12/14/2012    Procedure: EXTERNAL FIXATION LEG;  Surgeon: [name], MD;  Location: [place];  Service: Orthopedics;  Laterality: Bilateral;  . Appendectomy    . Ceasarean section  2007, 2010  . Orif tibia fracture Bilateral 12/23/2012    Procedure: OPEN REDUCTION INTERNAL FIXATION (ORIF) BILATERAL DISTAL TIBIA FRACTURE;  Surgeon: [name], MD;  Location: [place];  Service: Orthopedics;  Laterality: Bilateral;  . External fixation removal Bilateral 12/23/2012    Procedure: REMOVAL EXTERNAL FIXATION LEG;  Surgeon: [name], MD;  Location: [place];  Service: Orthopedics;  Laterality: Bilateral;   Past Medical History  Diagnosis Date  . Depression   . Anxiety   . Heart murmur   . Anemia    BP 123/78 mmHg  Pulse 85  SpO2 96%  Opioid Risk Score:   Fall Risk Score:  `1  Depression screen PHQ 2/9  Depression screen PHQ 2/9 12/24/2014  Decreased Interest 2  Down, Depressed, Hopeless 2  PHQ - 2 Score 4  Altered sleeping 3  Tired, decreased energy 3  Change in appetite 2  Feeling bad or failure about yourself  2  Trouble concentrating 3  Moving slowly or fidgety/restless 3  Suicidal thoughts 0  PHQ-9 Score 20     Review of Systems  Gastrointestinal: Positive for abdominal pain.  All other systems reviewed and are negative.      Objective:   Physical Exam  Constitutional: She is oriented to person, place, and time. She  appears well-developed and well-nourished.  HENT:  Head: Normocephalic and atraumatic.  Neck: Normal range of motion. Neck supple.  Cardiovascular: Normal rate and regular rhythm.   Pulmonary/Chest: Effort normal and breath sounds normal.  Musculoskeletal:  Normal Muscle Bulk and Muscle Testing Reveals: Upper Extremities: Full ROM and Muscle Strength 5/5 Lower Extremities: Full ROM and Muscle strength 5/5 Arises from chair with ease Narrow based gait    Neurological: She is alert and oriented to person, place, and time.  Skin: Skin is warm and dry.  Psychiatric: She has a normal mood and affect.  Nursing note and vitals reviewed.         Assessment & Plan:  1. RDE:YCXKGYJE to Monitor/More Focused: Continue Ritalin 5 mg ont tablet twice a day #60. Second script given  2. Bilateral Pilon Fractures: Continue Zanaflex and Mobic  3. Panic Attacks: Continue Klonopin and Counseling. 4. Tibial Nerve Injuries: Refilled: OXYcodone 10 mg 1 tablet twice a day as needed. #60. Second script given. 5. Post traumatic Stress disorder: F/U with counseling.  20 minutes of face to face patient care time was spent during this visit. All questions were encouraged and answered.   F/U Visit in 2 month

## 2015-06-03 ENCOUNTER — Encounter: Payer: Self-pay | Admitting: Psychology

## 2015-07-18 ENCOUNTER — Encounter: Payer: Medicaid Other | Attending: Physical Medicine & Rehabilitation | Admitting: Registered Nurse

## 2015-07-18 DIAGNOSIS — S069X5D Unspecified intracranial injury with loss of consciousness greater than 24 hours with return to pre-existing conscious level, subsequent encounter: Secondary | ICD-10-CM | POA: Insufficient documentation

## 2015-07-18 DIAGNOSIS — S12100S Unspecified displaced fracture of second cervical vertebra, sequela: Secondary | ICD-10-CM | POA: Insufficient documentation

## 2015-07-18 DIAGNOSIS — S82892A Other fracture of left lower leg, initial encounter for closed fracture: Secondary | ICD-10-CM | POA: Insufficient documentation

## 2015-07-18 DIAGNOSIS — G589 Mononeuropathy, unspecified: Secondary | ICD-10-CM | POA: Insufficient documentation

## 2015-07-18 DIAGNOSIS — S069X5A Unspecified intracranial injury with loss of consciousness greater than 24 hours with return to pre-existing conscious level, initial encounter: Secondary | ICD-10-CM | POA: Insufficient documentation

## 2015-07-18 DIAGNOSIS — S82891A Other fracture of right lower leg, initial encounter for closed fracture: Secondary | ICD-10-CM | POA: Insufficient documentation

## 2015-08-10 ENCOUNTER — Telehealth: Payer: Self-pay | Admitting: Registered Nurse

## 2015-08-10 ENCOUNTER — Encounter: Payer: Medicaid Other | Attending: Physical Medicine & Rehabilitation | Admitting: Registered Nurse

## 2015-08-10 DIAGNOSIS — S82891A Other fracture of right lower leg, initial encounter for closed fracture: Secondary | ICD-10-CM | POA: Insufficient documentation

## 2015-08-10 DIAGNOSIS — S069X5D Unspecified intracranial injury with loss of consciousness greater than 24 hours with return to pre-existing conscious level, subsequent encounter: Secondary | ICD-10-CM | POA: Insufficient documentation

## 2015-08-10 DIAGNOSIS — S069X5A Unspecified intracranial injury with loss of consciousness greater than 24 hours with return to pre-existing conscious level, initial encounter: Secondary | ICD-10-CM | POA: Insufficient documentation

## 2015-08-10 DIAGNOSIS — S12100S Unspecified displaced fracture of second cervical vertebra, sequela: Secondary | ICD-10-CM | POA: Insufficient documentation

## 2015-08-10 DIAGNOSIS — G589 Mononeuropathy, unspecified: Secondary | ICD-10-CM | POA: Insufficient documentation

## 2015-08-10 DIAGNOSIS — S82892A Other fracture of left lower leg, initial encounter for closed fracture: Secondary | ICD-10-CM | POA: Insufficient documentation

## 2015-08-10 NOTE — Telephone Encounter (Signed)
Laced a call to Ms. Lickteig, no answer unable to leave message. Will try again regarding her appointment and medication refill.

## 2016-02-02 ENCOUNTER — Other Ambulatory Visit: Payer: Self-pay

## 2016-02-13 ENCOUNTER — Encounter: Payer: Self-pay | Admitting: Physical Medicine & Rehabilitation

## 2016-02-13 ENCOUNTER — Encounter: Payer: Medicaid Other | Attending: Physical Medicine & Rehabilitation | Admitting: Physical Medicine & Rehabilitation

## 2016-02-13 VITALS — BP 115/74 | HR 78 | Resp 17

## 2016-02-13 DIAGNOSIS — G8929 Other chronic pain: Secondary | ICD-10-CM | POA: Insufficient documentation

## 2016-02-13 DIAGNOSIS — R252 Cramp and spasm: Secondary | ICD-10-CM | POA: Insufficient documentation

## 2016-02-13 DIAGNOSIS — F419 Anxiety disorder, unspecified: Secondary | ICD-10-CM | POA: Insufficient documentation

## 2016-02-13 DIAGNOSIS — G589 Mononeuropathy, unspecified: Secondary | ICD-10-CM

## 2016-02-13 DIAGNOSIS — F068 Other specified mental disorders due to known physiological condition: Secondary | ICD-10-CM | POA: Diagnosis not present

## 2016-02-13 DIAGNOSIS — Z7722 Contact with and (suspected) exposure to environmental tobacco smoke (acute) (chronic): Secondary | ICD-10-CM | POA: Insufficient documentation

## 2016-02-13 DIAGNOSIS — S82872A Displaced pilon fracture of left tibia, initial encounter for closed fracture: Secondary | ICD-10-CM | POA: Diagnosis not present

## 2016-02-13 DIAGNOSIS — D649 Anemia, unspecified: Secondary | ICD-10-CM | POA: Insufficient documentation

## 2016-02-13 DIAGNOSIS — Z79899 Other long term (current) drug therapy: Secondary | ICD-10-CM | POA: Insufficient documentation

## 2016-02-13 DIAGNOSIS — F329 Major depressive disorder, single episode, unspecified: Secondary | ICD-10-CM | POA: Diagnosis not present

## 2016-02-13 DIAGNOSIS — S82891A Other fracture of right lower leg, initial encounter for closed fracture: Secondary | ICD-10-CM | POA: Diagnosis not present

## 2016-02-13 DIAGNOSIS — S82892A Other fracture of left lower leg, initial encounter for closed fracture: Secondary | ICD-10-CM

## 2016-02-13 DIAGNOSIS — R51 Headache: Secondary | ICD-10-CM | POA: Diagnosis not present

## 2016-02-13 DIAGNOSIS — R011 Cardiac murmur, unspecified: Secondary | ICD-10-CM | POA: Diagnosis not present

## 2016-02-13 DIAGNOSIS — R42 Dizziness and giddiness: Secondary | ICD-10-CM | POA: Diagnosis not present

## 2016-02-13 DIAGNOSIS — X58XXXA Exposure to other specified factors, initial encounter: Secondary | ICD-10-CM | POA: Diagnosis not present

## 2016-02-13 DIAGNOSIS — S069X0S Unspecified intracranial injury without loss of consciousness, sequela: Secondary | ICD-10-CM | POA: Diagnosis not present

## 2016-02-13 DIAGNOSIS — R410 Disorientation, unspecified: Secondary | ICD-10-CM | POA: Insufficient documentation

## 2016-02-13 DIAGNOSIS — S82871A Displaced pilon fracture of right tibia, initial encounter for closed fracture: Secondary | ICD-10-CM | POA: Diagnosis not present

## 2016-02-13 DIAGNOSIS — R4189 Other symptoms and signs involving cognitive functions and awareness: Secondary | ICD-10-CM

## 2016-02-13 DIAGNOSIS — G5740 Lesion of medial popliteal nerve, unspecified lower limb: Secondary | ICD-10-CM

## 2016-02-13 DIAGNOSIS — S12100A Unspecified displaced fracture of second cervical vertebra, initial encounter for closed fracture: Secondary | ICD-10-CM | POA: Insufficient documentation

## 2016-02-13 DIAGNOSIS — Z9889 Other specified postprocedural states: Secondary | ICD-10-CM | POA: Insufficient documentation

## 2016-02-13 DIAGNOSIS — S069X0A Unspecified intracranial injury without loss of consciousness, initial encounter: Secondary | ICD-10-CM | POA: Diagnosis not present

## 2016-02-13 NOTE — Progress Notes (Signed)
Subjective:    Patient ID: Monique [name], female    DOB: 10-16-1985, 30 y.o.   MRN: [id number]  HPI  Monique [name] is here in follow up of her chronic pain. She just had a baby girl 11 days ago. She came off of all her meds while pregnant. She is currently on oxycodone and ibuprofen for her pelvic and incisional pain. She has had some cramping and spasms in her feet, typically at night. She continues to deal with daily stress at home. Her memory and concentration are still impaired, and she still seems to be surprised as to why it's happening.   She now has 3 children, the new born, a 44 year old, and a 31 year old. She would like to go back to school and get trained for anew vocation.    Pain Inventory Average Pain 7 Pain Right Now 3 My pain is sharp, stabbing, tingling and aching  In the last 24 hours, has pain interfered with the following? General activity 8 Relation with others 8 Enjoyment of life 8 What TIME of day is your pain at its worst? night Sleep (in general) NA  Pain is worse with: standing and some activites Pain improves with: NA Relief from Meds: NA  Mobility walk without assistance how many minutes can you walk? 15 ability to climb steps?  yes do you drive?  no  Function not employed: date last employed NA disabled: date disabled NA  Neuro/Psych spasms dizziness confusion anxiety  Prior Studies Any changes since last visit?  no  Physicians involved in your care Any changes since last visit?  yes OBGYN-C Section 02/02/2016   Family History  Problem Relation Age of Onset  . Cancer Paternal Grandmother     Ovarian   Social History   Social History  . Marital Status: Single    Spouse Name: N/A  . Number of Children: N/A  . Years of Education: N/A   Social History Main Topics  . Smoking status: Passive Smoke Exposure - Never Smoker  . Smokeless tobacco: Never Used  . Alcohol Use: Yes     Comment: occasional  . Drug Use: No  .  Sexual Activity: Yes   Other Topics Concern  . None   Social History Narrative   Past Surgical History  Procedure Laterality Date  . Orif ankle fracture Left 12/14/2012    Procedure: OPEN REDUCTION INTERNAL FIXATION (ORIF) ANKLE FRACTURE;  Surgeon: [name], MD;  Location: [place];  Service: Orthopedics;  Laterality: Left;  . I&d extremity Left 12/14/2012    Procedure: IRRIGATION AND DEBRIDEMENT EXTREMITY;  Surgeon: [name], MD;  Location: [place];  Service: Orthopedics;  Laterality: Left;  . External fixation leg Bilateral 12/14/2012    Procedure: EXTERNAL FIXATION LEG;  Surgeon: [name], MD;  Location: [place];  Service: Orthopedics;  Laterality: Bilateral;  . Appendectomy    . Ceasarean section  2007, 2010  . Orif tibia fracture Bilateral 12/23/2012    Procedure: OPEN REDUCTION INTERNAL FIXATION (ORIF) BILATERAL DISTAL TIBIA FRACTURE;  Surgeon: [name], MD;  Location: [place];  Service: Orthopedics;  Laterality: Bilateral;  . External fixation removal Bilateral 12/23/2012    Procedure: REMOVAL EXTERNAL FIXATION LEG;  Surgeon: [name], MD;  Location: [place];  Service: Orthopedics;  Laterality: Bilateral;   Past Medical History  Diagnosis Date  . Depression   . Anxiety   . Heart murmur   . Anemia    BP  115/74 mmHg  Pulse 78  Resp 17  SpO2 100%  Opioid Risk Score:   Fall Risk Score:  `1  Depression screen PHQ 2/9  Depression screen PHQ 2/9 12/24/2014  Decreased Interest 2  Down, Depressed, Hopeless 2  PHQ - 2 Score 4  Altered sleeping 3  Tired, decreased energy 3  Change in appetite 2  Feeling bad or failure about yourself  2  Trouble concentrating 3  Moving slowly or fidgety/restless 3  Suicidal thoughts 0  PHQ-9 Score 20       Review of Systems  All other systems reviewed and are negative.      Objective:   Physical Exam  General: Alert and oriented x 3. No distress  HEENT: Head is normocephalic, atraumatic, PERRLA, EOMI, sclera  anicteric, oral mucosa pink and moist, dentition intact, ext ear canals clear,  Neck: Supple without JVD or lymphadenopathy  Heart: Reg rate and rhythm. No murmurs rubs or gallops  Chest: CTA bilaterally without wheezes, rales, or rhonchi; no distress  Abdomen: Soft, non-tender, non-distended,   Extremities: No clubbing, cyanosis, notable edema as below. Pulses are 2+  Skin: Clean and intact without signs of breakdown  Neuro: Pt has fair insight, and awareness. Cranial nerves 2-12 are intact. Sensory exam is normal except for the bases of the feet which are hypersensitive to touch and lack FT discrimination. Monique [name] attention at times still and needs to have things repeated to her for attention. Moves from topic to topic quickly during our visit.   Musculoskeletal: PRESERVED cervical rom in all planes. Scalp slightly sensitive to touch. Sternum tender to touch, mid Body. mildy tenderness on soft tissue of neck, no limitations in cervical rom, no phonation/swallowing pain.  Psych: Pt's affect is cooperative. More calm. Minimally anxious.     Assessment & Plan:   1. Pain control: Oxycodone per obgyn -asked the patient to speak with her pediatrician regarding the use of narcotics, muscles relaxants, anticonvulsatns, stimulants while breast feeding. It would be my preference to avoid narcotics moving forward..  2. Bilateral pilon fractures, C2 fractures  -ibuprofen  3. TBI:  -? Re-trial of ritalin at some point depending upon needs moving forward. I think if she has any vocational or educational aspirations, she will need some help. 4.. Headaches---post traumatic vs migraine vs tension: controlled at present 5 Discussed organizational skills, need for sleep, routine, etc 6 Follow up in 2 months time with me. 25 minutes of face to face patient care time were spent during this visit. All questions were encouraged and answered.

## 2016-02-13 NOTE — Patient Instructions (Signed)
DISCUSS WITH YOUR PEDIATRICIAN REGARDING THE SAFETY OF USING THE FOLLOWING TYPES OF MEDICATIONS WHILE BREAST FEEDING:  MUSCLE RELAXANTS STIMULANTS FOR CONCENTRATION/MEMORY ANTICONVULSANTS FOR HEADACHES   REMEMBER TO ORGANIZE YOURSELF.  CREATE A ROUTINE MAKE LISTS/TAKE NOTES DO THINGS ONE AT A TIME GET ADEQUATE SLEEP, 8-10 HOURS PER NIGHT

## 2016-04-04 ENCOUNTER — Encounter: Payer: Medicaid Other | Admitting: Physical Medicine & Rehabilitation

## 2016-04-13 ENCOUNTER — Telehealth: Payer: Self-pay | Admitting: Registered Nurse

## 2016-04-13 ENCOUNTER — Encounter: Payer: Medicaid Other | Attending: Physical Medicine & Rehabilitation | Admitting: Registered Nurse

## 2016-04-13 ENCOUNTER — Encounter: Payer: Self-pay | Admitting: Registered Nurse

## 2016-04-13 VITALS — BP 115/78 | HR 73 | Resp 17

## 2016-04-13 DIAGNOSIS — S82891A Other fracture of right lower leg, initial encounter for closed fracture: Secondary | ICD-10-CM

## 2016-04-13 DIAGNOSIS — M62838 Other muscle spasm: Secondary | ICD-10-CM

## 2016-04-13 DIAGNOSIS — R51 Headache: Secondary | ICD-10-CM | POA: Diagnosis not present

## 2016-04-13 DIAGNOSIS — R42 Dizziness and giddiness: Secondary | ICD-10-CM | POA: Insufficient documentation

## 2016-04-13 DIAGNOSIS — G894 Chronic pain syndrome: Secondary | ICD-10-CM

## 2016-04-13 DIAGNOSIS — S12100A Unspecified displaced fracture of second cervical vertebra, initial encounter for closed fracture: Secondary | ICD-10-CM | POA: Diagnosis not present

## 2016-04-13 DIAGNOSIS — D649 Anemia, unspecified: Secondary | ICD-10-CM | POA: Diagnosis not present

## 2016-04-13 DIAGNOSIS — S82871A Displaced pilon fracture of right tibia, initial encounter for closed fracture: Secondary | ICD-10-CM | POA: Diagnosis not present

## 2016-04-13 DIAGNOSIS — G8929 Other chronic pain: Secondary | ICD-10-CM | POA: Insufficient documentation

## 2016-04-13 DIAGNOSIS — F419 Anxiety disorder, unspecified: Secondary | ICD-10-CM | POA: Insufficient documentation

## 2016-04-13 DIAGNOSIS — G589 Mononeuropathy, unspecified: Secondary | ICD-10-CM | POA: Diagnosis not present

## 2016-04-13 DIAGNOSIS — S069X0S Unspecified intracranial injury without loss of consciousness, sequela: Secondary | ICD-10-CM

## 2016-04-13 DIAGNOSIS — F329 Major depressive disorder, single episode, unspecified: Secondary | ICD-10-CM | POA: Diagnosis not present

## 2016-04-13 DIAGNOSIS — Z79899 Other long term (current) drug therapy: Secondary | ICD-10-CM | POA: Insufficient documentation

## 2016-04-13 DIAGNOSIS — S82872A Displaced pilon fracture of left tibia, initial encounter for closed fracture: Secondary | ICD-10-CM | POA: Insufficient documentation

## 2016-04-13 DIAGNOSIS — R011 Cardiac murmur, unspecified: Secondary | ICD-10-CM | POA: Diagnosis not present

## 2016-04-13 DIAGNOSIS — S069X5S Unspecified intracranial injury with loss of consciousness greater than 24 hours with return to pre-existing conscious level, sequela: Secondary | ICD-10-CM

## 2016-04-13 DIAGNOSIS — F068 Other specified mental disorders due to known physiological condition: Secondary | ICD-10-CM | POA: Diagnosis not present

## 2016-04-13 DIAGNOSIS — S069X0A Unspecified intracranial injury without loss of consciousness, initial encounter: Secondary | ICD-10-CM | POA: Insufficient documentation

## 2016-04-13 DIAGNOSIS — R410 Disorientation, unspecified: Secondary | ICD-10-CM | POA: Diagnosis not present

## 2016-04-13 DIAGNOSIS — Z7722 Contact with and (suspected) exposure to environmental tobacco smoke (acute) (chronic): Secondary | ICD-10-CM | POA: Insufficient documentation

## 2016-04-13 DIAGNOSIS — R252 Cramp and spasm: Secondary | ICD-10-CM | POA: Diagnosis not present

## 2016-04-13 DIAGNOSIS — R296 Repeated falls: Secondary | ICD-10-CM

## 2016-04-13 DIAGNOSIS — Z9889 Other specified postprocedural states: Secondary | ICD-10-CM | POA: Diagnosis not present

## 2016-04-13 DIAGNOSIS — G5740 Lesion of medial popliteal nerve, unspecified lower limb: Secondary | ICD-10-CM

## 2016-04-13 DIAGNOSIS — F411 Generalized anxiety disorder: Secondary | ICD-10-CM

## 2016-04-13 DIAGNOSIS — S82892A Other fracture of left lower leg, initial encounter for closed fracture: Secondary | ICD-10-CM

## 2016-04-13 MED ORDER — TIZANIDINE HCL 2 MG PO TABS: 2.0000 mg | ORAL_TABLET | Freq: Every day | ORAL | Status: DC | PRN

## 2016-04-13 MED ORDER — CLONAZEPAM 0.5 MG PO TABS: 0.2500 mg | ORAL_TABLET | Freq: Every day | ORAL | Status: DC | PRN

## 2016-04-13 NOTE — Progress Notes (Signed)
Subjective:    Patient ID: Monique [name], female    DOB: 27-Mar-1986, 30 y.o.   MRN: [id number]  HPI: Monique [name] is a 30 year old female who returns for follow up for chronic pain. She states her pain is located in her lower back and bilateral feet. She rates her pain 8. Her current exercise regime is walking. Monique [name] has a newborn and states she has stopped breast feeding, it has been a few weeks due to her cognitive impairment as it relates to eating a healthy diet and remembering to eat. She is asking for her medications to be resumed. Reviewed Dr. Naaman Plummer note, Oxycodone will not be resume at this time. She admits she is anxious and having muscle spasms, we will resume the klonopin and tizandine as a daily dose. She states she  will not be resuming breast feeding.  I will speak to Dr. Naaman Plummer regarding the oxycodone and give her a call she verbalizes understanding.  According to [facility] her last Oxycodone prescription was picked up on 02/04/2016.  Also states she has fallen twice, once she was walking in the mall and her right leg gave out, she landed on her knees. Her mother helped her up. The other fall she was walking in her home and her right leg gave out and she landed on the couch. Pain Inventory Average Pain 8 Pain Right Now 8 My pain is NA  In the last 24 hours, has pain interfered with the following? General activity 9 Relation with others 9 Enjoyment of life 9 What TIME of day is your pain at its worst? morning, evening, night Sleep (in general) Fair  Pain is worse with: walking, standing and some activites Pain improves with: rest and medication Relief from Meds: 2  Mobility use a walker ability to climb steps?  no do you drive?  no Do you have any goals in this area?  yes  Function I need assistance with the following:  dressing, bathing, meal prep and household duties Do you have any goals in this area?   yes  Neuro/Psych weakness confusion anxiety  Prior Studies Any changes since last visit?  no  Physicians involved in your care Any changes since last visit?  no   Family History  Problem Relation Age of Onset  . Cancer Paternal Grandmother     Ovarian   Social History   Social History  . Marital Status: Single    Spouse Name: N/A  . Number of Children: N/A  . Years of Education: N/A   Social History Main Topics  . Smoking status: Passive Smoke Exposure - Never Smoker  . Smokeless tobacco: Never Used  . Alcohol Use: Yes     Comment: occasional  . Drug Use: No  . Sexual Activity: Yes   Other Topics Concern  . None   Social History Narrative   Past Surgical History  Procedure Laterality Date  . Orif ankle fracture Left 12/14/2012    Procedure: OPEN REDUCTION INTERNAL FIXATION (ORIF) ANKLE FRACTURE;  Surgeon: [name], MD;  Location: [place];  Service: Orthopedics;  Laterality: Left;  . I&d extremity Left 12/14/2012    Procedure: IRRIGATION AND DEBRIDEMENT EXTREMITY;  Surgeon: [name], MD;  Location: [place];  Service: Orthopedics;  Laterality: Left;  . External fixation leg Bilateral 12/14/2012    Procedure: EXTERNAL FIXATION LEG;  Surgeon: [name], MD;  Location: [place];  Service: Orthopedics;  Laterality: Bilateral;  . Appendectomy    .  Ceasarean section  2007, 2010,2014  . Orif tibia fracture Bilateral 12/23/2012    Procedure: OPEN REDUCTION INTERNAL FIXATION (ORIF) BILATERAL DISTAL TIBIA FRACTURE;  Surgeon: [name], MD;  Location: [place];  Service: Orthopedics;  Laterality: Bilateral;  . External fixation removal Bilateral 12/23/2012    Procedure: REMOVAL EXTERNAL FIXATION LEG;  Surgeon: [name], MD;  Location: [place];  Service: Orthopedics;  Laterality: Bilateral;   Past Medical History  Diagnosis Date  . Depression   . Anxiety   . Heart murmur   . Anemia    BP 115/78 mmHg  Pulse 73  Resp 17  LMP 04/02/2016  Opioid Risk Score:    Fall Risk Score:  `1  Depression screen PHQ 2/9  Depression screen PHQ 2/9 12/24/2014  Decreased Interest 2  Down, Depressed, Hopeless 2  PHQ - 2 Score 4  Altered sleeping 3  Tired, decreased energy 3  Change in appetite 2  Feeling bad or failure about yourself  2  Trouble concentrating 3  Moving slowly or fidgety/restless 3  Suicidal thoughts 0  PHQ-9 Score 20     Review of Systems  Constitutional: Positive for unexpected weight change.  Neurological: Positive for weakness.  Psychiatric/Behavioral: Positive for confusion. The patient is nervous/anxious.   All other systems reviewed and are negative.      Objective:   Physical Exam  Constitutional: She is oriented to person, place, and time. She appears well-developed and well-nourished.  HENT:  Head: Normocephalic and atraumatic.  Neck: Normal range of motion. Neck supple.  Cardiovascular: Normal rate and regular rhythm.   Pulmonary/Chest: Effort normal and breath sounds normal.  Musculoskeletal:  Normal Muscle Bulk and Muscle Testing Reveals: Upper Extremities: Full ROM and Muscle Strength 5/5 Lower Extremities: Full ROM and Muscle Strength 5/5 Arises from table with ease Narrow Based Gait  Neurological: She is alert and oriented to person, place, and time.  Skin: Skin is warm and dry.  Psychiatric: She has a normal mood and affect.  Nursing note and vitals reviewed.         Assessment & Plan:  1.TBI:Continue to Monitor Continue to use various devices to help with her Organization such as notes, calendars and using her cell phone to set reminders. 2. Bilateral Pilon Fractures: Resume Zanaflex  And continue ibuprofen 3. Panic Attacks: ResumeKlonopin and Referral for Counseling. 4. Tibial Nerve Injuries: Refilled: Continue to Monitor  5. Post traumatic Stress disorder: Referral for counseling.  30 minutes of face to face patient care time was spent during this visit. All questions were encouraged and  answered.   F/U Visit in 2 month

## 2016-04-13 NOTE — Telephone Encounter (Signed)
Dr. Naaman Plummer I seen Ms. [facility] today, she was requesting Oxycodone. According to your note you didn't want to resume the narcotics.  Her last Oxycodone prescription was picked up on 02/04/2016. She states she is having pain in her bilateral feet and the ibuprofen is not working.   She is no longer breastfeeding,. She is not interested in resuming the Ritalin at this time. Let me know if you would like me to resume the Oxycodone at a lower dose 5 mg daily.   Thank You

## 2016-04-13 NOTE — Patient Instructions (Signed)
Tizanidine can cause drowsiness:   Take Daily as needed.

## 2016-04-18 ENCOUNTER — Telehealth: Payer: Self-pay | Admitting: Physical Medicine & Rehabilitation

## 2016-04-18 MED ORDER — DIVALPROEX SODIUM 250 MG PO DR TAB: 250.0000 mg | DELAYED_RELEASE_TABLET | Freq: Every day | ORAL | Status: DC

## 2016-04-18 NOTE — Telephone Encounter (Signed)
Patient is requesting Monique [name] call her in something for her migraines.  She is not sure what medication that she had been given before.  Please call her at [phone].

## 2016-04-18 NOTE — Telephone Encounter (Signed)
Return Monique [name] call, She has been having migraines, in the past she has taken Depakote. Depakote ordered, she verbalizes understanding.

## 2016-04-23 NOTE — Telephone Encounter (Signed)
Patient has called back about a script - would like a phone call back

## 2016-04-23 NOTE — Telephone Encounter (Signed)
Placed a call to Ms. Winburn line busy. Spoke with Dr. Naaman Plummer regarding resuming her Oxycodone. Dr. Naaman Plummer will resume her Oxycodone, Ms. Sorrentino will have follow up appointments every two months , she must come to all appointments, otherwise analgesics will be discontinued. Will attempt to call Ms. Burgeson again. Placed another call to Ms. Remley at 16:20 line remains busy.

## 2016-04-24 ENCOUNTER — Telehealth: Payer: Self-pay | Admitting: Registered Nurse

## 2016-04-24 ENCOUNTER — Telehealth: Payer: Self-pay | Admitting: Physical Medicine & Rehabilitation

## 2016-04-24 NOTE — Telephone Encounter (Signed)
Patient returning Monique [name] phone call.

## 2016-04-24 NOTE — Telephone Encounter (Signed)
Called Monique [name] today, unable to reach her. Line busy, will await for her to re-call.

## 2016-04-25 ENCOUNTER — Telehealth: Payer: Self-pay | Admitting: Registered Nurse

## 2016-04-25 DIAGNOSIS — S82892A Other fracture of left lower leg, initial encounter for closed fracture: Secondary | ICD-10-CM

## 2016-04-25 DIAGNOSIS — S12100S Unspecified displaced fracture of second cervical vertebra, sequela: Secondary | ICD-10-CM

## 2016-04-25 DIAGNOSIS — S82891A Other fracture of right lower leg, initial encounter for closed fracture: Secondary | ICD-10-CM

## 2016-04-25 MED ORDER — OXYCODONE HCL 10 MG PO TABS: 10.0000 mg | ORAL_TABLET | Freq: Two times a day (BID) | ORAL | Status: DC | PRN

## 2016-04-25 NOTE — Telephone Encounter (Signed)
Placed a call to Monique [name], no answer unable to leave a message. Will await her return call.

## 2016-04-25 NOTE — Telephone Encounter (Signed)
Spoke to Monique [name], she is aware we will prescribe her Oxycodone. She has to keep all of her appointments she verbalizes understanding.  She is scheduled to see Dr. Naaman Plummer on 06/19/16, she verbalizes understanding.   Prescriptions printed, Monique [name] or her mother will pick up the prescriptions.

## 2016-06-19 ENCOUNTER — Ambulatory Visit: Payer: Self-pay | Admitting: Physical Medicine & Rehabilitation

## 2016-06-21 ENCOUNTER — Encounter: Payer: Medicare Other | Attending: Physical Medicine & Rehabilitation | Admitting: Registered Nurse

## 2016-06-21 ENCOUNTER — Telehealth: Payer: Self-pay | Admitting: *Deleted

## 2016-06-21 ENCOUNTER — Encounter: Payer: Self-pay | Admitting: Registered Nurse

## 2016-06-21 VITALS — BP 109/73 | HR 77 | Resp 14

## 2016-06-21 DIAGNOSIS — R252 Cramp and spasm: Secondary | ICD-10-CM | POA: Insufficient documentation

## 2016-06-21 DIAGNOSIS — Z9889 Other specified postprocedural states: Secondary | ICD-10-CM | POA: Diagnosis not present

## 2016-06-21 DIAGNOSIS — R51 Headache: Secondary | ICD-10-CM | POA: Insufficient documentation

## 2016-06-21 DIAGNOSIS — S069X5S Unspecified intracranial injury with loss of consciousness greater than 24 hours with return to pre-existing conscious level, sequela: Secondary | ICD-10-CM

## 2016-06-21 DIAGNOSIS — S82871A Displaced pilon fracture of right tibia, initial encounter for closed fracture: Secondary | ICD-10-CM | POA: Diagnosis not present

## 2016-06-21 DIAGNOSIS — S82872A Displaced pilon fracture of left tibia, initial encounter for closed fracture: Secondary | ICD-10-CM | POA: Insufficient documentation

## 2016-06-21 DIAGNOSIS — S12100A Unspecified displaced fracture of second cervical vertebra, initial encounter for closed fracture: Secondary | ICD-10-CM | POA: Insufficient documentation

## 2016-06-21 DIAGNOSIS — S12100S Unspecified displaced fracture of second cervical vertebra, sequela: Secondary | ICD-10-CM

## 2016-06-21 DIAGNOSIS — Z79899 Other long term (current) drug therapy: Secondary | ICD-10-CM | POA: Insufficient documentation

## 2016-06-21 DIAGNOSIS — Z7722 Contact with and (suspected) exposure to environmental tobacco smoke (acute) (chronic): Secondary | ICD-10-CM | POA: Insufficient documentation

## 2016-06-21 DIAGNOSIS — D649 Anemia, unspecified: Secondary | ICD-10-CM | POA: Insufficient documentation

## 2016-06-21 DIAGNOSIS — G894 Chronic pain syndrome: Secondary | ICD-10-CM

## 2016-06-21 DIAGNOSIS — S069X0A Unspecified intracranial injury without loss of consciousness, initial encounter: Secondary | ICD-10-CM | POA: Diagnosis not present

## 2016-06-21 DIAGNOSIS — S82891A Other fracture of right lower leg, initial encounter for closed fracture: Secondary | ICD-10-CM

## 2016-06-21 DIAGNOSIS — Z5181 Encounter for therapeutic drug level monitoring: Secondary | ICD-10-CM | POA: Diagnosis not present

## 2016-06-21 DIAGNOSIS — G8929 Other chronic pain: Secondary | ICD-10-CM | POA: Insufficient documentation

## 2016-06-21 DIAGNOSIS — F419 Anxiety disorder, unspecified: Secondary | ICD-10-CM | POA: Diagnosis not present

## 2016-06-21 DIAGNOSIS — R42 Dizziness and giddiness: Secondary | ICD-10-CM | POA: Diagnosis not present

## 2016-06-21 DIAGNOSIS — F329 Major depressive disorder, single episode, unspecified: Secondary | ICD-10-CM | POA: Insufficient documentation

## 2016-06-21 DIAGNOSIS — S82892A Other fracture of left lower leg, initial encounter for closed fracture: Secondary | ICD-10-CM

## 2016-06-21 DIAGNOSIS — R011 Cardiac murmur, unspecified: Secondary | ICD-10-CM | POA: Diagnosis not present

## 2016-06-21 DIAGNOSIS — R410 Disorientation, unspecified: Secondary | ICD-10-CM | POA: Diagnosis not present

## 2016-06-21 DIAGNOSIS — F068 Other specified mental disorders due to known physiological condition: Secondary | ICD-10-CM

## 2016-06-21 DIAGNOSIS — S069X0S Unspecified intracranial injury without loss of consciousness, sequela: Secondary | ICD-10-CM

## 2016-06-21 MED ORDER — OXYCODONE HCL 10 MG PO TABS: 10.0000 mg | ORAL_TABLET | Freq: Two times a day (BID) | ORAL | 0 refills | Status: DC | PRN

## 2016-06-21 MED ORDER — CLONAZEPAM 0.5 MG PO TABS: 0.2500 mg | ORAL_TABLET | Freq: Every evening | ORAL | 2 refills | Status: DC | PRN

## 2016-06-21 MED ORDER — TIZANIDINE HCL 2 MG PO TABS: 2.0000 mg | ORAL_TABLET | Freq: Every day | ORAL | 2 refills | Status: DC | PRN

## 2016-06-21 MED ORDER — CLONAZEPAM 0.5 MG PO TABS: ORAL_TABLET | ORAL | 2 refills | Status: DC

## 2016-06-21 NOTE — Telephone Encounter (Signed)
Prior authorization submitted to [facility] for oxycodone 10 mg

## 2016-06-21 NOTE — Progress Notes (Signed)
Subjective:    Patient ID: Monique [name], female    DOB: 12-17-1985, 30 y.o.   MRN: [id number]  HPI: [name] is a 30 year old female who returns for follow up for chronic pain and medication refill. She states her pain is located in her lower back occassionally and bilateral feet. She rates her pain 8. Her current exercise regime is walking. Monique [name] brought in Handicap [employer]ac[employer] form, I sent Dr. Naaman Plummer a message regarding her driving. Dr. Naaman Plummer states she has permission for Daytime Local Driving Only.  The form will be signed and mailed to Monique [name] with a letter.   Pain Inventory Average Pain 8 Pain Right Now 8 My pain is na  In the last 24 hours, has pain interfered with the following? General activity 9 Relation with others 9 Enjoyment of life 9 What TIME of day is your pain at its worst? all Sleep (in general) Poor  Pain is worse with: walking, standing and some activites Pain improves with: rest and medication Relief from Meds: 2  Mobility walk without assistance  Function disabled: date disabled . I need assistance with the following:  meal prep, household duties and shopping  Neuro/Psych weakness confusion anxiety  Prior Studies Any changes since last visit?  no  Physicians involved in your care Any changes since last visit?  no   Family History  Problem Relation Age of Onset  . Cancer Paternal Grandmother     Ovarian   Social History   Social History  . Marital status: Single    Spouse name: N/A  . Number of children: N/A  . Years of education: N/A   Social History Main Topics  . Smoking status: Passive Smoke Exposure - Never Smoker  . Smokeless tobacco: Never Used  . Alcohol use Yes     Comment: occasional  . Drug use: No  . Sexual activity: Yes   Other Topics Concern  . None   Social History Narrative  . None   Past Surgical History:  Procedure Laterality Date  . APPENDECTOMY    . Ceasarean section   2007, 2010,2014  . EXTERNAL FIXATION LEG Bilateral 12/14/2012   Procedure: EXTERNAL FIXATION LEG;  Surgeon: [name], MD;  Location: [place];  Service: Orthopedics;  Laterality: Bilateral;  . EXTERNAL FIXATION REMOVAL Bilateral 12/23/2012   Procedure: REMOVAL EXTERNAL FIXATION LEG;  Surgeon: [name], MD;  Location: [place];  Service: Orthopedics;  Laterality: Bilateral;  . I&D EXTREMITY Left 12/14/2012   Procedure: IRRIGATION AND DEBRIDEMENT EXTREMITY;  Surgeon: [name], MD;  Location: [place];  Service: Orthopedics;  Laterality: Left;  . ORIF ANKLE FRACTURE Left 12/14/2012   Procedure: OPEN REDUCTION INTERNAL FIXATION (ORIF) ANKLE FRACTURE;  Surgeon: [name], MD;  Location: [place];  Service: Orthopedics;  Laterality: Left;  . ORIF TIBIA FRACTURE Bilateral 12/23/2012   Procedure: OPEN REDUCTION INTERNAL FIXATION (ORIF) BILATERAL DISTAL TIBIA FRACTURE;  Surgeon: [name], MD;  Location: [place];  Service: Orthopedics;  Laterality: Bilateral;   Past Medical History:  Diagnosis Date  . Anemia   . Anxiety   . Depression   . Heart murmur    BP 109/73 (BP Location: Right Arm, Patient Position: Sitting, Cuff Size: Large)   Pulse 77   Resp 14   SpO2 98%   Opioid Risk Score:   Fall Risk Score:  `1  Depression screen PHQ 2/9  Depression screen [facility] 2/9 12/24/2014  Decreased Interest 2  Down, Depressed, Hopeless 2  PHQ - 2 Score 4  Altered sleeping 3  Tired, decreased energy 3  Change in appetite 2  Feeling bad or failure about yourself  2  Trouble concentrating 3  Moving slowly or fidgety/restless 3  Suicidal thoughts 0  PHQ-9 Score 20    Review of Systems  All other systems reviewed and are negative.      Objective:   Physical Exam  Constitutional: She is oriented to person, place, and time. She appears well-developed and well-nourished.  HENT:  Head: Normocephalic and atraumatic.  Neck: Normal range of motion. Neck supple.  Cardiovascular: Normal rate and  regular rhythm.   Pulmonary/Chest: Effort normal and breath sounds normal.  Musculoskeletal:  Normal Muscle Bulk and Muscle Testing Reveals: Upper Extremities: Full ROM and Muscle Strength 5/5 Lower Extremities: Full ROM and Muscle Strength 5/5 Right Lower Extremity Flexion Produces pain into Right Foot Arises from Table with ease Narrow Based Gait  Neurological: She is alert and oriented to person, place, and time.  Skin: Skin is warm and dry.  Psychiatric: She has a normal mood and affect.  Nursing note and vitals reviewed.         Assessment & Plan:  1.TBI:Continue to Monitor Continue to use various devices to help with her Organization such as notes, calendars and using her cell phone to set reminders. 2. Chronic Pain Syndrome: Refilled: Oxycodone 10 mg one tablet BID as needed as needed #60. Second script given for the following month. 2. Bilateral Pilon Fractures:  Continue Zanaflex and Ibuprofen 3. Panic Attacks: Resume Klonopin 4. Tibial Nerve Injuries: Continue to monitor 5. Post traumatic Stress disorder: Awaiting on  counseling.  30 minutes of face to face patient care time was spent during this visit. All questions were encouraged and answered.   F/U Visit in 2 month

## 2016-06-29 LAB — TOXASSURE SELECT,+ANTIDEPR,UR

## 2016-07-09 NOTE — Progress Notes (Signed)
Urine drug screen for this encounter is consistent for prescribed medication 

## 2016-08-20 ENCOUNTER — Encounter: Payer: Self-pay | Admitting: Physical Medicine & Rehabilitation

## 2016-08-20 ENCOUNTER — Encounter: Payer: Medicare Other | Attending: Physical Medicine & Rehabilitation | Admitting: Physical Medicine & Rehabilitation

## 2016-08-20 VITALS — BP 121/82 | HR 92 | Resp 14

## 2016-08-20 DIAGNOSIS — Z7722 Contact with and (suspected) exposure to environmental tobacco smoke (acute) (chronic): Secondary | ICD-10-CM | POA: Diagnosis not present

## 2016-08-20 DIAGNOSIS — S12100A Unspecified displaced fracture of second cervical vertebra, initial encounter for closed fracture: Secondary | ICD-10-CM | POA: Diagnosis not present

## 2016-08-20 DIAGNOSIS — G8929 Other chronic pain: Secondary | ICD-10-CM | POA: Diagnosis present

## 2016-08-20 DIAGNOSIS — Z9889 Other specified postprocedural states: Secondary | ICD-10-CM | POA: Insufficient documentation

## 2016-08-20 DIAGNOSIS — D649 Anemia, unspecified: Secondary | ICD-10-CM | POA: Insufficient documentation

## 2016-08-20 DIAGNOSIS — F068 Other specified mental disorders due to known physiological condition: Secondary | ICD-10-CM | POA: Diagnosis not present

## 2016-08-20 DIAGNOSIS — Z79899 Other long term (current) drug therapy: Secondary | ICD-10-CM | POA: Insufficient documentation

## 2016-08-20 DIAGNOSIS — R51 Headache: Secondary | ICD-10-CM | POA: Diagnosis not present

## 2016-08-20 DIAGNOSIS — R4189 Other symptoms and signs involving cognitive functions and awareness: Secondary | ICD-10-CM

## 2016-08-20 DIAGNOSIS — F329 Major depressive disorder, single episode, unspecified: Secondary | ICD-10-CM | POA: Diagnosis not present

## 2016-08-20 DIAGNOSIS — F419 Anxiety disorder, unspecified: Secondary | ICD-10-CM | POA: Insufficient documentation

## 2016-08-20 DIAGNOSIS — S82871A Displaced pilon fracture of right tibia, initial encounter for closed fracture: Secondary | ICD-10-CM | POA: Insufficient documentation

## 2016-08-20 DIAGNOSIS — R252 Cramp and spasm: Secondary | ICD-10-CM | POA: Insufficient documentation

## 2016-08-20 DIAGNOSIS — S82891S Other fracture of right lower leg, sequela: Secondary | ICD-10-CM

## 2016-08-20 DIAGNOSIS — R410 Disorientation, unspecified: Secondary | ICD-10-CM | POA: Diagnosis not present

## 2016-08-20 DIAGNOSIS — S82872A Displaced pilon fracture of left tibia, initial encounter for closed fracture: Secondary | ICD-10-CM | POA: Insufficient documentation

## 2016-08-20 DIAGNOSIS — S069X0S Unspecified intracranial injury without loss of consciousness, sequela: Secondary | ICD-10-CM

## 2016-08-20 DIAGNOSIS — R011 Cardiac murmur, unspecified: Secondary | ICD-10-CM | POA: Diagnosis not present

## 2016-08-20 DIAGNOSIS — S82892S Other fracture of left lower leg, sequela: Secondary | ICD-10-CM | POA: Diagnosis not present

## 2016-08-20 DIAGNOSIS — S069X0A Unspecified intracranial injury without loss of consciousness, initial encounter: Secondary | ICD-10-CM | POA: Insufficient documentation

## 2016-08-20 DIAGNOSIS — R42 Dizziness and giddiness: Secondary | ICD-10-CM | POA: Insufficient documentation

## 2016-08-20 MED ORDER — OXYCODONE HCL 10 MG PO TABS: 10.0000 mg | ORAL_TABLET | Freq: Two times a day (BID) | ORAL | 0 refills | Status: DC | PRN

## 2016-08-20 NOTE — Progress Notes (Signed)
Subjective:    Patient ID: Monique [name], female    DOB: [birth year], 30 y.o.   MRN: [id number]  HPI   Mersadez is here in follow up of her TBI and chronic pain. She notes some occasional tingling in her hands and forearms when she holds her newborn baby or when she wakes up at night.   For pain she uses oxycodone with reasonable control.   She notes that she's been more anxious lately.  Attention is still an issue. She tends to panic when she drives. She is usuing klonopin daily prn.   Pain Inventory Average Pain 5 Pain Right Now 5 My pain is constant, sharp and tingling  In the last 24 hours, has pain interfered with the following? General activity 5 Relation with others 5 Enjoyment of life 5 What TIME of day is your pain at its worst? night Sleep (in general) Fair  Pain is worse with: walking, bending and some activites Pain improves with: rest, pacing activities and medication Relief from Meds: 8  Mobility walk without assistance ability to climb steps?  yes do you drive?  yes  Function disabled: date disabled n/a I need assistance with the following:  bathing  Neuro/Psych No problems in this area  Prior Studies Any changes since last visit?  no  Physicians involved in your care Any changes since last visit?  no   Family History  Problem Relation Age of Onset  . Cancer Paternal Grandmother     Ovarian   Social History   Social History  . Marital status: Single    Spouse name: N/A  . Number of children: N/A  . Years of education: N/A   Social History Main Topics  . Smoking status: Passive Smoke Exposure - Never Smoker  . Smokeless tobacco: Never Used  . Alcohol use Yes     Comment: occasional  . Drug use: No  . Sexual activity: Yes   Other Topics Concern  . None   Social History Narrative  . None   Past Surgical History:  Procedure Laterality Date  . APPENDECTOMY    . Ceasarean section  2007, 2010,2014  . EXTERNAL FIXATION LEG  Bilateral 12/14/2012   Procedure: EXTERNAL FIXATION LEG;  Surgeon: [name], MD;  Location: [place];  Service: Orthopedics;  Laterality: Bilateral;  . EXTERNAL FIXATION REMOVAL Bilateral 12/23/2012   Procedure: REMOVAL EXTERNAL FIXATION LEG;  Surgeon: [name], MD;  Location: [place];  Service: Orthopedics;  Laterality: Bilateral;  . I&D EXTREMITY Left 12/14/2012   Procedure: IRRIGATION AND DEBRIDEMENT EXTREMITY;  Surgeon: [name], MD;  Location: [place];  Service: Orthopedics;  Laterality: Left;  . ORIF ANKLE FRACTURE Left 12/14/2012   Procedure: OPEN REDUCTION INTERNAL FIXATION (ORIF) ANKLE FRACTURE;  Surgeon: [name], MD;  Location: [place];  Service: Orthopedics;  Laterality: Left;  . ORIF TIBIA FRACTURE Bilateral 12/23/2012   Procedure: OPEN REDUCTION INTERNAL FIXATION (ORIF) BILATERAL DISTAL TIBIA FRACTURE;  Surgeon: [name], MD;  Location: [place];  Service: Orthopedics;  Laterality: Bilateral;   Past Medical History:  Diagnosis Date  . Anemia   . Anxiety   . Depression   . Heart murmur    BP 121/82   Pulse 92   Resp 14   SpO2 95%   Opioid Risk Score:   Fall Risk Score:  `1  Depression screen PHQ 2/9  Depression screen PHQ 2/9 12/24/2014  Decreased Interest 2  Down, Depressed, Hopeless 2  PHQ -  2 Score 4  Altered sleeping 3  Tired, decreased energy 3  Change in appetite 2  Feeling bad or failure about yourself  2  Trouble concentrating 3  Moving slowly or fidgety/restless 3  Suicidal thoughts 0  PHQ-9 Score 20    Review of Systems  All other systems reviewed and are negative.      Objective:   Physical Exam  Constitutional: She is oriented to person, place, and time. She appears well-developed and well-nourished.  HENT:  Head: NCAT.  Neck: Normal range of motion. Neck supple.  Cardiovascular: RRR  Pulmonary/Chest:normal effort  Musculoskeletal:  Normal Muscle Bulk and Muscle Testing Reveals: Upper Extremities: Full ROM and Muscle  Strength 5/5 Lower Extremities: Full ROM and Muscle Strength 5/5 Right Lower Extremity Flexion Produces mild pain sole of foot.  Gait stable Neurological: She is alert and oriented to person, place, and time.  Skin: Skin is warm and dry.  Psychiatric: anxious at times.    .         Assessment & Plan:  1.TBI: further education provided  -needs routine as much as possible  -Continue to use various devices to help with her Organization such as notes, calendars and using her cell phone to set reminders.  2. Chronic Pain Syndrome: Refilled: Oxycodone 10 mg one tablet BID as needed as needed #60. 2nd script given for next month. 2. Bilateral Pilon Fractures:  Maintain Zanaflex and Ibuprofen 3. Panic Attacks: prn Klonopin 4. Tibial Nerve Injuries: Continue to monitor 5. Post traumatic Stress disorder: klonopin as needed.   -reviewed stress mgt strategies  30 minutes of face to face patient care time was spent during this visit. All questions were encouraged and answered.  Follow up in 2 months.

## 2016-08-20 NOTE — Patient Instructions (Signed)
WORK HARD TO MAINTAIN A REGULAR SCHEDULE AND TO CREATE A ROUTINE/STRUCTURE!!!1   PLEASE CALL ME WITH ANY PROBLEMS OR QUESTIONS [phone])

## 2016-10-19 ENCOUNTER — Encounter: Payer: Medicare Other | Attending: Physical Medicine & Rehabilitation | Admitting: Registered Nurse

## 2016-10-19 ENCOUNTER — Encounter: Payer: Self-pay | Admitting: Registered Nurse

## 2016-10-19 VITALS — BP 111/74 | HR 84

## 2016-10-19 DIAGNOSIS — S82872A Displaced pilon fracture of left tibia, initial encounter for closed fracture: Secondary | ICD-10-CM | POA: Diagnosis not present

## 2016-10-19 DIAGNOSIS — Z79899 Other long term (current) drug therapy: Secondary | ICD-10-CM | POA: Diagnosis not present

## 2016-10-19 DIAGNOSIS — G894 Chronic pain syndrome: Secondary | ICD-10-CM

## 2016-10-19 DIAGNOSIS — F329 Major depressive disorder, single episode, unspecified: Secondary | ICD-10-CM | POA: Diagnosis not present

## 2016-10-19 DIAGNOSIS — S12100A Unspecified displaced fracture of second cervical vertebra, initial encounter for closed fracture: Secondary | ICD-10-CM | POA: Diagnosis not present

## 2016-10-19 DIAGNOSIS — R252 Cramp and spasm: Secondary | ICD-10-CM | POA: Diagnosis not present

## 2016-10-19 DIAGNOSIS — Z9889 Other specified postprocedural states: Secondary | ICD-10-CM | POA: Insufficient documentation

## 2016-10-19 DIAGNOSIS — G8929 Other chronic pain: Secondary | ICD-10-CM | POA: Diagnosis not present

## 2016-10-19 DIAGNOSIS — F068 Other specified mental disorders due to known physiological condition: Secondary | ICD-10-CM

## 2016-10-19 DIAGNOSIS — S069X0S Unspecified intracranial injury without loss of consciousness, sequela: Secondary | ICD-10-CM

## 2016-10-19 DIAGNOSIS — S82892S Other fracture of left lower leg, sequela: Secondary | ICD-10-CM

## 2016-10-19 DIAGNOSIS — R011 Cardiac murmur, unspecified: Secondary | ICD-10-CM | POA: Diagnosis not present

## 2016-10-19 DIAGNOSIS — F419 Anxiety disorder, unspecified: Secondary | ICD-10-CM | POA: Insufficient documentation

## 2016-10-19 DIAGNOSIS — S069X0A Unspecified intracranial injury without loss of consciousness, initial encounter: Secondary | ICD-10-CM | POA: Diagnosis not present

## 2016-10-19 DIAGNOSIS — S82871A Displaced pilon fracture of right tibia, initial encounter for closed fracture: Secondary | ICD-10-CM | POA: Diagnosis not present

## 2016-10-19 DIAGNOSIS — Z7722 Contact with and (suspected) exposure to environmental tobacco smoke (acute) (chronic): Secondary | ICD-10-CM | POA: Diagnosis not present

## 2016-10-19 DIAGNOSIS — S82891S Other fracture of right lower leg, sequela: Secondary | ICD-10-CM

## 2016-10-19 DIAGNOSIS — Z5181 Encounter for therapeutic drug level monitoring: Secondary | ICD-10-CM | POA: Diagnosis not present

## 2016-10-19 DIAGNOSIS — R51 Headache: Secondary | ICD-10-CM | POA: Diagnosis not present

## 2016-10-19 DIAGNOSIS — M62838 Other muscle spasm: Secondary | ICD-10-CM | POA: Diagnosis not present

## 2016-10-19 DIAGNOSIS — D649 Anemia, unspecified: Secondary | ICD-10-CM | POA: Insufficient documentation

## 2016-10-19 DIAGNOSIS — R410 Disorientation, unspecified: Secondary | ICD-10-CM | POA: Insufficient documentation

## 2016-10-19 DIAGNOSIS — R42 Dizziness and giddiness: Secondary | ICD-10-CM | POA: Insufficient documentation

## 2016-10-19 MED ORDER — OXYCODONE HCL 10 MG PO TABS: 10.0000 mg | ORAL_TABLET | Freq: Two times a day (BID) | ORAL | 0 refills | Status: DC | PRN

## 2016-10-19 NOTE — Progress Notes (Signed)
Subjective:    Patient ID: Monique [name], female    DOB: 1986-08-14, 32 y.o.   MRN: [id number]  HPI:   Monique [name] is a 31 year old female who returns for follow up appointment for chronic pain and medication refill. She states her pain is located in her right lower extremity, right ankle and right foot.She rates her pain 6. Her current exercise regime is walking. Also states she's performing cognitive exercises with attention difficulties we discuss referring her to our  Psychologist for Cognitive testing, she agrees with treatment plan. Will have to await insurance approval.  Pain Inventory Average Pain 8 Pain Right Now 6 My pain is constant  In the last 24 hours, has pain interfered with the following? General activity 8 Relation with others 8 Enjoyment of life 8 What TIME of day is your pain at its worst? all Sleep (in general) .  Pain is worse with: walking, bending and some activites Pain improves with: rest, heat/ice and pacing activities Relief from Meds: 8  Mobility walk without assistance ability to climb steps?  yes do you drive?  yes  Function disabled: date disabled .  Neuro/Psych weakness tingling spasms confusion anxiety  Prior Studies Any changes since last visit?  no  Physicians involved in your care Any changes since last visit?  no   Family History  Problem Relation Age of Onset  . Cancer Paternal Grandmother     Ovarian   Social History   Social History  . Marital status: Single    Spouse name: N/A  . Number of children: N/A  . Years of education: N/A   Social History Main Topics  . Smoking status: Passive Smoke Exposure - Never Smoker  . Smokeless tobacco: Never Used  . Alcohol use Yes     Comment: occasional  . Drug use: No  . Sexual activity: Yes   Other Topics Concern  . Not on file   Social History Narrative  . No narrative on file   Past Surgical History:  Procedure Laterality Date  . APPENDECTOMY      . Ceasarean section  2007, 2010,2014  . EXTERNAL FIXATION LEG Bilateral 12/14/2012   Procedure: EXTERNAL FIXATION LEG;  Surgeon: [name], MD;  Location: [place];  Service: Orthopedics;  Laterality: Bilateral;  . EXTERNAL FIXATION REMOVAL Bilateral 12/23/2012   Procedure: REMOVAL EXTERNAL FIXATION LEG;  Surgeon: [name], MD;  Location: [place];  Service: Orthopedics;  Laterality: Bilateral;  . I&D EXTREMITY Left 12/14/2012   Procedure: IRRIGATION AND DEBRIDEMENT EXTREMITY;  Surgeon: [name], MD;  Location: [place];  Service: Orthopedics;  Laterality: Left;  . ORIF ANKLE FRACTURE Left 12/14/2012   Procedure: OPEN REDUCTION INTERNAL FIXATION (ORIF) ANKLE FRACTURE;  Surgeon: [name], MD;  Location: [place];  Service: Orthopedics;  Laterality: Left;  . ORIF TIBIA FRACTURE Bilateral 12/23/2012   Procedure: OPEN REDUCTION INTERNAL FIXATION (ORIF) BILATERAL DISTAL TIBIA FRACTURE;  Surgeon: [name], MD;  Location: [place];  Service: Orthopedics;  Laterality: Bilateral;   Past Medical History:  Diagnosis Date  . Anemia   . Anxiety   . Depression   . Heart murmur    There were no vitals taken for this visit.  Opioid Risk Score:   Fall Risk Score:  `1  Depression screen PHQ 2/9  Depression screen PHQ 2/9 12/24/2014  Decreased Interest 2  Down, Depressed, Hopeless 2  PHQ - 2 Score 4  Altered sleeping 3  Tired, decreased energy 3  Change in appetite 2  Feeling bad or failure about yourself  2  Trouble concentrating 3  Moving slowly or fidgety/restless 3  Suicidal thoughts 0  PHQ-9 Score 20   Review of Systems  HENT: Negative.   Eyes: Negative.   Respiratory: Negative.   Cardiovascular: Negative.   Gastrointestinal: Negative.   Endocrine: Negative.   Genitourinary: Negative.   Musculoskeletal: Negative.        Spasms  Skin: Negative.   Allergic/Immunologic: Negative.   Neurological: Positive for weakness.       Tingling  Hematological: Negative.    Psychiatric/Behavioral: Positive for confusion. The patient is nervous/anxious.   All other systems reviewed and are negative.      Objective:   Physical Exam  Constitutional: She is oriented to person, place, and time. She appears well-developed and well-nourished.  HENT:  Head: Normocephalic and atraumatic.  Neck: Normal range of motion. Neck supple.  Cardiovascular: Normal rate.   Pulmonary/Chest: Effort normal and breath sounds normal.  Musculoskeletal:  Normal Muscle Bulk and Muscle Testing Reveals: Upper Extremities: Full ROM and Muscle Strength 5/5 Lower Extremities: Full ROM and Muscle Strength 5/5 Right Lower Extremity Flexion Produces Pain into extremity. Arises from Chair with ease Narrow Based Gait  Neurological: She is alert and oriented to person, place, and time.  Skin: Skin is warm and dry.  Psychiatric: She has a normal mood and affect.  Nursing note and vitals reviewed.         Assessment & Plan:  1.TBI:Continue to Monitor Continue to use various devices to help with her Organization such as notes, calendars and using her cell phone to set reminders. 2. Chronic Pain Syndrome: Refilled: Oxycodone 10 mg one tablet BID as needed as needed #60. Second script given for the following month. 2. Bilateral Pilon Fractures:  Continue Zanaflex and Ibuprofen 3. AnxietyPanic Attacks: ContinueKlonopin 4. Tibial Nerve Injuries: Continue to monitor 5. Post traumatic Stress disorder: Awaiting on  counseling.  30 minutes of face to face patient care time was spent during this visit. All questions were encouraged and answered.   F/U Visit in 2 month

## 2016-10-26 LAB — TOXASSURE SELECT,+ANTIDEPR,UR

## 2016-10-29 NOTE — Progress Notes (Signed)
Urine drug screen for this encounter is consistent for prescribed medication 

## 2016-11-28 DIAGNOSIS — L910 Hypertrophic scar: Secondary | ICD-10-CM | POA: Diagnosis not present

## 2016-12-12 ENCOUNTER — Ambulatory Visit: Payer: Self-pay | Admitting: Registered Nurse

## 2016-12-14 ENCOUNTER — Encounter: Payer: Self-pay | Admitting: Registered Nurse

## 2016-12-14 ENCOUNTER — Encounter: Payer: Medicare Other | Attending: Physical Medicine & Rehabilitation | Admitting: Registered Nurse

## 2016-12-14 ENCOUNTER — Telehealth: Payer: Self-pay | Admitting: Registered Nurse

## 2016-12-14 VITALS — BP 112/76 | HR 89 | Resp 14

## 2016-12-14 DIAGNOSIS — S82891S Other fracture of right lower leg, sequela: Secondary | ICD-10-CM | POA: Diagnosis not present

## 2016-12-14 DIAGNOSIS — R42 Dizziness and giddiness: Secondary | ICD-10-CM | POA: Insufficient documentation

## 2016-12-14 DIAGNOSIS — R011 Cardiac murmur, unspecified: Secondary | ICD-10-CM | POA: Diagnosis not present

## 2016-12-14 DIAGNOSIS — S82872A Displaced pilon fracture of left tibia, initial encounter for closed fracture: Secondary | ICD-10-CM | POA: Insufficient documentation

## 2016-12-14 DIAGNOSIS — R252 Cramp and spasm: Secondary | ICD-10-CM | POA: Diagnosis not present

## 2016-12-14 DIAGNOSIS — F419 Anxiety disorder, unspecified: Secondary | ICD-10-CM | POA: Diagnosis not present

## 2016-12-14 DIAGNOSIS — S82892S Other fracture of left lower leg, sequela: Secondary | ICD-10-CM

## 2016-12-14 DIAGNOSIS — M62838 Other muscle spasm: Secondary | ICD-10-CM

## 2016-12-14 DIAGNOSIS — G894 Chronic pain syndrome: Secondary | ICD-10-CM | POA: Diagnosis not present

## 2016-12-14 DIAGNOSIS — R410 Disorientation, unspecified: Secondary | ICD-10-CM | POA: Diagnosis not present

## 2016-12-14 DIAGNOSIS — F411 Generalized anxiety disorder: Secondary | ICD-10-CM

## 2016-12-14 DIAGNOSIS — Z5181 Encounter for therapeutic drug level monitoring: Secondary | ICD-10-CM | POA: Diagnosis not present

## 2016-12-14 DIAGNOSIS — F068 Other specified mental disorders due to known physiological condition: Secondary | ICD-10-CM

## 2016-12-14 DIAGNOSIS — S069X0A Unspecified intracranial injury without loss of consciousness, initial encounter: Secondary | ICD-10-CM | POA: Diagnosis not present

## 2016-12-14 DIAGNOSIS — Z9889 Other specified postprocedural states: Secondary | ICD-10-CM | POA: Insufficient documentation

## 2016-12-14 DIAGNOSIS — Z79899 Other long term (current) drug therapy: Secondary | ICD-10-CM | POA: Diagnosis not present

## 2016-12-14 DIAGNOSIS — R51 Headache: Secondary | ICD-10-CM | POA: Insufficient documentation

## 2016-12-14 DIAGNOSIS — S82871A Displaced pilon fracture of right tibia, initial encounter for closed fracture: Secondary | ICD-10-CM | POA: Insufficient documentation

## 2016-12-14 DIAGNOSIS — G609 Hereditary and idiopathic neuropathy, unspecified: Secondary | ICD-10-CM

## 2016-12-14 DIAGNOSIS — Z7722 Contact with and (suspected) exposure to environmental tobacco smoke (acute) (chronic): Secondary | ICD-10-CM | POA: Diagnosis not present

## 2016-12-14 DIAGNOSIS — D649 Anemia, unspecified: Secondary | ICD-10-CM | POA: Insufficient documentation

## 2016-12-14 DIAGNOSIS — F329 Major depressive disorder, single episode, unspecified: Secondary | ICD-10-CM | POA: Insufficient documentation

## 2016-12-14 DIAGNOSIS — S069X0S Unspecified intracranial injury without loss of consciousness, sequela: Secondary | ICD-10-CM | POA: Diagnosis not present

## 2016-12-14 DIAGNOSIS — S12100A Unspecified displaced fracture of second cervical vertebra, initial encounter for closed fracture: Secondary | ICD-10-CM | POA: Diagnosis not present

## 2016-12-14 DIAGNOSIS — G8929 Other chronic pain: Secondary | ICD-10-CM | POA: Insufficient documentation

## 2016-12-14 MED ORDER — OXYCODONE HCL 10 MG PO TABS: 10.0000 mg | ORAL_TABLET | Freq: Three times a day (TID) | ORAL | 0 refills | Status: DC | PRN

## 2016-12-14 MED ORDER — CLONAZEPAM 0.5 MG PO TABS: ORAL_TABLET | ORAL | 2 refills | Status: DC

## 2016-12-14 MED ORDER — TIZANIDINE HCL 2 MG PO TABS: 2.0000 mg | ORAL_TABLET | Freq: Every day | ORAL | 2 refills | Status: DC | PRN

## 2016-12-14 MED ORDER — GABAPENTIN 100 MG PO CAPS: 100.0000 mg | ORAL_CAPSULE | Freq: Three times a day (TID) | ORAL | 3 refills | Status: DC

## 2016-12-14 NOTE — Progress Notes (Signed)
Subjective:    Patient ID: Monique [name], female    DOB: 1986/02/01, 31 y.o.   MRN: [id number]  HPI: [name] is a 31 year old female who returns for follow up appointment for chronic pain and medication refill. She states her pain is located in her bilateral shoulders, upper-back, right lower extremity with tingling and burning, right ankle and right foot. Also states her pain has increased in intensity in her right arm, she believes from carrying her baby car seat . Encouraged HEP she verbalizes understanding. Also lower back Pain, based on assessment we will increase her tablets this month. She rates her pain 8. Her current exercise regime is walking.  Also states she has fallen twice since last visit, two weeks ago she was walking and her right foot became hung on carpet and she landed on her knees, she was able to pick herself up. She didn't seek medical attention. Last week as she was walking in her home she tripped over a top on the floor and landed against the wall. She didn't hit the floor she states. educated on Falls Prevention, she verbalizes understanding.   Pain Inventory Average Pain 9 Pain Right Now 8 My pain is constant, sharp, tingling and aching  In the last 24 hours, has pain interfered with the following? General activity 10 Relation with others 10 Enjoyment of life 10 What TIME of day is your pain at its worst? evening Sleep (in general) Fair  Pain is worse with: walking, bending, sitting, standing and some activites Pain improves with: rest and medication Relief from Meds: 8  Mobility walk without assistance how many minutes can you walk? 10  Function disabled: date disabled . Do you have any goals in this area?  yes  Neuro/Psych numbness tingling spasms confusion anxiety  Prior Studies Any changes since last visit?  no  Physicians involved in your care Any changes since last visit?  no   Family History  Problem Relation Age  of Onset  . Cancer Paternal Grandmother     Ovarian   Social History   Social History  . Marital status: Single    Spouse name: N/A  . Number of children: N/A  . Years of education: N/A   Social History Main Topics  . Smoking status: Passive Smoke Exposure - Never Smoker  . Smokeless tobacco: Never Used  . Alcohol use Yes     Comment: occasional  . Drug use: No  . Sexual activity: Yes   Other Topics Concern  . None   Social History Narrative  . None   Past Surgical History:  Procedure Laterality Date  . APPENDECTOMY    . Ceasarean section  2007, 2010,2014  . EXTERNAL FIXATION LEG Bilateral 12/14/2012   Procedure: EXTERNAL FIXATION LEG;  Surgeon: [name], MD;  Location: [place];  Service: Orthopedics;  Laterality: Bilateral;  . EXTERNAL FIXATION REMOVAL Bilateral 12/23/2012   Procedure: REMOVAL EXTERNAL FIXATION LEG;  Surgeon: [name], MD;  Location: [place];  Service: Orthopedics;  Laterality: Bilateral;  . I&D EXTREMITY Left 12/14/2012   Procedure: IRRIGATION AND DEBRIDEMENT EXTREMITY;  Surgeon: [name], MD;  Location: [place];  Service: Orthopedics;  Laterality: Left;  . ORIF ANKLE FRACTURE Left 12/14/2012   Procedure: OPEN REDUCTION INTERNAL FIXATION (ORIF) ANKLE FRACTURE;  Surgeon: [name], MD;  Location: [place];  Service: Orthopedics;  Laterality: Left;  . ORIF TIBIA FRACTURE Bilateral 12/23/2012   Procedure: OPEN REDUCTION INTERNAL FIXATION (  ORIF) BILATERAL DISTAL TIBIA FRACTURE;  Surgeon: [name], MD;  Location: [place];  Service: Orthopedics;  Laterality: Bilateral;   Past Medical History:  Diagnosis Date  . Anemia   . Anxiety   . Depression   . Heart murmur    BP (!) 143/102   Pulse 96   Resp 14   SpO2 97%   Opioid Risk Score:   Fall Risk Score:  `1  Depression screen PHQ 2/9  Depression screen PHQ 2/9 12/24/2014  Decreased Interest 2  Down, Depressed, Hopeless 2  PHQ - 2 Score 4  Altered sleeping 3  Tired, decreased energy 3    Change in appetite 2  Feeling bad or failure about yourself  2  Trouble concentrating 3  Moving slowly or fidgety/restless 3  Suicidal thoughts 0  PHQ-9 Score 20    Review of Systems  Constitutional: Negative.   HENT: Negative.   Eyes: Negative.   Respiratory: Negative.   Cardiovascular: Negative.   Gastrointestinal: Negative.   Endocrine: Negative.   Genitourinary: Negative.   Musculoskeletal:       Spasms  Skin: Negative.   Allergic/Immunologic: Negative.   Neurological: Positive for numbness.       Tingling  Hematological: Negative.   Psychiatric/Behavioral: Positive for confusion. The patient is nervous/anxious.   All other systems reviewed and are negative.      Objective:   Physical Exam  Constitutional: She is oriented to person, place, and time. She appears well-developed and well-nourished.  HENT:  Head: Normocephalic and atraumatic.  Neck: Normal range of motion. Neck supple.  Cardiovascular: Normal rate and regular rhythm.   Pulmonary/Chest: Effort normal and breath sounds normal.  Musculoskeletal:  Normal Muscle Bulk and Muscle Testing Reveals: Upper Extremities: Full ROM and Muscle Strength 5/5 Bilateral AC Joint Tenderness Lumbar Paraspinal Tenderness: L-3-L-5  Lower Extremities: Right: Decreased ROM and Muscle Strength 4/5 Left: Full ROM and Muscle Strength 5/5 Arises from chair slowly Antalgic Gait  Neurological: She is alert and oriented to person, place, and time.  Skin: Skin is warm and dry.  Psychiatric: She has a normal mood and affect.  Nursing note and vitals reviewed.         Assessment & Plan:  1.TBI:Continue to Monitor. 12/14/2016 Continue to use various devices to help with her Organization such as notes, calendars and using her cell phone to set reminders. 2. Chronic Pain Syndrome: Refilled: Increased: Oxycodone 10 mg one tablet TID as needed as needed #80. Second script given for the following month. 2. Bilateral Pilon  Fractures: ContinueZanaflex and Ibuprofen. 12/14/2016 3. AnxietyPanic Attacks: Continue Klonopin. 12/14/2016 4. Tibial Nerve Injuries: Continue to monitor. 12/14/2016 5. Post traumatic Stress disorder: Awaiting on counseling. 12/14/2016 6. Muscle Spasm: Continue Tizanidine 7. Peripheral Neuropathy: RX: Gabapentin 100 mg HS: Call office in a week to evaluate, she verbalizes understanding. Instructions Given  30 minutes of face to face patient care time was spent during this visit. All questions were encouraged and answered.  F/U Visit in 2 months

## 2016-12-14 NOTE — Telephone Encounter (Signed)
On 12/14/2016 the [place] was reviewed no conflict was seen on the [place] with multiple prescribers. Monique [name] has a signed narcotic contract with our office. If there were any discrepancies this would have been reported to her physician.

## 2016-12-14 NOTE — Patient Instructions (Signed)
Gabapentin: Start Tonight One Capsule at [facility] office in One Week [phone]

## 2017-02-06 DIAGNOSIS — L910 Hypertrophic scar: Secondary | ICD-10-CM | POA: Diagnosis not present

## 2017-02-12 ENCOUNTER — Encounter: Payer: Medicare Other | Attending: Physical Medicine & Rehabilitation | Admitting: Physical Medicine & Rehabilitation

## 2017-02-12 ENCOUNTER — Encounter: Payer: Self-pay | Admitting: Physical Medicine & Rehabilitation

## 2017-02-12 VITALS — BP 114/73 | HR 84 | Resp 14

## 2017-02-12 DIAGNOSIS — F068 Other specified mental disorders due to known physiological condition: Secondary | ICD-10-CM | POA: Diagnosis not present

## 2017-02-12 DIAGNOSIS — Z9889 Other specified postprocedural states: Secondary | ICD-10-CM | POA: Insufficient documentation

## 2017-02-12 DIAGNOSIS — R51 Headache: Secondary | ICD-10-CM | POA: Insufficient documentation

## 2017-02-12 DIAGNOSIS — R42 Dizziness and giddiness: Secondary | ICD-10-CM | POA: Insufficient documentation

## 2017-02-12 DIAGNOSIS — Z5181 Encounter for therapeutic drug level monitoring: Secondary | ICD-10-CM | POA: Diagnosis not present

## 2017-02-12 DIAGNOSIS — S12100A Unspecified displaced fracture of second cervical vertebra, initial encounter for closed fracture: Secondary | ICD-10-CM | POA: Diagnosis not present

## 2017-02-12 DIAGNOSIS — R011 Cardiac murmur, unspecified: Secondary | ICD-10-CM | POA: Insufficient documentation

## 2017-02-12 DIAGNOSIS — S82892S Other fracture of left lower leg, sequela: Secondary | ICD-10-CM | POA: Diagnosis not present

## 2017-02-12 DIAGNOSIS — S82891S Other fracture of right lower leg, sequela: Secondary | ICD-10-CM

## 2017-02-12 DIAGNOSIS — S82871A Displaced pilon fracture of right tibia, initial encounter for closed fracture: Secondary | ICD-10-CM | POA: Insufficient documentation

## 2017-02-12 DIAGNOSIS — R410 Disorientation, unspecified: Secondary | ICD-10-CM | POA: Diagnosis not present

## 2017-02-12 DIAGNOSIS — S82872A Displaced pilon fracture of left tibia, initial encounter for closed fracture: Secondary | ICD-10-CM | POA: Insufficient documentation

## 2017-02-12 DIAGNOSIS — F329 Major depressive disorder, single episode, unspecified: Secondary | ICD-10-CM | POA: Diagnosis not present

## 2017-02-12 DIAGNOSIS — S060X9S Concussion with loss of consciousness of unspecified duration, sequela: Secondary | ICD-10-CM | POA: Diagnosis not present

## 2017-02-12 DIAGNOSIS — R252 Cramp and spasm: Secondary | ICD-10-CM | POA: Diagnosis not present

## 2017-02-12 DIAGNOSIS — Z79899 Other long term (current) drug therapy: Secondary | ICD-10-CM | POA: Insufficient documentation

## 2017-02-12 DIAGNOSIS — F419 Anxiety disorder, unspecified: Secondary | ICD-10-CM | POA: Insufficient documentation

## 2017-02-12 DIAGNOSIS — Z7722 Contact with and (suspected) exposure to environmental tobacco smoke (acute) (chronic): Secondary | ICD-10-CM | POA: Insufficient documentation

## 2017-02-12 DIAGNOSIS — D62 Acute posthemorrhagic anemia: Secondary | ICD-10-CM

## 2017-02-12 DIAGNOSIS — S069X0S Unspecified intracranial injury without loss of consciousness, sequela: Secondary | ICD-10-CM | POA: Diagnosis not present

## 2017-02-12 DIAGNOSIS — G894 Chronic pain syndrome: Secondary | ICD-10-CM

## 2017-02-12 DIAGNOSIS — S069X0A Unspecified intracranial injury without loss of consciousness, initial encounter: Secondary | ICD-10-CM | POA: Diagnosis not present

## 2017-02-12 DIAGNOSIS — G8929 Other chronic pain: Secondary | ICD-10-CM | POA: Diagnosis not present

## 2017-02-12 DIAGNOSIS — D649 Anemia, unspecified: Secondary | ICD-10-CM | POA: Diagnosis not present

## 2017-02-12 MED ORDER — CARBAMAZEPINE 200 MG PO TABS: 200.0000 mg | ORAL_TABLET | Freq: Two times a day (BID) | ORAL | 5 refills | Status: DC

## 2017-02-12 MED ORDER — OXYCODONE HCL 10 MG PO TABS: 10.0000 mg | ORAL_TABLET | Freq: Three times a day (TID) | ORAL | 0 refills | Status: DC | PRN

## 2017-02-12 NOTE — Progress Notes (Signed)
Subjective:    Patient ID: Hollee Fate, female    DOB: 04-09-1986, 31 y.o.   MRN: [id number]  HPI   Ms Woodhams is here in follow up of her TBI and chronic pain. She has been doing fairly for the most part. Her biggest complaint is constipation/incomplete bowel emptying. She is drinking a lot of water but not doing much else. She stopped miralax. She stopped the gabapentin due to nausea. It was helping somewhat.   She has a new baby girl and seems to be doing fairly well.   Her headaches have been under fair control. She uses the VPA when she has a severe h/a, not on a scheduled basis.    Pain Inventory Average Pain 6 Pain Right Now 6 My pain is constant, sharp, stabbing and aching  In the last 24 hours, has pain interfered with the following? General activity 9 Relation with others 9 Enjoyment of life 9 What TIME of day is your pain at its worst? all Sleep (in general) Fair  Pain is worse with: walking, bending, standing and some activites Pain improves with: rest, heat/ice, pacing activities and medication Relief from Meds: 8  Mobility walk without assistance ability to climb steps?  yes do you drive?  yes  Function disabled: date disabled . Do you have any goals in this area?  yes  Neuro/Psych No problems in this area  Prior Studies Any changes since last visit?  no  Physicians involved in your care Any changes since last visit?  no   Family History  Problem Relation Age of Onset  . Cancer Paternal Grandmother     Ovarian   Social History   Social History  . Marital status: Single    Spouse name: N/A  . Number of children: N/A  . Years of education: N/A   Social History Main Topics  . Smoking status: Passive Smoke Exposure - Never Smoker  . Smokeless tobacco: Never Used  . Alcohol use Yes     Comment: occasional  . Drug use: No  . Sexual activity: Yes   Other Topics Concern  . None   Social History Narrative  . None   Past  Surgical History:  Procedure Laterality Date  . APPENDECTOMY    . Ceasarean section  2007, 2010,2014  . EXTERNAL FIXATION LEG Bilateral 12/14/2012   Procedure: EXTERNAL FIXATION LEG;  Surgeon: [name], MD;  Location: [place];  Service: Orthopedics;  Laterality: Bilateral;  . EXTERNAL FIXATION REMOVAL Bilateral 12/23/2012   Procedure: REMOVAL EXTERNAL FIXATION LEG;  Surgeon: [name], MD;  Location: [place];  Service: Orthopedics;  Laterality: Bilateral;  . I&D EXTREMITY Left 12/14/2012   Procedure: IRRIGATION AND DEBRIDEMENT EXTREMITY;  Surgeon: [name], MD;  Location: [place];  Service: Orthopedics;  Laterality: Left;  . ORIF ANKLE FRACTURE Left 12/14/2012   Procedure: OPEN REDUCTION INTERNAL FIXATION (ORIF) ANKLE FRACTURE;  Surgeon: [name], MD;  Location: [place];  Service: Orthopedics;  Laterality: Left;  . ORIF TIBIA FRACTURE Bilateral 12/23/2012   Procedure: OPEN REDUCTION INTERNAL FIXATION (ORIF) BILATERAL DISTAL TIBIA FRACTURE;  Surgeon: [name], MD;  Location: [place];  Service: Orthopedics;  Laterality: Bilateral;   Past Medical History:  Diagnosis Date  . Anemia   . Anxiety   . Depression   . Heart murmur    BP 114/73 (BP Location: Right Arm, Patient Position: Sitting, Cuff Size: Large)   Pulse 84   Resp 14  SpO2 96%   Opioid Risk Score:   Fall Risk Score:  `1  Depression screen PHQ 2/9  Depression screen PHQ 2/9 12/24/2014  Decreased Interest 2  Down, Depressed, Hopeless 2  PHQ - 2 Score 4  Altered sleeping 3  Tired, decreased energy 3  Change in appetite 2  Feeling bad or failure about yourself  2  Trouble concentrating 3  Moving slowly or fidgety/restless 3  Suicidal thoughts 0  PHQ-9 Score 20    Review of Systems  Constitutional: Negative.   HENT: Negative.   Eyes: Negative.   Respiratory: Negative.   Cardiovascular: Negative.   Gastrointestinal: Negative.   Endocrine: Negative.   Genitourinary: Negative.   Musculoskeletal:  Negative.   Skin: Negative.   Allergic/Immunologic: Negative.   Neurological: Negative.   Hematological: Negative.   Psychiatric/Behavioral: Negative.   All other systems reviewed and are negative.      Objective:   Physical Exam   Constitutional: She is oriented to person, place, and time. She appears well-developedand well-nourished.  HENT:  Head: NCAT.  Neck: Normal range of motion. Neck supple.  Cardiovascular: RRR Pulmonary/Chest:normal effort  Musculoskeletal:  Strength nearly 5/5. Ankles are limited somewhat due to surgery and some discomfort.  Mild antalgia with gait Neurological: She is alertand oriented to person, place, and time.  Skin: Skin is warmand dry.  Psychiatric: anxious at times.    .       Assessment & Plan:  1.TBI: further education provided            -continue with routine as possible             -Continue with various devices to help with her Organization such as notes, calendars and using her cell phone to set reminders.  -she has made progress from a cognitive standpoint  2. Chronic Pain Syndrome: Refilled: Oxycodone 10 mg one tablet TID prn as needed as needed #80. 2nd script given for next month.  We will continue the opioid monitoring program, this consists of regular clinic visits, examinations, urine drug screen, pill counts as well as use of [place] Controlled Substance Reporting System. [place].  UDS today Bilateral Pilon Fractures: MaintainZanaflex and Ibuprofen 3. Panic Attacks: prn Klonopin 4. Tibial Nerve Injuries: Continue to monitor/stable  -tegretol trial for nerve associated pain 5. Post traumatic Stress disorder: klonopin as needed.                     -continue with stress mgt strategies  -shows signs of improvement  15 minutes of face to face patient care time was spent during this visit. All questions were encouraged and answered.  Follow up in 2 months.

## 2017-02-12 NOTE — Patient Instructions (Signed)
TEGRETOL  200MG  AT NIGHT FOR TWO WEEKS THEN INCREASE TO TWICE DAILY THEREAFTER IF YOUR NERVE PAIN/HEADACHES AREN'T ANY BETTER    WE CAN LOOK AT RESUMING RITALIN FOR SCHOOL/EDUCATION THIS FALL, BUT I WOULD TRY TO SIMULATE READING AND ATTENTION ACTIVITIES ON THE COMPUTER/VIA BOOKS BEFORE WE RESUME IT.

## 2017-02-16 LAB — TOXASSURE SELECT,+ANTIDEPR,UR

## 2017-02-19 ENCOUNTER — Telehealth: Payer: Self-pay | Admitting: *Deleted

## 2017-02-19 NOTE — Telephone Encounter (Signed)
Urine drug screen for this encounter is consistent for prescribed medication. She has not taken her clonazepam for a few weeks.

## 2017-04-15 ENCOUNTER — Encounter: Payer: Medicare Other | Attending: Physical Medicine & Rehabilitation | Admitting: Physical Medicine & Rehabilitation

## 2017-04-15 ENCOUNTER — Encounter: Payer: Self-pay | Admitting: Physical Medicine & Rehabilitation

## 2017-04-15 VITALS — BP 120/77 | HR 86

## 2017-04-15 DIAGNOSIS — S82891S Other fracture of right lower leg, sequela: Secondary | ICD-10-CM | POA: Diagnosis not present

## 2017-04-15 DIAGNOSIS — S069X0S Unspecified intracranial injury without loss of consciousness, sequela: Secondary | ICD-10-CM

## 2017-04-15 DIAGNOSIS — G894 Chronic pain syndrome: Secondary | ICD-10-CM

## 2017-04-15 DIAGNOSIS — Z9889 Other specified postprocedural states: Secondary | ICD-10-CM | POA: Diagnosis not present

## 2017-04-15 DIAGNOSIS — S82871A Displaced pilon fracture of right tibia, initial encounter for closed fracture: Secondary | ICD-10-CM | POA: Insufficient documentation

## 2017-04-15 DIAGNOSIS — S12191S Other nondisplaced fracture of second cervical vertebra, sequela: Secondary | ICD-10-CM

## 2017-04-15 DIAGNOSIS — Z79899 Other long term (current) drug therapy: Secondary | ICD-10-CM | POA: Insufficient documentation

## 2017-04-15 DIAGNOSIS — Z7722 Contact with and (suspected) exposure to environmental tobacco smoke (acute) (chronic): Secondary | ICD-10-CM | POA: Diagnosis not present

## 2017-04-15 DIAGNOSIS — F419 Anxiety disorder, unspecified: Secondary | ICD-10-CM | POA: Insufficient documentation

## 2017-04-15 DIAGNOSIS — S82892S Other fracture of left lower leg, sequela: Secondary | ICD-10-CM | POA: Diagnosis not present

## 2017-04-15 DIAGNOSIS — S12100A Unspecified displaced fracture of second cervical vertebra, initial encounter for closed fracture: Secondary | ICD-10-CM | POA: Diagnosis not present

## 2017-04-15 DIAGNOSIS — G8929 Other chronic pain: Secondary | ICD-10-CM | POA: Diagnosis not present

## 2017-04-15 DIAGNOSIS — R252 Cramp and spasm: Secondary | ICD-10-CM | POA: Diagnosis not present

## 2017-04-15 DIAGNOSIS — S069X0A Unspecified intracranial injury without loss of consciousness, initial encounter: Secondary | ICD-10-CM | POA: Insufficient documentation

## 2017-04-15 DIAGNOSIS — D649 Anemia, unspecified: Secondary | ICD-10-CM | POA: Diagnosis not present

## 2017-04-15 DIAGNOSIS — F068 Other specified mental disorders due to known physiological condition: Secondary | ICD-10-CM | POA: Diagnosis not present

## 2017-04-15 DIAGNOSIS — S82872A Displaced pilon fracture of left tibia, initial encounter for closed fracture: Secondary | ICD-10-CM | POA: Insufficient documentation

## 2017-04-15 DIAGNOSIS — R51 Headache: Secondary | ICD-10-CM | POA: Diagnosis not present

## 2017-04-15 DIAGNOSIS — R42 Dizziness and giddiness: Secondary | ICD-10-CM | POA: Insufficient documentation

## 2017-04-15 DIAGNOSIS — F329 Major depressive disorder, single episode, unspecified: Secondary | ICD-10-CM | POA: Diagnosis not present

## 2017-04-15 DIAGNOSIS — S060X9S Concussion with loss of consciousness of unspecified duration, sequela: Secondary | ICD-10-CM | POA: Diagnosis not present

## 2017-04-15 DIAGNOSIS — R410 Disorientation, unspecified: Secondary | ICD-10-CM | POA: Diagnosis not present

## 2017-04-15 DIAGNOSIS — R011 Cardiac murmur, unspecified: Secondary | ICD-10-CM | POA: Insufficient documentation

## 2017-04-15 MED ORDER — OXYCODONE HCL 10 MG PO TABS: 10.0000 mg | ORAL_TABLET | Freq: Three times a day (TID) | ORAL | 0 refills | Status: DC | PRN

## 2017-04-15 NOTE — Progress Notes (Signed)
Subjective:    Patient ID: Monique [name], female    DOB: 1986-03-31, 31 y.o.   MRN: [id number]  HPI  Monique [name] is here in follow up of her TBI and chronic pain. She has been doing fairly well at home. She is trying her best to manage stress. She has a lot on her hands with three kids, the youngest is 48 months. She has thought about education or vocational re-entry, and would like to pursue at some point.   She is using oxycodone for pain. She hasn't been taking much in the way of NSAID's. She is only taking the tegretol at night but she thinks it helps her headaches and nerve pain. She is no longer using klonopin. Sleep is reasonable.      Pain Inventory Average Pain 9 Pain Right Now 4 My pain is constant, sharp, stabbing and aching  In the last 24 hours, has pain interfered with the following? General activity 9 Relation with others 9 Enjoyment of life 9 What TIME of day is your pain at its worst? evening Sleep (in general) Fair  Pain is worse with: walking, bending, standing and some activites Pain improves with: rest and medication Relief from Meds: 8  Mobility walk without assistance  Function disabled: date disabled .  Neuro/Psych dizziness anxiety  Prior Studies Any changes since last visit?  no  Physicians involved in your care Any changes since last visit?  no   Family History  Problem Relation Age of Onset  . Cancer Paternal Grandmother        Ovarian   Social History   Social History  . Marital status: Single    Spouse name: N/A  . Number of children: N/A  . Years of education: N/A   Social History Main Topics  . Smoking status: Passive Smoke Exposure - Never Smoker  . Smokeless tobacco: Never Used  . Alcohol use Yes     Comment: occasional  . Drug use: No  . Sexual activity: Yes   Other Topics Concern  . None   Social History Narrative  . None   Past Surgical History:  Procedure Laterality Date  . APPENDECTOMY    .  Ceasarean section  2007, 2010,2014  . EXTERNAL FIXATION LEG Bilateral 12/14/2012   Procedure: EXTERNAL FIXATION LEG;  Surgeon: [name], MD;  Location: [facility];  Service: Orthopedics;  Laterality: Bilateral;  . EXTERNAL FIXATION REMOVAL Bilateral 12/23/2012   Procedure: REMOVAL EXTERNAL FIXATION LEG;  Surgeon: [name], MD;  Location: [place];  Service: Orthopedics;  Laterality: Bilateral;  . I&D EXTREMITY Left 12/14/2012   Procedure: IRRIGATION AND DEBRIDEMENT EXTREMITY;  Surgeon: [name], MD;  Location: [facility] [place];  Service: Orthopedics;  Laterality: Left;  . ORIF ANKLE FRACTURE Left 12/14/2012   Procedure: OPEN REDUCTION INTERNAL FIXATION (ORIF) ANKLE FRACTURE;  Surgeon: [name], MD;  Location: [place];  Service: Orthopedics;  Laterality: Left;  . ORIF TIBIA FRACTURE Bilateral 12/23/2012   Procedure: OPEN REDUCTION INTERNAL FIXATION (ORIF) BILATERAL DISTAL TIBIA FRACTURE;  Surgeon: [name], MD;  Location: [place];  Service: Orthopedics;  Laterality: Bilateral;   Past Medical History:  Diagnosis Date  . Anemia   . Anxiety   . Depression   . Heart murmur    BP 120/77   Pulse 86   SpO2 99%   Opioid Risk Score:   Fall Risk Score:  `1  Depression screen PHQ 2/9  Depression screen [facility] 2/9 12/24/2014  Decreased  Interest 2  Down, Depressed, Hopeless 2  PHQ - 2 Score 4  Altered sleeping 3  Tired, decreased energy 3  Change in appetite 2  Feeling bad or failure about yourself  2  Trouble concentrating 3  Moving slowly or fidgety/restless 3  Suicidal thoughts 0  PHQ-9 Score 20     Review of Systems  Constitutional: Positive for chills and fever.  HENT: Negative.   Eyes: Negative.   Respiratory: Negative.   Cardiovascular: Negative.   Gastrointestinal: Negative.   Endocrine: Negative.   Genitourinary: Negative.   Musculoskeletal: Negative.   Skin: Negative.   Allergic/Immunologic: Negative.   Neurological: Negative.   Hematological: Negative.     Psychiatric/Behavioral: Negative.   All other systems reviewed and are negative.      Objective:   Physical Exam  Constitutional: She is oriented to person, place, and time. She appears well-developedand well-nourished.  HENT:  Head: NCAT.  Neck: Normal range of motion. Neck supple.  Cardiovascular: RRR Pulmonary/Chest:normal effort Musculoskeletal:  Strength almost 5/5. Ankles are limited somewhat due to surgery and some discomfort.  Mild antalgia with gait Neurological: She is alertand oriented to person, place, and time. Remains distractible. Can be redirected.  Skin: Skin is warmand dry.  Psychiatric: anxious at times but pleasant.  .       Assessment & Plan:  1.TBI: further education provided -continue with routine as possible  -She shouldn't bite off more than she can chew however.  2. Chronic Pain Syndrome: Refilled: Oxycodone 10 mg one tablet TID prn as needed as needed #80. 2ndscript given for next month.   Bilateral Pilon Fractures: MaintainZanaflex and Ibuprofen 3. Panic Attacks: prnKlonopin 4. Tibial Nerve Injuries/post-traumatic headache:              -tegretol trial for nerve associated pain--encouraged her to increase to 200mg  BID 5. Post traumatic Stress disorder: klonopin as needed. -continue with stress mgt strategies             -shows signs of improvement  15 minutes of face to face patient care time was spent during this visit. All questions were encouraged and answered.  Follow up in 2 months.

## 2017-04-15 NOTE — Patient Instructions (Signed)
PLEASE FEEL FREE TO CALL OUR OFFICE WITH ANY PROBLEMS OR QUESTIONS ([phone])     INCREASE TEGRETOL TO 200MG  BID.

## 2017-04-17 DIAGNOSIS — L910 Hypertrophic scar: Secondary | ICD-10-CM | POA: Diagnosis not present

## 2017-05-21 ENCOUNTER — Telehealth: Payer: Self-pay | Admitting: *Deleted

## 2017-05-21 NOTE — Telephone Encounter (Signed)
Monique [name] called to ask what medications were on her list.  She is having issues with her neck.  She doesn't feel like it is due to sleep.   I reviewed medicine.  She was asking about meloxicam but she is taking ibuprofen.  I suggested she try the zanaflex and see if that helps.  If not call us back.

## 2017-06-14 ENCOUNTER — Telehealth: Payer: Self-pay

## 2017-06-14 ENCOUNTER — Telehealth: Payer: Self-pay | Admitting: Registered Nurse

## 2017-06-14 DIAGNOSIS — S060X9S Concussion with loss of consciousness of unspecified duration, sequela: Secondary | ICD-10-CM

## 2017-06-14 DIAGNOSIS — S82892S Other fracture of left lower leg, sequela: Principal | ICD-10-CM

## 2017-06-14 DIAGNOSIS — G894 Chronic pain syndrome: Secondary | ICD-10-CM

## 2017-06-14 DIAGNOSIS — S82891S Other fracture of right lower leg, sequela: Secondary | ICD-10-CM

## 2017-06-14 MED ORDER — OXYCODONE HCL 10 MG PO TABS
10.0000 mg | ORAL_TABLET | Freq: Three times a day (TID) | ORAL | 0 refills | Status: DC | PRN
Start: 2017-06-14 — End: 2017-07-15

## 2017-06-14 NOTE — Telephone Encounter (Signed)
Patient called requesting a refill of oxycodone medication, last fill according to [facility] was 05/14/17, and next scheduled appointment with Dr. Naaman Plummer is on 06/17/17

## 2017-06-14 NOTE — Telephone Encounter (Signed)
Printed prescription and handed to Monique [name] to sign, will call patient for pick up of med once signed

## 2017-06-14 NOTE — Telephone Encounter (Signed)
Placed a call to Ms. Hoaglund,  she can pick up her oxycodone prescription. Her mother Ms. Joesphine Bare may pick up the prescription, Ms. Delawder realizes her mother must have her driver license or ID. She verbalizes understanding.  According to [facility] her Oxycodone prescription was picked up on 05/14/2017, she has an appointment with Dr. Naaman Plummer on 06/17/2017.

## 2017-06-17 ENCOUNTER — Encounter: Payer: Medicare Other | Admitting: Physical Medicine & Rehabilitation

## 2017-07-15 ENCOUNTER — Encounter: Payer: Medicare Other | Attending: Physical Medicine & Rehabilitation | Admitting: Registered Nurse

## 2017-07-15 VITALS — BP 108/55 | HR 72

## 2017-07-15 DIAGNOSIS — S82891S Other fracture of right lower leg, sequela: Secondary | ICD-10-CM | POA: Diagnosis not present

## 2017-07-15 DIAGNOSIS — R51 Headache: Secondary | ICD-10-CM | POA: Insufficient documentation

## 2017-07-15 DIAGNOSIS — R011 Cardiac murmur, unspecified: Secondary | ICD-10-CM | POA: Diagnosis not present

## 2017-07-15 DIAGNOSIS — F329 Major depressive disorder, single episode, unspecified: Secondary | ICD-10-CM | POA: Insufficient documentation

## 2017-07-15 DIAGNOSIS — Z5181 Encounter for therapeutic drug level monitoring: Secondary | ICD-10-CM | POA: Diagnosis not present

## 2017-07-15 DIAGNOSIS — Z79899 Other long term (current) drug therapy: Secondary | ICD-10-CM | POA: Diagnosis not present

## 2017-07-15 DIAGNOSIS — G8929 Other chronic pain: Secondary | ICD-10-CM | POA: Diagnosis not present

## 2017-07-15 DIAGNOSIS — S060X9S Concussion with loss of consciousness of unspecified duration, sequela: Secondary | ICD-10-CM | POA: Diagnosis not present

## 2017-07-15 DIAGNOSIS — S069X0S Unspecified intracranial injury without loss of consciousness, sequela: Secondary | ICD-10-CM | POA: Diagnosis not present

## 2017-07-15 DIAGNOSIS — S82871A Displaced pilon fracture of right tibia, initial encounter for closed fracture: Secondary | ICD-10-CM | POA: Insufficient documentation

## 2017-07-15 DIAGNOSIS — F068 Other specified mental disorders due to known physiological condition: Secondary | ICD-10-CM

## 2017-07-15 DIAGNOSIS — M62838 Other muscle spasm: Secondary | ICD-10-CM

## 2017-07-15 DIAGNOSIS — S82872A Displaced pilon fracture of left tibia, initial encounter for closed fracture: Secondary | ICD-10-CM | POA: Diagnosis not present

## 2017-07-15 DIAGNOSIS — S069X0A Unspecified intracranial injury without loss of consciousness, initial encounter: Secondary | ICD-10-CM | POA: Diagnosis not present

## 2017-07-15 DIAGNOSIS — R42 Dizziness and giddiness: Secondary | ICD-10-CM | POA: Diagnosis not present

## 2017-07-15 DIAGNOSIS — F419 Anxiety disorder, unspecified: Secondary | ICD-10-CM | POA: Insufficient documentation

## 2017-07-15 DIAGNOSIS — Z9889 Other specified postprocedural states: Secondary | ICD-10-CM | POA: Diagnosis not present

## 2017-07-15 DIAGNOSIS — S12100A Unspecified displaced fracture of second cervical vertebra, initial encounter for closed fracture: Secondary | ICD-10-CM | POA: Insufficient documentation

## 2017-07-15 DIAGNOSIS — R410 Disorientation, unspecified: Secondary | ICD-10-CM | POA: Insufficient documentation

## 2017-07-15 DIAGNOSIS — S82892S Other fracture of left lower leg, sequela: Secondary | ICD-10-CM | POA: Diagnosis not present

## 2017-07-15 DIAGNOSIS — F411 Generalized anxiety disorder: Secondary | ICD-10-CM | POA: Diagnosis not present

## 2017-07-15 DIAGNOSIS — D649 Anemia, unspecified: Secondary | ICD-10-CM | POA: Diagnosis not present

## 2017-07-15 DIAGNOSIS — G894 Chronic pain syndrome: Secondary | ICD-10-CM | POA: Diagnosis not present

## 2017-07-15 DIAGNOSIS — R252 Cramp and spasm: Secondary | ICD-10-CM | POA: Diagnosis not present

## 2017-07-15 DIAGNOSIS — Z7722 Contact with and (suspected) exposure to environmental tobacco smoke (acute) (chronic): Secondary | ICD-10-CM | POA: Insufficient documentation

## 2017-07-15 MED ORDER — CLONAZEPAM 0.5 MG PO TABS: 0.5000 mg | ORAL_TABLET | Freq: Every evening | ORAL | 1 refills | Status: DC | PRN

## 2017-07-15 MED ORDER — OXYCODONE HCL 10 MG PO TABS: 10.0000 mg | ORAL_TABLET | Freq: Three times a day (TID) | ORAL | 0 refills | Status: DC | PRN

## 2017-07-15 NOTE — Progress Notes (Signed)
Subjective:    Patient ID: Monique [name], female    DOB: 11-Sep-1986, 31 y.o.   MRN: [id number]  HPI: [name] is a 31 year old female who returns for follow up appointment for chronic pain and medication refill. She states her pain is located in her bilateral lower extremities and bilateral feet. She rates her pain 4. Her current exercise regime is walking. Also reports she has been experience increase anxiety with  family issues. Discussed other methods for controlling anxiety such as  leisure skills, family, spiritual, meditation, she verbalizes understanding.   PMP Aware Web-site reviewed, all questions answered.   Ms. [name] Morphine equivalent is 44.44 MME.  Sheis also prescribed Klonopin. We have discussed the black box warning of using opioids and benzodiazepines I highlighted the dangers of using these drugs together and discussed the adverse events including respiratory suppression, overdose, cognitive impairment and importance of  compliance with current regimen. She verbalizes understanding, we will continue to monitor and adjust as indicated.     Last UDS was performed on 02/12/2017 it was consistent.    Pain Inventory Average Pain 9 Pain Right Now 4 My pain is constant, sharp, tingling and aching  In the last 24 hours, has pain interfered with the following? General activity 9 Relation with others 9 Enjoyment of life 9 What TIME of day is your pain at its worst? all Sleep (in general) Fair  Pain is worse with: walking, standing and some activites Pain improves with: rest, pacing activities and medication Relief from Meds: 8  Mobility walk without assistance how many minutes can you walk? 10  Function disabled: date disabled . Do you have any goals in this area?  yes  Neuro/Psych numbness tingling spasms confusion anxiety  Prior Studies Any changes since last visit?  no  Physicians involved in your care Any changes since last  visit?  no   Family History  Problem Relation Age of Onset  . Cancer Paternal Grandmother        Ovarian   Social History   Social History  . Marital status: Single    Spouse name: N/A  . Number of children: N/A  . Years of education: N/A   Social History Main Topics  . Smoking status: Passive Smoke Exposure - Never Smoker  . Smokeless tobacco: Never Used  . Alcohol use Yes     Comment: occasional  . Drug use: No  . Sexual activity: Yes   Other Topics Concern  . Not on file   Social History Narrative  . No narrative on file   Past Surgical History:  Procedure Laterality Date  . APPENDECTOMY    . Ceasarean section  2007, 2010,2014  . EXTERNAL FIXATION LEG Bilateral 12/14/2012   Procedure: EXTERNAL FIXATION LEG;  Surgeon: [name], MD;  Location: [place];  Service: Orthopedics;  Laterality: Bilateral;  . EXTERNAL FIXATION REMOVAL Bilateral 12/23/2012   Procedure: REMOVAL EXTERNAL FIXATION LEG;  Surgeon: [name], MD;  Location: [place];  Service: Orthopedics;  Laterality: Bilateral;  . I&D EXTREMITY Left 12/14/2012   Procedure: IRRIGATION AND DEBRIDEMENT EXTREMITY;  Surgeon: [name], MD;  Location: [place];  Service: Orthopedics;  Laterality: Left;  . ORIF ANKLE FRACTURE Left 12/14/2012   Procedure: OPEN REDUCTION INTERNAL FIXATION (ORIF) ANKLE FRACTURE;  Surgeon: [name], MD;  Location: [place];  Service: Orthopedics;  Laterality: Left;  . ORIF TIBIA FRACTURE Bilateral 12/23/2012   Procedure: OPEN REDUCTION INTERNAL FIXATION (ORIF)  BILATERAL DISTAL TIBIA FRACTURE;  Surgeon: [name], MD;  Location: [place];  Service: Orthopedics;  Laterality: Bilateral;   Past Medical History:  Diagnosis Date  . Anemia   . Anxiety   . Depression   . Heart murmur    There were no vitals taken for this visit.  Opioid Risk Score:1   Fall Risk Score:  `1  Depression screen PHQ 2/9  Depression screen [facility] 2/9 07/15/2017 12/24/2014  Decreased Interest 0 2  Down,  Depressed, Hopeless 0 2  PHQ - 2 Score 0 4  Altered sleeping - 3  Tired, decreased energy - 3  Change in appetite - 2  Feeling bad or failure about yourself  - 2  Trouble concentrating - 3  Moving slowly or fidgety/restless - 3  Suicidal thoughts - 0  PHQ-9 Score - 20    Review of Systems  Constitutional: Negative.   HENT: Negative.   Eyes: Negative.   Respiratory: Negative.   Cardiovascular: Negative.   Gastrointestinal: Negative.   Endocrine: Negative.   Genitourinary: Negative.   Musculoskeletal:       Spasms  Skin: Negative.   Allergic/Immunologic: Negative.   Neurological: Positive for numbness.       Tingling  Hematological: Negative.   Psychiatric/Behavioral: Positive for confusion. The patient is nervous/anxious.   All other systems reviewed and are negative.      Objective:   Physical Exam  Constitutional: She is oriented to person, place, and time. She appears well-developed and well-nourished.  HENT:  Head: Normocephalic and atraumatic.  Neck: Normal range of motion. Neck supple.  Cardiovascular: Normal rate and regular rhythm.   Pulmonary/Chest: Effort normal and breath sounds normal.  Musculoskeletal:  Normal Muscle Bulk and Muscle Testing Reveals: Upper Extremities: Full ROM and Muscle Strength 5/5 Lower Extremities: Full  ROM and Muscle Strength 5/5 Arises from chair with ease Narrow Based Gait  Neurological: She is alert and oriented to person, place, and time.  Skin: Skin is warm and dry.  Psychiatric: She has a normal mood and affect.  Nursing note and vitals reviewed.         Assessment & Plan:  1.TBI:Continue to Monitor. 07/15/2017 Continue to use various devices to help with her Organization such as notes, calendars and using her cell phone to set reminders. 2. Chronic Pain Syndrome: Refilled:  Oxycodone 10 mg one tablet TID as needed as needed #80. Second script given for the following month. 07/15/2017 3. Bilateral Pilon Fractures:  ContinueZanaflex and Ibuprofen. 07/15/2017 4. AnxietyPanic Attacks: RX: Klonopin. 07/15/2017 5. Tibial Nerve Injuries: Continue to monitor. 07/15/2017 6. Post traumatic Stress disorder:Continue Klonopin as needed and Continue to Monitor. 07/15/2017 7. Muscle Spasm: Continue Tizanidine. 07/15/2017   30  minutes of face to face patient care time was spent during this visit. All questions were encouraged and answered.  F/U Visit in 2 months

## 2017-07-16 ENCOUNTER — Encounter: Payer: Medicare Other | Admitting: Physical Medicine & Rehabilitation

## 2017-07-17 ENCOUNTER — Encounter: Payer: Self-pay | Admitting: Registered Nurse

## 2017-07-17 ENCOUNTER — Telehealth: Payer: Self-pay | Admitting: Registered Nurse

## 2017-07-17 NOTE — Telephone Encounter (Signed)
On 07/17/2017 the  [place] was reviewed no conflict was seen on the [place] with multiple prescribers. Ms. Jollie has a signed narcotic contract with our office. If there were any discrepancies this would have been reported to her physician.

## 2017-07-22 DIAGNOSIS — L910 Hypertrophic scar: Secondary | ICD-10-CM | POA: Diagnosis not present

## 2017-08-20 ENCOUNTER — Telehealth: Payer: Self-pay | Admitting: *Deleted

## 2017-08-20 NOTE — Telephone Encounter (Signed)
Patient just got back from vacation 08/19/2017.  Somewhere along the way she lost her Rx container.  She believes it happened during TSA screening, not certain though. She is asking for a call back from [facility] to see if something could be done to cover her until her upcoming appointment with Danella Sensing 09/10/2017. Please call patient

## 2017-08-21 ENCOUNTER — Telehealth: Payer: Self-pay | Admitting: Registered Nurse

## 2017-08-21 NOTE — Telephone Encounter (Signed)
Attempted to call Ms. [facility], she stated on the message she lost her medication while on vacation, according to the  PMP Aware site she picked up her Oxycodone on 08/14/2017. Left message to retrun the call. Her MME is 40.00.

## 2017-08-21 NOTE — Telephone Encounter (Signed)
Placed a call to Ms. Donaway, unable to leave message, awaiting return call.

## 2017-08-21 NOTE — Telephone Encounter (Signed)
Patient left voicemail late yesterday would like for you to call her - no indication of what she needs - just left her #

## 2017-09-10 ENCOUNTER — Encounter: Payer: Medicare Other | Attending: Physical Medicine & Rehabilitation | Admitting: Registered Nurse

## 2017-09-10 ENCOUNTER — Encounter: Payer: Self-pay | Admitting: Registered Nurse

## 2017-09-10 VITALS — BP 117/72 | HR 83 | Resp 14

## 2017-09-10 DIAGNOSIS — F411 Generalized anxiety disorder: Secondary | ICD-10-CM | POA: Diagnosis not present

## 2017-09-10 DIAGNOSIS — G8929 Other chronic pain: Secondary | ICD-10-CM | POA: Insufficient documentation

## 2017-09-10 DIAGNOSIS — S069X0A Unspecified intracranial injury without loss of consciousness, initial encounter: Secondary | ICD-10-CM | POA: Insufficient documentation

## 2017-09-10 DIAGNOSIS — Z7722 Contact with and (suspected) exposure to environmental tobacco smoke (acute) (chronic): Secondary | ICD-10-CM | POA: Diagnosis not present

## 2017-09-10 DIAGNOSIS — Z9889 Other specified postprocedural states: Secondary | ICD-10-CM | POA: Diagnosis not present

## 2017-09-10 DIAGNOSIS — F419 Anxiety disorder, unspecified: Secondary | ICD-10-CM | POA: Diagnosis not present

## 2017-09-10 DIAGNOSIS — F068 Other specified mental disorders due to known physiological condition: Secondary | ICD-10-CM

## 2017-09-10 DIAGNOSIS — S82872A Displaced pilon fracture of left tibia, initial encounter for closed fracture: Secondary | ICD-10-CM | POA: Insufficient documentation

## 2017-09-10 DIAGNOSIS — D649 Anemia, unspecified: Secondary | ICD-10-CM | POA: Diagnosis not present

## 2017-09-10 DIAGNOSIS — R51 Headache: Secondary | ICD-10-CM | POA: Diagnosis not present

## 2017-09-10 DIAGNOSIS — Z5181 Encounter for therapeutic drug level monitoring: Secondary | ICD-10-CM

## 2017-09-10 DIAGNOSIS — S82892S Other fracture of left lower leg, sequela: Secondary | ICD-10-CM | POA: Diagnosis not present

## 2017-09-10 DIAGNOSIS — R42 Dizziness and giddiness: Secondary | ICD-10-CM | POA: Insufficient documentation

## 2017-09-10 DIAGNOSIS — S82871A Displaced pilon fracture of right tibia, initial encounter for closed fracture: Secondary | ICD-10-CM | POA: Diagnosis not present

## 2017-09-10 DIAGNOSIS — G894 Chronic pain syndrome: Secondary | ICD-10-CM | POA: Diagnosis not present

## 2017-09-10 DIAGNOSIS — S069X0S Unspecified intracranial injury without loss of consciousness, sequela: Secondary | ICD-10-CM

## 2017-09-10 DIAGNOSIS — S12100A Unspecified displaced fracture of second cervical vertebra, initial encounter for closed fracture: Secondary | ICD-10-CM | POA: Insufficient documentation

## 2017-09-10 DIAGNOSIS — S82891S Other fracture of right lower leg, sequela: Secondary | ICD-10-CM

## 2017-09-10 DIAGNOSIS — R252 Cramp and spasm: Secondary | ICD-10-CM | POA: Insufficient documentation

## 2017-09-10 DIAGNOSIS — F329 Major depressive disorder, single episode, unspecified: Secondary | ICD-10-CM | POA: Diagnosis not present

## 2017-09-10 DIAGNOSIS — S060X9S Concussion with loss of consciousness of unspecified duration, sequela: Secondary | ICD-10-CM

## 2017-09-10 DIAGNOSIS — R410 Disorientation, unspecified: Secondary | ICD-10-CM | POA: Insufficient documentation

## 2017-09-10 DIAGNOSIS — R011 Cardiac murmur, unspecified: Secondary | ICD-10-CM | POA: Diagnosis not present

## 2017-09-10 DIAGNOSIS — M545 Low back pain, unspecified: Secondary | ICD-10-CM

## 2017-09-10 DIAGNOSIS — Z79899 Other long term (current) drug therapy: Secondary | ICD-10-CM

## 2017-09-10 DIAGNOSIS — M62838 Other muscle spasm: Secondary | ICD-10-CM | POA: Diagnosis not present

## 2017-09-10 MED ORDER — OXYCODONE HCL 10 MG PO TABS: 10.0000 mg | ORAL_TABLET | Freq: Three times a day (TID) | ORAL | 0 refills | Status: DC | PRN

## 2017-09-10 NOTE — Progress Notes (Signed)
Subjective:    Patient ID: Monique [name], female    DOB: 1985-12-23, 31 y.o.   MRN: [id number]  HPI: Monique [name] is a 31 year old female who returns for follow up appointment for chronic pain and medication refill. She states her pain is located in her lower back and and bilateral feet.  Also reports she fell  a few weeks ago when she was on vacation, she states she states she was in the shower. Also reports she can't remember if she hit her left great toe. Complaining of pain in her Left Great Toe, her Left foot is edematous and has tenderness with palpation on her left great Toe. She refuses X-ray at this time and states she will F/U with her PCP.  She rates her pain 9. Her current exercise regime is walking  [name] called office last month when her medication was lost during traveling, she picked up her Oxycodone on 08/14/2017. PMP Aware Site reviewed, we will allow a early refill today, spoke with the Pharmacist.   Monique [name] Morphine equivalent is 40.00 MME.  She is also prescribed Klonopin. We have reviewed the black box warning again regarding using opioids and benzodiazepines. I highlighted the dangers of using these drugs together and discussed the adverse events including respiratory suppression, overdose, cognitive impairment and importance of  compliance with current regimen. She verbalizes understanding, we will continue to monitor and adjust as indicated.     Last UDS was performed on 02/12/2017 it was consistent.    Pain Inventory Average Pain 9 Pain Right Now 9 My pain is constant, sharp, stabbing, tingling and aching  In the last 24 hours, has pain interfered with the following? General activity 10 Relation with others 10 Enjoyment of life 10 What TIME of day is your pain at its worst? all Sleep (in general) Fair  Pain is worse with: walking, bending, sitting, standing and some activites Pain improves with: nothing Relief from Meds:  0  Mobility walk without assistance ability to climb steps?  yes do you drive?  yes Do you have any goals in this area?  no  Function disabled: date disabled . Do you have any goals in this area?  no  Neuro/Psych numbness tingling trouble walking spasms dizziness confusion anxiety  Prior Studies Any changes since last visit?  no  Physicians involved in your care Any changes since last visit?  no   Family History  Problem Relation Age of Onset  . Cancer Paternal Grandmother        Ovarian   Social History   Socioeconomic History  . Marital status: Single    Spouse name: None  . Number of children: None  . Years of education: None  . Highest education level: None  Social Needs  . Financial resource strain: None  . Food insecurity - worry: None  . Food insecurity - inability: None  . Transportation needs - medical: None  . Transportation needs - non-medical: None  Occupational History  . None  Tobacco Use  . Smoking status: Passive Smoke Exposure - Never Smoker  . Smokeless tobacco: Never Used  Substance and Sexual Activity  . Alcohol use: Yes    Comment: occasional  . Drug use: No  . Sexual activity: Yes  Other Topics Concern  . None  Social History Narrative  . None   Past Surgical History:  Procedure Laterality Date  . APPENDECTOMY    . Ceasarean section  2007, 2010,2014  . EXTERNAL  FIXATION LEG Bilateral 12/14/2012   Procedure: EXTERNAL FIXATION LEG;  Surgeon: [name], MD;  Location: [place];  Service: Orthopedics;  Laterality: Bilateral;  . EXTERNAL FIXATION REMOVAL Bilateral 12/23/2012   Procedure: REMOVAL EXTERNAL FIXATION LEG;  Surgeon: [name], MD;  Location: [place];  Service: Orthopedics;  Laterality: Bilateral;  . I&D EXTREMITY Left 12/14/2012   Procedure: IRRIGATION AND DEBRIDEMENT EXTREMITY;  Surgeon: [name], MD;  Location: [place];  Service: Orthopedics;  Laterality: Left;  . ORIF ANKLE FRACTURE Left 12/14/2012    Procedure: OPEN REDUCTION INTERNAL FIXATION (ORIF) ANKLE FRACTURE;  Surgeon: [name], MD;  Location: [place];  Service: Orthopedics;  Laterality: Left;  . ORIF TIBIA FRACTURE Bilateral 12/23/2012   Procedure: OPEN REDUCTION INTERNAL FIXATION (ORIF) BILATERAL DISTAL TIBIA FRACTURE;  Surgeon: [name], MD;  Location: [place];  Service: Orthopedics;  Laterality: Bilateral;   Past Medical History:  Diagnosis Date  . Anemia   . Anxiety   . Depression   . Heart murmur    BP 117/72   Pulse 83   Resp 14   SpO2 98%   Opioid Risk Score:1   Fall Risk Score:  `1  Depression screen PHQ 2/9  Depression screen [facility] 2/9 07/15/2017 12/24/2014  Decreased Interest 0 2  Down, Depressed, Hopeless 0 2  PHQ - 2 Score 0 4  Altered sleeping - 3  Tired, decreased energy - 3  Change in appetite - 2  Feeling bad or failure about yourself  - 2  Trouble concentrating - 3  Moving slowly or fidgety/restless - 3  Suicidal thoughts - 0  PHQ-9 Score - 20    Review of Systems  Constitutional: Negative.   HENT: Negative.   Eyes: Negative.   Respiratory: Negative.   Cardiovascular: Negative.   Gastrointestinal: Negative.   Endocrine: Negative.   Genitourinary: Negative.   Musculoskeletal: Positive for gait problem.       Spasms  Skin: Negative.   Allergic/Immunologic: Negative.   Neurological: Positive for numbness.       Tingling  Hematological: Negative.   Psychiatric/Behavioral: Positive for confusion. The patient is nervous/anxious.   All other systems reviewed and are negative.      Objective:   Physical Exam  Constitutional: She is oriented to person, place, and time. She appears well-developed and well-nourished.  HENT:  Head: Normocephalic and atraumatic.  Neck: Normal range of motion. Neck supple.  Cardiovascular: Normal rate and regular rhythm.  Pulmonary/Chest: Effort normal and breath sounds normal.  Musculoskeletal:  Normal Muscle Bulk and Muscle Testing Reveals: Upper  Extremities: Full ROM and Muscle Strength 5/5 Lumbar Paraspinal Tenderness: L-3-L-5 Lower Extremities: Full  ROM and Muscle Strength 5/5 Arises from chair with ease Narrow Based Gait  Neurological: She is alert and oriented to person, place, and time.  Skin: Skin is warm and dry.  Psychiatric: She has a normal mood and affect.  Nursing note and vitals reviewed.         Assessment & Plan:  1.TBI:Continue to Monitor. 09/10/2017 Continue using the various devices to help with your Organization such as notes, calendars and using her cell phone to set reminders. 2. Chronic Pain Syndrome: Refilled:  Oxycodone 10 mg one tablet TID as needed as needed #80. Second script given for the following month. 09/10/2017 3. Bilateral Pilon Fractures: ContinueZanaflex and Ibuprofen. 09/10/2017 4. AnxietyPanic Attacks: Continue: Klonopin. 09/10/2017 5. Tibial Nerve Injuries: Continue to monitor. 09/10/2017 6. Post traumatic Stress disorder:Continue Klonopin as  needed and Continue to Monitor. 09/10/2017 7. Muscle Spasm: Continue Tizanidine. 09/10/2017   20  minutes of face to face patient care time was spent during this visit. All questions were encouraged and answered.  F/U Visit in 2 months

## 2017-10-21 DIAGNOSIS — O26891 Other specified pregnancy related conditions, first trimester: Secondary | ICD-10-CM | POA: Diagnosis not present

## 2017-10-21 DIAGNOSIS — Z3201 Encounter for pregnancy test, result positive: Secondary | ICD-10-CM | POA: Diagnosis not present

## 2017-10-21 DIAGNOSIS — R109 Unspecified abdominal pain: Secondary | ICD-10-CM | POA: Diagnosis not present

## 2017-10-21 DIAGNOSIS — Z3490 Encounter for supervision of normal pregnancy, unspecified, unspecified trimester: Secondary | ICD-10-CM | POA: Diagnosis not present

## 2017-10-21 DIAGNOSIS — Z3A01 Less than 8 weeks gestation of pregnancy: Secondary | ICD-10-CM | POA: Diagnosis not present

## 2017-10-23 DIAGNOSIS — Z3A00 Weeks of gestation of pregnancy not specified: Secondary | ICD-10-CM | POA: Diagnosis not present

## 2017-10-23 DIAGNOSIS — O200 Threatened abortion: Secondary | ICD-10-CM | POA: Diagnosis not present

## 2017-10-29 DIAGNOSIS — O200 Threatened abortion: Secondary | ICD-10-CM | POA: Diagnosis not present

## 2017-10-29 DIAGNOSIS — O3680X0 Pregnancy with inconclusive fetal viability, not applicable or unspecified: Secondary | ICD-10-CM | POA: Diagnosis not present

## 2017-10-29 DIAGNOSIS — Z3A01 Less than 8 weeks gestation of pregnancy: Secondary | ICD-10-CM | POA: Diagnosis not present

## 2017-10-29 DIAGNOSIS — Z3689 Encounter for other specified antenatal screening: Secondary | ICD-10-CM | POA: Diagnosis not present

## 2017-11-11 ENCOUNTER — Encounter: Payer: Self-pay | Admitting: Registered Nurse

## 2017-11-11 ENCOUNTER — Encounter: Payer: Medicare Other | Attending: Physical Medicine & Rehabilitation | Admitting: Registered Nurse

## 2017-11-11 VITALS — BP 103/69 | HR 88

## 2017-11-11 DIAGNOSIS — S82871A Displaced pilon fracture of right tibia, initial encounter for closed fracture: Secondary | ICD-10-CM | POA: Insufficient documentation

## 2017-11-11 DIAGNOSIS — D649 Anemia, unspecified: Secondary | ICD-10-CM | POA: Insufficient documentation

## 2017-11-11 DIAGNOSIS — F068 Other specified mental disorders due to known physiological condition: Secondary | ICD-10-CM

## 2017-11-11 DIAGNOSIS — G894 Chronic pain syndrome: Secondary | ICD-10-CM | POA: Diagnosis not present

## 2017-11-11 DIAGNOSIS — S82892S Other fracture of left lower leg, sequela: Secondary | ICD-10-CM | POA: Diagnosis not present

## 2017-11-11 DIAGNOSIS — F411 Generalized anxiety disorder: Secondary | ICD-10-CM | POA: Diagnosis not present

## 2017-11-11 DIAGNOSIS — R42 Dizziness and giddiness: Secondary | ICD-10-CM | POA: Diagnosis not present

## 2017-11-11 DIAGNOSIS — S12100A Unspecified displaced fracture of second cervical vertebra, initial encounter for closed fracture: Secondary | ICD-10-CM | POA: Insufficient documentation

## 2017-11-11 DIAGNOSIS — S82891S Other fracture of right lower leg, sequela: Secondary | ICD-10-CM

## 2017-11-11 DIAGNOSIS — Z9889 Other specified postprocedural states: Secondary | ICD-10-CM | POA: Insufficient documentation

## 2017-11-11 DIAGNOSIS — S82872A Displaced pilon fracture of left tibia, initial encounter for closed fracture: Secondary | ICD-10-CM | POA: Diagnosis not present

## 2017-11-11 DIAGNOSIS — M62838 Other muscle spasm: Secondary | ICD-10-CM

## 2017-11-11 DIAGNOSIS — F419 Anxiety disorder, unspecified: Secondary | ICD-10-CM | POA: Insufficient documentation

## 2017-11-11 DIAGNOSIS — R410 Disorientation, unspecified: Secondary | ICD-10-CM | POA: Insufficient documentation

## 2017-11-11 DIAGNOSIS — R252 Cramp and spasm: Secondary | ICD-10-CM | POA: Diagnosis not present

## 2017-11-11 DIAGNOSIS — S069X0A Unspecified intracranial injury without loss of consciousness, initial encounter: Secondary | ICD-10-CM | POA: Insufficient documentation

## 2017-11-11 DIAGNOSIS — Z79899 Other long term (current) drug therapy: Secondary | ICD-10-CM | POA: Diagnosis not present

## 2017-11-11 DIAGNOSIS — Z5181 Encounter for therapeutic drug level monitoring: Secondary | ICD-10-CM | POA: Diagnosis not present

## 2017-11-11 DIAGNOSIS — R51 Headache: Secondary | ICD-10-CM | POA: Insufficient documentation

## 2017-11-11 DIAGNOSIS — R011 Cardiac murmur, unspecified: Secondary | ICD-10-CM | POA: Insufficient documentation

## 2017-11-11 DIAGNOSIS — F329 Major depressive disorder, single episode, unspecified: Secondary | ICD-10-CM | POA: Insufficient documentation

## 2017-11-11 DIAGNOSIS — G8929 Other chronic pain: Secondary | ICD-10-CM | POA: Diagnosis not present

## 2017-11-11 DIAGNOSIS — Z7722 Contact with and (suspected) exposure to environmental tobacco smoke (acute) (chronic): Secondary | ICD-10-CM | POA: Insufficient documentation

## 2017-11-11 DIAGNOSIS — S060X9S Concussion with loss of consciousness of unspecified duration, sequela: Secondary | ICD-10-CM | POA: Diagnosis not present

## 2017-11-11 DIAGNOSIS — S069X0S Unspecified intracranial injury without loss of consciousness, sequela: Secondary | ICD-10-CM

## 2017-11-11 MED ORDER — OXYCODONE HCL 10 MG PO TABS: 10.0000 mg | ORAL_TABLET | Freq: Three times a day (TID) | ORAL | 0 refills | Status: DC | PRN

## 2017-11-11 NOTE — Progress Notes (Signed)
Subjective:    Patient ID: Monique [name], female    DOB: 1985-12-18, 32 y.o.   MRN: [id number]  HPI: Monique [name] is a 32 year old female who returns for follow up appointment for chronic pain and medication refill.She states her pain is located in her lower back and right lower extremity. She rates her pain 7. Her current exercise regime is walking.   Monique [name] equivalent is 37.33. MME. She is also prescribed Klonopin, she using it sporadically. Last prescription filled on 08/14/2017. We have reviewed the black box warning again regarding using opioids and benzodiazepines. I highlighted the dangers of using these drugs together and discussed the adverse events including respiratory suppression, overdose, cognitive impairment and importance of  compliance with current regimen. She verbalizes understanding, we will continue to monitor and adjust as indicated.    Last UDS was performed on 02/12/2017 it was consistent. UDS was ordered today.   Pain Inventory Average Pain 9 Pain Right Now 7 My pain is constant, sharp, stabbing and aching  In the last 24 hours, has pain interfered with the following? General activity 9 Relation with others 9 Enjoyment of life 9 What TIME of day is your pain at its worst? all Sleep (in general) Fair  Pain is worse with: walking, bending, sitting, standing and some activites Pain improves with: rest, pacing activities and medication Relief from Meds: 9  Mobility walk without assistance Do you have any goals in this area?  no  Function disabled: date disabled . Do you have any goals in this area?  no  Neuro/Psych spasms dizziness confusion anxiety  Prior Studies Any changes since last visit?  no did not get toe checked from last fall  Physicians involved in your care Any changes since last visit?  no   Family History  Problem Relation Age of Onset  . Cancer Paternal Grandmother        Ovarian   Social History     Socioeconomic History  . Marital status: Single    Spouse name: None  . Number of children: None  . Years of education: None  . Highest education level: None  Social Needs  . Financial resource strain: None  . Food insecurity - worry: None  . Food insecurity - inability: None  . Transportation needs - medical: None  . Transportation needs - non-medical: None  Occupational History  . None  Tobacco Use  . Smoking status: Passive Smoke Exposure - Never Smoker  . Smokeless tobacco: Never Used  Substance and Sexual Activity  . Alcohol use: Yes    Comment: occasional  . Drug use: No  . Sexual activity: Yes  Other Topics Concern  . None  Social History Narrative  . None   Past Surgical History:  Procedure Laterality Date  . APPENDECTOMY    . Ceasarean section  2007, 2010,2014  . EXTERNAL FIXATION LEG Bilateral 12/14/2012   Procedure: EXTERNAL FIXATION LEG;  Surgeon: [name], MD;  Location: [place];  Service: Orthopedics;  Laterality: Bilateral;  . EXTERNAL FIXATION REMOVAL Bilateral 12/23/2012   Procedure: REMOVAL EXTERNAL FIXATION LEG;  Surgeon: [name], MD;  Location: [place];  Service: Orthopedics;  Laterality: Bilateral;  . I&D EXTREMITY Left 12/14/2012   Procedure: IRRIGATION AND DEBRIDEMENT EXTREMITY;  Surgeon: [name], MD;  Location: [place];  Service: Orthopedics;  Laterality: Left;  . ORIF ANKLE FRACTURE Left 12/14/2012   Procedure: OPEN REDUCTION INTERNAL FIXATION (ORIF) ANKLE FRACTURE;  Surgeon:  [name], MD;  Location: [place];  Service: Orthopedics;  Laterality: Left;  . ORIF TIBIA FRACTURE Bilateral 12/23/2012   Procedure: OPEN REDUCTION INTERNAL FIXATION (ORIF) BILATERAL DISTAL TIBIA FRACTURE;  Surgeon: [name], MD;  Location: [place];  Service: Orthopedics;  Laterality: Bilateral;   Past Medical History:  Diagnosis Date  . Anemia   . Anxiety   . Depression   . Heart murmur    BP 103/69   Pulse 88   SpO2 96%   Opioid Risk Score:1    Fall Risk Score:  `1  Depression screen PHQ 2/9  Depression screen [facility] 2/9 11/11/2017 07/15/2017 12/24/2014  Decreased Interest 0 0 2  Down, Depressed, Hopeless 0 0 2  PHQ - 2 Score 0 0 4  Altered sleeping - - 3  Tired, decreased energy - - 3  Change in appetite - - 2  Feeling bad or failure about yourself  - - 2  Trouble concentrating - - 3  Moving slowly or fidgety/restless - - 3  Suicidal thoughts - - 0  PHQ-9 Score - - 20    Review of Systems  Constitutional: Negative.   HENT: Negative.   Eyes: Negative.   Respiratory: Negative.   Cardiovascular: Negative.   Gastrointestinal: Negative.   Endocrine: Negative.   Genitourinary: Negative.   Musculoskeletal: Positive for gait problem.       Spasms  Skin: Negative.   Allergic/Immunologic: Negative.   Neurological: Positive for numbness.       Tingling  Hematological: Negative.   Psychiatric/Behavioral: Positive for confusion. The patient is nervous/anxious.   All other systems reviewed and are negative.      Objective:   Physical Exam  Constitutional: She is oriented to person, place, and time. She appears well-developed and well-nourished.  HENT:  Head: Normocephalic and atraumatic.  Neck: Normal range of motion. Neck supple.  Cardiovascular: Normal rate and regular rhythm.  Pulmonary/Chest: Effort normal and breath sounds normal.  Musculoskeletal:  Normal Muscle Bulk and Muscle Testing Reveals: Upper Extremities: Full ROM and Muscle Strength 5/5 Lower Extremities: FullROM and Muscle Strength 5/5 Right Lower Extremity Flexion Produces Pain into Extremity and Right Foot Left Lower Extremity Flexion Produces Pain into her toes Arises from chair with ease Narrow Based Gait  Neurological: She is alert and oriented to person, place, and time.  Skin: Skin is warm and dry.  Psychiatric: She has a normal mood and affect.  Nursing note and vitals reviewed.         Assessment & Plan:  1.TBI:Continue to Monitor.  11/11/2017 Continue using the various devices to help with your Organization such as notes, calendars and using her cell phone to set reminders. 2. Chronic Pain Syndrome: Refilled:  Oxycodone 10 mg one tablet TID as needed as needed #80. Second script given for the following month. 11/11/2017 3. Bilateral Pilon Fractures: ContinueZanaflex and Ibuprofen. 11/11/2017 4. AnxietyPanic Attacks: Continue: Klonopin. 11/11/2017 5. Tibial Nerve Injuries: Continue to monitor. 11/11/2017 6. Post traumatic Stress disorder:Continue Klonopin as needed and Continue to Monitor. 11/11/2017 7. Muscle Spasm: Continue Tizanidine. 11/11/2017   20  minutes of face to face patient care time was spent during this visit. All questions were encouraged and answered.  F/U Visit in 2 months

## 2017-11-15 LAB — TOXASSURE SELECT,+ANTIDEPR,UR

## 2017-11-19 ENCOUNTER — Telehealth: Payer: Self-pay | Admitting: *Deleted

## 2017-11-19 NOTE — Telephone Encounter (Signed)
Urine drug screen for this encounter is inconsistent for prescribed medication. Her prescribed oxycodone is present but there is also evidence of taking diazepam which is not a prescribed medication.  She is prescribed clonazepam but reported that had not been taken in the past month.

## 2017-11-19 NOTE — Telephone Encounter (Signed)
Placed a call to Monique [name] regarding her UDS results, no answer. Will try again.

## 2018-01-06 ENCOUNTER — Ambulatory Visit: Payer: Self-pay | Admitting: Registered Nurse

## 2018-01-07 ENCOUNTER — Other Ambulatory Visit: Payer: Self-pay

## 2018-01-07 ENCOUNTER — Encounter: Payer: Medicare Other | Attending: Physical Medicine & Rehabilitation | Admitting: Registered Nurse

## 2018-01-07 ENCOUNTER — Encounter: Payer: Self-pay | Admitting: Registered Nurse

## 2018-01-07 VITALS — BP 117/79 | HR 78 | Ht 65.0 in | Wt 217.0 lb

## 2018-01-07 DIAGNOSIS — R011 Cardiac murmur, unspecified: Secondary | ICD-10-CM | POA: Insufficient documentation

## 2018-01-07 DIAGNOSIS — Z79899 Other long term (current) drug therapy: Secondary | ICD-10-CM | POA: Diagnosis not present

## 2018-01-07 DIAGNOSIS — F411 Generalized anxiety disorder: Secondary | ICD-10-CM

## 2018-01-07 DIAGNOSIS — S060X9S Concussion with loss of consciousness of unspecified duration, sequela: Secondary | ICD-10-CM | POA: Diagnosis not present

## 2018-01-07 DIAGNOSIS — F419 Anxiety disorder, unspecified: Secondary | ICD-10-CM | POA: Insufficient documentation

## 2018-01-07 DIAGNOSIS — R252 Cramp and spasm: Secondary | ICD-10-CM | POA: Diagnosis not present

## 2018-01-07 DIAGNOSIS — Z9889 Other specified postprocedural states: Secondary | ICD-10-CM | POA: Insufficient documentation

## 2018-01-07 DIAGNOSIS — S82872A Displaced pilon fracture of left tibia, initial encounter for closed fracture: Secondary | ICD-10-CM | POA: Insufficient documentation

## 2018-01-07 DIAGNOSIS — R410 Disorientation, unspecified: Secondary | ICD-10-CM | POA: Insufficient documentation

## 2018-01-07 DIAGNOSIS — S069X0S Unspecified intracranial injury without loss of consciousness, sequela: Secondary | ICD-10-CM | POA: Diagnosis not present

## 2018-01-07 DIAGNOSIS — S12100A Unspecified displaced fracture of second cervical vertebra, initial encounter for closed fracture: Secondary | ICD-10-CM | POA: Diagnosis not present

## 2018-01-07 DIAGNOSIS — F329 Major depressive disorder, single episode, unspecified: Secondary | ICD-10-CM | POA: Diagnosis not present

## 2018-01-07 DIAGNOSIS — S069X0A Unspecified intracranial injury without loss of consciousness, initial encounter: Secondary | ICD-10-CM | POA: Diagnosis not present

## 2018-01-07 DIAGNOSIS — R42 Dizziness and giddiness: Secondary | ICD-10-CM | POA: Diagnosis not present

## 2018-01-07 DIAGNOSIS — Z5181 Encounter for therapeutic drug level monitoring: Secondary | ICD-10-CM

## 2018-01-07 DIAGNOSIS — G894 Chronic pain syndrome: Secondary | ICD-10-CM

## 2018-01-07 DIAGNOSIS — S82892S Other fracture of left lower leg, sequela: Secondary | ICD-10-CM | POA: Diagnosis not present

## 2018-01-07 DIAGNOSIS — S82871A Displaced pilon fracture of right tibia, initial encounter for closed fracture: Secondary | ICD-10-CM | POA: Diagnosis not present

## 2018-01-07 DIAGNOSIS — S82891S Other fracture of right lower leg, sequela: Secondary | ICD-10-CM

## 2018-01-07 DIAGNOSIS — D649 Anemia, unspecified: Secondary | ICD-10-CM | POA: Diagnosis not present

## 2018-01-07 DIAGNOSIS — F068 Other specified mental disorders due to known physiological condition: Secondary | ICD-10-CM | POA: Diagnosis not present

## 2018-01-07 DIAGNOSIS — G8929 Other chronic pain: Secondary | ICD-10-CM | POA: Diagnosis not present

## 2018-01-07 DIAGNOSIS — Z7722 Contact with and (suspected) exposure to environmental tobacco smoke (acute) (chronic): Secondary | ICD-10-CM | POA: Diagnosis not present

## 2018-01-07 DIAGNOSIS — M62838 Other muscle spasm: Secondary | ICD-10-CM

## 2018-01-07 DIAGNOSIS — R51 Headache: Secondary | ICD-10-CM | POA: Diagnosis not present

## 2018-01-07 MED ORDER — OXYCODONE HCL 10 MG PO TABS: 10.0000 mg | ORAL_TABLET | Freq: Three times a day (TID) | ORAL | 0 refills | Status: DC | PRN

## 2018-01-07 NOTE — Progress Notes (Signed)
Subjective:    Patient ID: Monique [name], female    DOB: Jun 02, 1986, 32 y.o.   MRN: [id number]  HPI: Monique. Monique [name] is a 32 year old female who returns for follow up appointment and medication refill. She states her pain is located in her right lower extremity and bilateral feet R>L.  Also reports increase intensity of pain in her right leg and bilateral feet denies falling,will obtain X-rays. Monique [name] would like to obtain X-rays in June, she states she is in the process of moving to [place]. She rates her pain 9.   Discuss Monique [name] last UDS result, she reports she had an abortion last week in January, she is adamant she didn't take anyone's medication. We will obtain a UDS today, she verbalizes understanding. Reviewed the Narcotic Policy.   Monique. [name] Morphine Equivalent is 40.00 MME. Monique. Bolanos last prescription of Klonopin was picked up on 08/14/2017, she states she's only using it sparingly. We have reviewed the black box warning regarding using opioids and benzodiazepines. I highlighted the dangers of using these drugs together and discussed the adverse events including respiratory suppression, overdose, cognitive impairment and importance of  compliance with current regimen. She verbalizes understanding, we will continue to monitor and adjust as indicated.     Pain Inventory Average Pain 9 Pain Right Now 9 My pain is constant, sharp, stabbing, tingling and aching  In the last 24 hours, has pain interfered with the following? General activity 9 Relation with others 9 Enjoyment of life 9 What TIME of day is your pain at its worst? morning daytime evening  Sleep (in general) Fair  Pain is worse with: walking, bending, sitting, standing and some activites Pain improves with: rest, therapy/exercise, pacing activities and medication Relief from Meds: 9  Mobility walk without assistance ability to climb steps?  yes do you drive?   yes  Function disabled: date disabled n/a Do you have any goals in this area?  no  Neuro/Psych weakness tingling spasms confusion anxiety  Prior Studies Any changes since last visit?  no  Physicians involved in your care Any changes since last visit?  no   Family History  Problem Relation Age of Onset  . Cancer Paternal Grandmother        Ovarian   Social History   Socioeconomic History  . Marital status: Single    Spouse name: Not on file  . Number of children: Not on file  . Years of education: Not on file  . Highest education level: Not on file  Occupational History  . Not on file  Social Needs  . Financial resource strain: Not on file  . Food insecurity:    Worry: Not on file    Inability: Not on file  . Transportation needs:    Medical: Not on file    Non-medical: Not on file  Tobacco Use  . Smoking status: Passive Smoke Exposure - Never Smoker  . Smokeless tobacco: Never Used  Substance and Sexual Activity  . Alcohol use: Yes    Comment: occasional  . Drug use: No  . Sexual activity: Yes  Lifestyle  . Physical activity:    Days per week: Not on file    Minutes per session: Not on file  . Stress: Not on file  Relationships  . Social connections:    Talks on phone: Not on file    Gets together: Not on file    Attends religious service: Not on file  Active member of club or organization: Not on file    Attends meetings of clubs or organizations: Not on file    Relationship status: Not on file  Other Topics Concern  . Not on file  Social History Narrative  . Not on file   Past Surgical History:  Procedure Laterality Date  . APPENDECTOMY    . Ceasarean section  2007, 2010,2014  . EXTERNAL FIXATION LEG Bilateral 12/14/2012   Procedure: EXTERNAL FIXATION LEG;  Surgeon: [name], MD;  Location: [place];  Service: Orthopedics;  Laterality: Bilateral;  . EXTERNAL FIXATION REMOVAL Bilateral 12/23/2012   Procedure: REMOVAL EXTERNAL FIXATION  LEG;  Surgeon: [name], MD;  Location: [place];  Service: Orthopedics;  Laterality: Bilateral;  . I&D EXTREMITY Left 12/14/2012   Procedure: IRRIGATION AND DEBRIDEMENT EXTREMITY;  Surgeon: [name], MD;  Location: [place];  Service: Orthopedics;  Laterality: Left;  . ORIF ANKLE FRACTURE Left 12/14/2012   Procedure: OPEN REDUCTION INTERNAL FIXATION (ORIF) ANKLE FRACTURE;  Surgeon: [name], MD;  Location: [place];  Service: Orthopedics;  Laterality: Left;  . ORIF TIBIA FRACTURE Bilateral 12/23/2012   Procedure: OPEN REDUCTION INTERNAL FIXATION (ORIF) BILATERAL DISTAL TIBIA FRACTURE;  Surgeon: [name], MD;  Location[place];  Service: Orthopedics;  Laterality: Bilateral;   Past Medical History:  Diagnosis Date  . Anemia   . Anxiety   . Depression   . Heart murmur    BP 117/79   Pulse 78   Ht 5\' 5"  (1.651 m) Comment: pt reported  Wt 217 lb (98.4 kg)   SpO2 97%   BMI 36.11 kg/m   Opioid Risk Score:   Fall Risk Score:  `1  Depression screen PHQ 2/9  Depression screen [facility] 2/9 11/11/2017 07/15/2017 12/24/2014  Decreased Interest 0 0 2  Down, Depressed, Hopeless 0 0 2  PHQ - 2 Score 0 0 4  Altered sleeping - - 3  Tired, decreased energy - - 3  Change in appetite - - 2  Feeling bad or failure about yourself  - - 2  Trouble concentrating - - 3  Moving slowly or fidgety/restless - - 3  Suicidal thoughts - - 0  PHQ-9 Score - - 20    Review of Systems  Constitutional: Negative.   HENT: Negative.   Eyes: Negative.   Respiratory: Negative.   Cardiovascular: Negative.   Gastrointestinal: Negative.   Endocrine: Negative.   Genitourinary: Negative.   Musculoskeletal: Negative.   Skin: Negative.   Allergic/Immunologic: Negative.   Neurological: Negative.   Hematological: Negative.   Psychiatric/Behavioral: Negative.   All other systems reviewed and are negative.      Objective:   Physical Exam  Constitutional: She is oriented to person, place, and time. She  appears well-developed and well-nourished.  HENT:  Head: Normocephalic and atraumatic.  Neck: Normal range of motion. Neck supple.  Cardiovascular: Normal rate and regular rhythm.  Pulmonary/Chest: Effort normal and breath sounds normal.  Musculoskeletal:  Normal Muscle Bulk and Muscle Testing Reveals: Upper Extremities: Full ROM and Muscle Strength 5/5 Lower Extremities: Full ROM and Muscle Strength 5/5 Arises from Table with ease Narrow Based Gait  Neurological: She is alert and oriented to person, place, and time.  Skin: Skin is warm and dry.  Psychiatric: She has a normal mood and affect.  Nursing note and vitals reviewed.         Assessment & Plan:  1.TBI:Continue to Monitor. 01/07/2018 Continue with current treatment:  using the various devices to help with your Organization such as notes, calendars and using her cell phone to set reminders. 2. Chronic Pain Syndrome: Refilled: Continue current treatment Oxycodone 10 mg one tablet TID as needed as needed #80. Second script e-scribe for the following month. 01/07/2018 3. Bilateral Pilon Fractures: ContinueZanaflex and Ibuprofen. 01/07/2018 4. AnxietyPanic Attacks: Monique. Mcclary Reports she's using Klonopin sparingly. 01/07/2018  5. Tibial Nerve Injuries: Continue to monitor. 01/09/2018 6. Post traumatic Stress disorde r:Continue Klonopin as needed and Continue to Monitor. 01/07/2018 7. Muscle Spasm: Continue current treatment with Tizanidine. 01/07/2018   20  minutes of face to face patient care time was spent during this visit. All questions were encouraged and answered.  F/U Visit in 2 months

## 2018-01-11 LAB — TOXASSURE SELECT,+ANTIDEPR,UR

## 2018-01-14 ENCOUNTER — Telehealth: Payer: Self-pay | Admitting: *Deleted

## 2018-01-14 NOTE — Telephone Encounter (Signed)
Urine drug screen for this encounter is consistent for prescribed medication 

## 2018-03-11 ENCOUNTER — Encounter: Payer: Medicare Other | Attending: Physical Medicine & Rehabilitation | Admitting: Physical Medicine & Rehabilitation

## 2018-03-11 ENCOUNTER — Encounter: Payer: Self-pay | Admitting: Physical Medicine & Rehabilitation

## 2018-03-11 VITALS — BP 116/57 | HR 82 | Ht 65.0 in | Wt 219.0 lb

## 2018-03-11 DIAGNOSIS — F068 Other specified mental disorders due to known physiological condition: Secondary | ICD-10-CM | POA: Diagnosis not present

## 2018-03-11 DIAGNOSIS — S069X0A Unspecified intracranial injury without loss of consciousness, initial encounter: Secondary | ICD-10-CM | POA: Diagnosis not present

## 2018-03-11 DIAGNOSIS — S82891S Other fracture of right lower leg, sequela: Secondary | ICD-10-CM

## 2018-03-11 DIAGNOSIS — G894 Chronic pain syndrome: Secondary | ICD-10-CM

## 2018-03-11 DIAGNOSIS — Z7722 Contact with and (suspected) exposure to environmental tobacco smoke (acute) (chronic): Secondary | ICD-10-CM | POA: Insufficient documentation

## 2018-03-11 DIAGNOSIS — R252 Cramp and spasm: Secondary | ICD-10-CM | POA: Insufficient documentation

## 2018-03-11 DIAGNOSIS — S069X0S Unspecified intracranial injury without loss of consciousness, sequela: Secondary | ICD-10-CM | POA: Diagnosis not present

## 2018-03-11 DIAGNOSIS — G8929 Other chronic pain: Secondary | ICD-10-CM | POA: Insufficient documentation

## 2018-03-11 DIAGNOSIS — G43009 Migraine without aura, not intractable, without status migrainosus: Secondary | ICD-10-CM

## 2018-03-11 DIAGNOSIS — Z9889 Other specified postprocedural states: Secondary | ICD-10-CM | POA: Diagnosis not present

## 2018-03-11 DIAGNOSIS — S82871A Displaced pilon fracture of right tibia, initial encounter for closed fracture: Secondary | ICD-10-CM | POA: Diagnosis not present

## 2018-03-11 DIAGNOSIS — R51 Headache: Secondary | ICD-10-CM | POA: Insufficient documentation

## 2018-03-11 DIAGNOSIS — S82872A Displaced pilon fracture of left tibia, initial encounter for closed fracture: Secondary | ICD-10-CM | POA: Diagnosis not present

## 2018-03-11 DIAGNOSIS — S12100A Unspecified displaced fracture of second cervical vertebra, initial encounter for closed fracture: Secondary | ICD-10-CM | POA: Diagnosis not present

## 2018-03-11 DIAGNOSIS — D649 Anemia, unspecified: Secondary | ICD-10-CM | POA: Diagnosis not present

## 2018-03-11 DIAGNOSIS — Z79899 Other long term (current) drug therapy: Secondary | ICD-10-CM | POA: Insufficient documentation

## 2018-03-11 DIAGNOSIS — R410 Disorientation, unspecified: Secondary | ICD-10-CM | POA: Diagnosis not present

## 2018-03-11 DIAGNOSIS — S060X9S Concussion with loss of consciousness of unspecified duration, sequela: Secondary | ICD-10-CM | POA: Diagnosis not present

## 2018-03-11 DIAGNOSIS — S82892S Other fracture of left lower leg, sequela: Secondary | ICD-10-CM

## 2018-03-11 DIAGNOSIS — R42 Dizziness and giddiness: Secondary | ICD-10-CM | POA: Diagnosis not present

## 2018-03-11 DIAGNOSIS — F329 Major depressive disorder, single episode, unspecified: Secondary | ICD-10-CM | POA: Insufficient documentation

## 2018-03-11 DIAGNOSIS — F419 Anxiety disorder, unspecified: Secondary | ICD-10-CM | POA: Diagnosis not present

## 2018-03-11 DIAGNOSIS — R011 Cardiac murmur, unspecified: Secondary | ICD-10-CM | POA: Insufficient documentation

## 2018-03-11 DIAGNOSIS — R4189 Other symptoms and signs involving cognitive functions and awareness: Secondary | ICD-10-CM

## 2018-03-11 MED ORDER — OXYCODONE HCL 10 MG PO TABS: 10.0000 mg | ORAL_TABLET | Freq: Three times a day (TID) | ORAL | 0 refills | Status: DC | PRN

## 2018-03-11 NOTE — Progress Notes (Signed)
Subjective:    Patient ID: Monique [name], female    DOB: 1985/12/26, 32 y.o.   MRN: [id number]  HPI   Monique [name] is here in follow up of her TBI and polytrauma, associated pain.   She reports that she's having ongoing headaches. They may happen once per week. She takes aleve which helps but takes an hour or two. Typically she will lay down and close her eyes which helps.  She is had headaches since her youth and they have persisted after her injury.  She is always used naproxen before.  She is no longer taking Tegretol which we have prescribed to help with these in the past.  For pain she is using oxycodone 10 mg 3 times daily as needed as well as Zanaflex for spasms.  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  She has interest in returning to the workforce and asked me with the possibility of this was.  Her mood is generally much improved.  Her anxiety has lessened.  She is minimally using Klonopin at this point.  She does find that her anxiety does get the best for when she is fatigued and she can be irritable at times.  Her sleep is still inconsistent but has improved overall..   Pain Inventory Average Pain 9 Pain Right Now 5 My pain is constant, sharp, stabbing and aching  In the last 24 hours, has pain interfered with the following? General activity 9 Relation with others 9 Enjoyment of life 9 What TIME of day is your pain at its worst? all Sleep (in general) Fair  Pain is worse with: walking, standing and some activites Pain improves with: rest, heat/ice, therapy/exercise, pacing activities and medication Relief from Meds: 9  Mobility walk without assistance ability to climb steps?  yes do you drive?  yes  Function disabled: date disabled .  Neuro/Psych weakness numbness tingling spasms anxiety  Prior Studies Any changes since last visit?  no  Physicians involved in your care Any changes since last visit?   no   Family History  Problem Relation Age of Onset  . Cancer Paternal Grandmother        Ovarian   Social History   Socioeconomic History  . Marital status: Single    Spouse name: Not on file  . Number of children: Not on file  . Years of education: Not on file  . Highest education level: Not on file  Occupational History  . Not on file  Social Needs  . Financial resource strain: Not on file  . Food insecurity:    Worry: Not on file    Inability: Not on file  . Transportation needs:    Medical: Not on file    Non-medical: Not on file  Tobacco Use  . Smoking status: Passive Smoke Exposure - Never Smoker  . Smokeless tobacco: Never Used  Substance and Sexual Activity  . Alcohol use: Yes    Comment: occasional  . Drug use: No  . Sexual activity: Yes  Lifestyle  . Physical activity:    Days per week: Not on file    Minutes per session: Not on file  . Stress: Not on file  Relationships  . Social connections:    Talks on phone: Not on file    Gets together: Not on file    Attends religious service: Not on file    Active member of club or organization: Not on file  Attends meetings of clubs or organizations: Not on file    Relationship status: Not on file  Other Topics Concern  . Not on file  Social History Narrative  . Not on file   Past Surgical History:  Procedure Laterality Date  . APPENDECTOMY    . Ceasarean section  2007, 2010,2014  . EXTERNAL FIXATION LEG Bilateral 12/14/2012   Procedure: EXTERNAL FIXATION LEG;  Surgeon: [name], MD;  Location: [place];  Service: Orthopedics;  Laterality: Bilateral;  . EXTERNAL FIXATION REMOVAL Bilateral 12/23/2012   Procedure: REMOVAL EXTERNAL FIXATION LEG;  Surgeon: [name], MD;  Location: [place];  Service: Orthopedics;  Laterality: Bilateral;  . I&D EXTREMITY Left 12/14/2012   Procedure: IRRIGATION AND DEBRIDEMENT EXTREMITY;  Surgeon: [name], MD;  Location: [place];  Service: Orthopedics;  Laterality:  Left;  . ORIF ANKLE FRACTURE Left 12/14/2012   Procedure: OPEN REDUCTION INTERNAL FIXATION (ORIF) ANKLE FRACTURE;  Surgeon: [name], MD;  Location[facility];  Service: Orthopedics;  Laterality: Left;  . ORIF TIBIA FRACTURE Bilateral 12/23/2012   Procedure: OPEN REDUCTION INTERNAL FIXATION (ORIF) BILATERAL DISTAL TIBIA FRACTURE;  Surgeon: [name], MD;  Location: [place];  Service: Orthopedics;  Laterality: Bilateral;   Past Medical History:  Diagnosis Date  . Anemia   . Anxiety   . Depression   . Heart murmur    There were no vitals taken for this visit.  Opioid Risk Score:   Fall Risk Score:  `1  Depression screen PHQ 2/9  Depression screen [facility] 2/9 11/11/2017 07/15/2017 12/24/2014  Decreased Interest 0 0 2  Down, Depressed, Hopeless 0 0 2  PHQ - 2 Score 0 0 4  Altered sleeping - - 3  Tired, decreased energy - - 3  Change in appetite - - 2  Feeling bad or failure about yourself  - - 2  Trouble concentrating - - 3  Moving slowly or fidgety/restless - - 3  Suicidal thoughts - - 0  PHQ-9 Score - - 20     Review of Systems  Constitutional: Negative.   HENT: Negative.   Eyes: Negative.   Respiratory: Negative.   Cardiovascular: Negative.   Gastrointestinal: Negative.   Endocrine: Negative.   Genitourinary: Negative.   Musculoskeletal: Positive for myalgias, neck pain and neck stiffness.  Skin: Negative.   Allergic/Immunologic: Negative.   Neurological: Positive for weakness, numbness and headaches.  Hematological: Negative.   Psychiatric/Behavioral: The patient is nervous/anxious.   All other systems reviewed and are negative.      Objective:   Physical Exam General: No acute distress HEENT: EOMI, oral membranes moist Cards: reg rate  Chest: normal effort Abdomen: Soft, NT, ND Skin: dry, intact Extremities: no edema  Musculoskeletal:  Strength almost 5/5. Ankles are limited somewhat due to surgery and some discomfort. Mild antalgia with gait  bilaterally Neurological: She is alertand oriented to person, place, and time. much less distracted. More insight and awareness.  Skin: Skin is warmand dry.  Psychiatric: pleasant and more relaxed.  .       Assessment & Plan:  1.TBI: further education provided -continue to organize as much as possible..  - I felt that a reasonable compromise was to try some volunteer work for an hour to each week to see how things went.  If things go well there she could consider looking into some part-time work but honestly she has enough on her hands with her 3 children at this point. 2. Chronic  Pain Syndrome, bilateral ankle fractures: Refilled: Oxycodone 10 mg one tablet TID prnas needed as needed #80. 2ndscript given for next month.  -We will continue the controlled substance monitoring program, this consists of regular clinic visits, examinations, routine drug screening, pill counts as well as use of [place] Controlled Substance Reporting System. [place] was reviewed today.    3. Panic Attacks: Generally improved  5. Post traumatic Stress disorder: dc klonopin -continue withstress mgt strategies -Patient appears more realistic about coping skills on her abilities from a cognitive standpoint.  Still needs to work on knowing her limits. 6. Migraine headaches: discussed importance of sleep.   -excedrin migraine   -naproxen earlier before migraine worsens  -maximize sleep and mood  7minutes of face to face patient care time was spent during this visit. All questions were encouraged and answered.  Follow up in 2 months with NP.

## 2018-03-11 NOTE — Patient Instructions (Signed)
IDEAS FOR TREATMENT OF MIGRAINE  1. CONSIDER EXCEDRIN MIGRAINE FOR RESCUE RELIEF FOR YOUR MIGRAINES  2. TAKE YOUR NAPROXEN EARLIER BEFORE HEADACHE IS OUT OF CONTROL  3. GET ENOUGH SLEEP!

## 2018-04-08 ENCOUNTER — Telehealth: Payer: Self-pay | Admitting: Physical Medicine & Rehabilitation

## 2018-04-08 NOTE — Telephone Encounter (Signed)
[facility] was called,they will fill her Oxycodone prescription on 04/09/2018.

## 2018-04-08 NOTE — Telephone Encounter (Signed)
Patient's pharmacy is closed on 7/4 and patient will need her medication.  Can someone please call pharmacy [facility] in [facility] on [address] to get this filled a day before?  Also please call patient when this is complete.

## 2018-05-12 ENCOUNTER — Encounter: Payer: Self-pay | Admitting: Physical Medicine & Rehabilitation

## 2018-05-12 ENCOUNTER — Encounter: Payer: Medicare Other | Attending: Physical Medicine & Rehabilitation | Admitting: Physical Medicine & Rehabilitation

## 2018-05-12 VITALS — BP 111/75 | HR 83 | Ht 65.0 in | Wt 218.0 lb

## 2018-05-12 DIAGNOSIS — R51 Headache: Secondary | ICD-10-CM | POA: Insufficient documentation

## 2018-05-12 DIAGNOSIS — S82872A Displaced pilon fracture of left tibia, initial encounter for closed fracture: Secondary | ICD-10-CM | POA: Insufficient documentation

## 2018-05-12 DIAGNOSIS — R410 Disorientation, unspecified: Secondary | ICD-10-CM | POA: Diagnosis not present

## 2018-05-12 DIAGNOSIS — Z79899 Other long term (current) drug therapy: Secondary | ICD-10-CM | POA: Diagnosis not present

## 2018-05-12 DIAGNOSIS — F329 Major depressive disorder, single episode, unspecified: Secondary | ICD-10-CM | POA: Diagnosis not present

## 2018-05-12 DIAGNOSIS — S069X0A Unspecified intracranial injury without loss of consciousness, initial encounter: Secondary | ICD-10-CM | POA: Diagnosis not present

## 2018-05-12 DIAGNOSIS — D649 Anemia, unspecified: Secondary | ICD-10-CM | POA: Insufficient documentation

## 2018-05-12 DIAGNOSIS — N390 Urinary tract infection, site not specified: Secondary | ICD-10-CM

## 2018-05-12 DIAGNOSIS — S82871A Displaced pilon fracture of right tibia, initial encounter for closed fracture: Secondary | ICD-10-CM | POA: Diagnosis not present

## 2018-05-12 DIAGNOSIS — R42 Dizziness and giddiness: Secondary | ICD-10-CM | POA: Diagnosis not present

## 2018-05-12 DIAGNOSIS — G894 Chronic pain syndrome: Secondary | ICD-10-CM

## 2018-05-12 DIAGNOSIS — Z9889 Other specified postprocedural states: Secondary | ICD-10-CM | POA: Insufficient documentation

## 2018-05-12 DIAGNOSIS — S069X0S Unspecified intracranial injury without loss of consciousness, sequela: Secondary | ICD-10-CM

## 2018-05-12 DIAGNOSIS — G8929 Other chronic pain: Secondary | ICD-10-CM | POA: Diagnosis not present

## 2018-05-12 DIAGNOSIS — Z5181 Encounter for therapeutic drug level monitoring: Secondary | ICD-10-CM | POA: Diagnosis not present

## 2018-05-12 DIAGNOSIS — R252 Cramp and spasm: Secondary | ICD-10-CM | POA: Diagnosis not present

## 2018-05-12 DIAGNOSIS — F068 Other specified mental disorders due to known physiological condition: Secondary | ICD-10-CM

## 2018-05-12 DIAGNOSIS — S060X9S Concussion with loss of consciousness of unspecified duration, sequela: Secondary | ICD-10-CM | POA: Diagnosis not present

## 2018-05-12 DIAGNOSIS — S12100A Unspecified displaced fracture of second cervical vertebra, initial encounter for closed fracture: Secondary | ICD-10-CM | POA: Insufficient documentation

## 2018-05-12 DIAGNOSIS — Z7722 Contact with and (suspected) exposure to environmental tobacco smoke (acute) (chronic): Secondary | ICD-10-CM | POA: Insufficient documentation

## 2018-05-12 DIAGNOSIS — R011 Cardiac murmur, unspecified: Secondary | ICD-10-CM | POA: Diagnosis not present

## 2018-05-12 DIAGNOSIS — S82892S Other fracture of left lower leg, sequela: Secondary | ICD-10-CM | POA: Diagnosis not present

## 2018-05-12 DIAGNOSIS — S82891S Other fracture of right lower leg, sequela: Secondary | ICD-10-CM

## 2018-05-12 DIAGNOSIS — A499 Bacterial infection, unspecified: Secondary | ICD-10-CM

## 2018-05-12 DIAGNOSIS — F419 Anxiety disorder, unspecified: Secondary | ICD-10-CM | POA: Diagnosis not present

## 2018-05-12 MED ORDER — OXYCODONE HCL 10 MG PO TABS: 10.0000 mg | ORAL_TABLET | Freq: Three times a day (TID) | ORAL | 0 refills | Status: DC | PRN

## 2018-05-12 MED ORDER — OXYCODONE HCL 10 MG PO TABS
10.0000 mg | ORAL_TABLET | Freq: Three times a day (TID) | ORAL | 0 refills | Status: DC | PRN
Start: 2018-05-12 — End: 2018-07-09

## 2018-05-12 NOTE — Progress Notes (Signed)
Subjective:    Patient ID: Monique [name], female    DOB: [birth year], 32 y.o.   MRN: [id number]  HPI   Monique [name] is here in follow up of her TBI and chronic pain. She just moved to [place] last month and has settled in. 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 She would like to get training for a job where she can work from home. She has found that using her planner daily has helped her stay organized and retain infromation/schedule. She has also realized the importance of consistent sleep.   She is using oxycodone for pain control. Her pain is worst in feet and ankles although she is having headaches too. excedrin migraine helps them as well as naproxen.  She does admit to needing glasses.     Pain Inventory Average Pain 10 Pain Right Now 7 My pain is sharp, stabbing, tingling and aching  In the last 24 hours, has pain interfered with the following? General activity 9 Relation with others 9 Enjoyment of life 9 What TIME of day is your pain at its worst? evening Sleep (in general) Fair  Pain is worse with: walking, bending, standing and some activites Pain improves with: rest, heat/ice, pacing activities and medication Relief from Meds: 9  Mobility walk without assistance  Function disabled: date disabled .  Neuro/Psych weakness numbness spasms  Prior Studies Any changes since last visit?  no  Physicians involved in your care Any changes since last visit?  no   Family History  Problem Relation Age of Onset  . Cancer Paternal Grandmother        Ovarian   Social History   Socioeconomic History  . Marital status: Single    Spouse name: Not on file  . Number of children: Not on file  . Years of education: Not on file  . Highest education level: Not on file  Occupational History  . Not on file  Social Needs  . Financial resource strain: Not on file  . Food insecurity:    Worry: Not on file   Inability: Not on file  . Transportation needs:    Medical: Not on file    Non-medical: Not on file  Tobacco Use  . Smoking status: Passive Smoke Exposure - Never Smoker  . Smokeless tobacco: Never Used  Substance and Sexual Activity  . Alcohol use: Yes    Comment: occasional  . Drug use: No  . Sexual activity: Yes  Lifestyle  . Physical activity:    Days per week: Not on file    Minutes per session: Not on file  . Stress: Not on file  Relationships  . Social connections:    Talks on phone: Not on file    Gets together: Not on file    Attends religious service: Not on file    Active member of club or organization: Not on file    Attends meetings of clubs or organizations: Not on file    Relationship status: Not on file  Other Topics Concern  . Not on file  Social History Narrative  . Not on file   Past Surgical History:  Procedure Laterality Date  . APPENDECTOMY    . Ceasarean section  2007, 2010,2014  . EXTERNAL FIXATION LEG Bilateral 12/14/2012   Procedure: EXTERNAL FIXATION LEG;  Surgeon: [name], MD;  Location: [place];  Service: Orthopedics;  Laterality: Bilateral;  . EXTERNAL FIXATION REMOVAL Bilateral 12/23/2012  Procedure: REMOVAL EXTERNAL FIXATION LEG;  Surgeon: [name], MD;  Location: [place];  Service: Orthopedics;  Laterality: Bilateral;  . I&D EXTREMITY Left 12/14/2012   Procedure: IRRIGATION AND DEBRIDEMENT EXTREMITY;  Surgeon: [name], MD;  Location: [place];  Service: Orthopedics;  Laterality: Left;  . ORIF ANKLE FRACTURE Left 12/14/2012   Procedure: OPEN REDUCTION INTERNAL FIXATION (ORIF) ANKLE FRACTURE;  Surgeon: [name], MD;  Location: [place];  Service: Orthopedics;  Laterality: Left;  . ORIF TIBIA FRACTURE Bilateral 12/23/2012   Procedure: OPEN REDUCTION INTERNAL FIXATION (ORIF) BILATERAL DISTAL TIBIA FRACTURE;  Surgeon: [name], MD;  Location: [place];  Service: Orthopedics;  Laterality: Bilateral;   Past Medical History:    Diagnosis Date  . Anemia   . Anxiety   . Depression   . Heart murmur    There were no vitals taken for this visit.  Opioid Risk Score:   Fall Risk Score:  `1  Depression screen PHQ 2/9  Depression screen [facility] 2/9 11/11/2017 07/15/2017 12/24/2014  Decreased Interest 0 0 2  Down, Depressed, Hopeless 0 0 2  PHQ - 2 Score 0 0 4  Altered sleeping - - 3  Tired, decreased energy - - 3  Change in appetite - - 2  Feeling bad or failure about yourself  - - 2  Trouble concentrating - - 3  Moving slowly or fidgety/restless - - 3  Suicidal thoughts - - 0  PHQ-9 Score - - 20     Review of Systems  Constitutional: Negative.   HENT: Negative.   Eyes: Negative.   Respiratory: Negative.   Cardiovascular: Negative.   Gastrointestinal: Negative.   Endocrine: Negative.   Genitourinary: Negative.   Musculoskeletal: Positive for arthralgias and myalgias.  Skin: Negative.   Allergic/Immunologic: Negative.   Neurological: Positive for weakness and numbness.  Hematological: Negative.   Psychiatric/Behavioral: Negative.   All other systems reviewed and are negative.      Objective:   Physical Exam  General: No acute distress HEENT: EOMI, oral membranes moist Cards: reg rate  Chest: normal effort Abdomen: Soft, NT, ND Skin: dry, intact Extremities: no edema   Musculoskeletal:  Ankles are limited somewhat due to surgery and some discomfort. Continue antalgia Neurological: She is alertand oriented to person, place, and time.much less distracted. More insight and awareness. improved attention Skin: Skin is warmand dry.  Psychiatric: calm and pleasant.  .       Assessment & Plan:  1.TBI: further education provided -continue to maintain organization as much as possible. She's doing a much better job with this.    -consider volunteer work or online class to expand her cognitive horizons. She is receptive to this.  2. Chronic Pain Syndrome, bilateral ankle  fractures: Refilled: Oxycodone 10 mg one tablet TID prnas needed as needed #80. 2ndscript given for next month.  -We will continue the controlled substance monitoring program, this consists of regular clinic visits, examinations, routine drug screening, pill counts as well as use of [place] Controlled Substance Reporting System. [place] was reviewed today.   -UDS today 3. Panic Attacks:   Improved with better organization skills above  5. Post traumatic Stress disorder:  -continue withstress mgt strategies -improved awareness of deficits 6. Migraine headaches: discussed importance of sleep.             -excedrin migraine             -naproxen prn            -  maximize sleep and mood as we've discussed  15 min encouraged and answered.  Follow up in 2 months with NP.

## 2018-05-12 NOTE — Patient Instructions (Signed)
PLEASE FEEL FREE TO CALL OUR OFFICE WITH ANY PROBLEMS OR QUESTIONS ([phone])      

## 2018-05-13 ENCOUNTER — Telehealth: Payer: Self-pay | Admitting: Physical Medicine & Rehabilitation

## 2018-05-13 LAB — URINALYSIS
Bilirubin, UA: NEGATIVE
Glucose, UA: NEGATIVE
Ketones, UA: NEGATIVE
Leukocytes, UA: NEGATIVE
Nitrite, UA: NEGATIVE
RBC, UA: NEGATIVE
Urobilinogen, Ur: 0.2 mg/dL (ref 0.2–1.0)
pH, UA: 6.5 (ref 5.0–7.5)

## 2018-05-13 NOTE — Telephone Encounter (Signed)
Pt is calling to ck on lab results for UTI that she had on 8/5. Also to see if a rx would be sent in for that?  Thanks, Vilinda Blanks.

## 2018-05-13 NOTE — Telephone Encounter (Signed)
I am still waiting on the urine culture. (urinalysis was negative).  When we get that back, we can decide if she needs abx or not. Thanks!

## 2018-05-14 LAB — URINE CULTURE

## 2018-05-15 NOTE — Telephone Encounter (Signed)
Verified labs are complete as of 05-14-18, forwarding to Dr. Naaman Plummer for review and possible treatment plan.

## 2018-05-15 NOTE — Telephone Encounter (Signed)
New Message  Pt verbalized no one has called her back in regards to her Urinalysis and if she needs and rx.  Please f/u with pt

## 2018-05-16 LAB — TOXASSURE SELECT,+ANTIDEPR,UR

## 2018-05-16 MED ORDER — AMOXICILLIN 250 MG PO CAPS: 250.0000 mg | ORAL_CAPSULE | Freq: Three times a day (TID) | ORAL | 0 refills | Status: DC

## 2018-05-16 NOTE — Telephone Encounter (Signed)
Pt.notified

## 2018-05-16 NOTE — Telephone Encounter (Signed)
I have sent in amoxicillin rx to pharmacy. I apologize for the delay. Didn't see the results on my dashboard

## 2018-05-19 ENCOUNTER — Telehealth: Payer: Self-pay | Admitting: *Deleted

## 2018-05-19 NOTE — Telephone Encounter (Signed)
Urine drug screen for this encounter is consistent for prescribed medication 

## 2018-07-09 ENCOUNTER — Encounter: Payer: Self-pay | Admitting: Registered Nurse

## 2018-07-09 ENCOUNTER — Encounter: Payer: Medicare Other | Attending: Physical Medicine & Rehabilitation | Admitting: Registered Nurse

## 2018-07-09 VITALS — BP 111/78 | HR 86 | Resp 14 | Ht 65.0 in | Wt 206.0 lb

## 2018-07-09 DIAGNOSIS — R51 Headache: Secondary | ICD-10-CM | POA: Diagnosis not present

## 2018-07-09 DIAGNOSIS — S12100A Unspecified displaced fracture of second cervical vertebra, initial encounter for closed fracture: Secondary | ICD-10-CM | POA: Diagnosis not present

## 2018-07-09 DIAGNOSIS — Z5181 Encounter for therapeutic drug level monitoring: Secondary | ICD-10-CM

## 2018-07-09 DIAGNOSIS — S060X9S Concussion with loss of consciousness of unspecified duration, sequela: Secondary | ICD-10-CM | POA: Diagnosis not present

## 2018-07-09 DIAGNOSIS — D649 Anemia, unspecified: Secondary | ICD-10-CM | POA: Insufficient documentation

## 2018-07-09 DIAGNOSIS — R011 Cardiac murmur, unspecified: Secondary | ICD-10-CM | POA: Insufficient documentation

## 2018-07-09 DIAGNOSIS — F419 Anxiety disorder, unspecified: Secondary | ICD-10-CM | POA: Diagnosis not present

## 2018-07-09 DIAGNOSIS — G894 Chronic pain syndrome: Secondary | ICD-10-CM

## 2018-07-09 DIAGNOSIS — S82892S Other fracture of left lower leg, sequela: Secondary | ICD-10-CM | POA: Diagnosis not present

## 2018-07-09 DIAGNOSIS — S82871A Displaced pilon fracture of right tibia, initial encounter for closed fracture: Secondary | ICD-10-CM | POA: Insufficient documentation

## 2018-07-09 DIAGNOSIS — Z79899 Other long term (current) drug therapy: Secondary | ICD-10-CM | POA: Insufficient documentation

## 2018-07-09 DIAGNOSIS — S069X0A Unspecified intracranial injury without loss of consciousness, initial encounter: Secondary | ICD-10-CM | POA: Insufficient documentation

## 2018-07-09 DIAGNOSIS — R413 Other amnesia: Secondary | ICD-10-CM

## 2018-07-09 DIAGNOSIS — Z9889 Other specified postprocedural states: Secondary | ICD-10-CM | POA: Diagnosis not present

## 2018-07-09 DIAGNOSIS — G43009 Migraine without aura, not intractable, without status migrainosus: Secondary | ICD-10-CM | POA: Diagnosis not present

## 2018-07-09 DIAGNOSIS — F329 Major depressive disorder, single episode, unspecified: Secondary | ICD-10-CM | POA: Insufficient documentation

## 2018-07-09 DIAGNOSIS — Z7722 Contact with and (suspected) exposure to environmental tobacco smoke (acute) (chronic): Secondary | ICD-10-CM | POA: Diagnosis not present

## 2018-07-09 DIAGNOSIS — S069X0S Unspecified intracranial injury without loss of consciousness, sequela: Secondary | ICD-10-CM

## 2018-07-09 DIAGNOSIS — S82872A Displaced pilon fracture of left tibia, initial encounter for closed fracture: Secondary | ICD-10-CM | POA: Diagnosis not present

## 2018-07-09 DIAGNOSIS — F411 Generalized anxiety disorder: Secondary | ICD-10-CM

## 2018-07-09 DIAGNOSIS — R42 Dizziness and giddiness: Secondary | ICD-10-CM | POA: Insufficient documentation

## 2018-07-09 DIAGNOSIS — S82891S Other fracture of right lower leg, sequela: Secondary | ICD-10-CM

## 2018-07-09 DIAGNOSIS — G8929 Other chronic pain: Secondary | ICD-10-CM | POA: Diagnosis not present

## 2018-07-09 DIAGNOSIS — R410 Disorientation, unspecified: Secondary | ICD-10-CM | POA: Diagnosis not present

## 2018-07-09 DIAGNOSIS — F068 Other specified mental disorders due to known physiological condition: Secondary | ICD-10-CM | POA: Diagnosis not present

## 2018-07-09 DIAGNOSIS — R252 Cramp and spasm: Secondary | ICD-10-CM | POA: Insufficient documentation

## 2018-07-09 MED ORDER — OXYCODONE HCL 10 MG PO TABS: 10.0000 mg | ORAL_TABLET | Freq: Three times a day (TID) | ORAL | 0 refills | Status: DC | PRN

## 2018-07-09 MED ORDER — NAPROXEN 500 MG PO TABS: 500.0000 mg | ORAL_TABLET | Freq: Two times a day (BID) | ORAL | 3 refills | Status: DC | PRN

## 2018-07-09 MED ORDER — OXYCODONE HCL 10 MG PO TABS
10.0000 mg | ORAL_TABLET | Freq: Three times a day (TID) | ORAL | 0 refills | Status: DC | PRN
Start: 2018-07-09 — End: 2018-09-08

## 2018-07-09 NOTE — Progress Notes (Signed)
Subjective:    Patient ID: Monique [name], female    DOB: 1986-09-27, 32 y.o.   MRN: [id number]  HPI: Monique [name] is a 32 year old female who returns for follow up appointment for chronic pain and medication refill. She states her pain is located in her bilateral feet and reports she has a migraine, reports it began a few weeks with no relief noted. Also reports she has noticed memory changes has a history of TBI also cognitive deficits. We will place a referral for neurology consult, states she has periods of anxiety with life issues. She denies depression and suicidal ideation. She wants to resume counseling, [facility] Pamphlet given, she states she will call today.   [name] Morphine Equivalent is 46.15 MME. Last UDS was Performed on 05/12/2018, it was consistent.    Pain Inventory Average Pain 9 Pain Right Now 6 My pain is constant, sharp, stabbing and aching  In the last 24 hours, has pain interfered with the following? General activity 10 Relation with others 10 Enjoyment of life 10 What TIME of day is your pain at its worst? daytime, evening, night Sleep (in general) Fair  Pain is worse with: walking, bending, standing and some activites Pain improves with: rest, pacing activities and medication Relief from Meds: 9  Mobility walk without assistance ability to climb steps?  yes do you drive?  yes Do you have any goals in this area?  no  Function disabled: date disabled . Do you have any goals in this area?  yes  Neuro/Psych bladder control problems weakness numbness tingling dizziness confusion anxiety  Prior Studies Any changes since last visit?  no  Physicians involved in your care Any changes since last visit?  no   Family History  Problem Relation Age of Onset  . Cancer Paternal Grandmother        Ovarian   Social History   Socioeconomic History  . Marital status: Single    Spouse name: Not on file  . Number of  children: Not on file  . Years of education: Not on file  . Highest education level: Not on file  Occupational History  . Not on file  Social Needs  . Financial resource strain: Not on file  . Food insecurity:    Worry: Not on file    Inability: Not on file  . Transportation needs:    Medical: Not on file    Non-medical: Not on file  Tobacco Use  . Smoking status: Passive Smoke Exposure - Never Smoker  . Smokeless tobacco: Never Used  Substance and Sexual Activity  . Alcohol use: Yes    Comment: occasional  . Drug use: No  . Sexual activity: Yes  Lifestyle  . Physical activity:    Days per week: Not on file    Minutes per session: Not on file  . Stress: Not on file  Relationships  . Social connections:    Talks on phone: Not on file    Gets together: Not on file    Attends religious service: Not on file    Active member of club or organization: Not on file    Attends meetings of clubs or organizations: Not on file    Relationship status: Not on file  Other Topics Concern  . Not on file  Social History Narrative  . Not on file   Past Surgical History:  Procedure Laterality Date  . APPENDECTOMY    . Ceasarean section  2007,  [id number]  . EXTERNAL FIXATION LEG Bilateral 12/14/2012   Procedure: EXTERNAL FIXATION LEG;  Surgeon: [name], MD;  Location: [place];  Service: Orthopedics;  Laterality: Bilateral;  . EXTERNAL FIXATION REMOVAL Bilateral 12/23/2012   Procedure: REMOVAL EXTERNAL FIXATION LEG;  Surgeon: [name], MD;  Location: [place];  Service: Orthopedics;  Laterality: Bilateral;  . I&D EXTREMITY Left 12/14/2012   Procedure: IRRIGATION AND DEBRIDEMENT EXTREMITY;  Surgeon: [name], MD;  Location: [place];  Service: Orthopedics;  Laterality: Left;  . ORIF ANKLE FRACTURE Left 12/14/2012   Procedure: OPEN REDUCTION INTERNAL FIXATION (ORIF) ANKLE FRACTURE;  Surgeon: [name], MD;  Location: [place];  Service: Orthopedics;  Laterality: Left;  . ORIF TIBIA  FRACTURE Bilateral 12/23/2012   Procedure: OPEN REDUCTION INTERNAL FIXATION (ORIF) BILATERAL DISTAL TIBIA FRACTURE;  Surgeon: [name], MD;  Location: [place];  Service: Orthopedics;  Laterality: Bilateral;   Past Medical History:  Diagnosis Date  . Anemia   . Anxiety   . Depression   . Heart murmur    BP 111/78 (BP Location: Left Arm, Patient Position: Sitting, Cuff Size: Normal)   Pulse 86   Resp 14   Ht 5\' 5"  (1.651 m)   Wt 206 lb (93.4 kg)   SpO2 98%   BMI 34.28 kg/m   Opioid Risk Score:   Fall Risk Score:  `1  Depression screen PHQ 2/9  Depression screen [facility] 2/9 11/11/2017 07/15/2017 12/24/2014  Decreased Interest 0 0 2  Down, Depressed, Hopeless 0 0 2  PHQ - 2 Score 0 0 4  Altered sleeping - - 3  Tired, decreased energy - - 3  Change in appetite - - 2  Feeling bad or failure about yourself  - - 2  Trouble concentrating - - 3  Moving slowly or fidgety/restless - - 3  Suicidal thoughts - - 0  PHQ-9 Score - - 20    Review of Systems  Constitutional: Negative.   HENT: Negative.   Eyes: Negative.   Respiratory: Negative.   Cardiovascular: Negative.   Gastrointestinal: Negative.   Endocrine: Negative.   Genitourinary: Positive for difficulty urinating and dysuria.  Musculoskeletal: Positive for arthralgias.  Skin: Negative.   Allergic/Immunologic: Negative.   Neurological: Positive for dizziness, weakness and numbness.       Tingling  Hematological: Negative.   Psychiatric/Behavioral: Negative.        Objective:   Physical Exam  Constitutional: She is oriented to person, place, and time. She appears well-developed and well-nourished.  HENT:  Head: Normocephalic and atraumatic.  Neck: Normal range of motion. Neck supple.  Cardiovascular: Normal rate and regular rhythm.  Pulmonary/Chest: Effort normal and breath sounds normal.  Musculoskeletal:  Normal Muscle Bulk and Muscle Testing Reveals: Upper Extremities: Full ROM and Muscle Strength 5/5 Lower  Extremities: Full ROM and Muscle Strength 5/5 Arises from chair with ease Narrow Based Gait  Neurological: She is alert and oriented to person, place, and time.  Skin: Skin is warm and dry.  Psychiatric: She has a normal mood and affect. Her behavior is normal.  Nursing note and vitals reviewed.         Assessment & Plan:  .FAO:ZHYQMVHQ to Monitor.07/09/2018 Continue with current treatment: using the various devices to help with your Organization such as notes, calendars and using her cell phone to set reminders. 2. Chronic Pain Syndrome: Refilled: Continue current treatmentOxycodone 10 mg one tablet TID as needed as needed #80. Second script e-scribe for  the following month.07/09/2018 3. Bilateral Pilon Fractures: Continueto Monitor. 07/09/2018 4. AnxietyPanic Attacks: RX: [facility] Pamphlet Given She denies depression or suicidal ideation. 07/09/2018. 5. Tibial Nerve Injuries: Continue to monitor.07/09/2018 6. Post traumatic Stress disorder: RX: [facility] Pamphlet Given.  7.Memory changes and Cognitive Deficits: RX: Referral Neurology. 07/09/2018. 8. Migraine: RX: Naproxen: Neurology Consult.   20 minutes of face to face patient care time was spent during this visit. All questions were encouraged and answered.  F/U Visit in 2 months

## 2018-09-08 ENCOUNTER — Encounter: Payer: Self-pay | Admitting: Registered Nurse

## 2018-09-08 ENCOUNTER — Encounter: Payer: Medicare Other | Attending: Physical Medicine & Rehabilitation | Admitting: Registered Nurse

## 2018-09-08 VITALS — BP 102/70 | HR 77 | Ht 65.0 in | Wt 204.0 lb

## 2018-09-08 DIAGNOSIS — S82892S Other fracture of left lower leg, sequela: Secondary | ICD-10-CM

## 2018-09-08 DIAGNOSIS — S82891S Other fracture of right lower leg, sequela: Secondary | ICD-10-CM | POA: Diagnosis not present

## 2018-09-08 DIAGNOSIS — D649 Anemia, unspecified: Secondary | ICD-10-CM | POA: Diagnosis not present

## 2018-09-08 DIAGNOSIS — F329 Major depressive disorder, single episode, unspecified: Secondary | ICD-10-CM | POA: Diagnosis not present

## 2018-09-08 DIAGNOSIS — F068 Other specified mental disorders due to known physiological condition: Secondary | ICD-10-CM

## 2018-09-08 DIAGNOSIS — R51 Headache: Secondary | ICD-10-CM | POA: Diagnosis not present

## 2018-09-08 DIAGNOSIS — S060X9S Concussion with loss of consciousness of unspecified duration, sequela: Secondary | ICD-10-CM

## 2018-09-08 DIAGNOSIS — S069X0A Unspecified intracranial injury without loss of consciousness, initial encounter: Secondary | ICD-10-CM | POA: Insufficient documentation

## 2018-09-08 DIAGNOSIS — R011 Cardiac murmur, unspecified: Secondary | ICD-10-CM | POA: Diagnosis not present

## 2018-09-08 DIAGNOSIS — Z7722 Contact with and (suspected) exposure to environmental tobacco smoke (acute) (chronic): Secondary | ICD-10-CM | POA: Diagnosis not present

## 2018-09-08 DIAGNOSIS — R410 Disorientation, unspecified: Secondary | ICD-10-CM | POA: Insufficient documentation

## 2018-09-08 DIAGNOSIS — R252 Cramp and spasm: Secondary | ICD-10-CM | POA: Diagnosis not present

## 2018-09-08 DIAGNOSIS — Z5181 Encounter for therapeutic drug level monitoring: Secondary | ICD-10-CM

## 2018-09-08 DIAGNOSIS — S82871A Displaced pilon fracture of right tibia, initial encounter for closed fracture: Secondary | ICD-10-CM | POA: Diagnosis not present

## 2018-09-08 DIAGNOSIS — S069X0S Unspecified intracranial injury without loss of consciousness, sequela: Secondary | ICD-10-CM

## 2018-09-08 DIAGNOSIS — G894 Chronic pain syndrome: Secondary | ICD-10-CM | POA: Diagnosis not present

## 2018-09-08 DIAGNOSIS — F419 Anxiety disorder, unspecified: Secondary | ICD-10-CM | POA: Insufficient documentation

## 2018-09-08 DIAGNOSIS — S82872A Displaced pilon fracture of left tibia, initial encounter for closed fracture: Secondary | ICD-10-CM | POA: Insufficient documentation

## 2018-09-08 DIAGNOSIS — Z9889 Other specified postprocedural states: Secondary | ICD-10-CM | POA: Diagnosis not present

## 2018-09-08 DIAGNOSIS — R42 Dizziness and giddiness: Secondary | ICD-10-CM | POA: Diagnosis not present

## 2018-09-08 DIAGNOSIS — G8929 Other chronic pain: Secondary | ICD-10-CM | POA: Diagnosis not present

## 2018-09-08 DIAGNOSIS — Z79899 Other long term (current) drug therapy: Secondary | ICD-10-CM | POA: Diagnosis not present

## 2018-09-08 DIAGNOSIS — S12100A Unspecified displaced fracture of second cervical vertebra, initial encounter for closed fracture: Secondary | ICD-10-CM | POA: Diagnosis not present

## 2018-09-08 MED ORDER — OXYCODONE HCL 10 MG PO TABS: 10.0000 mg | ORAL_TABLET | Freq: Three times a day (TID) | ORAL | 0 refills | Status: DC | PRN

## 2018-09-08 MED ORDER — OXYCODONE HCL 10 MG PO TABS
10.0000 mg | ORAL_TABLET | Freq: Three times a day (TID) | ORAL | 0 refills | Status: DC | PRN
Start: 2018-09-08 — End: 2018-09-08

## 2018-09-08 NOTE — Progress Notes (Signed)
Subjective:    Patient ID: Monique [name], female    DOB: May 31, 1986, 32 y.o.   MRN: [id number]  HPI: Monique [name] is a 32 y.o. female who returns for follow up appointment for chronic pain and medication refill. She states her pain is located in her bilateral feet. She rates her pain 5. Her current exercise regime is walking and performing stretching exercises. Also reports she will be attending the [facility] this week.  Monique [name] equivalent is 46.15 MME.  Last UDS was Performed on 05/12/2018, it was consistent.   Pain Inventory Average Pain 9 Pain Right Now 5 My pain is constant, sharp, stabbing and aching  In the last 24 hours, has pain interfered with the following? General activity 9 Relation with others 9 Enjoyment of life 9 What TIME of day is your pain at its worst? night Sleep (in general) Fair  Pain is worse with: walking, bending and some activites Pain improves with: rest, heat/ice, therapy/exercise, pacing activities and medication Relief from Meds: na  Mobility walk without assistance ability to climb steps?  yes do you drive?  yes  Function disabled: date disabled .  Neuro/Psych weakness numbness spasms  Prior Studies Any changes since last visit?  no  Physicians involved in your care Any changes since last visit?  no   Family History  Problem Relation Age of Onset  . Cancer Paternal Grandmother        Ovarian   Social History   Socioeconomic History  . Marital status: Single    Spouse name: Not on file  . Number of children: Not on file  . Years of education: Not on file  . Highest education level: Not on file  Occupational History  . Not on file  Social Needs  . Financial resource strain: Not on file  . Food insecurity:    Worry: Not on file    Inability: Not on file  . Transportation needs:    Medical: Not on file    Non-medical: Not on file  Tobacco Use  . Smoking status: Passive Smoke Exposure - Never  Smoker  . Smokeless tobacco: Never Used  Substance and Sexual Activity  . Alcohol use: Yes    Comment: occasional  . Drug use: No  . Sexual activity: Yes  Lifestyle  . Physical activity:    Days per week: Not on file    Minutes per session: Not on file  . Stress: Not on file  Relationships  . Social connections:    Talks on phone: Not on file    Gets together: Not on file    Attends religious service: Not on file    Active member of club or organization: Not on file    Attends meetings of clubs or organizations: Not on file    Relationship status: Not on file  Other Topics Concern  . Not on file  Social History Narrative  . Not on file   Past Surgical History:  Procedure Laterality Date  . APPENDECTOMY    . Ceasarean section  2007, 2010,2014  . EXTERNAL FIXATION LEG Bilateral 12/14/2012   Procedure: EXTERNAL FIXATION LEG;  Surgeon: [name], MD;  Location: [place];  Service: Orthopedics;  Laterality: Bilateral;  . EXTERNAL FIXATION REMOVAL Bilateral 12/23/2012   Procedure: REMOVAL EXTERNAL FIXATION LEG;  Surgeon: [name], MD;  Location: [place];  Service: Orthopedics;  Laterality: Bilateral;  . I&D EXTREMITY Left 12/14/2012   Procedure: IRRIGATION AND DEBRIDEMENT EXTREMITY;  Surgeon: [name], MD;  Location: [place];  Service: Orthopedics;  Laterality: Left;  . ORIF ANKLE FRACTURE Left 12/14/2012   Procedure: OPEN REDUCTION INTERNAL FIXATION (ORIF) ANKLE FRACTURE;  Surgeon: [name], MD;  Location: [place];  Service: Orthopedics;  Laterality: Left;  . ORIF TIBIA FRACTURE Bilateral 12/23/2012   Procedure: OPEN REDUCTION INTERNAL FIXATION (ORIF) BILATERAL DISTAL TIBIA FRACTURE;  Surgeon: [name], MD;  Location: [place];  Service: Orthopedics;  Laterality: Bilateral;   Past Medical History:  Diagnosis Date  . Anemia   . Anxiety   . Depression   . Heart murmur    BP 102/70   Pulse 77   Ht 5\' 5"  (1.651 m)   Wt 204 lb (92.5 kg)   LMP 08/24/2018   SpO2 99%    BMI 33.95 kg/m   Opioid Risk Score:   Fall Risk Score:  `1  Depression screen PHQ 2/9  Depression screen [facility] 2/9 11/11/2017 07/15/2017 12/24/2014  Decreased Interest 0 0 2  Down, Depressed, Hopeless 0 0 2  PHQ - 2 Score 0 0 4  Altered sleeping - - 3  Tired, decreased energy - - 3  Change in appetite - - 2  Feeling bad or failure about yourself  - - 2  Trouble concentrating - - 3  Moving slowly or fidgety/restless - - 3  Suicidal thoughts - - 0  PHQ-9 Score - - 20     Review of Systems  Constitutional: Negative.   HENT: Negative.   Eyes: Negative.   Respiratory: Negative.   Cardiovascular: Negative.   Gastrointestinal: Negative.   Endocrine: Negative.   Genitourinary: Negative.   Musculoskeletal: Positive for arthralgias and myalgias.  Skin: Negative.   Allergic/Immunologic: Negative.   Neurological: Positive for weakness and numbness.  Hematological: Negative.   Psychiatric/Behavioral: Negative.   All other systems reviewed and are negative.      Objective:   Physical Exam  Constitutional: She is oriented to person, place, and time. She appears well-developed and well-nourished.  HENT:  Head: Normocephalic and atraumatic.  Neck: Normal range of motion. Neck supple.  Cardiovascular: Normal rate and regular rhythm.  Pulmonary/Chest: Effort normal and breath sounds normal.  Musculoskeletal:  Normal Muscle Bulk and Muscle Testing Reveals:  Upper Extremities: Full ROM and Muscle Strength 5/5   Lower Extremities: Full ROM and Muscle Strength 5/5 Bilateral Lower Extremity Flexion Produces Pain into her Her Bilateral feet Arises from chair with ease Narrow Based Gait   Neurological: She is alert and oriented to person, place, and time.  Skin: Skin is warm and dry.  Psychiatric: She has a normal mood and affect. Her behavior is normal.  Nursing note and vitals reviewed.         Assessment & Plan:  1.TBI:Continue to Monitor.09/08/2018 Continue with current  treatment:using the various devices to help with your Organization such as notes, calendars and using her cell phone to set reminders. 2. Chronic Pain Syndrome: Refilled:Continue current treatmentOxycodone 10 mg one tablet TID as needed as needed #80. Second scripte-scribefor the following month.09/08/2018 3. Bilateral Pilon Fractures: Continueto Monitor. 09/08/2018 4. Tibial Nerve Injuries: Continue to monitor.09/08/2018 5. Migraine: Continue :Naproxen:  Awaiting appointment from Neurology.   20 minutes of face to face patient care time was spent during this visit. All questions were encouraged and answered.  F/U Visit in 2 months

## 2018-11-05 ENCOUNTER — Encounter: Payer: Self-pay | Admitting: Physical Medicine & Rehabilitation

## 2018-11-05 ENCOUNTER — Encounter: Payer: Medicare Other | Attending: Physical Medicine & Rehabilitation | Admitting: Physical Medicine & Rehabilitation

## 2018-11-05 ENCOUNTER — Other Ambulatory Visit: Payer: Self-pay

## 2018-11-05 VITALS — BP 101/67 | HR 75 | Ht 65.0 in | Wt 193.8 lb

## 2018-11-05 DIAGNOSIS — G8929 Other chronic pain: Secondary | ICD-10-CM | POA: Insufficient documentation

## 2018-11-05 DIAGNOSIS — Z79899 Other long term (current) drug therapy: Secondary | ICD-10-CM | POA: Insufficient documentation

## 2018-11-05 DIAGNOSIS — S82891S Other fracture of right lower leg, sequela: Secondary | ICD-10-CM

## 2018-11-05 DIAGNOSIS — S069X2S Unspecified intracranial injury with loss of consciousness of 31 minutes to 59 minutes, sequela: Secondary | ICD-10-CM | POA: Diagnosis not present

## 2018-11-05 DIAGNOSIS — S060X9S Concussion with loss of consciousness of unspecified duration, sequela: Secondary | ICD-10-CM | POA: Diagnosis not present

## 2018-11-05 DIAGNOSIS — R51 Headache: Secondary | ICD-10-CM | POA: Diagnosis not present

## 2018-11-05 DIAGNOSIS — K5903 Drug induced constipation: Secondary | ICD-10-CM | POA: Diagnosis not present

## 2018-11-05 DIAGNOSIS — S069X0A Unspecified intracranial injury without loss of consciousness, initial encounter: Secondary | ICD-10-CM | POA: Insufficient documentation

## 2018-11-05 DIAGNOSIS — G894 Chronic pain syndrome: Secondary | ICD-10-CM

## 2018-11-05 DIAGNOSIS — R410 Disorientation, unspecified: Secondary | ICD-10-CM | POA: Insufficient documentation

## 2018-11-05 DIAGNOSIS — M7631 Iliotibial band syndrome, right leg: Secondary | ICD-10-CM | POA: Diagnosis not present

## 2018-11-05 DIAGNOSIS — F329 Major depressive disorder, single episode, unspecified: Secondary | ICD-10-CM | POA: Insufficient documentation

## 2018-11-05 DIAGNOSIS — R42 Dizziness and giddiness: Secondary | ICD-10-CM | POA: Insufficient documentation

## 2018-11-05 DIAGNOSIS — S82892S Other fracture of left lower leg, sequela: Secondary | ICD-10-CM

## 2018-11-05 DIAGNOSIS — F419 Anxiety disorder, unspecified: Secondary | ICD-10-CM | POA: Insufficient documentation

## 2018-11-05 DIAGNOSIS — S82871A Displaced pilon fracture of right tibia, initial encounter for closed fracture: Secondary | ICD-10-CM | POA: Diagnosis not present

## 2018-11-05 DIAGNOSIS — Z9889 Other specified postprocedural states: Secondary | ICD-10-CM | POA: Diagnosis not present

## 2018-11-05 DIAGNOSIS — S82872A Displaced pilon fracture of left tibia, initial encounter for closed fracture: Secondary | ICD-10-CM | POA: Diagnosis not present

## 2018-11-05 DIAGNOSIS — S069X0S Unspecified intracranial injury without loss of consciousness, sequela: Secondary | ICD-10-CM | POA: Diagnosis not present

## 2018-11-05 DIAGNOSIS — D649 Anemia, unspecified: Secondary | ICD-10-CM | POA: Diagnosis not present

## 2018-11-05 DIAGNOSIS — T402X5A Adverse effect of other opioids, initial encounter: Secondary | ICD-10-CM

## 2018-11-05 DIAGNOSIS — R011 Cardiac murmur, unspecified: Secondary | ICD-10-CM | POA: Diagnosis not present

## 2018-11-05 DIAGNOSIS — F068 Other specified mental disorders due to known physiological condition: Secondary | ICD-10-CM | POA: Diagnosis not present

## 2018-11-05 DIAGNOSIS — Z7722 Contact with and (suspected) exposure to environmental tobacco smoke (acute) (chronic): Secondary | ICD-10-CM | POA: Insufficient documentation

## 2018-11-05 DIAGNOSIS — S12100A Unspecified displaced fracture of second cervical vertebra, initial encounter for closed fracture: Secondary | ICD-10-CM | POA: Insufficient documentation

## 2018-11-05 DIAGNOSIS — R252 Cramp and spasm: Secondary | ICD-10-CM | POA: Insufficient documentation

## 2018-11-05 MED ORDER — OXYCODONE HCL 10 MG PO TABS: 10.0000 mg | ORAL_TABLET | Freq: Three times a day (TID) | ORAL | 0 refills | Status: DC | PRN

## 2018-11-05 NOTE — Progress Notes (Signed)
Subjective:    Patient ID: Monique [name], female    DOB: 18-Jul-1986, 33 y.o.   MRN: [id number]  HPI   This [place] is here in follow-up of her traumatic brain injury and chronic pain syndrome.  I last saw her in August.  For the most part she has been doing fairly well.  She has been trying to work out more.  She is doing much better job with her organizational skills even though concentration and memory remain a challenge.  She is using naproxen and oxycodone currently for pain control.  She has started doing workouts at [facility] as noted some pain developing in her right lower thigh and knee.  Seems to be the worst when she is on the rowing machine.  She feels that her leg sometimes turns "in".  She does not seem to have pain with most other activities.  Walking does not aggravate symptoms.  I asked her if she is done anything for the pain and essentially she has not.  She also reports that constipation is an issue.  She asked me what she could take to "clean herself out".  I asked her if she is taking anything on a daily basis, and she is not.  She is trying to drink plenty of fluids and eat fruits and vegetables however.    Pain Inventory Average Pain 9 Pain Right Now 4 My pain is constant, sharp, stabbing and aching  In the last 24 hours, has pain interfered with the following? General activity 8 Relation with others 8 Enjoyment of life 8 What TIME of day is your pain at its worst? evening night Sleep (in general) Fair  Pain is worse with: walking, bending, standing and some activites Pain improves with: rest, therapy/exercise, pacing activities and medication Relief from Meds: 9  Mobility walk without assistance ability to climb steps?  yes do you drive?  yes  Function disabled: date disabled na  Neuro/Psych confusion anxiety  Prior Studies Any changes since last visit?  no  Physicians involved in your care Any changes since last visit?  no   Family  History  Problem Relation Age of Onset  . Cancer Paternal Grandmother        Ovarian   Social History   Socioeconomic History  . Marital status: Single    Spouse name: Not on file  . Number of children: Not on file  . Years of education: Not on file  . Highest education level: Not on file  Occupational History  . Not on file  Social Needs  . Financial resource strain: Not on file  . Food insecurity:    Worry: Not on file    Inability: Not on file  . Transportation needs:    Medical: Not on file    Non-medical: Not on file  Tobacco Use  . Smoking status: Passive Smoke Exposure - Never Smoker  . Smokeless tobacco: Never Used  Substance and Sexual Activity  . Alcohol use: Yes    Comment: occasional  . Drug use: No  . Sexual activity: Yes  Lifestyle  . Physical activity:    Days per week: Not on file    Minutes per session: Not on file  . Stress: Not on file  Relationships  . Social connections:    Talks on phone: Not on file    Gets together: Not on file    Attends religious service: Not on file    Active member of club or organization: Not  on file    Attends meetings of clubs or organizations: Not on file    Relationship status: Not on file  Other Topics Concern  . Not on file  Social History Narrative  . Not on file   Past Surgical History:  Procedure Laterality Date  . APPENDECTOMY    . Ceasarean section  2007, 2010,2014  . EXTERNAL FIXATION LEG Bilateral 12/14/2012   Procedure: EXTERNAL FIXATION LEG;  Surgeon: [name], MD;  Location: [place];  Service: Orthopedics;  Laterality: Bilateral;  . EXTERNAL FIXATION REMOVAL Bilateral 12/23/2012   Procedure: REMOVAL EXTERNAL FIXATION LEG;  Surgeon: [name], MD;  Location: [place];  Service: Orthopedics;  Laterality: Bilateral;  . I&D EXTREMITY Left 12/14/2012   Procedure: IRRIGATION AND DEBRIDEMENT EXTREMITY;  Surgeon: [name], MD;  Location: [place];  Service: Orthopedics;  Laterality: Left;  . ORIF  ANKLE FRACTURE Left 12/14/2012   Procedure: OPEN REDUCTION INTERNAL FIXATION (ORIF) ANKLE FRACTURE;  Surgeon: [name], MD;  Location: [place];  Service: Orthopedics;  Laterality: Left;  . ORIF TIBIA FRACTURE Bilateral 12/23/2012   Procedure: OPEN REDUCTION INTERNAL FIXATION (ORIF) BILATERAL DISTAL TIBIA FRACTURE;  Surgeon: [name], MD;  Location: [place];  Service: Orthopedics;  Laterality: Bilateral;   Past Medical History:  Diagnosis Date  . Anemia   . Anxiety   . Depression   . Heart murmur    BP 101/67   Pulse 75   Ht 5\' 5"  (1.651 m)   Wt 193 lb 12.8 oz (87.9 kg)   SpO2 98%   BMI 32.25 kg/m   Opioid Risk Score:   Fall Risk Score:  `1  Depression screen PHQ 2/9  Depression screen [facility] 2/9 11/05/2018 11/11/2017 07/15/2017 12/24/2014  Decreased Interest 1 0 0 2  Down, Depressed, Hopeless 1 0 0 2  PHQ - 2 Score 2 0 0 4  Altered sleeping - - - 3  Tired, decreased energy - - - 3  Change in appetite - - - 2  Feeling bad or failure about yourself  - - - 2  Trouble concentrating - - - 3  Moving slowly or fidgety/restless - - - 3  Suicidal thoughts - - - 0  PHQ-9 Score - - - 20    Review of Systems  Constitutional: Negative.   HENT: Negative.   Eyes: Negative.   Respiratory: Negative.   Cardiovascular: Negative.   Gastrointestinal: Negative.   Endocrine: Negative.   Genitourinary: Negative.   Musculoskeletal: Negative.   Skin: Negative.   Allergic/Immunologic: Negative.   Neurological: Negative.   Hematological: Negative.   Psychiatric/Behavioral: Negative.   All other systems reviewed and are negative.      Objective:   Physical Exam  General: No acute distress HEENT: EOMI, oral membranes moist Cards: reg rate  Chest: normal effort Abdomen: Soft, NT, ND Skin: dry, intact Extremities: no edema Musculoskeletal: Patient has some mild right knee pain with palpation along the right iliotibial band.  There is no frank popping or clicking.  No knee  instability is seen.  Her strength at the quads and hamstrings appears functional.  I watched her ambulate and she stays fairly balance with normal hip movements and clearance of both feet during ambulation.  She does not appear to be favoring either side due to pain.  She is wearing over the ankle boots to help with stability.  Neurological: She is alertand oriented to person, place, and time. Still displays decreased attention and  focus but is able to compensate better with note taking and asked me to repeat or write things down for her as well. Skin: Skin is warmand dry.  Psychiatric:Pleasant and appropriate.  .       Assessment & Plan:  1.TBI: further education provided -continue to maintain organization as much as possible.  She continues to do a nice job with this.            2. Chronic Pain Syndrome,bilateral ankle fractures: Refilled: Oxycodone 10 mg one tablet TID prnas needed as needed #80.    -We will continue the controlled substance monitoring program, this consists of regular clinic visits, examinations, routine drug screening, pill counts as well as use of [place] Controlled Substance Reporting System. [place] today.   Medication was refilled and a second prescription was sent to the patient's pharmacy for next month.   -Medication was refilled and a second prescription was sent to the patient's pharmacy for next month.   -I think there is an opportunity to reduce her medication as well. 3. Panic Attacks:  Better with better organization skills above 4. Constipation: discussed senokot-s, miralax as options.   5. Post traumatic Stress disorder:  -continue withstress mgt strategies -improved awareness of deficits 6. Migraine headaches: discussed importance of sleep.  -excedrin migraine  -naproxen prn -have discussed importance of sleep.  7.  Right thigh/lateral  knee pain.  This appears most consistent with an iliotibial band syndrome.  She has recently increased her workouts quite a bit and effort to get into shape.  The rowing machine seems to be the biggest trigger.  -Provide the patient some generalized stretches for her iliotibial band.   -Provided the patient knee strengthening exercises as well.  -All exercises were reviewed.   -We discussed the fact that she needs to ramp herself up with intensity.  She also may need to look at what things she is able to tolerate and what she cannot given her previous trauma history.  30 minutes was spent on direct consultation and formulation of treatment plan Follow up in 2 monthswith NP.

## 2018-11-05 NOTE — Patient Instructions (Signed)
  FOR YOUR BOWELS:  YOU CAN TRY SENOKOT-S AT BED TIME 1-2 TABLETS.   IF THAT DOESN'T HELP TRY DAILY MIRALAX  ADJUST REGIMEN TO DESIRED EFFECT.

## 2018-12-01 DIAGNOSIS — F411 Generalized anxiety disorder: Secondary | ICD-10-CM | POA: Diagnosis not present

## 2018-12-01 DIAGNOSIS — F332 Major depressive disorder, recurrent severe without psychotic features: Secondary | ICD-10-CM | POA: Diagnosis not present

## 2018-12-12 DIAGNOSIS — F332 Major depressive disorder, recurrent severe without psychotic features: Secondary | ICD-10-CM | POA: Diagnosis not present

## 2018-12-12 DIAGNOSIS — F411 Generalized anxiety disorder: Secondary | ICD-10-CM | POA: Diagnosis not present

## 2018-12-26 DIAGNOSIS — F332 Major depressive disorder, recurrent severe without psychotic features: Secondary | ICD-10-CM | POA: Diagnosis not present

## 2018-12-26 DIAGNOSIS — F411 Generalized anxiety disorder: Secondary | ICD-10-CM | POA: Diagnosis not present

## 2018-12-31 DIAGNOSIS — F411 Generalized anxiety disorder: Secondary | ICD-10-CM | POA: Diagnosis not present

## 2018-12-31 DIAGNOSIS — F332 Major depressive disorder, recurrent severe without psychotic features: Secondary | ICD-10-CM | POA: Diagnosis not present

## 2019-01-02 ENCOUNTER — Encounter: Payer: Self-pay | Admitting: Registered Nurse

## 2019-01-02 ENCOUNTER — Encounter: Payer: Medicare Other | Attending: Physical Medicine & Rehabilitation | Admitting: Registered Nurse

## 2019-01-02 VITALS — BP 123/79 | HR 86 | Resp 14 | Ht 65.0 in | Wt 190.0 lb

## 2019-01-02 DIAGNOSIS — D649 Anemia, unspecified: Secondary | ICD-10-CM | POA: Diagnosis not present

## 2019-01-02 DIAGNOSIS — G894 Chronic pain syndrome: Secondary | ICD-10-CM

## 2019-01-02 DIAGNOSIS — S82892S Other fracture of left lower leg, sequela: Secondary | ICD-10-CM | POA: Diagnosis not present

## 2019-01-02 DIAGNOSIS — Z9889 Other specified postprocedural states: Secondary | ICD-10-CM | POA: Diagnosis not present

## 2019-01-02 DIAGNOSIS — F419 Anxiety disorder, unspecified: Secondary | ICD-10-CM | POA: Insufficient documentation

## 2019-01-02 DIAGNOSIS — R42 Dizziness and giddiness: Secondary | ICD-10-CM | POA: Diagnosis not present

## 2019-01-02 DIAGNOSIS — R51 Headache: Secondary | ICD-10-CM | POA: Insufficient documentation

## 2019-01-02 DIAGNOSIS — R410 Disorientation, unspecified: Secondary | ICD-10-CM | POA: Insufficient documentation

## 2019-01-02 DIAGNOSIS — S82891S Other fracture of right lower leg, sequela: Secondary | ICD-10-CM

## 2019-01-02 DIAGNOSIS — S069X0A Unspecified intracranial injury without loss of consciousness, initial encounter: Secondary | ICD-10-CM | POA: Diagnosis not present

## 2019-01-02 DIAGNOSIS — S060X9S Concussion with loss of consciousness of unspecified duration, sequela: Secondary | ICD-10-CM | POA: Diagnosis not present

## 2019-01-02 DIAGNOSIS — Z7722 Contact with and (suspected) exposure to environmental tobacco smoke (acute) (chronic): Secondary | ICD-10-CM | POA: Diagnosis not present

## 2019-01-02 DIAGNOSIS — Z79899 Other long term (current) drug therapy: Secondary | ICD-10-CM | POA: Insufficient documentation

## 2019-01-02 DIAGNOSIS — S82872A Displaced pilon fracture of left tibia, initial encounter for closed fracture: Secondary | ICD-10-CM | POA: Diagnosis not present

## 2019-01-02 DIAGNOSIS — S82871A Displaced pilon fracture of right tibia, initial encounter for closed fracture: Secondary | ICD-10-CM | POA: Diagnosis not present

## 2019-01-02 DIAGNOSIS — Z5181 Encounter for therapeutic drug level monitoring: Secondary | ICD-10-CM | POA: Diagnosis not present

## 2019-01-02 DIAGNOSIS — S069X2S Unspecified intracranial injury with loss of consciousness of 31 minutes to 59 minutes, sequela: Secondary | ICD-10-CM | POA: Diagnosis not present

## 2019-01-02 DIAGNOSIS — G8929 Other chronic pain: Secondary | ICD-10-CM | POA: Insufficient documentation

## 2019-01-02 DIAGNOSIS — Z79891 Long term (current) use of opiate analgesic: Secondary | ICD-10-CM | POA: Diagnosis not present

## 2019-01-02 DIAGNOSIS — R011 Cardiac murmur, unspecified: Secondary | ICD-10-CM | POA: Diagnosis not present

## 2019-01-02 DIAGNOSIS — R252 Cramp and spasm: Secondary | ICD-10-CM | POA: Insufficient documentation

## 2019-01-02 DIAGNOSIS — S12100A Unspecified displaced fracture of second cervical vertebra, initial encounter for closed fracture: Secondary | ICD-10-CM | POA: Insufficient documentation

## 2019-01-02 DIAGNOSIS — F329 Major depressive disorder, single episode, unspecified: Secondary | ICD-10-CM | POA: Diagnosis not present

## 2019-01-02 MED ORDER — OXYCODONE HCL 10 MG PO TABS: 10.0000 mg | ORAL_TABLET | Freq: Three times a day (TID) | ORAL | 0 refills | Status: DC | PRN

## 2019-01-02 NOTE — Progress Notes (Signed)
Subjective:    Patient ID: Monique [name], female    DOB: 1986-08-03, 33 y.o.   MRN: [id number]  HPI: Monique [name] is a 33 y.o. female who returns for follow up appointment for chronic pain and medication refill. She states her pain is located in her neck and bilateral feet. She rates her pain 3.  Her current exercise regime is walking.  Morphine equivalent is 46.15 MME. Ms. Rochefort express interest with weaning of Oxycodone we will continue with slow weaning of Oxycodone, she verbalizes understanding.  Last UDS was performed on 05/12/2018, it was consistent. Oral Swab Performed today.   Pain Inventory Average Pain 9 Pain Right Now 3 My pain is sharp, stabbing and aching  In the last 24 hours, has pain interfered with the following? General activity 9 Relation with others 9 Enjoyment of life 9 What TIME of day is your pain at its worst? morning, evening, night  Sleep (in general) Fair  Pain is worse with: walking, bending, standing and some activites Pain improves with: rest, heat/ice, pacing activities and medication Relief from Meds: 9  Mobility walk without assistance ability to climb steps?  yes do you drive?  yes  Function Do you have any goals in this area?  no  Neuro/Psych weakness numbness anxiety  Prior Studies Any changes since last visit?  no  Physicians involved in your care Any changes since last visit?  no   Family History  Problem Relation Age of Onset  . Cancer Paternal Grandmother        Ovarian   Social History   Socioeconomic History  . Marital status: Single    Spouse name: Not on file  . Number of children: Not on file  . Years of education: Not on file  . Highest education level: Not on file  Occupational History  . Not on file  Social Needs  . Financial resource strain: Not on file  . Food insecurity:    Worry: Not on file    Inability: Not on file  . Transportation needs:    Medical: Not on file    Non-medical:  Not on file  Tobacco Use  . Smoking status: Passive Smoke Exposure - Never Smoker  . Smokeless tobacco: Never Used  Substance and Sexual Activity  . Alcohol use: Yes    Comment: occasional  . Drug use: No  . Sexual activity: Yes  Lifestyle  . Physical activity:    Days per week: Not on file    Minutes per session: Not on file  . Stress: Not on file  Relationships  . Social connections:    Talks on phone: Not on file    Gets together: Not on file    Attends religious service: Not on file    Active member of club or organization: Not on file    Attends meetings of clubs or organizations: Not on file    Relationship status: Not on file  Other Topics Concern  . Not on file  Social History Narrative  . Not on file   Past Surgical History:  Procedure Laterality Date  . APPENDECTOMY    . Ceasarean section  2007, 2010,2014  . EXTERNAL FIXATION LEG Bilateral 12/14/2012   Procedure: EXTERNAL FIXATION LEG;  Surgeon: [name], MD;  Location: [place];  Service: Orthopedics;  Laterality: Bilateral;  . EXTERNAL FIXATION REMOVAL Bilateral 12/23/2012   Procedure: REMOVAL EXTERNAL FIXATION LEG;  Surgeon: [name], MD;  Location: [place];  Service:  Orthopedics;  Laterality: Bilateral;  . I&D EXTREMITY Left 12/14/2012   Procedure: IRRIGATION AND DEBRIDEMENT EXTREMITY;  Surgeon: [name], MD;  Location: [place];  Service: Orthopedics;  Laterality: Left;  . ORIF ANKLE FRACTURE Left 12/14/2012   Procedure: OPEN REDUCTION INTERNAL FIXATION (ORIF) ANKLE FRACTURE;  Surgeon: [name], MD;  Location: [place];  Service: Orthopedics;  Laterality: Left;  . ORIF TIBIA FRACTURE Bilateral 12/23/2012   Procedure: OPEN REDUCTION INTERNAL FIXATION (ORIF) BILATERAL DISTAL TIBIA FRACTURE;  Surgeon: [name], MD;  Location: [place];  Service: Orthopedics;  Laterality: Bilateral;   Past Medical History:  Diagnosis Date  . Anemia   . Anxiety   . Depression   . Heart murmur    BP 123/79   Pulse  86   Resp 14   Ht 5\' 5"  (1.651 m)   Wt 190 lb (86.2 kg)   SpO2 99%   BMI 31.62 kg/m   Opioid Risk Score:   Fall Risk Score:  `1  Depression screen PHQ 2/9  Depression screen [facility] 2/9 11/05/2018 11/11/2017 07/15/2017 12/24/2014  Decreased Interest 1 0 0 2  Down, Depressed, Hopeless 1 0 0 2  PHQ - 2 Score 2 0 0 4  Altered sleeping - - - 3  Tired, decreased energy - - - 3  Change in appetite - - - 2  Feeling bad or failure about yourself  - - - 2  Trouble concentrating - - - 3  Moving slowly or fidgety/restless - - - 3  Suicidal thoughts - - - 0  PHQ-9 Score - - - 20   Review of Systems  Constitutional: Negative.   HENT: Negative.   Eyes: Negative.   Respiratory: Negative.   Cardiovascular: Negative.   Gastrointestinal: Positive for abdominal pain and diarrhea.  Endocrine: Negative.   Genitourinary: Negative.   Musculoskeletal: Positive for arthralgias and neck pain.  Skin: Negative.   Allergic/Immunologic: Negative.   Neurological: Positive for weakness and numbness.  Psychiatric/Behavioral: The patient is nervous/anxious.        Objective:   Physical Exam Vitals signs and nursing note reviewed.  Constitutional:      Appearance: Normal appearance.  Neck:     Musculoskeletal: Normal range of motion and neck supple.  Cardiovascular:     Rate and Rhythm: Normal rate and regular rhythm.     Pulses: Normal pulses.     Heart sounds: Normal heart sounds.  Pulmonary:     Effort: Pulmonary effort is normal.     Breath sounds: Normal breath sounds.  Musculoskeletal:     Comments: Normal Muscle Bulk and Muscle Testing Reveals:  Upper Extremities: Full ROM and Muscle Strength 5/5 Lower Extremities: Full ROM and Muscle Strength 5/5 Arises from Table with ease Narrow Based Gait   Skin:    General: Skin is warm and dry.  Neurological:     Mental Status: She is alert and oriented to person, place, and time.           Assessment & Plan:  1.TBI:Continue to  Monitor.01/02/2019 Continue with current treatment:using the various devices to help with your Organization such as notes, calendars and using her cell phone to set reminders. 2. Chronic Pain Syndrome: Refilled:Slow Weaning: Oxycodone 10 mg one tablet TID as needed as needed #75. Second scripte-scribefor the following month.01/02/2019 3. Bilateral Pilon Fractures: Continueto Monitor. 03//2019 4. Tibial Nerve Injuries: Continue to monitor.09/08/2018 5. Migraine: No complaints today. Continue :Naproxen: Continue to monitor. 01/02/2019  20 minutes  of face to face patient care time was spent during this visit. All questions were encouraged and answered.  F/U Visit in 2 months

## 2019-01-07 DIAGNOSIS — F332 Major depressive disorder, recurrent severe without psychotic features: Secondary | ICD-10-CM | POA: Diagnosis not present

## 2019-01-07 DIAGNOSIS — F411 Generalized anxiety disorder: Secondary | ICD-10-CM | POA: Diagnosis not present

## 2019-01-07 LAB — DRUG TOX MONITOR 1 W/CONF, ORAL FLD
Amphetamines: NEGATIVE ng/mL (ref <10)
Barbiturates: NEGATIVE ng/mL (ref <10)
Benzodiazepines: NEGATIVE ng/mL (ref <0.50)
Buprenorphine: NEGATIVE ng/mL (ref <0.10)
Cocaine: NEGATIVE ng/mL (ref <5.0)
Codeine: NEGATIVE ng/mL (ref <2.5)
DIHYDROCODEINE: NEGATIVE ng/mL (ref <2.5)
Fentanyl: NEGATIVE ng/mL (ref <0.10)
Heroin Metabolite: NEGATIVE ng/mL (ref <1.0)
Hydrocodone: NEGATIVE ng/mL (ref <2.5)
Hydromorphone: NEGATIVE ng/mL (ref <2.5)
MARIJUANA: NEGATIVE ng/mL (ref <2.5)
MDMA: NEGATIVE ng/mL (ref <10)
MORPHINE: NEGATIVE ng/mL (ref <2.5)
Meprobamate: NEGATIVE ng/mL (ref <2.5)
Methadone: NEGATIVE ng/mL (ref <5.0)
Nicotine Metabolite: NEGATIVE ng/mL (ref <5.0)
Norhydrocodone: NEGATIVE ng/mL (ref <2.5)
Noroxycodone: 9.5 ng/mL — ABNORMAL HIGH (ref <2.5)
Opiates: POSITIVE ng/mL — AB (ref <2.5)
Oxycodone: 160 ng/mL — ABNORMAL HIGH (ref <2.5)
Oxymorphone: NEGATIVE ng/mL (ref <2.5)
Phencyclidine: NEGATIVE ng/mL (ref <10)
Tapentadol: NEGATIVE ng/mL (ref <5.0)
Tramadol: NEGATIVE ng/mL (ref <5.0)
Zolpidem: NEGATIVE ng/mL (ref <5.0)

## 2019-01-07 LAB — DRUG TOX ALC METAB W/CON, ORAL FLD: Alcohol Metabolite: NEGATIVE ng/mL (ref <25)

## 2019-01-08 ENCOUNTER — Telehealth: Payer: Self-pay | Admitting: *Deleted

## 2019-01-08 NOTE — Telephone Encounter (Signed)
Oral swab drug screen was consistent for prescribed medications.  ?

## 2019-01-12 DIAGNOSIS — F411 Generalized anxiety disorder: Secondary | ICD-10-CM | POA: Diagnosis not present

## 2019-01-12 DIAGNOSIS — F332 Major depressive disorder, recurrent severe without psychotic features: Secondary | ICD-10-CM | POA: Diagnosis not present

## 2019-01-19 DIAGNOSIS — F411 Generalized anxiety disorder: Secondary | ICD-10-CM | POA: Diagnosis not present

## 2019-01-19 DIAGNOSIS — F332 Major depressive disorder, recurrent severe without psychotic features: Secondary | ICD-10-CM | POA: Diagnosis not present

## 2019-01-30 DIAGNOSIS — F332 Major depressive disorder, recurrent severe without psychotic features: Secondary | ICD-10-CM | POA: Diagnosis not present

## 2019-01-30 DIAGNOSIS — F411 Generalized anxiety disorder: Secondary | ICD-10-CM | POA: Diagnosis not present

## 2019-02-04 DIAGNOSIS — F411 Generalized anxiety disorder: Secondary | ICD-10-CM | POA: Diagnosis not present

## 2019-02-04 DIAGNOSIS — F332 Major depressive disorder, recurrent severe without psychotic features: Secondary | ICD-10-CM | POA: Diagnosis not present

## 2019-02-10 DIAGNOSIS — F411 Generalized anxiety disorder: Secondary | ICD-10-CM | POA: Diagnosis not present

## 2019-02-10 DIAGNOSIS — F332 Major depressive disorder, recurrent severe without psychotic features: Secondary | ICD-10-CM | POA: Diagnosis not present

## 2019-02-16 DIAGNOSIS — F411 Generalized anxiety disorder: Secondary | ICD-10-CM | POA: Diagnosis not present

## 2019-02-16 DIAGNOSIS — F332 Major depressive disorder, recurrent severe without psychotic features: Secondary | ICD-10-CM | POA: Diagnosis not present

## 2019-02-25 DIAGNOSIS — F332 Major depressive disorder, recurrent severe without psychotic features: Secondary | ICD-10-CM | POA: Diagnosis not present

## 2019-02-25 DIAGNOSIS — F411 Generalized anxiety disorder: Secondary | ICD-10-CM | POA: Diagnosis not present

## 2019-02-27 ENCOUNTER — Other Ambulatory Visit: Payer: Self-pay

## 2019-02-27 ENCOUNTER — Encounter: Payer: Medicare Other | Attending: Physical Medicine & Rehabilitation | Admitting: Registered Nurse

## 2019-02-27 ENCOUNTER — Encounter: Payer: Self-pay | Admitting: Registered Nurse

## 2019-02-27 VITALS — BP 107/74 | HR 82 | Resp 14 | Ht 65.0 in | Wt 194.0 lb

## 2019-02-27 DIAGNOSIS — S069X0S Unspecified intracranial injury without loss of consciousness, sequela: Secondary | ICD-10-CM | POA: Diagnosis not present

## 2019-02-27 DIAGNOSIS — G8929 Other chronic pain: Secondary | ICD-10-CM | POA: Diagnosis not present

## 2019-02-27 DIAGNOSIS — Z9889 Other specified postprocedural states: Secondary | ICD-10-CM | POA: Diagnosis not present

## 2019-02-27 DIAGNOSIS — R51 Headache: Secondary | ICD-10-CM | POA: Insufficient documentation

## 2019-02-27 DIAGNOSIS — F419 Anxiety disorder, unspecified: Secondary | ICD-10-CM | POA: Insufficient documentation

## 2019-02-27 DIAGNOSIS — R42 Dizziness and giddiness: Secondary | ICD-10-CM | POA: Insufficient documentation

## 2019-02-27 DIAGNOSIS — S069X2S Unspecified intracranial injury with loss of consciousness of 31 minutes to 59 minutes, sequela: Secondary | ICD-10-CM

## 2019-02-27 DIAGNOSIS — S82892S Other fracture of left lower leg, sequela: Secondary | ICD-10-CM

## 2019-02-27 DIAGNOSIS — Z5181 Encounter for therapeutic drug level monitoring: Secondary | ICD-10-CM | POA: Diagnosis not present

## 2019-02-27 DIAGNOSIS — S12100A Unspecified displaced fracture of second cervical vertebra, initial encounter for closed fracture: Secondary | ICD-10-CM | POA: Insufficient documentation

## 2019-02-27 DIAGNOSIS — F068 Other specified mental disorders due to known physiological condition: Secondary | ICD-10-CM | POA: Diagnosis not present

## 2019-02-27 DIAGNOSIS — S069X0A Unspecified intracranial injury without loss of consciousness, initial encounter: Secondary | ICD-10-CM | POA: Diagnosis not present

## 2019-02-27 DIAGNOSIS — R410 Disorientation, unspecified: Secondary | ICD-10-CM | POA: Diagnosis not present

## 2019-02-27 DIAGNOSIS — Z79891 Long term (current) use of opiate analgesic: Secondary | ICD-10-CM | POA: Diagnosis not present

## 2019-02-27 DIAGNOSIS — S060X9S Concussion with loss of consciousness of unspecified duration, sequela: Secondary | ICD-10-CM | POA: Diagnosis not present

## 2019-02-27 DIAGNOSIS — Z79899 Other long term (current) drug therapy: Secondary | ICD-10-CM | POA: Diagnosis not present

## 2019-02-27 DIAGNOSIS — R011 Cardiac murmur, unspecified: Secondary | ICD-10-CM | POA: Insufficient documentation

## 2019-02-27 DIAGNOSIS — Z7722 Contact with and (suspected) exposure to environmental tobacco smoke (acute) (chronic): Secondary | ICD-10-CM | POA: Diagnosis not present

## 2019-02-27 DIAGNOSIS — S82871A Displaced pilon fracture of right tibia, initial encounter for closed fracture: Secondary | ICD-10-CM | POA: Insufficient documentation

## 2019-02-27 DIAGNOSIS — D649 Anemia, unspecified: Secondary | ICD-10-CM | POA: Diagnosis not present

## 2019-02-27 DIAGNOSIS — S82891S Other fracture of right lower leg, sequela: Secondary | ICD-10-CM | POA: Diagnosis not present

## 2019-02-27 DIAGNOSIS — R252 Cramp and spasm: Secondary | ICD-10-CM | POA: Diagnosis not present

## 2019-02-27 DIAGNOSIS — G894 Chronic pain syndrome: Secondary | ICD-10-CM

## 2019-02-27 DIAGNOSIS — S82872A Displaced pilon fracture of left tibia, initial encounter for closed fracture: Secondary | ICD-10-CM | POA: Diagnosis not present

## 2019-02-27 DIAGNOSIS — F329 Major depressive disorder, single episode, unspecified: Secondary | ICD-10-CM | POA: Diagnosis not present

## 2019-02-27 MED ORDER — OXYCODONE HCL 10 MG PO TABS: 10.0000 mg | ORAL_TABLET | Freq: Three times a day (TID) | ORAL | 0 refills | Status: DC | PRN

## 2019-02-27 NOTE — Patient Instructions (Signed)
[facility]    [address] [place], [place] [address]  Hours:  Open now   Add full hours     Phone: [phone]

## 2019-02-27 NOTE — Progress Notes (Signed)
Subjective:    Patient ID: Monique [name], female    DOB: January 03, 1986, 33 y.o.   MRN: [id number]  HPI: Monique [name] is a 33 y.o. female who returns for follow up appointment for chronic pain and medication refill. She states her pain is located in her bilateral feet R>L. She rates her  Pain 3. Her. current exercise regime is walking and light household chores.  [name] asked about obtaing a PCP, she was given [place] phone number at her last visit, she was given the number again today. She was encouraged to call to obtain a PCP she verbalizes understanding. She reports she has DM in her family and at times she experienced dizziness with activity. She admits at times she forgets to eat and realizes she needs to stay on her schedule due to her TBI. She was encouraged to journal and to purchase a blood pressure apparatus, she verbalizes understanding. She also states she will call [facility] today.   She denies dizziness at this time.   [name] equivalent is 37.50  MME.  Last oral swab was performed on 01/02/2019, it was consistent.   Pain Inventory Average Pain 8 Pain Right Now 3 My pain is constant, sharp, stabbing and aching  In the last 24 hours, has pain interfered with the following? General activity 8 Relation with others 8 Enjoyment of life 8 What TIME of day is your pain at its worst? daytime, evening, night Sleep (in general) Fair  Pain is worse with: walking, bending, standing and some activites Pain improves with: rest, therapy/exercise, pacing activities and medication Relief from Meds: 8  Mobility walk without assistance ability to climb steps?  yes do you drive?  yes Do you have any goals in this area?  no  Function disabled: date disabled . Do you have any goals in this area?  no  Neuro/Psych weakness spasms dizziness  Prior Studies Any changes since last visit?  no  Physicians involved in your care Any changes since last  visit?  no   Family History  Problem Relation Age of Onset  . Cancer Paternal Grandmother        Ovarian   Social History   Socioeconomic History  . Marital status: Single    Spouse name: Not on file  . Number of children: Not on file  . Years of education: Not on file  . Highest education level: Not on file  Occupational History  . Not on file  Social Needs  . Financial resource strain: Not on file  . Food insecurity:    Worry: Not on file    Inability: Not on file  . Transportation needs:    Medical: Not on file    Non-medical: Not on file  Tobacco Use  . Smoking status: Passive Smoke Exposure - Never Smoker  . Smokeless tobacco: Never Used  Substance and Sexual Activity  . Alcohol use: Yes    Comment: occasional  . Drug use: No  . Sexual activity: Yes  Lifestyle  . Physical activity:    Days per week: Not on file    Minutes per session: Not on file  . Stress: Not on file  Relationships  . Social connections:    Talks on phone: Not on file    Gets together: Not on file    Attends religious service: Not on file    Active member of club or organization: Not on file    Attends meetings of clubs or organizations: Not  on file    Relationship status: Not on file  Other Topics Concern  . Not on file  Social History Narrative  . Not on file   Past Surgical History:  Procedure Laterality Date  . APPENDECTOMY    . Ceasarean section  2007, 2010,2014  . EXTERNAL FIXATION LEG Bilateral 12/14/2012   Procedure: EXTERNAL FIXATION LEG;  Surgeon: [name], MD;  Location: [place];  Service: Orthopedics;  Laterality: Bilateral;  . EXTERNAL FIXATION REMOVAL Bilateral 12/23/2012   Procedure: REMOVAL EXTERNAL FIXATION LEG;  Surgeon: [name], MD;  Location: [place];  Service: Orthopedics;  Laterality: Bilateral;  . I&D EXTREMITY Left 12/14/2012   Procedure: IRRIGATION AND DEBRIDEMENT EXTREMITY;  Surgeon: [name], MD;  Location: [place];  Service: Orthopedics;   Laterality: Left;  . ORIF ANKLE FRACTURE Left 12/14/2012   Procedure: OPEN REDUCTION INTERNAL FIXATION (ORIF) ANKLE FRACTURE;  Surgeon: [name], MD;  Location: [place];  Service: Orthopedics;  Laterality: Left;  . ORIF TIBIA FRACTURE Bilateral 12/23/2012   Procedure: OPEN REDUCTION INTERNAL FIXATION (ORIF) BILATERAL DISTAL TIBIA FRACTURE;  Surgeon: [name], MD;  Location: [place];  Service: Orthopedics;  Laterality: Bilateral;   Past Medical History:  Diagnosis Date  . Anemia   . Anxiety   . Depression   . Heart murmur    BP 107/74   Pulse 82   Resp 14   Ht 5\' 5"  (1.651 m)   Wt 194 lb (88 kg)   SpO2 98%   BMI 32.28 kg/m   Opioid Risk Score:   Fall Risk Score:  `1  Depression screen PHQ 2/9  Depression screen [facility] 2/9 11/05/2018 11/11/2017 07/15/2017 12/24/2014  Decreased Interest 1 0 0 2  Down, Depressed, Hopeless 1 0 0 2  PHQ - 2 Score 2 0 0 4  Altered sleeping - - - 3  Tired, decreased energy - - - 3  Change in appetite - - - 2  Feeling bad or failure about yourself  - - - 2  Trouble concentrating - - - 3  Moving slowly or fidgety/restless - - - 3  Suicidal thoughts - - - 0  PHQ-9 Score - - - 20    Review of Systems  Constitutional: Negative.   HENT: Negative.   Eyes: Negative.   Respiratory: Negative.   Cardiovascular: Negative.   Gastrointestinal: Negative.   Endocrine: Negative.   Genitourinary: Negative.   Musculoskeletal:       Spasms   Skin: Negative.   Allergic/Immunologic: Negative.   Neurological: Positive for dizziness and weakness.  Psychiatric/Behavioral: Negative.   All other systems reviewed and are negative.      Objective:   Physical Exam Vitals signs and nursing note reviewed.  Constitutional:      Appearance: Normal appearance.  Neck:     Musculoskeletal: Normal range of motion and neck supple.  Cardiovascular:     Rate and Rhythm: Normal rate and regular rhythm.     Pulses: Normal pulses.     Heart sounds: Normal heart  sounds.  Pulmonary:     Effort: Pulmonary effort is normal.     Breath sounds: Normal breath sounds.  Musculoskeletal:     Comments: Normal Muscle Bulk and Muscle Testing Reveals:  Upper Extremities: Full ROM and Muscle Strength 5/5 Lower Extremities: Full ROM and Muscle Strength 5/5 Arises from table with ease Narrow Based  Gait   Skin:    General: Skin is warm and dry.  Neurological:     Mental Status: She is alert and oriented to person, place, and time.  Psychiatric:        Mood and Affect: Mood normal.        Behavior: Behavior normal.           Assessment & Plan:  1.TBI:Continue to Monitor.02/27/2019 Continue with current treatment:using the various devices to help with your Organization such as notes, calendars and using her cell phone to set reminders. 2. Chronic Pain Syndrome: Refilled:Slow Weaning: Oxycodone 10 mg one tablet TID as needed as needed #75. Second scripte-scribefor the following month.02/27/2019 3. Bilateral Pilon Fractures: Continueto Monitor. 02/27/2019 4. Tibial Nerve Injuries: Continue to monitor.02/27/2019 5. Migraine:No complaints today. Continue :Naproxen:Continue to monitor. 02/27/2019  15 minutes of face to face patient care time was spent during this visit. All questions were encouraged and answered.  F/U Visit in 2 months

## 2019-03-03 DIAGNOSIS — F332 Major depressive disorder, recurrent severe without psychotic features: Secondary | ICD-10-CM | POA: Diagnosis not present

## 2019-03-03 DIAGNOSIS — F411 Generalized anxiety disorder: Secondary | ICD-10-CM | POA: Diagnosis not present

## 2019-03-09 DIAGNOSIS — F332 Major depressive disorder, recurrent severe without psychotic features: Secondary | ICD-10-CM | POA: Diagnosis not present

## 2019-03-09 DIAGNOSIS — F411 Generalized anxiety disorder: Secondary | ICD-10-CM | POA: Diagnosis not present

## 2019-03-17 DIAGNOSIS — F332 Major depressive disorder, recurrent severe without psychotic features: Secondary | ICD-10-CM | POA: Diagnosis not present

## 2019-03-17 DIAGNOSIS — F411 Generalized anxiety disorder: Secondary | ICD-10-CM | POA: Diagnosis not present

## 2019-03-23 DIAGNOSIS — F411 Generalized anxiety disorder: Secondary | ICD-10-CM | POA: Diagnosis not present

## 2019-03-23 DIAGNOSIS — F332 Major depressive disorder, recurrent severe without psychotic features: Secondary | ICD-10-CM | POA: Diagnosis not present

## 2019-03-30 DIAGNOSIS — F332 Major depressive disorder, recurrent severe without psychotic features: Secondary | ICD-10-CM | POA: Diagnosis not present

## 2019-03-30 DIAGNOSIS — F411 Generalized anxiety disorder: Secondary | ICD-10-CM | POA: Diagnosis not present

## 2019-04-01 ENCOUNTER — Telehealth: Payer: Self-pay
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
## 2019-04-02 ENCOUNTER — Ambulatory Visit (INDEPENDENT_AMBULATORY_CARE_PROVIDER_SITE_OTHER): Payer: Medicare Other | Admitting: Family Medicine

## 2019-04-02 DIAGNOSIS — Z5329 Procedure and treatment not carried out because of patient's decision for other reasons: Secondary | ICD-10-CM

## 2019-04-02 NOTE — Progress Notes (Signed)
Pt no-showed for this appt. 

## 2019-04-06 DIAGNOSIS — F411 Generalized anxiety disorder: Secondary | ICD-10-CM | POA: Diagnosis not present

## 2019-04-06 DIAGNOSIS — F332 Major depressive disorder, recurrent severe without psychotic features: Secondary | ICD-10-CM | POA: Diagnosis not present

## 2019-04-13 DIAGNOSIS — F332 Major depressive disorder, recurrent severe without psychotic features: Secondary | ICD-10-CM | POA: Diagnosis not present

## 2019-04-13 DIAGNOSIS — F411 Generalized anxiety disorder: Secondary | ICD-10-CM | POA: Diagnosis not present

## 2019-04-20 DIAGNOSIS — F411 Generalized anxiety disorder: Secondary | ICD-10-CM | POA: Diagnosis not present

## 2019-04-20 DIAGNOSIS — F332 Major depressive disorder, recurrent severe without psychotic features: Secondary | ICD-10-CM | POA: Diagnosis not present

## 2019-04-24 ENCOUNTER — Encounter: Payer: Self-pay | Admitting: Family Medicine

## 2019-04-24 ENCOUNTER — Ambulatory Visit (INDEPENDENT_AMBULATORY_CARE_PROVIDER_SITE_OTHER): Payer: Medicare Other | Admitting: Family Medicine

## 2019-04-24 VITALS — BP 100/80 | HR 86 | Temp 99.2°F | Ht 65.0 in | Wt 192.4 lb

## 2019-04-24 DIAGNOSIS — K5900 Constipation, unspecified: Secondary | ICD-10-CM | POA: Diagnosis not present

## 2019-04-24 DIAGNOSIS — Z862 Personal history of diseases of the blood and blood-forming organs and certain disorders involving the immune mechanism: Secondary | ICD-10-CM | POA: Diagnosis not present

## 2019-04-24 DIAGNOSIS — R5383 Other fatigue: Secondary | ICD-10-CM

## 2019-04-24 DIAGNOSIS — Z0000 Encounter for general adult medical examination without abnormal findings: Secondary | ICD-10-CM

## 2019-04-24 NOTE — Progress Notes (Addendum)
Monique [name] is a 33 y.o. female  Chief Complaint  Patient presents with  . Establish Care    est care/ having digestive issues (allergy testing)and wants iron tested     HPI: Monique [name] is a 33 y.o. female here to establish care with our office. She recently moved from [place]. She is scheduled for CPE, PAP.   She complains of lack of energy, lightheadedness x 6-69mo. Pt notes a family h/o iron deficiency anemia (mother) and pt w/ iron deficiency anemia in pregnancy. Pt would like iron studies done.   She also complains of "digestive issues". She has BM on average every other day. She often has to strain to have a BM. She has formed stools. No blood in stool.  Pt limits her gluten intake as well as red meat since 05/2018. She switched to almond milk as well. Appetite is good. No n/v/abd pain. No abd cramping or boating. No pain with BM.  + GERD - intermittent, typically related to acidic foods.  Pt feels she drinks plenty of water and estimates at least 48oz per day.   Specialists: she follows with PM&R every 2 months  Last CPE, labs: few years   Last PAP: due - no h/o abnormal PAP   Past Medical History:  Diagnosis Date  . Anemia   . Anxiety   . Depression   . Heart murmur     Past Surgical History:  Procedure Laterality Date  . APPENDECTOMY    . Ceasarean section  2007, 2010,2014  . EXTERNAL FIXATION LEG Bilateral 12/14/2012   Procedure: EXTERNAL FIXATION LEG;  Surgeon: [name], MD;  Location: [place] ;  Service: Orthopedics;  Laterality: Bilateral;  . EXTERNAL FIXATION REMOVAL Bilateral 12/23/2012   Procedure: REMOVAL EXTERNAL FIXATION LEG;  Surgeon: [name], MD;  Location: [place];  Service: Orthopedics;  Laterality: Bilateral;  . I&D EXTREMITY Left 12/14/2012   Procedure: IRRIGATION AND DEBRIDEMENT EXTREMITY;  Surgeon: [name], MD;  Location: [place];  Service: Orthopedics;  Laterality: Left;  . ORIF ANKLE FRACTURE Left 12/14/2012   Procedure: OPEN REDUCTION INTERNAL FIXATION (ORIF) ANKLE FRACTURE;  Surgeon: [name], MD;  Location: [place];  Service: Orthopedics;  Laterality: Left;  . ORIF TIBIA FRACTURE Bilateral 12/23/2012   Procedure: OPEN REDUCTION INTERNAL FIXATION (ORIF) BILATERAL DISTAL TIBIA FRACTURE;  Surgeon: [name], MD;  Location: [place];  Service: Orthopedics;  Laterality: Bilateral;    Social History   Socioeconomic History  . Marital status: Single    Spouse name: Not on file  . Number of children: Not on file  . Years of education: Not on file  . Highest education level: Not on file  Occupational History  . Not on file  Social Needs  . Financial resource strain: Not on file  . Food insecurity    Worry: Not on file    Inability: Not on file  . Transportation needs    Medical: Not on file    Non-medical: Not on file  Tobacco Use  . Smoking status: Passive Smoke Exposure - Never Smoker  . Smokeless tobacco: Never Used  Substance and Sexual Activity  . Alcohol use: Yes    Comment: occasional  . Drug use: No  . Sexual activity: Yes  Lifestyle  . Physical activity    Days per week: Not on file    Minutes per session: Not on file  . Stress: Not on file  Relationships  . Social connections  Talks on phone: Not on file    Gets together: Not on file    Attends religious service: Not on file    Active member of club or organization: Not on file    Attends meetings of clubs or organizations: Not on file    Relationship status: Not on file  . Intimate partner violence    Fear of current or ex partner: Not on file    Emotionally abused: Not on file    Physically abused: Not on file    Forced sexual activity: Not on file  Other Topics Concern  . Not on file  Social History Narrative  . Not on file    Family History  Problem Relation Age of Onset  . Cancer Paternal Grandmother        Ovarian     Immunization History  Administered Date(s) Administered  . Td 12/14/2012     Outpatient Encounter Medications as of 04/24/2019  Medication Sig  . naproxen (NAPROSYN) 500 MG tablet Take 1 tablet (500 mg total) by mouth 2 (two) times daily between meals as needed.  . Oxycodone HCl 10 MG TABS Take 1 tablet (10 mg total) by mouth 3 (three) times daily as needed.   No facility-administered encounter medications on file as of 04/24/2019.      ROS: Pertinent positives and negatives noted in HPI. Remainder of ROS non-contributory    No Known Allergies  BP 100/80   Pulse 86   Temp 99.2 F (37.3 C) (Oral)   Ht 5\' 5"  (1.651 m)   Wt 192 lb 6.4 oz (87.3 kg)   SpO2 97%   BMI 32.02 kg/m   BP Readings from Last 3 Encounters:  04/27/19 108/72  04/24/19 100/80  02/27/19 107/74   Wt Readings from Last 3 Encounters:  04/27/19 194 lb (88 kg)  04/24/19 192 lb 6.4 oz (87.3 kg)  02/27/19 194 lb (88 kg)     Physical Exam  Constitutional: She is oriented to person, place, and time. She appears well-developed and well-nourished. No distress.  Neck: No thyromegaly present.  Cardiovascular: Normal rate and regular rhythm.  Pulmonary/Chest: Effort normal and breath sounds normal.  Abdominal: Soft. Bowel sounds are normal. She exhibits no distension and no mass. There is no abdominal tenderness. There is no rebound and no guarding.  Lymphadenopathy:    She has no cervical adenopathy.  Neurological: She is alert and oriented to person, place, and time.  Skin: Skin is warm and dry.     A/P:  1. Annual physical exam - pt has appt scheduled on 8/7 for CPE, PAP. She would like to RTO for fasting labs prior to that appt - ALT; Future - AST; Future - Basic metabolic panel; Future - VITAMIN D 25 Hydroxy (Vit-D Deficiency, Fractures); Future - Lipid panel; Future  2. Fatigue, unspecified type - could be d/t anemia as she has a h/o iron deficiency anemia but will also check additional labs - TSH; Future - T4, free; Future - Vitamin B12; Future - CBC; Future - Iron,  TIBC and Ferritin Panel; Future  3. History of iron deficiency anemia - not currently taking iron supplement - pt complains of fatigue - CBC; Future - Iron, TIBC and Ferritin Panel; Future  4. Constipation - could be d/t oxycodone 10mg  daily - recommend increased dietary fiber or fiber supplement, miralax daily or every other day, and can consider probiotic - increase water intake

## 2019-04-24 NOTE — Patient Instructions (Signed)
Drink plenty of water - 48-64oz per day Miralax - 1/2 capful to 1 capful in 6oz water every other day, can increase to decrease from there; goal is 1 soft, easy to pass BM  every 1-2 days  Probiotic - Align (acidophilus) or lactobacillus

## 2019-04-27 ENCOUNTER — Encounter: Payer: Self-pay | Admitting: Registered Nurse

## 2019-04-27 ENCOUNTER — Other Ambulatory Visit: Payer: Self-pay

## 2019-04-27 ENCOUNTER — Encounter: Payer: Medicare Other | Attending: Physical Medicine & Rehabilitation | Admitting: Registered Nurse

## 2019-04-27 VITALS — BP 108/72 | HR 88 | Temp 97.5°F | Ht 65.0 in | Wt 194.0 lb

## 2019-04-27 DIAGNOSIS — Z79899 Other long term (current) drug therapy: Secondary | ICD-10-CM | POA: Diagnosis not present

## 2019-04-27 DIAGNOSIS — S060X9S Concussion with loss of consciousness of unspecified duration, sequela: Secondary | ICD-10-CM

## 2019-04-27 DIAGNOSIS — G8929 Other chronic pain: Secondary | ICD-10-CM | POA: Insufficient documentation

## 2019-04-27 DIAGNOSIS — S069X0A Unspecified intracranial injury without loss of consciousness, initial encounter: Secondary | ICD-10-CM | POA: Diagnosis not present

## 2019-04-27 DIAGNOSIS — R410 Disorientation, unspecified: Secondary | ICD-10-CM | POA: Diagnosis not present

## 2019-04-27 DIAGNOSIS — S82872A Displaced pilon fracture of left tibia, initial encounter for closed fracture: Secondary | ICD-10-CM | POA: Diagnosis not present

## 2019-04-27 DIAGNOSIS — S82871A Displaced pilon fracture of right tibia, initial encounter for closed fracture: Secondary | ICD-10-CM | POA: Diagnosis not present

## 2019-04-27 DIAGNOSIS — R011 Cardiac murmur, unspecified: Secondary | ICD-10-CM | POA: Diagnosis not present

## 2019-04-27 DIAGNOSIS — S069X2S Unspecified intracranial injury with loss of consciousness of 31 minutes to 59 minutes, sequela: Secondary | ICD-10-CM | POA: Diagnosis not present

## 2019-04-27 DIAGNOSIS — G894 Chronic pain syndrome: Secondary | ICD-10-CM | POA: Diagnosis not present

## 2019-04-27 DIAGNOSIS — Z9889 Other specified postprocedural states: Secondary | ICD-10-CM | POA: Insufficient documentation

## 2019-04-27 DIAGNOSIS — Z79891 Long term (current) use of opiate analgesic: Secondary | ICD-10-CM

## 2019-04-27 DIAGNOSIS — F068 Other specified mental disorders due to known physiological condition: Secondary | ICD-10-CM | POA: Diagnosis not present

## 2019-04-27 DIAGNOSIS — R42 Dizziness and giddiness: Secondary | ICD-10-CM | POA: Diagnosis not present

## 2019-04-27 DIAGNOSIS — S82891S Other fracture of right lower leg, sequela: Secondary | ICD-10-CM

## 2019-04-27 DIAGNOSIS — Z7722 Contact with and (suspected) exposure to environmental tobacco smoke (acute) (chronic): Secondary | ICD-10-CM | POA: Insufficient documentation

## 2019-04-27 DIAGNOSIS — Z5181 Encounter for therapeutic drug level monitoring: Secondary | ICD-10-CM | POA: Diagnosis not present

## 2019-04-27 DIAGNOSIS — R51 Headache: Secondary | ICD-10-CM | POA: Diagnosis not present

## 2019-04-27 DIAGNOSIS — S12100A Unspecified displaced fracture of second cervical vertebra, initial encounter for closed fracture: Secondary | ICD-10-CM | POA: Diagnosis not present

## 2019-04-27 DIAGNOSIS — R252 Cramp and spasm: Secondary | ICD-10-CM | POA: Diagnosis not present

## 2019-04-27 DIAGNOSIS — S82892S Other fracture of left lower leg, sequela: Secondary | ICD-10-CM | POA: Diagnosis not present

## 2019-04-27 DIAGNOSIS — D649 Anemia, unspecified: Secondary | ICD-10-CM | POA: Insufficient documentation

## 2019-04-27 DIAGNOSIS — F329 Major depressive disorder, single episode, unspecified: Secondary | ICD-10-CM | POA: Diagnosis not present

## 2019-04-27 DIAGNOSIS — F419 Anxiety disorder, unspecified: Secondary | ICD-10-CM | POA: Insufficient documentation

## 2019-04-27 DIAGNOSIS — S069X0S Unspecified intracranial injury without loss of consciousness, sequela: Secondary | ICD-10-CM

## 2019-04-27 MED ORDER — OXYCODONE HCL 10 MG PO TABS: 10.0000 mg | ORAL_TABLET | Freq: Three times a day (TID) | ORAL | 0 refills | Status: DC | PRN

## 2019-04-27 NOTE — Progress Notes (Signed)
Subjective:    Patient ID: Monique [name], female    DOB: [birth year], 33 y.o.   MRN: [id number]  HPI: [name] is a 33 y.o. female who returns for follow up appointment for chronic pain and medication refill. She states her pain is located in her bilateral feet R>L. She rates her pain 6. Her current exercise regime is walking.  Ms. Dant Morphine equivalent is 45.00 MME. Last Oral Swab was Performed on 01/02/2019, it was consistent.   Pain Inventory Average Pain 6 Pain Right Now 6 My pain is intermittent, sharp, stabbing and aching  In the last 24 hours, has pain interfered with the following? General activity 8 Relation with others 8 Enjoyment of life 8 What TIME of day is your pain at its worst? daytime evening night Sleep (in general) NA  Pain is worse with: walking, bending, standing and some activites Pain improves with: medication Relief from Meds: 9  Mobility walk without assistance ability to climb steps?  yes do you drive?  yes  Function disabled: date disabled .  Neuro/Psych weakness spasms dizziness  Prior Studies Any changes since last visit?  no  Physicians involved in your care Any changes since last visit?  no   Family History  Problem Relation Age of Onset  . Cancer Paternal Grandmother        Ovarian   Social History   Socioeconomic History  . Marital status: Single    Spouse name: Not on file  . Number of children: Not on file  . Years of education: Not on file  . Highest education level: Not on file  Occupational History  . Not on file  Social Needs  . Financial resource strain: Not on file  . Food insecurity    Worry: Not on file    Inability: Not on file  . Transportation needs    Medical: Not on file    Non-medical: Not on file  Tobacco Use  . Smoking status: Passive Smoke Exposure - Never Smoker  . Smokeless tobacco: Never Used  Substance and Sexual Activity  . Alcohol use: Yes    Comment: occasional   . Drug use: No  . Sexual activity: Yes  Lifestyle  . Physical activity    Days per week: Not on file    Minutes per session: Not on file  . Stress: Not on file  Relationships  . Social Herbalist on phone: Not on file    Gets together: Not on file    Attends religious service: Not on file    Active member of club or organization: Not on file    Attends meetings of clubs or organizations: Not on file    Relationship status: Not on file  Other Topics Concern  . Not on file  Social History Narrative  . Not on file   Past Surgical History:  Procedure Laterality Date  . APPENDECTOMY    . Ceasarean section  2007, 2010,2014  . EXTERNAL FIXATION LEG Bilateral 12/14/2012   Procedure: EXTERNAL FIXATION LEG;  Surgeon: Monique [name];  Location: [place];  Service: Orthopedics;  Laterality: Bilateral;  . EXTERNAL FIXATION REMOVAL Bilateral 12/23/2012   Procedure: REMOVAL EXTERNAL FIXATION LEG;  Surgeon: Monique [name];  Location: [place];  Service: Orthopedics;  Laterality: Bilateral;  . I&D EXTREMITY Left 12/14/2012   Procedure: IRRIGATION AND DEBRIDEMENT EXTREMITY;  Surgeon: Monique [name];  Location: [place];  Service: Orthopedics;  Laterality: Left;  .  ORIF ANKLE FRACTURE Left 12/14/2012   Procedure: OPEN REDUCTION INTERNAL FIXATION (ORIF) ANKLE FRACTURE;  Surgeon: Monique [name];  Location: [place];  Service: Orthopedics;  Laterality: Left;  . ORIF TIBIA FRACTURE Bilateral 12/23/2012   Procedure: OPEN REDUCTION INTERNAL FIXATION (ORIF) BILATERAL DISTAL TIBIA FRACTURE;  Surgeon: Monique [name];  Location: [place];  Service: Orthopedics;  Laterality: Bilateral;   Past Medical History:  Diagnosis Date  . Anemia   . Anxiety   . Depression   . Heart murmur    BP 108/72   Pulse 88   Temp (!) 97.5 F (36.4 C)   Ht 5\' 5"  (1.651 m)   Wt 194 lb (88 kg)   SpO2 96%   BMI 32.28 kg/m   Opioid Risk Score:   Fall Risk Score:  `1  Depression screen PHQ 2/9  Depression  screen Monique [name] 2/9 04/27/2019 11/05/2018 11/11/2017 07/15/2017 12/24/2014  Decreased Interest 0 1 0 0 2  Down, Depressed, Hopeless 0 1 0 0 2  PHQ - 2 Score 0 2 0 0 4  Altered sleeping - - - - 3  Tired, decreased energy - - - - 3  Change in appetite - - - - 2  Feeling bad or failure about yourself  - - - - 2  Trouble concentrating - - - - 3  Moving slowly or fidgety/restless - - - - 3  Suicidal thoughts - - - - 0  PHQ-9 Score - - - - 20   Review of Systems  Constitutional: Negative.   HENT: Negative.   Eyes: Negative.   Respiratory: Negative.   Cardiovascular: Negative.   Gastrointestinal: Negative.   Endocrine: Negative.   Genitourinary: Negative.   Musculoskeletal:       Spasms  Skin: Negative.   Allergic/Immunologic: Negative.   Neurological: Positive for dizziness.  Hematological: Negative.   Psychiatric/Behavioral: Negative.   All other systems reviewed and are negative.      Objective:   Physical Exam Vitals signs and nursing note reviewed.  Constitutional:      Appearance: Normal appearance.  Neck:     Musculoskeletal: Normal range of motion and neck supple.  Cardiovascular:     Rate and Rhythm: Normal rate and regular rhythm.     Pulses: Normal pulses.     Heart sounds: Normal heart sounds.  Pulmonary:     Effort: Pulmonary effort is normal.     Breath sounds: Normal breath sounds.  Musculoskeletal:     Comments: Normal Muscle Bulk and Muscle Testing Reveals:  Upper Extremities: Full ROM and Muscle Strength 5/5  Lower Extremities : Full ROM and Muscle Strength 5/5 Arises from Table with ease Narrow Based Gait   Skin:    General: Skin is warm and dry.  Neurological:     Mental Status: She is alert and oriented to person, place, and time.  Psychiatric:        Mood and Affect: Mood normal.        Behavior: Behavior normal.           Assessment & Plan:  1.TBI:Continue to Monitor.04/27/2019 Continue with current treatment:using the various devices to  help with your Organization such as notes, calendars and using her cell phone to set reminders. 2. Chronic Pain Syndrome: Refilled:Oxycodone 10 mg one tablet TID as needed as needed #75. Second scripte-scribefor the following month.04/27/2019 3. Bilateral Pilon Fractures: Continueto Monitor. 04/27/2019 4. Tibial Nerve Injuries: Continue to monitor.04/27/2019 5. Migraine:No complaints today.Continue :Naproxen:Continue to  monitor. 04/27/2019  15 minutes of face to face patient care time was spent during this visit. All questions were encouraged and answered.  F/U Visit in 2 months

## 2019-04-28 ENCOUNTER — Other Ambulatory Visit (INDEPENDENT_AMBULATORY_CARE_PROVIDER_SITE_OTHER): Payer: Medicare Other

## 2019-04-28 ENCOUNTER — Other Ambulatory Visit: Payer: Self-pay

## 2019-04-28 DIAGNOSIS — Z0000 Encounter for general adult medical examination without abnormal findings: Secondary | ICD-10-CM

## 2019-04-28 DIAGNOSIS — F332 Major depressive disorder, recurrent severe without psychotic features: Secondary | ICD-10-CM | POA: Diagnosis not present

## 2019-04-28 DIAGNOSIS — R5383 Other fatigue: Secondary | ICD-10-CM

## 2019-04-28 DIAGNOSIS — F411 Generalized anxiety disorder: Secondary | ICD-10-CM | POA: Diagnosis not present

## 2019-04-28 DIAGNOSIS — Z862 Personal history of diseases of the blood and blood-forming organs and certain disorders involving the immune mechanism: Secondary | ICD-10-CM | POA: Diagnosis not present

## 2019-04-28 LAB — LIPID PANEL
Cholesterol: 166 mg/dL (ref 0–200)
HDL: 58.4 mg/dL (ref >39.00)
LDL Cholesterol: 96 mg/dL (ref 0–99)
NonHDL: 107.57
Total CHOL/HDL Ratio: 3
Triglycerides: 58 mg/dL (ref 0.0–149.0)
VLDL: 11.6 mg/dL (ref 0.0–40.0)

## 2019-04-28 LAB — VITAMIN B12: Vitamin B-12: 531 pg/mL (ref 211–911)

## 2019-04-28 LAB — CBC
HCT: 34.3 % — ABNORMAL LOW (ref 36.0–46.0)
Hemoglobin: 10.7 g/dL — ABNORMAL LOW (ref 12.0–15.0)
MCHC: 31.3 g/dL (ref 30.0–36.0)
MCV: 76.8 fl — ABNORMAL LOW (ref 78.0–100.0)
Platelets: 291 10*3/uL (ref 150.0–400.0)
RBC: 4.47 Mil/uL (ref 3.87–5.11)
RDW: 15.9 % — ABNORMAL HIGH (ref 11.5–15.5)
WBC: 4.2 10*3/uL (ref 4.0–10.5)

## 2019-04-28 LAB — BASIC METABOLIC PANEL
BUN: 10 mg/dL (ref 6–23)
CO2: 25 mEq/L (ref 19–32)
Calcium: 8.5 mg/dL (ref 8.4–10.5)
Chloride: 105 mEq/L (ref 96–112)
Creatinine, Ser: 0.81 mg/dL (ref 0.40–1.20)
GFR: 80.68 mL/min (ref >60.00)
Glucose, Bld: 110 mg/dL — ABNORMAL HIGH (ref 70–99)
Potassium: 3.8 mEq/L (ref 3.5–5.1)
Sodium: 138 mEq/L (ref 135–145)

## 2019-04-28 LAB — TSH: TSH: 1.04 u[IU]/mL (ref 0.35–4.50)

## 2019-04-28 LAB — VITAMIN D 25 HYDROXY (VIT D DEFICIENCY, FRACTURES): VITD: 22.17 ng/mL — ABNORMAL LOW (ref 30.00–100.00)

## 2019-04-28 LAB — ALT: ALT: 9 U/L (ref 0–35)

## 2019-04-28 LAB — AST: AST: 14 U/L (ref 0–37)

## 2019-04-28 LAB — T4, FREE: Free T4: 0.91 ng/dL (ref 0.60–1.60)

## 2019-04-29 ENCOUNTER — Other Ambulatory Visit: Payer: Self-pay | Admitting: Family Medicine

## 2019-04-29 ENCOUNTER — Encounter: Payer: Self-pay | Admitting: Family Medicine

## 2019-04-29 DIAGNOSIS — E559 Vitamin D deficiency, unspecified: Secondary | ICD-10-CM

## 2019-04-29 DIAGNOSIS — D509 Iron deficiency anemia, unspecified: Secondary | ICD-10-CM | POA: Insufficient documentation

## 2019-04-29 LAB — IRON,TIBC AND FERRITIN PANEL
%SAT: 9 % (calc) — ABNORMAL LOW (ref 16–45)
Ferritin: 4 ng/mL — ABNORMAL LOW (ref 16–154)
Iron: 33 ug/dL — ABNORMAL LOW (ref 40–190)
TIBC: 381 mcg/dL (calc) (ref 250–450)

## 2019-04-29 MED ORDER — VITAMIN D (ERGOCALCIFEROL) 1.25 MG (50000 UNIT) PO CAPS: 50000.0000 [IU] | ORAL_CAPSULE | ORAL | 2 refills | Status: DC

## 2019-05-04 DIAGNOSIS — F332 Major depressive disorder, recurrent severe without psychotic features: Secondary | ICD-10-CM | POA: Diagnosis not present

## 2019-05-04 DIAGNOSIS — F411 Generalized anxiety disorder: Secondary | ICD-10-CM | POA: Diagnosis not present

## 2019-05-11 DIAGNOSIS — F332 Major depressive disorder, recurrent severe without psychotic features: Secondary | ICD-10-CM | POA: Diagnosis not present

## 2019-05-11 DIAGNOSIS — F411 Generalized anxiety disorder: Secondary | ICD-10-CM | POA: Diagnosis not present

## 2019-05-14 ENCOUNTER — Telehealth: Payer: Self-pay
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
## 2019-05-15 ENCOUNTER — Encounter: Payer: Self-pay | Admitting: Family Medicine

## 2019-05-15 ENCOUNTER — Ambulatory Visit (INDEPENDENT_AMBULATORY_CARE_PROVIDER_SITE_OTHER): Payer: Medicare Other | Admitting: Family Medicine

## 2019-05-15 ENCOUNTER — Other Ambulatory Visit (HOSPITAL_COMMUNITY)
Admission: RE | Admit: 2019-05-15 | Discharge: 2019-05-15 | Disposition: A | Discharge status: Home / Self Care | Payer: Medicare Other | Source: Ambulatory Visit | Attending: Family Medicine | Admitting: Family Medicine

## 2019-05-15 VITALS — BP 118/70 | HR 73 | Temp 98.1°F | Ht 65.0 in | Wt 187.6 lb

## 2019-05-15 DIAGNOSIS — E559 Vitamin D deficiency, unspecified: Secondary | ICD-10-CM

## 2019-05-15 DIAGNOSIS — Z0000 Encounter for general adult medical examination without abnormal findings: Secondary | ICD-10-CM

## 2019-05-15 DIAGNOSIS — Z113 Encounter for screening for infections with a predominantly sexual mode of transmission: Secondary | ICD-10-CM

## 2019-05-15 DIAGNOSIS — Z124 Encounter for screening for malignant neoplasm of cervix: Secondary | ICD-10-CM

## 2019-05-15 DIAGNOSIS — Z1151 Encounter for screening for human papillomavirus (HPV): Secondary | ICD-10-CM | POA: Insufficient documentation

## 2019-05-15 LAB — POCT URINALYSIS DIPSTICK
Bilirubin, UA: NEGATIVE
Blood, UA: NEGATIVE
Glucose, UA: NEGATIVE
Leukocytes, UA: NEGATIVE
Nitrite, UA: NEGATIVE
Protein, UA: POSITIVE — AB
Spec Grav, UA: >=1.03 — AB (ref 1.010–1.025)
Urobilinogen, UA: 1 E.U./dL
pH, UA: 6 (ref 5.0–8.0)

## 2019-05-15 NOTE — Progress Notes (Signed)
Monique [name] is a 33 y.o. female  Chief Complaint  Patient presents with  . Annual Exam    CPE- labs done/ needs pap    HPI: Monique [name] is a 33 y.o. female here for CPE, PAP. She had labs done in 04/2019. Pt would like screening for GC/C as well as PAP. No known exposure or symptoms. Pt requests UA and feels at times she has to go more urgently. This is not consistent and no other urinary symptoms.   Last PAP: due  Diet/Exercise: no regular exercise, average diet  Med refills needed today? no  Past Medical History:  Diagnosis Date  . Anemia   . Anxiety   . Depression   . Heart murmur     Past Surgical History:  Procedure Laterality Date  . APPENDECTOMY    . Ceasarean section  2007, 2010,2014  . EXTERNAL FIXATION LEG Bilateral 12/14/2012   Procedure: EXTERNAL FIXATION LEG;  Surgeon: [name], MD;  Location: [place];  Service: Orthopedics;  Laterality: Bilateral;  . EXTERNAL FIXATION REMOVAL Bilateral 12/23/2012   Procedure: REMOVAL EXTERNAL FIXATION LEG;  Surgeon: [name], MD;  Location: [place];  Service: Orthopedics;  Laterality: Bilateral;  . I&D EXTREMITY Left 12/14/2012   Procedure: IRRIGATION AND DEBRIDEMENT EXTREMITY;  Surgeon: [name], MD;  Location: [place];  Service: Orthopedics;  Laterality: Left;  . ORIF ANKLE FRACTURE Left 12/14/2012   Procedure: OPEN REDUCTION INTERNAL FIXATION (ORIF) ANKLE FRACTURE;  Surgeon: [name], MD;  Location: [place];  Service: Orthopedics;  Laterality: Left;  . ORIF TIBIA FRACTURE Bilateral 12/23/2012   Procedure: OPEN REDUCTION INTERNAL FIXATION (ORIF) BILATERAL DISTAL TIBIA FRACTURE;  Surgeon: [name], MD;  Location: [place];  Service: Orthopedics;  Laterality: Bilateral;    Social History   Socioeconomic History  . Marital status: Single    Spouse name: Not on file  . Number of children: Not on file  . Years of education: Not on file  . Highest education level: Not on file  Occupational  History  . Not on file  Social Needs  . Financial resource strain: Not on file  . Food insecurity    Worry: Not on file    Inability: Not on file  . Transportation needs    Medical: Not on file    Non-medical: Not on file  Tobacco Use  . Smoking status: Passive Smoke Exposure - Never Smoker  . Smokeless tobacco: Never Used  Substance and Sexual Activity  . Alcohol use: Yes    Comment: occasional  . Drug use: No  . Sexual activity: Yes  Lifestyle  . Physical activity    Days per week: Not on file    Minutes per session: Not on file  . Stress: Not on file  Relationships  . Social Herbalist on phone: Not on file    Gets together: Not on file    Attends religious service: Not on file    Active member of club or organization: Not on file    Attends meetings of clubs or organizations: Not on file    Relationship status: Not on file  . Intimate partner violence    Fear of current or ex partner: Not on file    Emotionally abused: Not on file    Physically abused: Not on file    Forced sexual activity: Not on file  Other Topics Concern  . Not on file  Social History Narrative  .  Not on file    Family History  Problem Relation Age of Onset  . Cancer Paternal Grandmother        Ovarian     Immunization History  Administered Date(s) Administered  . Td 12/14/2012    Outpatient Encounter Medications as of 05/15/2019  Medication Sig  . naproxen (NAPROSYN) 500 MG tablet Take 1 tablet (500 mg total) by mouth 2 (two) times daily between meals as needed.  . Oxycodone HCl 10 MG TABS Take 1 tablet (10 mg total) by mouth 3 (three) times daily as needed. Do Not Fill Before 08/ 17/2020  . Vitamin D, Ergocalciferol, (DRISDOL) 1.25 MG (50000 UT) CAPS capsule Take 1 capsule (50,000 Units total) by mouth every 7 (seven) days.   No facility-administered encounter medications on file as of 05/15/2019.      ROS: Gen: no fever, chills  Skin: no rash, itching ENT: no ear pain,  ear drainage, nasal congestion, rhinorrhea, sinus pressure, sore throat Eyes: no blurry vision, double vision Resp: no cough, wheeze,SOB Breast: no breast tenderness, no nipple discharge, no breast masses CV: no CP, palpitations, LE edema,  GI: no heartburn, n/v/d/c, abd pain GU: no dysuria, urgency, frequency, hematuria; no vaginal itching, odor, discharge MSK: no joint pain, myalgias, back pain Neuro: no dizziness, headache, weakness, vertigo Psych: no depression, anxiety, insomnia   No Known Allergies  BP 118/70   Pulse 73   Temp 98.1 F (36.7 C) (Oral)   Ht 5\' 5"  (1.651 m)   Wt 187 lb 9.6 oz (85.1 kg)   SpO2 98%   BMI 31.22 kg/m   Physical Exam  Constitutional: She is oriented to person, place, and time. She appears well-developed and well-nourished. No distress.  HENT:  Head: Normocephalic and atraumatic.  Right Ear: Tympanic membrane and ear canal normal.  Left Ear: Tympanic membrane and ear canal normal.  Nose: Nose normal.  Mouth/Throat: Oropharynx is clear and moist and mucous membranes are normal.  Eyes: Pupils are equal, round, and reactive to light. Conjunctivae are normal.  Neck: Neck supple. No thyromegaly present.  Cardiovascular: Normal rate, regular rhythm, normal heart sounds and intact distal pulses.  No murmur heard. Pulmonary/Chest: Effort normal and breath sounds normal. No respiratory distress. She has no wheezes. She has no rhonchi. Right breast exhibits no inverted nipple, no mass, no nipple discharge, no skin change and no tenderness. Left breast exhibits no inverted nipple, no mass, no nipple discharge, no skin change and no tenderness.  Abdominal: Soft. Bowel sounds are normal. She exhibits no distension and no mass. There is no abdominal tenderness.  Genitourinary:    Vagina and uterus normal.  There is no rash, tenderness or lesion on the right labia. There is no rash, tenderness or lesion on the left labia. Cervix exhibits no motion tenderness, no  discharge and no friability. Right adnexum displays no mass, no tenderness and no fullness. Left adnexum displays no mass, no tenderness and no fullness.  Musculoskeletal:        General: No edema.  Lymphadenopathy:    She has no cervical adenopathy.  Neurological: She is alert and oriented to person, place, and time. She exhibits normal muscle tone. Coordination normal.  Skin: Skin is warm and dry.  Psychiatric: She has a normal mood and affect. Her behavior is normal.     A/P:  1. Annual physical exam - labs done previously and reviewed with patient - PAP today - POCT Urinalysis Dipstick - discussed importance of regular CV exercise,  healthy diet, adequate sleep - due for dental exam; pt does not wear glasses or contacts - immunizations UTD - next CPE in 1 year  2. Screening for cervical cancer - Cytology - PAP( [place])  3. Screening for STD (sexually transmitted disease) - Cytology - PAP( [place])  4. Vitamin D deficiency - pt Rx'd but not yet taking Vit D 50,000IU weekly

## 2019-05-15 NOTE — Patient Instructions (Signed)
Health Maintenance, Female Adopting a healthy lifestyle and getting preventive care are important in promoting health and wellness. Ask your health care provider about:  The right schedule for you to have regular tests and exams.  Things you can do on your own to prevent diseases and keep yourself healthy. What should I know about diet, weight, and exercise? Eat a healthy diet   Eat a diet that includes plenty of vegetables, fruits, low-fat dairy products, and lean protein.  Do not eat a lot of foods that are high in solid fats, added sugars, or sodium. Maintain a healthy weight Body mass index (BMI) is used to identify weight problems. It estimates body fat based on height and weight. Your health care provider can help determine your BMI and help you achieve or maintain a healthy weight. Get regular exercise Get regular exercise. This is one of the most important things you can do for your health. Most adults should:  Exercise for at least 150 minutes each week. The exercise should increase your heart rate and make you sweat (moderate-intensity exercise).  Do strengthening exercises at least twice a week. This is in addition to the moderate-intensity exercise.  Spend less time sitting. Even light physical activity can be beneficial. Watch cholesterol and blood lipids Have your blood tested for lipids and cholesterol at 34 years of age, then have this test every 5 years. Have your cholesterol levels checked more often if:  Your lipid or cholesterol levels are high.  You are older than 34 years of age.  You are at high risk for heart disease. What should I know about cancer screening? Depending on your health history and family history, you may need to have cancer screening at various ages. This may include screening for:  Breast cancer.  Cervical cancer.  Colorectal cancer.  Skin cancer.  Lung cancer. What should I know about heart disease, diabetes, and high blood  pressure? Blood pressure and heart disease  High blood pressure causes heart disease and increases the risk of stroke. This is more likely to develop in people who have high blood pressure readings, are of African descent, or are overweight.  Have your blood pressure checked: ? Every 3-5 years if you are 18-39 years of age. ? Every year if you are 40 years old or older. Diabetes Have regular diabetes screenings. This checks your fasting blood sugar level. Have the screening done:  Once every three years after age 40 if you are at a normal weight and have a low risk for diabetes.  More often and at a younger age if you are overweight or have a high risk for diabetes. What should I know about preventing infection? Hepatitis B If you have a higher risk for hepatitis B, you should be screened for this virus. Talk with your health care provider to find out if you are at risk for hepatitis B infection. Hepatitis C Testing is recommended for:  Everyone born from 1945 through 1965.  Anyone with known risk factors for hepatitis C. Sexually transmitted infections (STIs)  Get screened for STIs, including gonorrhea and chlamydia, if: ? You are sexually active and are younger than 34 years of age. ? You are older than 34 years of age and your health care provider tells you that you are at risk for this type of infection. ? Your sexual activity has changed since you were last screened, and you are at increased risk for chlamydia or gonorrhea. Ask your health care provider if   you are at risk.  Ask your health care provider about whether you are at high risk for HIV. Your health care provider may recommend a prescription medicine to help prevent HIV infection. If you choose to take medicine to prevent HIV, you should first get tested for HIV. You should then be tested every 3 months for as long as you are taking the medicine. Pregnancy  If you are about to stop having your period (premenopausal) and  you may become pregnant, seek counseling before you get pregnant.  Take 400 to 800 micrograms (mcg) of folic acid every day if you become pregnant.  Ask for birth control (contraception) if you want to prevent pregnancy. Osteoporosis and menopause Osteoporosis is a disease in which the bones lose minerals and strength with aging. This can result in bone fractures. If you are 65 years old or older, or if you are at risk for osteoporosis and fractures, ask your health care provider if you should:  Be screened for bone loss.  Take a calcium or vitamin D supplement to lower your risk of fractures.  Be given hormone replacement therapy (HRT) to treat symptoms of menopause. Follow these instructions at home: Lifestyle  Do not use any products that contain nicotine or tobacco, such as cigarettes, e-cigarettes, and chewing tobacco. If you need help quitting, ask your health care provider.  Do not use street drugs.  Do not share needles.  Ask your health care provider for help if you need support or information about quitting drugs. Alcohol use  Do not drink alcohol if: ? Your health care provider tells you not to drink. ? You are pregnant, may be pregnant, or are planning to become pregnant.  If you drink alcohol: ? Limit how much you use to 0-1 drink a day. ? Limit intake if you are breastfeeding.  Be aware of how much alcohol is in your drink. In the [place] one drink equals one 12 oz bottle of beer (355 mL), one 5 oz glass of wine (148 mL), or one 1 oz glass of hard liquor (44 mL). General instructions  Schedule regular health, dental, and eye exams.  Stay current with your vaccines.  Tell your health care provider if: ? You often feel depressed. ? You have ever been abused or do not feel safe at home. Summary  Adopting a healthy lifestyle and getting preventive care are important in promoting health and wellness.  Follow your health care provider's instructions about healthy  diet, exercising, and getting tested or screened for diseases.  Follow your health care provider's instructions on monitoring your cholesterol and blood pressure. This information is not intended to replace advice given to you by your health care provider. Make sure you discuss any questions you have with your health care provider. Document Released: 04/09/2011 Document Revised: 09/17/2018 Document Reviewed: 09/17/2018 Elsevier Patient Education  2020 Elsevier Inc[employer]  

## 2019-05-16 LAB — CERVICOVAGINAL ANCILLARY ONLY
Chlamydia: NEGATIVE
Neisseria Gonorrhea: NEGATIVE
Trichomonas: NEGATIVE

## 2019-05-18 DIAGNOSIS — F332 Major depressive disorder, recurrent severe without psychotic features: Secondary | ICD-10-CM | POA: Diagnosis not present

## 2019-05-18 DIAGNOSIS — F411 Generalized anxiety disorder: Secondary | ICD-10-CM | POA: Diagnosis not present

## 2019-05-20 LAB — CYTOLOGY - PAP
Adequacy: ABSENT
Diagnosis: NEGATIVE
HPV 16/18/45 genotyping: NEGATIVE
HPV: DETECTED — AB

## 2019-05-20 NOTE — Progress Notes (Signed)
Please call pt to let her know PAP is normal/negative but HPV +. Guidelines recommend we repeat PAP with HPV testing in 1 year

## 2019-05-25 DIAGNOSIS — F332 Major depressive disorder, recurrent severe without psychotic features: Secondary | ICD-10-CM | POA: Diagnosis not present

## 2019-05-25 DIAGNOSIS — F411 Generalized anxiety disorder: Secondary | ICD-10-CM | POA: Diagnosis not present

## 2019-06-01 DIAGNOSIS — F332 Major depressive disorder, recurrent severe without psychotic features: Secondary | ICD-10-CM | POA: Diagnosis not present

## 2019-06-01 DIAGNOSIS — F411 Generalized anxiety disorder: Secondary | ICD-10-CM | POA: Diagnosis not present

## 2019-06-18 DIAGNOSIS — F411 Generalized anxiety disorder: Secondary | ICD-10-CM | POA: Diagnosis not present

## 2019-06-18 DIAGNOSIS — F332 Major depressive disorder, recurrent severe without psychotic features: Secondary | ICD-10-CM | POA: Diagnosis not present

## 2019-06-22 DIAGNOSIS — F332 Major depressive disorder, recurrent severe without psychotic features: Secondary | ICD-10-CM | POA: Diagnosis not present

## 2019-06-22 DIAGNOSIS — F411 Generalized anxiety disorder: Secondary | ICD-10-CM | POA: Diagnosis not present

## 2019-06-23 ENCOUNTER — Ambulatory Visit: Payer: Medicare Other | Admitting: Registered Nurse

## 2019-06-24 ENCOUNTER — Encounter: Payer: Medicare Other | Attending: Physical Medicine & Rehabilitation | Admitting: Registered Nurse

## 2019-06-24 ENCOUNTER — Encounter: Payer: Self-pay | Admitting: Registered Nurse

## 2019-06-24 ENCOUNTER — Other Ambulatory Visit: Payer: Self-pay

## 2019-06-24 VITALS — BP 110/75 | HR 83 | Temp 98.7°F | Ht 65.0 in | Wt 192.2 lb

## 2019-06-24 DIAGNOSIS — S069X2S Unspecified intracranial injury with loss of consciousness of 31 minutes to 59 minutes, sequela: Secondary | ICD-10-CM | POA: Diagnosis not present

## 2019-06-24 DIAGNOSIS — D649 Anemia, unspecified: Secondary | ICD-10-CM | POA: Insufficient documentation

## 2019-06-24 DIAGNOSIS — S069X0S Unspecified intracranial injury without loss of consciousness, sequela: Secondary | ICD-10-CM

## 2019-06-24 DIAGNOSIS — S069X0A Unspecified intracranial injury without loss of consciousness, initial encounter: Secondary | ICD-10-CM | POA: Diagnosis not present

## 2019-06-24 DIAGNOSIS — S12100A Unspecified displaced fracture of second cervical vertebra, initial encounter for closed fracture: Secondary | ICD-10-CM | POA: Insufficient documentation

## 2019-06-24 DIAGNOSIS — Z79899 Other long term (current) drug therapy: Secondary | ICD-10-CM | POA: Insufficient documentation

## 2019-06-24 DIAGNOSIS — R011 Cardiac murmur, unspecified: Secondary | ICD-10-CM | POA: Diagnosis not present

## 2019-06-24 DIAGNOSIS — S060X9S Concussion with loss of consciousness of unspecified duration, sequela: Secondary | ICD-10-CM | POA: Diagnosis not present

## 2019-06-24 DIAGNOSIS — S82872A Displaced pilon fracture of left tibia, initial encounter for closed fracture: Secondary | ICD-10-CM | POA: Insufficient documentation

## 2019-06-24 DIAGNOSIS — S82891S Other fracture of right lower leg, sequela: Secondary | ICD-10-CM

## 2019-06-24 DIAGNOSIS — G894 Chronic pain syndrome: Secondary | ICD-10-CM

## 2019-06-24 DIAGNOSIS — R42 Dizziness and giddiness: Secondary | ICD-10-CM | POA: Diagnosis not present

## 2019-06-24 DIAGNOSIS — Z9889 Other specified postprocedural states: Secondary | ICD-10-CM | POA: Insufficient documentation

## 2019-06-24 DIAGNOSIS — R252 Cramp and spasm: Secondary | ICD-10-CM | POA: Diagnosis not present

## 2019-06-24 DIAGNOSIS — R4189 Other symptoms and signs involving cognitive functions and awareness: Secondary | ICD-10-CM

## 2019-06-24 DIAGNOSIS — R410 Disorientation, unspecified: Secondary | ICD-10-CM | POA: Insufficient documentation

## 2019-06-24 DIAGNOSIS — Z7722 Contact with and (suspected) exposure to environmental tobacco smoke (acute) (chronic): Secondary | ICD-10-CM | POA: Diagnosis not present

## 2019-06-24 DIAGNOSIS — G8929 Other chronic pain: Secondary | ICD-10-CM | POA: Diagnosis not present

## 2019-06-24 DIAGNOSIS — S82892S Other fracture of left lower leg, sequela: Secondary | ICD-10-CM | POA: Diagnosis not present

## 2019-06-24 DIAGNOSIS — Z5181 Encounter for therapeutic drug level monitoring: Secondary | ICD-10-CM

## 2019-06-24 DIAGNOSIS — S82871A Displaced pilon fracture of right tibia, initial encounter for closed fracture: Secondary | ICD-10-CM | POA: Diagnosis not present

## 2019-06-24 DIAGNOSIS — R51 Headache: Secondary | ICD-10-CM | POA: Insufficient documentation

## 2019-06-24 DIAGNOSIS — Z79891 Long term (current) use of opiate analgesic: Secondary | ICD-10-CM

## 2019-06-24 DIAGNOSIS — F068 Other specified mental disorders due to known physiological condition: Secondary | ICD-10-CM

## 2019-06-24 DIAGNOSIS — F329 Major depressive disorder, single episode, unspecified: Secondary | ICD-10-CM | POA: Diagnosis not present

## 2019-06-24 DIAGNOSIS — F419 Anxiety disorder, unspecified: Secondary | ICD-10-CM | POA: Diagnosis not present

## 2019-06-24 MED ORDER — OXYCODONE HCL 10 MG PO TABS: 10.0000 mg | ORAL_TABLET | Freq: Three times a day (TID) | ORAL | 0 refills | Status: DC | PRN

## 2019-06-24 NOTE — Progress Notes (Signed)
Subjective:    Patient ID: Monique [name], female    DOB: Jan 03, 1986, 33 y.o.   MRN: [id number]  HPI: Monique [name] is a 33 y.o. female who returns for follow up appointment for chronic pain and medication refill. She states her pain is located in her bilateral feet. She rates her  Pain 5. Her current exercise regime is walking and performing stretching exercises.  Ms. Wease Morphine equivalent is 45.00  MME.  Last Oral Swab was Performed on 01/02/2019, it was consistent.   Pain Inventory Average Pain 8-9 Pain Right Now 5 My pain is intermittent, sharp, stabbing, tingling and aching  In the last 24 hours, has pain interfered with the following? General activity 9 Relation with others 9 Enjoyment of life 9 What TIME of day is your pain at its worst? evening and night  Sleep (in general) Fair  Pain is worse with: walking, bending, standing and some activites Pain improves with: rest, heat/ice, pacing activities and medication Relief from Meds: 9  Mobility walk without assistance ability to climb steps?  yes do you drive?  yes Do you have any goals in this area?  no  Function disabled: date disabled . Do you have any goals in this area?  yes  Neuro/Psych weakness spasms dizziness confusion  Prior Studies Any changes since last visit?  no  Physicians involved in your care Any changes since last visit?  no   Family History  Problem Relation Age of Onset  . Cancer Paternal Grandmother        Ovarian   Social History   Socioeconomic History  . Marital status: Single    Spouse name: Not on file  . Number of children: Not on file  . Years of education: Not on file  . Highest education level: Not on file  Occupational History  . Not on file  Social Needs  . Financial resource strain: Not on file  . Food insecurity    Worry: Not on file    Inability: Not on file  . Transportation needs    Medical: Not on file    Non-medical: Not on file   Tobacco Use  . Smoking status: Passive Smoke Exposure - Never Smoker  . Smokeless tobacco: Never Used  Substance and Sexual Activity  . Alcohol use: Yes    Comment: occasional  . Drug use: No  . Sexual activity: Yes  Lifestyle  . Physical activity    Days per week: Not on file    Minutes per session: Not on file  . Stress: Not on file  Relationships  . Social Herbalist on phone: Not on file    Gets together: Not on file    Attends religious service: Not on file    Active member of club or organization: Not on file    Attends meetings of clubs or organizations: Not on file    Relationship status: Not on file  Other Topics Concern  . Not on file  Social History Narrative  . Not on file   Past Surgical History:  Procedure Laterality Date  . APPENDECTOMY    . Ceasarean section  2007, 2010,2014  . EXTERNAL FIXATION LEG Bilateral 12/14/2012   Procedure: EXTERNAL FIXATION LEG;  Surgeon: [name], MD;  Location: [place];  Service: Orthopedics;  Laterality: Bilateral;  . EXTERNAL FIXATION REMOVAL Bilateral 12/23/2012   Procedure: REMOVAL EXTERNAL FIXATION LEG;  Surgeon: [name], MD;  Location: [place];  Service:  Orthopedics;  Laterality: Bilateral;  . I&D EXTREMITY Left 12/14/2012   Procedure: IRRIGATION AND DEBRIDEMENT EXTREMITY;  Surgeon: [name], MD;  Location: [place];  Service: Orthopedics;  Laterality: Left;  . ORIF ANKLE FRACTURE Left 12/14/2012   Procedure: OPEN REDUCTION INTERNAL FIXATION (ORIF) ANKLE FRACTURE;  Surgeon: [name], MD;  Location: [place];  Service: Orthopedics;  Laterality: Left;  . ORIF TIBIA FRACTURE Bilateral 12/23/2012   Procedure: OPEN REDUCTION INTERNAL FIXATION (ORIF) BILATERAL DISTAL TIBIA FRACTURE;  Surgeon: [name], MD;  Location: [place];  Service: Orthopedics;  Laterality: Bilateral;   Past Medical History:  Diagnosis Date  . Anemia   . Anxiety   . Depression   . Heart murmur    There were no vitals taken for  this visit.  Opioid Risk Score:   Fall Risk Score:  `1  Depression screen PHQ 2/9  Depression screen [facility] 2/9 04/27/2019 11/05/2018 11/11/2017 07/15/2017 12/24/2014  Decreased Interest 0 1 0 0 2  Down, Depressed, Hopeless 0 1 0 0 2  PHQ - 2 Score 0 2 0 0 4  Altered sleeping - - - - 3  Tired, decreased energy - - - - 3  Change in appetite - - - - 2  Feeling bad or failure about yourself  - - - - 2  Trouble concentrating - - - - 3  Moving slowly or fidgety/restless - - - - 3  Suicidal thoughts - - - - 0  PHQ-9 Score - - - - 20    Review of Systems  Constitutional: Negative.   HENT: Negative.   Eyes: Negative.   Respiratory: Negative.   Cardiovascular: Negative.   Gastrointestinal: Negative.   Endocrine: Negative.   Genitourinary: Negative.   Musculoskeletal: Negative.   Skin: Negative.   Allergic/Immunologic: Negative.   Neurological: Positive for dizziness and weakness.  Hematological: Negative.   Psychiatric/Behavioral: Positive for confusion.  All other systems reviewed and are negative.      Objective:   Physical Exam Vitals signs and nursing note reviewed.  Constitutional:      Appearance: Normal appearance.  Neck:     Musculoskeletal: Normal range of motion and neck supple.  Cardiovascular:     Rate and Rhythm: Normal rate and regular rhythm.     Pulses: Normal pulses.     Heart sounds: Normal heart sounds.  Pulmonary:     Effort: Pulmonary effort is normal.     Breath sounds: Normal breath sounds.  Musculoskeletal:     Comments: Normal Muscle Bulk and Muscle Testing Reveals:  Upper Extremities: Full ROM and Muscle Strength 5/5  Lower Extremities: Full ROM and Muscle Strength 5/5 Arises from Table with ease Narrow Based Gait   Skin:    General: Skin is warm and dry.  Neurological:     Mental Status: She is alert and oriented to person, place, and time.  Psychiatric:        Mood and Affect: Mood normal.        Behavior: Behavior normal.            Assessment & Plan:  1.TBI:Continue to Monitor.06/24/2019 Continue with current treatment:using the various devices to help with your Organization such as notes, calendars and using her cell phone to set reminders. 2. Chronic Pain Syndrome: Refilled:Oxycodone 10 mg one tablet TID as needed as needed #75. Second scripte-scribefor the following month.06/24/2019 3. Bilateral Pilon Fractures: Continueto Monitor. 06/24/2019 4. Tibial Nerve Injuries: Continue to monitor.06/24/2019 5. Migraine:No complaints  today.Continue :Naproxen:Continue to monitor. 06/24/2019  10minutes of face to face patient care time was spent during this visit. All questions were encouraged and answered.  F/U Visit in 2 months

## 2019-06-25 ENCOUNTER — Encounter: Payer: Medicare Other | Admitting: Registered Nurse

## 2019-06-29 DIAGNOSIS — F411 Generalized anxiety disorder: Secondary | ICD-10-CM | POA: Diagnosis not present

## 2019-06-29 DIAGNOSIS — F332 Major depressive disorder, recurrent severe without psychotic features: Secondary | ICD-10-CM | POA: Diagnosis not present

## 2019-07-06 DIAGNOSIS — F411 Generalized anxiety disorder: Secondary | ICD-10-CM | POA: Diagnosis not present

## 2019-07-06 DIAGNOSIS — F332 Major depressive disorder, recurrent severe without psychotic features: Secondary | ICD-10-CM | POA: Diagnosis not present

## 2019-07-13 DIAGNOSIS — F411 Generalized anxiety disorder: Secondary | ICD-10-CM | POA: Diagnosis not present

## 2019-07-13 DIAGNOSIS — F332 Major depressive disorder, recurrent severe without psychotic features: Secondary | ICD-10-CM | POA: Diagnosis not present

## 2019-07-20 DIAGNOSIS — F332 Major depressive disorder, recurrent severe without psychotic features: Secondary | ICD-10-CM | POA: Diagnosis not present

## 2019-07-20 DIAGNOSIS — F411 Generalized anxiety disorder: Secondary | ICD-10-CM | POA: Diagnosis not present

## 2019-07-27 DIAGNOSIS — F411 Generalized anxiety disorder: Secondary | ICD-10-CM | POA: Diagnosis not present

## 2019-07-27 DIAGNOSIS — F332 Major depressive disorder, recurrent severe without psychotic features: Secondary | ICD-10-CM | POA: Diagnosis not present

## 2019-08-03 DIAGNOSIS — F332 Major depressive disorder, recurrent severe without psychotic features: Secondary | ICD-10-CM | POA: Diagnosis not present

## 2019-08-03 DIAGNOSIS — F411 Generalized anxiety disorder: Secondary | ICD-10-CM | POA: Diagnosis not present

## 2019-08-07 ENCOUNTER — Other Ambulatory Visit: Payer: Self-pay | Admitting: Family Medicine

## 2019-08-07 DIAGNOSIS — E559 Vitamin D deficiency, unspecified: Secondary | ICD-10-CM

## 2019-08-17 DIAGNOSIS — F332 Major depressive disorder, recurrent severe without psychotic features: Secondary | ICD-10-CM | POA: Diagnosis not present

## 2019-08-17 DIAGNOSIS — F411 Generalized anxiety disorder: Secondary | ICD-10-CM | POA: Diagnosis not present

## 2019-08-19 ENCOUNTER — Encounter: Payer: Self-pay | Admitting: Registered Nurse

## 2019-08-19 ENCOUNTER — Encounter: Payer: Medicare Other | Attending: Physical Medicine & Rehabilitation | Admitting: Registered Nurse

## 2019-08-19 ENCOUNTER — Other Ambulatory Visit: Payer: Self-pay

## 2019-08-19 VITALS — BP 109/73 | HR 85 | Temp 97.7°F | Ht 65.0 in | Wt 195.8 lb

## 2019-08-19 DIAGNOSIS — G894 Chronic pain syndrome: Secondary | ICD-10-CM | POA: Diagnosis not present

## 2019-08-19 DIAGNOSIS — D649 Anemia, unspecified: Secondary | ICD-10-CM | POA: Insufficient documentation

## 2019-08-19 DIAGNOSIS — Z79899 Other long term (current) drug therapy: Secondary | ICD-10-CM | POA: Insufficient documentation

## 2019-08-19 DIAGNOSIS — R531 Weakness: Secondary | ICD-10-CM | POA: Diagnosis not present

## 2019-08-19 DIAGNOSIS — R202 Paresthesia of skin: Secondary | ICD-10-CM | POA: Diagnosis not present

## 2019-08-19 DIAGNOSIS — S069X2S Unspecified intracranial injury with loss of consciousness of 31 minutes to 59 minutes, sequela: Secondary | ICD-10-CM

## 2019-08-19 DIAGNOSIS — Z760 Encounter for issue of repeat prescription: Secondary | ICD-10-CM | POA: Insufficient documentation

## 2019-08-19 DIAGNOSIS — S82892S Other fracture of left lower leg, sequela: Secondary | ICD-10-CM | POA: Diagnosis not present

## 2019-08-19 DIAGNOSIS — R42 Dizziness and giddiness: Secondary | ICD-10-CM | POA: Insufficient documentation

## 2019-08-19 DIAGNOSIS — M79671 Pain in right foot: Secondary | ICD-10-CM | POA: Diagnosis not present

## 2019-08-19 DIAGNOSIS — M79605 Pain in left leg: Secondary | ICD-10-CM | POA: Diagnosis not present

## 2019-08-19 DIAGNOSIS — Z5181 Encounter for therapeutic drug level monitoring: Secondary | ICD-10-CM

## 2019-08-19 DIAGNOSIS — X58XXXS Exposure to other specified factors, sequela: Secondary | ICD-10-CM | POA: Diagnosis not present

## 2019-08-19 DIAGNOSIS — R011 Cardiac murmur, unspecified: Secondary | ICD-10-CM | POA: Insufficient documentation

## 2019-08-19 DIAGNOSIS — Z7722 Contact with and (suspected) exposure to environmental tobacco smoke (acute) (chronic): Secondary | ICD-10-CM | POA: Diagnosis not present

## 2019-08-19 DIAGNOSIS — Z79891 Long term (current) use of opiate analgesic: Secondary | ICD-10-CM | POA: Diagnosis not present

## 2019-08-19 DIAGNOSIS — S82891S Other fracture of right lower leg, sequela: Secondary | ICD-10-CM | POA: Diagnosis not present

## 2019-08-19 DIAGNOSIS — G43909 Migraine, unspecified, not intractable, without status migrainosus: Secondary | ICD-10-CM | POA: Diagnosis not present

## 2019-08-19 DIAGNOSIS — S060X9S Concussion with loss of consciousness of unspecified duration, sequela: Secondary | ICD-10-CM | POA: Diagnosis not present

## 2019-08-19 MED ORDER — OXYCODONE HCL 10 MG PO TABS: 10.0000 mg | ORAL_TABLET | Freq: Three times a day (TID) | ORAL | 0 refills | Status: DC | PRN

## 2019-08-19 NOTE — Progress Notes (Signed)
Subjective:    Patient ID: Monique [name], female    DOB: Feb 03, 1986, 33 y.o.   MRN: [id number]  HPI: Monique [name] is a 33 y.o. female who returns for follow up appointment for chronic pain and medication refill. She states her  pain is located in her right foot. Also reports at times she has left lower extremity pain, no pain at this moment. She rates her pain 3. Her current exercise regime is walking and performing stretching exercises.  [name] Morphine equivalent is  45.00 MME. Oral Swab Performed today.    Pain Inventory Average Pain 8 Pain Right Now 3 My pain is constant, sharp, tingling and aching  In the last 24 hours, has pain interfered with the following? General activity 7 Relation with others 7 Enjoyment of life 7 What TIME of day is your pain at its worst? daytime, evening and night Sleep (in general) Fair  Pain is worse with: walking, bending, standing and some activites Pain improves with: rest, heat/ice, pacing activities and medication Relief from Meds: 8  Mobility walk without assistance ability to climb steps?  yes do you drive?  yes  Function disabled: date disabled .  Neuro/Psych weakness dizziness anxiety  Prior Studies Any changes since last visit?  no  Physicians involved in your care Any changes since last visit?  no   Family History  Problem Relation Age of Onset  . Cancer Paternal Grandmother        Ovarian   Social History   Socioeconomic History  . Marital status: Single    Spouse name: Not on file  . Number of children: Not on file  . Years of education: Not on file  . Highest education level: Not on file  Occupational History  . Not on file  Social Needs  . Financial resource strain: Not on file  . Food insecurity    Worry: Not on file    Inability: Not on file  . Transportation needs    Medical: Not on file    Non-medical: Not on file  Tobacco Use  . Smoking status: Passive Smoke Exposure - Never  Smoker  . Smokeless tobacco: Never Used  Substance and Sexual Activity  . Alcohol use: Yes    Comment: occasional  . Drug use: No  . Sexual activity: Yes  Lifestyle  . Physical activity    Days per week: Not on file    Minutes per session: Not on file  . Stress: Not on file  Relationships  . Social Herbalist on phone: Not on file    Gets together: Not on file    Attends religious service: Not on file    Active member of club or organization: Not on file    Attends meetings of clubs or organizations: Not on file    Relationship status: Not on file  Other Topics Concern  . Not on file  Social History Narrative  . Not on file   Past Surgical History:  Procedure Laterality Date  . APPENDECTOMY    . Ceasarean section  2007, 2010,2014  . EXTERNAL FIXATION LEG Bilateral 12/14/2012   Procedure: EXTERNAL FIXATION LEG;  Surgeon: [name], MD;  Location: [place];  Service: Orthopedics;  Laterality: Bilateral;  . EXTERNAL FIXATION REMOVAL Bilateral 12/23/2012   Procedure: REMOVAL EXTERNAL FIXATION LEG;  Surgeon: [name], MD;  Location: [place];  Service: Orthopedics;  Laterality: Bilateral;  . I&D EXTREMITY Left 12/14/2012   Procedure:  IRRIGATION AND DEBRIDEMENT EXTREMITY;  Surgeon: [name], MD;  Location: [place];  Service: Orthopedics;  Laterality: Left;  . ORIF ANKLE FRACTURE Left 12/14/2012   Procedure: OPEN REDUCTION INTERNAL FIXATION (ORIF) ANKLE FRACTURE;  Surgeon: [name], MD;  Location: [place];  Service: Orthopedics;  Laterality: Left;  . ORIF TIBIA FRACTURE Bilateral 12/23/2012   Procedure: OPEN REDUCTION INTERNAL FIXATION (ORIF) BILATERAL DISTAL TIBIA FRACTURE;  Surgeon: [name], MD;  Location: [place];  Service: Orthopedics;  Laterality: Bilateral;   Past Medical History:  Diagnosis Date  . Anemia   . Anxiety   . Depression   . Heart murmur    BP 109/73   Pulse 85   Temp 97.7 F (36.5 C)   Ht 5\' 5"  (1.651 m)   Wt 195 lb 12.8 oz (88.8  kg)   SpO2 96%   BMI 32.58 kg/m   Opioid Risk Score:   Fall Risk Score:  `1  Depression screen PHQ 2/9  Depression screen [facility] 2/9 04/27/2019 11/05/2018 11/11/2017 07/15/2017 12/24/2014  Decreased Interest 0 1 0 0 2  Down, Depressed, Hopeless 0 1 0 0 2  PHQ - 2 Score 0 2 0 0 4  Altered sleeping - - - - 3  Tired, decreased energy - - - - 3  Change in appetite - - - - 2  Feeling bad or failure about yourself  - - - - 2  Trouble concentrating - - - - 3  Moving slowly or fidgety/restless - - - - 3  Suicidal thoughts - - - - 0  PHQ-9 Score - - - - 20   Review of Systems  Neurological: Positive for dizziness and weakness.  All other systems reviewed and are negative.      Objective:   Physical Exam Vitals signs and nursing note reviewed.  Constitutional:      Appearance: Normal appearance.  Neck:     Musculoskeletal: Normal range of motion and neck supple.  Cardiovascular:     Rate and Rhythm: Normal rate and regular rhythm.     Pulses: Normal pulses.     Heart sounds: Normal heart sounds.  Pulmonary:     Effort: Pulmonary effort is normal.     Breath sounds: Normal breath sounds.  Musculoskeletal:     Comments: Normal Muscle Bulk and Muscle Testing Reveals:  Upper Extremities: Full ROM and Muscle Strength 5/5 Lower Extremities: Full ROM and Muscle Strength 5/5 Arises from chair with ease Narrow Based  Gait   Skin:    General: Skin is warm and dry.  Neurological:     Mental Status: She is alert and oriented to person, place, and time.  Psychiatric:        Mood and Affect: Mood normal.        Behavior: Behavior normal.           Assessment & Plan:  1.TBI:Continue to Monitor.08/19/2019 Continue with current treatment:using the various devices to help with your Organization such as notes, calendars and using her cell phone to set reminders. 2. Chronic Pain Syndrome: Refilled:Oxycodone 10 mg one tablet TID as needed as needed #75. Second scripte-scribefor the  following month.08/19/2019 3. Bilateral Pilon Fractures: Continueto Monitor. 08/19/2019 4. Tibial Nerve Injuries: Continue to monitor.08/19/2019 5. Migraine:No complaints today.Continue :Naproxen:Continue to monitor. 08/19/2019  63minutes of face to face patient care time was spent during this visit. All questions were encouraged and answered.  F/U Visit in 2 months

## 2019-08-22 LAB — DRUG TOX MONITOR 1 W/CONF, ORAL FLD
Amphetamines: NEGATIVE ng/mL (ref <10)
Barbiturates: NEGATIVE ng/mL (ref <10)
Benzodiazepines: NEGATIVE ng/mL (ref <0.50)
Buprenorphine: NEGATIVE ng/mL (ref <0.10)
Cocaine: NEGATIVE ng/mL (ref <5.0)
Codeine: NEGATIVE ng/mL (ref <2.5)
Dihydrocodeine: NEGATIVE ng/mL (ref <2.5)
Fentanyl: NEGATIVE ng/mL (ref <0.10)
Heroin Metabolite: NEGATIVE ng/mL (ref <1.0)
Hydrocodone: NEGATIVE ng/mL (ref <2.5)
Hydromorphone: NEGATIVE ng/mL (ref <2.5)
MARIJUANA: NEGATIVE ng/mL (ref <2.5)
MDMA: NEGATIVE ng/mL (ref <10)
Meprobamate: NEGATIVE ng/mL (ref <2.5)
Methadone: NEGATIVE ng/mL (ref <5.0)
Morphine: NEGATIVE ng/mL (ref <2.5)
Nicotine Metabolite: NEGATIVE ng/mL (ref <5.0)
Norhydrocodone: NEGATIVE ng/mL (ref <2.5)
Noroxycodone: 6.3 ng/mL — ABNORMAL HIGH (ref <2.5)
Opiates: POSITIVE ng/mL — AB (ref <2.5)
Oxycodone: 106.5 ng/mL — ABNORMAL HIGH (ref <2.5)
Oxymorphone: NEGATIVE ng/mL (ref <2.5)
Phencyclidine: NEGATIVE ng/mL (ref <10)
Tapentadol: NEGATIVE ng/mL (ref <5.0)
Tramadol: NEGATIVE ng/mL (ref <5.0)
Zolpidem: NEGATIVE ng/mL (ref <5.0)

## 2019-08-22 LAB — DRUG TOX ALC METAB W/CON, ORAL FLD: Alcohol Metabolite: NEGATIVE ng/mL

## 2019-08-24 ENCOUNTER — Telehealth: Payer: Self-pay | Admitting: *Deleted

## 2019-08-24 DIAGNOSIS — F411 Generalized anxiety disorder: Secondary | ICD-10-CM | POA: Diagnosis not present

## 2019-08-24 DIAGNOSIS — F332 Major depressive disorder, recurrent severe without psychotic features: Secondary | ICD-10-CM | POA: Diagnosis not present

## 2019-08-24 NOTE — Telephone Encounter (Signed)
Oral swab drug screen was consistent for prescribed medications.  ?

## 2019-08-31 DIAGNOSIS — F411 Generalized anxiety disorder: Secondary | ICD-10-CM | POA: Diagnosis not present

## 2019-08-31 DIAGNOSIS — F332 Major depressive disorder, recurrent severe without psychotic features: Secondary | ICD-10-CM | POA: Diagnosis not present

## 2019-09-07 DIAGNOSIS — F332 Major depressive disorder, recurrent severe without psychotic features: Secondary | ICD-10-CM | POA: Diagnosis not present

## 2019-09-07 DIAGNOSIS — F411 Generalized anxiety disorder: Secondary | ICD-10-CM | POA: Diagnosis not present

## 2019-09-21 DIAGNOSIS — F332 Major depressive disorder, recurrent severe without psychotic features: Secondary | ICD-10-CM | POA: Diagnosis not present

## 2019-09-21 DIAGNOSIS — F411 Generalized anxiety disorder: Secondary | ICD-10-CM | POA: Diagnosis not present

## 2019-09-28 DIAGNOSIS — F411 Generalized anxiety disorder: Secondary | ICD-10-CM | POA: Diagnosis not present

## 2019-09-28 DIAGNOSIS — F332 Major depressive disorder, recurrent severe without psychotic features: Secondary | ICD-10-CM | POA: Diagnosis not present

## 2019-10-05 DIAGNOSIS — F332 Major depressive disorder, recurrent severe without psychotic features: Secondary | ICD-10-CM | POA: Diagnosis not present

## 2019-10-05 DIAGNOSIS — F411 Generalized anxiety disorder: Secondary | ICD-10-CM | POA: Diagnosis not present

## 2019-10-12 DIAGNOSIS — F332 Major depressive disorder, recurrent severe without psychotic features: Secondary | ICD-10-CM | POA: Diagnosis not present

## 2019-10-12 DIAGNOSIS — F411 Generalized anxiety disorder: Secondary | ICD-10-CM | POA: Diagnosis not present

## 2019-10-19 DIAGNOSIS — F332 Major depressive disorder, recurrent severe without psychotic features: Secondary | ICD-10-CM | POA: Diagnosis not present

## 2019-10-19 DIAGNOSIS — F411 Generalized anxiety disorder: Secondary | ICD-10-CM | POA: Diagnosis not present

## 2019-10-20 ENCOUNTER — Encounter: Payer: Medicare Other | Attending: Physical Medicine & Rehabilitation | Admitting: Registered Nurse

## 2019-10-20 ENCOUNTER — Encounter (INDEPENDENT_AMBULATORY_CARE_PROVIDER_SITE_OTHER): Payer: Self-pay

## 2019-10-20 ENCOUNTER — Encounter: Payer: Self-pay | Admitting: Registered Nurse

## 2019-10-20 ENCOUNTER — Other Ambulatory Visit: Payer: Self-pay

## 2019-10-20 VITALS — BP 123/57 | HR 85 | Temp 97.5°F | Ht 65.0 in | Wt 200.4 lb

## 2019-10-20 DIAGNOSIS — S069X0S Unspecified intracranial injury without loss of consciousness, sequela: Secondary | ICD-10-CM | POA: Diagnosis not present

## 2019-10-20 DIAGNOSIS — R42 Dizziness and giddiness: Secondary | ICD-10-CM | POA: Diagnosis not present

## 2019-10-20 DIAGNOSIS — D649 Anemia, unspecified: Secondary | ICD-10-CM | POA: Insufficient documentation

## 2019-10-20 DIAGNOSIS — Z7722 Contact with and (suspected) exposure to environmental tobacco smoke (acute) (chronic): Secondary | ICD-10-CM | POA: Insufficient documentation

## 2019-10-20 DIAGNOSIS — Z760 Encounter for issue of repeat prescription: Secondary | ICD-10-CM | POA: Diagnosis not present

## 2019-10-20 DIAGNOSIS — M79671 Pain in right foot: Secondary | ICD-10-CM | POA: Diagnosis not present

## 2019-10-20 DIAGNOSIS — R202 Paresthesia of skin: Secondary | ICD-10-CM | POA: Insufficient documentation

## 2019-10-20 DIAGNOSIS — R531 Weakness: Secondary | ICD-10-CM | POA: Diagnosis not present

## 2019-10-20 DIAGNOSIS — F068 Other specified mental disorders due to known physiological condition: Secondary | ICD-10-CM | POA: Diagnosis not present

## 2019-10-20 DIAGNOSIS — R011 Cardiac murmur, unspecified: Secondary | ICD-10-CM | POA: Insufficient documentation

## 2019-10-20 DIAGNOSIS — S069X2S Unspecified intracranial injury with loss of consciousness of 31 minutes to 59 minutes, sequela: Secondary | ICD-10-CM | POA: Diagnosis not present

## 2019-10-20 DIAGNOSIS — G43909 Migraine, unspecified, not intractable, without status migrainosus: Secondary | ICD-10-CM | POA: Diagnosis not present

## 2019-10-20 DIAGNOSIS — Z5181 Encounter for therapeutic drug level monitoring: Secondary | ICD-10-CM

## 2019-10-20 DIAGNOSIS — S82892S Other fracture of left lower leg, sequela: Secondary | ICD-10-CM | POA: Diagnosis not present

## 2019-10-20 DIAGNOSIS — S82891S Other fracture of right lower leg, sequela: Secondary | ICD-10-CM

## 2019-10-20 DIAGNOSIS — M79605 Pain in left leg: Secondary | ICD-10-CM | POA: Insufficient documentation

## 2019-10-20 DIAGNOSIS — G894 Chronic pain syndrome: Secondary | ICD-10-CM

## 2019-10-20 DIAGNOSIS — R4189 Other symptoms and signs involving cognitive functions and awareness: Secondary | ICD-10-CM

## 2019-10-20 DIAGNOSIS — Z79891 Long term (current) use of opiate analgesic: Secondary | ICD-10-CM | POA: Diagnosis not present

## 2019-10-20 DIAGNOSIS — Z79899 Other long term (current) drug therapy: Secondary | ICD-10-CM | POA: Diagnosis not present

## 2019-10-20 DIAGNOSIS — S060X9S Concussion with loss of consciousness of unspecified duration, sequela: Secondary | ICD-10-CM | POA: Diagnosis not present

## 2019-10-20 MED ORDER — OXYCODONE HCL 10 MG PO TABS: 10.0000 mg | ORAL_TABLET | Freq: Three times a day (TID) | ORAL | 0 refills | Status: DC | PRN

## 2019-10-20 NOTE — Progress Notes (Signed)
Subjective:    Patient ID: Monique [name], female    DOB: 07/19/1986, 34 y.o.   MRN: [id number]  HPI: Monique [name] is a 34 y.o. female who returns for follow up appointment for chronic pain and medication refill. She states her pain is located in her right foot. She rates her pain 5. Her  current exercise regime is walking.  Monique [name] Morphine equivalent is 45.00  MME.  Last Oral Swab was performed on 08/19/2019, it was consistent.   Pain Inventory Average Pain 9 Pain Right Now 5 My pain is constant, sharp, stabbing and aching  In the last 24 hours, has pain interfered with the following? General activity 7 Relation with others 7 Enjoyment of life 7 What TIME of day is your pain at its worst? evening Sleep (in general) Fair  Pain is worse with: walking, bending, standing and some activites Pain improves with: rest, heat/ice, therapy/exercise, pacing activities and medication Relief from Meds: 5  Mobility walk without assistance ability to climb steps?  yes do you drive?  yes  Function disabled: date disabled . Do you have any goals in this area?  yes  Neuro/Psych bladder control problems bowel control problems spasms dizziness confusion anxiety  Prior Studies Any changes since last visit?  no  Physicians involved in your care Any changes since last visit?  no   Family History  Problem Relation Age of Onset  . Cancer Paternal Grandmother        Ovarian   Social History   Socioeconomic History  . Marital status: Single    Spouse name: Not on file  . Number of children: Not on file  . Years of education: Not on file  . Highest education level: Not on file  Occupational History  . Not on file  Tobacco Use  . Smoking status: Passive Smoke Exposure - Never Smoker  . Smokeless tobacco: Never Used  Substance and Sexual Activity  . Alcohol use: Yes    Comment: occasional  . Drug use: No  . Sexual activity: Yes  Other Topics Concern  .  Not on file  Social History Narrative  . Not on file   Social Determinants of Health   Financial Resource Strain:   . Difficulty of Paying Living Expenses: Not on file  Food Insecurity:   . Worried About Charity fundraiser in the Last Year: Not on file  . Ran Out of Food in the Last Year: Not on file  Transportation Needs:   . Lack of Transportation (Medical): Not on file  . Lack of Transportation (Non-Medical): Not on file  Physical Activity:   . Days of Exercise per Week: Not on file  . Minutes of Exercise per Session: Not on file  Stress:   . Feeling of Stress : Not on file  Social Connections:   . Frequency of Communication with Friends and Family: Not on file  . Frequency of Social Gatherings with Friends and Family: Not on file  . Attends Religious Services: Not on file  . Active Member of Clubs or Organizations: Not on file  . Attends Archivist Meetings: Not on file  . Marital Status: Not on file   Past Surgical History:  Procedure Laterality Date  . APPENDECTOMY    . Ceasarean section  2007, 2010,2014  . EXTERNAL FIXATION LEG Bilateral 12/14/2012   Procedure: EXTERNAL FIXATION LEG;  Surgeon: [name], MD;  Location: [place];  Service: Orthopedics;  Laterality: Bilateral;  .  EXTERNAL FIXATION REMOVAL Bilateral 12/23/2012   Procedure: REMOVAL EXTERNAL FIXATION LEG;  Surgeon: [name], MD;  Location: [place];  Service: Orthopedics;  Laterality: Bilateral;  . I & D EXTREMITY Left 12/14/2012   Procedure: IRRIGATION AND DEBRIDEMENT EXTREMITY;  Surgeon: [name], MD;  Location: [place];  Service: Orthopedics;  Laterality: Left;  . ORIF ANKLE FRACTURE Left 12/14/2012   Procedure: OPEN REDUCTION INTERNAL FIXATION (ORIF) ANKLE FRACTURE;  Surgeon: [name], MD;  Location: [place];  Service: Orthopedics;  Laterality: Left;  . ORIF TIBIA FRACTURE Bilateral 12/23/2012   Procedure: OPEN REDUCTION INTERNAL FIXATION (ORIF) BILATERAL DISTAL TIBIA FRACTURE;  Surgeon:  [name], MD;  Location: [place];  Service: Orthopedics;  Laterality: Bilateral;   Past Medical History:  Diagnosis Date  . Anemia   . Anxiety   . Depression   . Heart murmur    BP (!) 123/57   Pulse 85   Temp (!) 97.5 F (36.4 C)   Ht 5\' 5"  (1.651 m)   Wt 200 lb 6.4 oz (90.9 kg)   SpO2 99%   BMI 33.35 kg/m   Opioid Risk Score:   Fall Risk Score:  `1  Depression screen PHQ 2/9  Depression screen [facility] 2/9 04/27/2019 11/05/2018 11/11/2017 07/15/2017 12/24/2014  Decreased Interest 0 1 0 0 2  Down, Depressed, Hopeless 0 1 0 0 2  PHQ - 2 Score 0 2 0 0 4  Altered sleeping - - - - 3  Tired, decreased energy - - - - 3  Change in appetite - - - - 2  Feeling bad or failure about yourself  - - - - 2  Trouble concentrating - - - - 3  Moving slowly or fidgety/restless - - - - 3  Suicidal thoughts - - - - 0  PHQ-9 Score - - - - 20     Review of Systems  Constitutional: Negative.   HENT: Negative.   Eyes: Negative.   Respiratory: Negative.   Cardiovascular: Negative.   Gastrointestinal: Positive for constipation.  Endocrine: Negative.   Genitourinary: Positive for difficulty urinating.  Musculoskeletal: Positive for arthralgias and myalgias.  Skin: Negative.   Allergic/Immunologic: Negative.   Neurological: Positive for dizziness.  Hematological: Negative.   Psychiatric/Behavioral: The patient is nervous/anxious.   All other systems reviewed and are negative.      Objective:   Physical Exam Constitutional:      Appearance: Normal appearance.  Cardiovascular:     Rate and Rhythm: Normal rate and regular rhythm.     Pulses: Normal pulses.     Heart sounds: Normal heart sounds.  Musculoskeletal:     Cervical back: Normal range of motion and neck supple.     Comments: Normal Muscle Bulk and Muscle Testing Reveals:  Upper Extremities: Full ROM and Muscle Strength 5/5 Lower Extremities: Full ROM and Muscle Strength 5/5 Arises from chair with ease Narrow Based Gait     Neurological:     Mental Status: She is alert and oriented to person, place, and time.  Psychiatric:        Mood and Affect: Mood normal.        Behavior: Behavior normal.           Assessment & Plan:  1.TBI:Cognitive deficit: Continue to Monitor.10/20/2019. Continue with current treatment:using the various devices to help with your Organization such as notes, calendars and using her cell phone to set reminders. 2. Chronic Pain Syndrome: Refilled:Oxycodone 10 mg one  tablet TID as needed as needed #75. Second scripte-scribefor the following month.10/20/2019 3. Bilateral Pilon Fractures: Continueto Monitor. 10/20/2019 4. Tibial Nerve Injuries: Continue to monitor.10/20/2019 5. Migraine:No complaints today.Continue :Naproxen:Continue to monitor. 10/20/2019  49minutes of face to face patient care time was spent during this visit. All questions were encouraged and answered.  F/U Visit in 2 months

## 2019-10-26 DIAGNOSIS — F332 Major depressive disorder, recurrent severe without psychotic features: Secondary | ICD-10-CM | POA: Diagnosis not present

## 2019-10-26 DIAGNOSIS — F411 Generalized anxiety disorder: Secondary | ICD-10-CM | POA: Diagnosis not present

## 2019-11-02 DIAGNOSIS — F411 Generalized anxiety disorder: Secondary | ICD-10-CM | POA: Diagnosis not present

## 2019-11-02 DIAGNOSIS — F332 Major depressive disorder, recurrent severe without psychotic features: Secondary | ICD-10-CM | POA: Diagnosis not present

## 2019-11-09 DIAGNOSIS — F332 Major depressive disorder, recurrent severe without psychotic features: Secondary | ICD-10-CM | POA: Diagnosis not present

## 2019-11-09 DIAGNOSIS — F411 Generalized anxiety disorder: Secondary | ICD-10-CM | POA: Diagnosis not present

## 2019-11-16 DIAGNOSIS — F332 Major depressive disorder, recurrent severe without psychotic features: Secondary | ICD-10-CM | POA: Diagnosis not present

## 2019-11-16 DIAGNOSIS — F411 Generalized anxiety disorder: Secondary | ICD-10-CM | POA: Diagnosis not present

## 2019-11-23 DIAGNOSIS — F332 Major depressive disorder, recurrent severe without psychotic features: Secondary | ICD-10-CM | POA: Diagnosis not present

## 2019-11-23 DIAGNOSIS — F411 Generalized anxiety disorder: Secondary | ICD-10-CM | POA: Diagnosis not present

## 2019-11-30 ENCOUNTER — Other Ambulatory Visit: Payer: Self-pay | Admitting: Family Medicine

## 2019-11-30 DIAGNOSIS — F411 Generalized anxiety disorder: Secondary | ICD-10-CM | POA: Diagnosis not present

## 2019-11-30 DIAGNOSIS — F332 Major depressive disorder, recurrent severe without psychotic features: Secondary | ICD-10-CM | POA: Diagnosis not present

## 2019-11-30 DIAGNOSIS — E559 Vitamin D deficiency, unspecified: Secondary | ICD-10-CM

## 2019-12-07 DIAGNOSIS — F332 Major depressive disorder, recurrent severe without psychotic features: Secondary | ICD-10-CM | POA: Diagnosis not present

## 2019-12-07 DIAGNOSIS — F411 Generalized anxiety disorder: Secondary | ICD-10-CM | POA: Diagnosis not present

## 2019-12-14 DIAGNOSIS — F332 Major depressive disorder, recurrent severe without psychotic features: Secondary | ICD-10-CM | POA: Diagnosis not present

## 2019-12-14 DIAGNOSIS — F411 Generalized anxiety disorder: Secondary | ICD-10-CM | POA: Diagnosis not present

## 2019-12-21 DIAGNOSIS — F411 Generalized anxiety disorder: Secondary | ICD-10-CM | POA: Diagnosis not present

## 2019-12-21 DIAGNOSIS — F332 Major depressive disorder, recurrent severe without psychotic features: Secondary | ICD-10-CM | POA: Diagnosis not present

## 2019-12-28 DIAGNOSIS — F332 Major depressive disorder, recurrent severe without psychotic features: Secondary | ICD-10-CM | POA: Diagnosis not present

## 2019-12-28 DIAGNOSIS — F411 Generalized anxiety disorder: Secondary | ICD-10-CM | POA: Diagnosis not present

## 2019-12-29 ENCOUNTER — Other Ambulatory Visit: Payer: Self-pay

## 2019-12-29 ENCOUNTER — Encounter: Payer: Medicare Other | Attending: Physical Medicine & Rehabilitation | Admitting: Registered Nurse

## 2019-12-29 ENCOUNTER — Encounter: Payer: Self-pay | Admitting: Registered Nurse

## 2019-12-29 VITALS — BP 109/76 | HR 82 | Temp 97.8°F | Ht 65.0 in | Wt 202.0 lb

## 2019-12-29 DIAGNOSIS — M79671 Pain in right foot: Secondary | ICD-10-CM | POA: Diagnosis not present

## 2019-12-29 DIAGNOSIS — G894 Chronic pain syndrome: Secondary | ICD-10-CM | POA: Insufficient documentation

## 2019-12-29 DIAGNOSIS — F068 Other specified mental disorders due to known physiological condition: Secondary | ICD-10-CM | POA: Diagnosis not present

## 2019-12-29 DIAGNOSIS — Z760 Encounter for issue of repeat prescription: Secondary | ICD-10-CM | POA: Diagnosis not present

## 2019-12-29 DIAGNOSIS — R42 Dizziness and giddiness: Secondary | ICD-10-CM | POA: Insufficient documentation

## 2019-12-29 DIAGNOSIS — R011 Cardiac murmur, unspecified: Secondary | ICD-10-CM | POA: Diagnosis not present

## 2019-12-29 DIAGNOSIS — S82891S Other fracture of right lower leg, sequela: Secondary | ICD-10-CM | POA: Diagnosis not present

## 2019-12-29 DIAGNOSIS — S069X0S Unspecified intracranial injury without loss of consciousness, sequela: Secondary | ICD-10-CM | POA: Diagnosis not present

## 2019-12-29 DIAGNOSIS — D649 Anemia, unspecified: Secondary | ICD-10-CM | POA: Diagnosis not present

## 2019-12-29 DIAGNOSIS — M79605 Pain in left leg: Secondary | ICD-10-CM | POA: Insufficient documentation

## 2019-12-29 DIAGNOSIS — Z79891 Long term (current) use of opiate analgesic: Secondary | ICD-10-CM | POA: Diagnosis not present

## 2019-12-29 DIAGNOSIS — R202 Paresthesia of skin: Secondary | ICD-10-CM | POA: Diagnosis not present

## 2019-12-29 DIAGNOSIS — R531 Weakness: Secondary | ICD-10-CM | POA: Insufficient documentation

## 2019-12-29 DIAGNOSIS — S060X9S Concussion with loss of consciousness of unspecified duration, sequela: Secondary | ICD-10-CM | POA: Insufficient documentation

## 2019-12-29 DIAGNOSIS — S82892S Other fracture of left lower leg, sequela: Secondary | ICD-10-CM | POA: Diagnosis not present

## 2019-12-29 DIAGNOSIS — Z7722 Contact with and (suspected) exposure to environmental tobacco smoke (acute) (chronic): Secondary | ICD-10-CM | POA: Insufficient documentation

## 2019-12-29 DIAGNOSIS — Z5181 Encounter for therapeutic drug level monitoring: Secondary | ICD-10-CM | POA: Insufficient documentation

## 2019-12-29 DIAGNOSIS — S069X2S Unspecified intracranial injury with loss of consciousness of 31 minutes to 59 minutes, sequela: Secondary | ICD-10-CM | POA: Diagnosis not present

## 2019-12-29 DIAGNOSIS — Z79899 Other long term (current) drug therapy: Secondary | ICD-10-CM | POA: Insufficient documentation

## 2019-12-29 DIAGNOSIS — G43909 Migraine, unspecified, not intractable, without status migrainosus: Secondary | ICD-10-CM | POA: Diagnosis not present

## 2019-12-29 MED ORDER — OXYCODONE HCL 10 MG PO TABS: 10.0000 mg | ORAL_TABLET | Freq: Three times a day (TID) | ORAL | 0 refills | Status: DC | PRN

## 2019-12-29 NOTE — Progress Notes (Signed)
Subjective:    Patient ID: Monique [name], female    DOB: 26-Nov-1985, 34 y.o.   MRN: [id number]  HPI: Monique [name] is a 34 y.o. female who returns for follow up appointment for chronic pain and medication refill. She states her pain is located in her right shin and right foot pain. Also states she had bunion pain, she will obtain a podiatrist number given, she verbalizes understanding. She rates her pain 6. Her current exercise regime is walking and performing stretching exercises.  [name] Morphine equivalent is 45.00  MME.  Oral Swab Performed Today.   Pain Inventory Average Pain 9 Pain Right Now 6 My pain is constant, sharp, stabbing and aching  In the last 24 hours, has pain interfered with the following? General activity 7 Relation with others 7 Enjoyment of life 7 What TIME of day is your pain at its worst? evening Sleep (in general) Fair  Pain is worse with: walking, standing and some activites Pain improves with: rest, heat/ice, pacing activities and medication Relief from Meds: 9  Mobility walk without assistance ability to climb steps?  yes do you drive?  yes  Function disabled: date disabled .  Neuro/Psych weakness spasms dizziness confusion depression anxiety  Prior Studies Any changes since last visit?  no  Physicians involved in your care Any changes since last visit?  no   Family History  Problem Relation Age of Onset  . Cancer Paternal Grandmother        Ovarian   Social History   Socioeconomic History  . Marital status: Single    Spouse name: Not on file  . Number of children: Not on file  . Years of education: Not on file  . Highest education level: Not on file  Occupational History  . Not on file  Tobacco Use  . Smoking status: Passive Smoke Exposure - Never Smoker  . Smokeless tobacco: Never Used  Substance and Sexual Activity  . Alcohol use: Yes    Comment: occasional  . Drug use: No  . Sexual activity: Yes    Other Topics Concern  . Not on file  Social History Narrative  . Not on file   Social Determinants of Health   Financial Resource Strain:   . Difficulty of Paying Living Expenses:   Food Insecurity:   . Worried About Charity fundraiser in the Last Year:   . Arboriculturist in the Last Year:   Transportation Needs:   . Film/video editor (Medical):   Marland Kitchen Lack of Transportation (Non-Medical):   Physical Activity:   . Days of Exercise per Week:   . Minutes of Exercise per Session:   Stress:   . Feeling of Stress :   Social Connections:   . Frequency of Communication with Friends and Family:   . Frequency of Social Gatherings with Friends and Family:   . Attends Religious Services:   . Active Member of Clubs or Organizations:   . Attends Archivist Meetings:   Marland Kitchen Marital Status:    Past Surgical History:  Procedure Laterality Date  . APPENDECTOMY    . Ceasarean section  2007, 2010,2014  . EXTERNAL FIXATION LEG Bilateral 12/14/2012   Procedure: EXTERNAL FIXATION LEG;  Surgeon: [name], MD;  Location: [place];  Service: Orthopedics;  Laterality: Bilateral;  . EXTERNAL FIXATION REMOVAL Bilateral 12/23/2012   Procedure: REMOVAL EXTERNAL FIXATION LEG;  Surgeon: [name], MD;  Location: [place];  Service: Orthopedics;  Laterality: Bilateral;  . I & D EXTREMITY Left 12/14/2012   Procedure: IRRIGATION AND DEBRIDEMENT EXTREMITY;  Surgeon: [name], MD;  Location: [place];  Service: Orthopedics;  Laterality: Left;  . ORIF ANKLE FRACTURE Left 12/14/2012   Procedure: OPEN REDUCTION INTERNAL FIXATION (ORIF) ANKLE FRACTURE;  Surgeon: [name], MD;  Location: [place];  Service: Orthopedics;  Laterality: Left;  . ORIF TIBIA FRACTURE Bilateral 12/23/2012   Procedure: OPEN REDUCTION INTERNAL FIXATION (ORIF) BILATERAL DISTAL TIBIA FRACTURE;  Surgeon: [name], MD;  Location: [place];  Service: Orthopedics;  Laterality: Bilateral;   Past Medical History:  Diagnosis  Date  . Anemia   . Anxiety   . Depression   . Heart murmur    There were no vitals taken for this visit.  Opioid Risk Score:   Fall Risk Score:  `1  Depression screen PHQ 2/9  Depression screen [facility] 2/9 04/27/2019 11/05/2018 11/11/2017 07/15/2017 12/24/2014  Decreased Interest 0 1 0 0 2  Down, Depressed, Hopeless 0 1 0 0 2  PHQ - 2 Score 0 2 0 0 4  Altered sleeping - - - - 3  Tired, decreased energy - - - - 3  Change in appetite - - - - 2  Feeling bad or failure about yourself  - - - - 2  Trouble concentrating - - - - 3  Moving slowly or fidgety/restless - - - - 3  Suicidal thoughts - - - - 0  PHQ-9 Score - - - - 20     Review of Systems  Constitutional: Negative.   HENT: Negative.   Eyes: Negative.   Respiratory: Negative.   Cardiovascular: Negative.   Gastrointestinal: Negative.   Endocrine: Negative.   Genitourinary: Negative.   Musculoskeletal: Positive for arthralgias, back pain and myalgias.  Skin: Negative.   Allergic/Immunologic: Negative.   Neurological: Positive for dizziness and weakness.  Hematological: Negative.   Psychiatric/Behavioral: Positive for confusion and dysphoric mood. The patient is nervous/anxious.   All other systems reviewed and are negative.      Objective:   Physical Exam Vitals and nursing note reviewed.  Constitutional:      Appearance: Normal appearance.  Cardiovascular:     Rate and Rhythm: Normal rate and regular rhythm.     Pulses: Normal pulses.     Heart sounds: Normal heart sounds.  Pulmonary:     Effort: Pulmonary effort is normal.     Breath sounds: Normal breath sounds.  Musculoskeletal:     Cervical back: Normal range of motion and neck supple.     Comments: Normal Muscle Bulk and Muscle Testing Reveals:  Upper Extremities: Full ROM and Muscle Strength 5/5  Lumbar Paraspinal Tenderness: L-4-L-5 Lower Extremities: Full ROM and Muscle Strength 5/5 Arises from Chair with ease Narrow Based  Gait   Skin:    General:  Skin is warm and dry.  Neurological:     Mental Status: She is alert and oriented to person, place, and time.  Psychiatric:        Mood and Affect: Mood normal.        Behavior: Behavior normal.           Assessment & Plan:  1.TBI:Cognitive deficit: Continue to Monitor.12/29/2019. Continue with current treatment:using the various devices to help with your Organization such as notes, calendars and using her cell phone to set reminders. 2. Chronic Pain Syndrome: Refilled:Oxycodone 10 mg one tablet TID as needed as needed #75. Second scripte-scribefor the  following month.12/29/2019 3. Bilateral Pilon Fractures: Continueto Monitor. 12/29/2019 4. Tibial Nerve Injuries: Continue to monitor.12/29/2019 5. Migraine:No complaints today.Continue :Naproxen:Continue to monitor. 12/29/2019  23minutes of face to face patient care time was spent during this visit. All questions were encouraged and answered.  F/U Visit in 2 months

## 2019-12-29 NOTE — Patient Instructions (Signed)
[facility] ([place]) Address: [address], [place], [place] [address] Phone: [phone] 

## 2019-12-31 DIAGNOSIS — F332 Major depressive disorder, recurrent severe without psychotic features: Secondary | ICD-10-CM | POA: Diagnosis not present

## 2019-12-31 DIAGNOSIS — F411 Generalized anxiety disorder: Secondary | ICD-10-CM | POA: Diagnosis not present

## 2020-01-04 DIAGNOSIS — F332 Major depressive disorder, recurrent severe without psychotic features: Secondary | ICD-10-CM | POA: Diagnosis not present

## 2020-01-04 DIAGNOSIS — F411 Generalized anxiety disorder: Secondary | ICD-10-CM | POA: Diagnosis not present

## 2020-01-04 LAB — DRUG TOX MONITOR 1 W/CONF, ORAL FLD
Amphetamines: NEGATIVE ng/mL (ref <10)
Barbiturates: NEGATIVE ng/mL (ref <10)
Benzodiazepines: NEGATIVE ng/mL (ref <0.50)
Buprenorphine: NEGATIVE ng/mL (ref <0.10)
Cocaine: NEGATIVE ng/mL (ref <5.0)
Codeine: NEGATIVE ng/mL (ref <2.5)
Dihydrocodeine: NEGATIVE ng/mL (ref <2.5)
Fentanyl: NEGATIVE ng/mL (ref <0.10)
Heroin Metabolite: NEGATIVE ng/mL (ref <1.0)
Hydrocodone: NEGATIVE ng/mL (ref <2.5)
Hydromorphone: NEGATIVE ng/mL (ref <2.5)
MARIJUANA: NEGATIVE ng/mL (ref <2.5)
MDMA: NEGATIVE ng/mL (ref <10)
Meprobamate: NEGATIVE ng/mL (ref <2.5)
Methadone: NEGATIVE ng/mL (ref <5.0)
Morphine: NEGATIVE ng/mL (ref <2.5)
Nicotine Metabolite: NEGATIVE ng/mL (ref <5.0)
Norhydrocodone: NEGATIVE ng/mL (ref <2.5)
Noroxycodone: NEGATIVE ng/mL (ref <2.5)
Opiates: POSITIVE ng/mL — AB (ref <2.5)
Oxycodone: 11.8 ng/mL — ABNORMAL HIGH (ref <2.5)
Oxymorphone: NEGATIVE ng/mL (ref <2.5)
Phencyclidine: NEGATIVE ng/mL (ref <10)
Tapentadol: NEGATIVE ng/mL (ref <5.0)
Tramadol: NEGATIVE ng/mL (ref <5.0)
Zolpidem: NEGATIVE ng/mL (ref <5.0)

## 2020-01-04 LAB — DRUG TOX ALC METAB W/CON, ORAL FLD: Alcohol Metabolite: NEGATIVE ng/mL (ref <25)

## 2020-01-05 ENCOUNTER — Telehealth: Payer: Self-pay

## 2020-01-05 NOTE — Telephone Encounter (Signed)
UDS RESULTS CONSISTENT WITH MEDICATIONS ON FILE  

## 2020-01-06 DIAGNOSIS — F411 Generalized anxiety disorder: Secondary | ICD-10-CM | POA: Diagnosis not present

## 2020-01-06 DIAGNOSIS — F332 Major depressive disorder, recurrent severe without psychotic features: Secondary | ICD-10-CM | POA: Diagnosis not present

## 2020-01-11 DIAGNOSIS — F332 Major depressive disorder, recurrent severe without psychotic features: Secondary | ICD-10-CM | POA: Diagnosis not present

## 2020-01-11 DIAGNOSIS — F411 Generalized anxiety disorder: Secondary | ICD-10-CM | POA: Diagnosis not present

## 2020-01-18 DIAGNOSIS — F411 Generalized anxiety disorder: Secondary | ICD-10-CM | POA: Diagnosis not present

## 2020-01-18 DIAGNOSIS — F332 Major depressive disorder, recurrent severe without psychotic features: Secondary | ICD-10-CM | POA: Diagnosis not present

## 2020-01-25 DIAGNOSIS — F332 Major depressive disorder, recurrent severe without psychotic features: Secondary | ICD-10-CM | POA: Diagnosis not present

## 2020-01-25 DIAGNOSIS — F411 Generalized anxiety disorder: Secondary | ICD-10-CM | POA: Diagnosis not present

## 2020-02-01 DIAGNOSIS — F332 Major depressive disorder, recurrent severe without psychotic features: Secondary | ICD-10-CM | POA: Diagnosis not present

## 2020-02-01 DIAGNOSIS — F411 Generalized anxiety disorder: Secondary | ICD-10-CM | POA: Diagnosis not present

## 2020-02-08 DIAGNOSIS — F411 Generalized anxiety disorder: Secondary | ICD-10-CM | POA: Diagnosis not present

## 2020-02-08 DIAGNOSIS — F332 Major depressive disorder, recurrent severe without psychotic features: Secondary | ICD-10-CM | POA: Diagnosis not present

## 2020-02-10 DIAGNOSIS — F332 Major depressive disorder, recurrent severe without psychotic features: Secondary | ICD-10-CM | POA: Diagnosis not present

## 2020-02-10 DIAGNOSIS — F411 Generalized anxiety disorder: Secondary | ICD-10-CM | POA: Diagnosis not present

## 2020-02-15 DIAGNOSIS — F332 Major depressive disorder, recurrent severe without psychotic features: Secondary | ICD-10-CM | POA: Diagnosis not present

## 2020-02-15 DIAGNOSIS — F411 Generalized anxiety disorder: Secondary | ICD-10-CM | POA: Diagnosis not present

## 2020-02-22 DIAGNOSIS — F332 Major depressive disorder, recurrent severe without psychotic features: Secondary | ICD-10-CM | POA: Diagnosis not present

## 2020-02-22 DIAGNOSIS — F411 Generalized anxiety disorder: Secondary | ICD-10-CM | POA: Diagnosis not present

## 2020-02-25 ENCOUNTER — Ambulatory Visit: Payer: Medicare Other | Admitting: Registered Nurse

## 2020-02-25 ENCOUNTER — Encounter: Payer: Medicare Other | Attending: Physical Medicine & Rehabilitation | Admitting: Registered Nurse

## 2020-02-25 ENCOUNTER — Other Ambulatory Visit: Payer: Self-pay

## 2020-02-25 ENCOUNTER — Encounter: Payer: Self-pay | Admitting: Registered Nurse

## 2020-02-25 VITALS — Ht 65.0 in | Wt 190.0 lb

## 2020-02-25 DIAGNOSIS — Z5181 Encounter for therapeutic drug level monitoring: Secondary | ICD-10-CM

## 2020-02-25 DIAGNOSIS — M79605 Pain in left leg: Secondary | ICD-10-CM | POA: Insufficient documentation

## 2020-02-25 DIAGNOSIS — G894 Chronic pain syndrome: Secondary | ICD-10-CM

## 2020-02-25 DIAGNOSIS — R4189 Other symptoms and signs involving cognitive functions and awareness: Secondary | ICD-10-CM

## 2020-02-25 DIAGNOSIS — Z79891 Long term (current) use of opiate analgesic: Secondary | ICD-10-CM | POA: Diagnosis not present

## 2020-02-25 DIAGNOSIS — M79671 Pain in right foot: Secondary | ICD-10-CM | POA: Insufficient documentation

## 2020-02-25 DIAGNOSIS — S060X9S Concussion with loss of consciousness of unspecified duration, sequela: Secondary | ICD-10-CM | POA: Insufficient documentation

## 2020-02-25 DIAGNOSIS — G43909 Migraine, unspecified, not intractable, without status migrainosus: Secondary | ICD-10-CM | POA: Insufficient documentation

## 2020-02-25 DIAGNOSIS — S82891S Other fracture of right lower leg, sequela: Secondary | ICD-10-CM | POA: Diagnosis not present

## 2020-02-25 DIAGNOSIS — S82892S Other fracture of left lower leg, sequela: Secondary | ICD-10-CM | POA: Diagnosis not present

## 2020-02-25 DIAGNOSIS — R202 Paresthesia of skin: Secondary | ICD-10-CM | POA: Insufficient documentation

## 2020-02-25 DIAGNOSIS — R011 Cardiac murmur, unspecified: Secondary | ICD-10-CM | POA: Insufficient documentation

## 2020-02-25 DIAGNOSIS — S069X0S Unspecified intracranial injury without loss of consciousness, sequela: Secondary | ICD-10-CM | POA: Diagnosis not present

## 2020-02-25 DIAGNOSIS — D649 Anemia, unspecified: Secondary | ICD-10-CM | POA: Insufficient documentation

## 2020-02-25 DIAGNOSIS — Z79899 Other long term (current) drug therapy: Secondary | ICD-10-CM | POA: Insufficient documentation

## 2020-02-25 DIAGNOSIS — Z760 Encounter for issue of repeat prescription: Secondary | ICD-10-CM | POA: Insufficient documentation

## 2020-02-25 DIAGNOSIS — R531 Weakness: Secondary | ICD-10-CM | POA: Insufficient documentation

## 2020-02-25 DIAGNOSIS — F068 Other specified mental disorders due to known physiological condition: Secondary | ICD-10-CM

## 2020-02-25 DIAGNOSIS — R42 Dizziness and giddiness: Secondary | ICD-10-CM | POA: Insufficient documentation

## 2020-02-25 DIAGNOSIS — Z7722 Contact with and (suspected) exposure to environmental tobacco smoke (acute) (chronic): Secondary | ICD-10-CM | POA: Insufficient documentation

## 2020-02-25 MED ORDER — OXYCODONE HCL 10 MG PO TABS: 10.0000 mg | ORAL_TABLET | Freq: Three times a day (TID) | ORAL | 0 refills | Status: DC | PRN

## 2020-02-25 NOTE — Progress Notes (Addendum)
Subjective:    Patient ID: Monique [name], female    DOB: [birth year], 34 y.o.   MRN: [id number]  HPI: Monique [name] is a 34 y.o. female whose appointment was changed to a tele-heath visit, she arrived to the office with her child. Due to the [facility] at this time, no children are allowed. This provider placed a call to Monique [name] regarding the above she verbalizes understanding and agrees with tele-health visit.  She states her pain is located in her bilateral ankles and bilateral feet. She rates her pain 5. Her current exercise regime is walking and performing stretching exercises.  Monique [name] Morphine equivalent is 45.00 MME.    Last Oral Swab 12/29/2019, it was consistent.    Pain Inventory Average Pain 7 Pain Right Now 5 My pain is constant, sharp, tingling and aching  In the last 24 hours, has pain interfered with the following? General activity 8 Relation with others 8 Enjoyment of life 8 What TIME of day is your pain at its worst? evening and night Sleep (in general) Fair  Pain is worse with: walking, bending and some activites Pain improves with: rest and medication Relief from Meds: 8  Mobility how many minutes can you walk? 2-5 ability to climb steps?  yes do you drive?  yes  Function disabled: date disabled .  Neuro/Psych weakness tingling spasms dizziness  Prior Studies Any changes since last visit?  no  Physicians involved in your care Any changes since last visit?  no   Family History  Problem Relation Age of Onset  . Cancer Paternal Grandmother        Ovarian   Social History   Socioeconomic History  . Marital status: Single    Spouse name: Not on file  . Number of children: Not on file  . Years of education: Not on file  . Highest education level: Not on file  Occupational History  . Not on file  Tobacco Use  . Smoking status: Passive Smoke Exposure - Never Smoker  . Smokeless tobacco: Never Used  Substance  and Sexual Activity  . Alcohol use: Yes    Comment: occasional  . Drug use: No  . Sexual activity: Yes  Other Topics Concern  . Not on file  Social History Narrative  . Not on file   Social Determinants of Health   Financial Resource Strain:   . Difficulty of Paying Living Expenses:   Food Insecurity:   . Worried About Charity fundraiser in the Last Year:   . Arboriculturist in the Last Year:   Transportation Needs:   . Film/video editor (Medical):   Marland Kitchen Lack of Transportation (Non-Medical):   Physical Activity:   . Days of Exercise per Week:   . Minutes of Exercise per Session:   Stress:   . Feeling of Stress :   Social Connections:   . Frequency of Communication with Friends and Family:   . Frequency of Social Gatherings with Friends and Family:   . Attends Religious Services:   . Active Member of Clubs or Organizations:   . Attends Archivist Meetings:   Marland Kitchen Marital Status:    Past Surgical History:  Procedure Laterality Date  . APPENDECTOMY    . Ceasarean section  2007, 2010,2014  . EXTERNAL FIXATION LEG Bilateral 12/14/2012   Procedure: EXTERNAL FIXATION LEG;  Surgeon: [name], MD;  Location: [place];  Service: Orthopedics;  Laterality: Bilateral;  .  EXTERNAL FIXATION REMOVAL Bilateral 12/23/2012   Procedure: REMOVAL EXTERNAL FIXATION LEG;  Surgeon: [name], MD;  Location: [place];  Service: Orthopedics;  Laterality: Bilateral;  . I & D EXTREMITY Left 12/14/2012   Procedure: IRRIGATION AND DEBRIDEMENT EXTREMITY;  Surgeon: [name], MD;  Location: [place];  Service: Orthopedics;  Laterality: Left;  . ORIF ANKLE FRACTURE Left 12/14/2012   Procedure: OPEN REDUCTION INTERNAL FIXATION (ORIF) ANKLE FRACTURE;  Surgeon: [name], MD;  Location: [place];  Service: Orthopedics;  Laterality: Left;  . ORIF TIBIA FRACTURE Bilateral 12/23/2012   Procedure: OPEN REDUCTION INTERNAL FIXATION (ORIF) BILATERAL DISTAL TIBIA FRACTURE;  Surgeon: [name],  MD;  Location: [place];  Service: Orthopedics;  Laterality: Bilateral;   Past Medical History:  Diagnosis Date  . Anemia   . Anxiety   . Depression   . Heart murmur    Ht 5\' 5"  (1.651 m) Comment: pt reported, virtual visit  Wt 190 lb (86.2 kg) Comment: pt reported, virtual visit  BMI 31.62 kg/m   Opioid Risk Score:   Fall Risk Score:  `1  Depression screen PHQ 2/9  Depression screen [facility] 2/9 04/27/2019 11/05/2018 11/11/2017 07/15/2017 12/24/2014  Decreased Interest 0 1 0 0 2  Down, Depressed, Hopeless 0 1 0 0 2  PHQ - 2 Score 0 2 0 0 4  Altered sleeping - - - - 3  Tired, decreased energy - - - - 3  Change in appetite - - - - 2  Feeling bad or failure about yourself  - - - - 2  Trouble concentrating - - - - 3  Moving slowly or fidgety/restless - - - - 3  Suicidal thoughts - - - - 0  PHQ-9 Score - - - - 20     Review of Systems  Neurological: Positive for weakness.       Tingling spasms  All other systems reviewed and are negative.      Objective:   Physical Exam Vitals and nursing note reviewed.  Musculoskeletal:     Comments: No Physical Exam Performed: Virtual Visit           Assessment & Plan:  1.TBI:Cognitive deficit:Continue to Monitor.02/25/2020. Continue with current treatment:using the various devices to help with your Organization such as notes, calendars and using her cell phone to set reminders. 2. Chronic Pain Syndrome: Refilled:Oxycodone 10 mg one tablet TID as needed as needed #75. Second scripte-scribefor the following month.02/25/2020 3. Bilateral Pilon Fractures: Continueto Monitor.02/25/2020 4. Tibial Nerve Injuries: Continue to monitor.02/25/2020 5. Migraine:No complaints today.Continue :Naproxen [id number] to monitor. 02/25/2020   F/U Visit in 2 months  Telephone Visit Location of Patient: In her Car Location of Provider: In Her Office Established Patient Total Time Spent: 15 Minutes

## 2020-02-26 DIAGNOSIS — F411 Generalized anxiety disorder: Secondary | ICD-10-CM | POA: Diagnosis not present

## 2020-02-26 DIAGNOSIS — F332 Major depressive disorder, recurrent severe without psychotic features: Secondary | ICD-10-CM | POA: Diagnosis not present

## 2020-02-29 DIAGNOSIS — F332 Major depressive disorder, recurrent severe without psychotic features: Secondary | ICD-10-CM | POA: Diagnosis not present

## 2020-02-29 DIAGNOSIS — F411 Generalized anxiety disorder: Secondary | ICD-10-CM | POA: Diagnosis not present

## 2020-03-09 DIAGNOSIS — F332 Major depressive disorder, recurrent severe without psychotic features: Secondary | ICD-10-CM | POA: Diagnosis not present

## 2020-03-09 DIAGNOSIS — F411 Generalized anxiety disorder: Secondary | ICD-10-CM | POA: Diagnosis not present

## 2020-03-14 DIAGNOSIS — F411 Generalized anxiety disorder: Secondary | ICD-10-CM | POA: Diagnosis not present

## 2020-03-14 DIAGNOSIS — F332 Major depressive disorder, recurrent severe without psychotic features: Secondary | ICD-10-CM | POA: Diagnosis not present

## 2020-03-21 DIAGNOSIS — F332 Major depressive disorder, recurrent severe without psychotic features: Secondary | ICD-10-CM | POA: Diagnosis not present

## 2020-03-21 DIAGNOSIS — F411 Generalized anxiety disorder: Secondary | ICD-10-CM | POA: Diagnosis not present

## 2020-03-28 DIAGNOSIS — F332 Major depressive disorder, recurrent severe without psychotic features: Secondary | ICD-10-CM | POA: Diagnosis not present

## 2020-03-28 DIAGNOSIS — F411 Generalized anxiety disorder: Secondary | ICD-10-CM | POA: Diagnosis not present

## 2020-04-04 DIAGNOSIS — F411 Generalized anxiety disorder: Secondary | ICD-10-CM | POA: Diagnosis not present

## 2020-04-04 DIAGNOSIS — F332 Major depressive disorder, recurrent severe without psychotic features: Secondary | ICD-10-CM | POA: Diagnosis not present

## 2020-04-07 DIAGNOSIS — F411 Generalized anxiety disorder: Secondary | ICD-10-CM | POA: Diagnosis not present

## 2020-04-07 DIAGNOSIS — F332 Major depressive disorder, recurrent severe without psychotic features: Secondary | ICD-10-CM | POA: Diagnosis not present

## 2020-04-18 DIAGNOSIS — F411 Generalized anxiety disorder: Secondary | ICD-10-CM | POA: Diagnosis not present

## 2020-04-18 DIAGNOSIS — F332 Major depressive disorder, recurrent severe without psychotic features: Secondary | ICD-10-CM | POA: Diagnosis not present

## 2020-04-25 DIAGNOSIS — F411 Generalized anxiety disorder: Secondary | ICD-10-CM | POA: Diagnosis not present

## 2020-04-25 DIAGNOSIS — F332 Major depressive disorder, recurrent severe without psychotic features: Secondary | ICD-10-CM | POA: Diagnosis not present

## 2020-04-26 ENCOUNTER — Encounter: Payer: Medicare Other | Attending: Physical Medicine & Rehabilitation | Admitting: Registered Nurse

## 2020-04-26 ENCOUNTER — Ambulatory Visit
Admission: RE | Admit: 2020-04-26 | Discharge: 2020-04-26 | Disposition: A | Discharge status: Home / Self Care | Payer: Medicare Other | Source: Ambulatory Visit | Attending: Registered Nurse | Admitting: Registered Nurse

## 2020-04-26 ENCOUNTER — Other Ambulatory Visit: Payer: Self-pay

## 2020-04-26 ENCOUNTER — Encounter: Payer: Self-pay | Admitting: Registered Nurse

## 2020-04-26 VITALS — BP 101/66 | HR 76 | Temp 98.5°F | Ht 65.0 in | Wt 205.6 lb

## 2020-04-26 DIAGNOSIS — F068 Other specified mental disorders due to known physiological condition: Secondary | ICD-10-CM | POA: Diagnosis not present

## 2020-04-26 DIAGNOSIS — Z5181 Encounter for therapeutic drug level monitoring: Secondary | ICD-10-CM

## 2020-04-26 DIAGNOSIS — R202 Paresthesia of skin: Secondary | ICD-10-CM | POA: Insufficient documentation

## 2020-04-26 DIAGNOSIS — M79671 Pain in right foot: Secondary | ICD-10-CM | POA: Diagnosis not present

## 2020-04-26 DIAGNOSIS — S060X9S Concussion with loss of consciousness of unspecified duration, sequela: Secondary | ICD-10-CM | POA: Diagnosis not present

## 2020-04-26 DIAGNOSIS — S82892S Other fracture of left lower leg, sequela: Secondary | ICD-10-CM | POA: Insufficient documentation

## 2020-04-26 DIAGNOSIS — R011 Cardiac murmur, unspecified: Secondary | ICD-10-CM | POA: Diagnosis not present

## 2020-04-26 DIAGNOSIS — Z79891 Long term (current) use of opiate analgesic: Secondary | ICD-10-CM

## 2020-04-26 DIAGNOSIS — M79605 Pain in left leg: Secondary | ICD-10-CM | POA: Insufficient documentation

## 2020-04-26 DIAGNOSIS — M542 Cervicalgia: Secondary | ICD-10-CM | POA: Diagnosis not present

## 2020-04-26 DIAGNOSIS — R531 Weakness: Secondary | ICD-10-CM | POA: Insufficient documentation

## 2020-04-26 DIAGNOSIS — G43909 Migraine, unspecified, not intractable, without status migrainosus: Secondary | ICD-10-CM | POA: Diagnosis not present

## 2020-04-26 DIAGNOSIS — G894 Chronic pain syndrome: Secondary | ICD-10-CM

## 2020-04-26 DIAGNOSIS — Z760 Encounter for issue of repeat prescription: Secondary | ICD-10-CM | POA: Insufficient documentation

## 2020-04-26 DIAGNOSIS — R42 Dizziness and giddiness: Secondary | ICD-10-CM | POA: Insufficient documentation

## 2020-04-26 DIAGNOSIS — Z7722 Contact with and (suspected) exposure to environmental tobacco smoke (acute) (chronic): Secondary | ICD-10-CM | POA: Insufficient documentation

## 2020-04-26 DIAGNOSIS — Z79899 Other long term (current) drug therapy: Secondary | ICD-10-CM | POA: Diagnosis not present

## 2020-04-26 DIAGNOSIS — S82891S Other fracture of right lower leg, sequela: Secondary | ICD-10-CM | POA: Diagnosis present

## 2020-04-26 DIAGNOSIS — D649 Anemia, unspecified: Secondary | ICD-10-CM | POA: Diagnosis not present

## 2020-04-26 DIAGNOSIS — S069X0S Unspecified intracranial injury without loss of consciousness, sequela: Secondary | ICD-10-CM
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
## 2020-04-27 ENCOUNTER — Telehealth: Payer: Self-pay | Admitting: Registered Nurse

## 2020-04-27 NOTE — Telephone Encounter (Signed)
Placed a call to [name], we discussed her X-ray results. She verbalizes understanding.

## 2020-05-02 ENCOUNTER — Telehealth: Payer: Self-pay | Admitting: Registered Nurse

## 2020-05-02 DIAGNOSIS — F332 Major depressive disorder, recurrent severe without psychotic features: Secondary | ICD-10-CM | POA: Diagnosis not present

## 2020-05-02 DIAGNOSIS — F411 Generalized anxiety disorder: Secondary | ICD-10-CM | POA: Diagnosis not present

## 2020-05-02 NOTE — Telephone Encounter (Signed)
Placed a call to Monique [name] regarding her medication, no answer,left message to return the call.

## 2020-05-09 DIAGNOSIS — F332 Major depressive disorder, recurrent severe without psychotic features: Secondary | ICD-10-CM | POA: Diagnosis not present

## 2020-05-09 DIAGNOSIS — F411 Generalized anxiety disorder: Secondary | ICD-10-CM | POA: Diagnosis not present

## 2020-05-16 DIAGNOSIS — F411 Generalized anxiety disorder: Secondary | ICD-10-CM | POA: Diagnosis not present

## 2020-05-16 DIAGNOSIS — F332 Major depressive disorder, recurrent severe without psychotic features: Secondary | ICD-10-CM | POA: Diagnosis not present

## 2020-05-17 ENCOUNTER — Other Ambulatory Visit: Payer: Self-pay

## 2020-05-17 ENCOUNTER — Encounter: Payer: Medicare Other | Admitting: Registered Nurse

## 2020-05-23 DIAGNOSIS — F332 Major depressive disorder, recurrent severe without psychotic features: Secondary | ICD-10-CM | POA: Diagnosis not present

## 2020-05-23 DIAGNOSIS — F411 Generalized anxiety disorder: Secondary | ICD-10-CM | POA: Diagnosis not present

## 2020-05-30 DIAGNOSIS — F411 Generalized anxiety disorder: Secondary | ICD-10-CM | POA: Diagnosis not present

## 2020-05-30 DIAGNOSIS — F332 Major depressive disorder, recurrent severe without psychotic features: Secondary | ICD-10-CM | POA: Diagnosis not present

## 2020-06-01 DIAGNOSIS — F411 Generalized anxiety disorder: Secondary | ICD-10-CM | POA: Diagnosis not present

## 2020-06-01 DIAGNOSIS — F332 Major depressive disorder, recurrent severe without psychotic features: Secondary | ICD-10-CM | POA: Diagnosis not present

## 2020-06-02 ENCOUNTER — Ambulatory Visit: Payer: Medicare Other | Attending: Internal Medicine | Admitting: Internal Medicine

## 2020-06-02 ENCOUNTER — Encounter: Payer: Medicare Other | Attending: Physical Medicine & Rehabilitation | Admitting: Registered Nurse

## 2020-06-02 ENCOUNTER — Other Ambulatory Visit: Payer: Self-pay

## 2020-06-02 ENCOUNTER — Encounter: Payer: Self-pay | Admitting: Internal Medicine

## 2020-06-02 ENCOUNTER — Telehealth: Payer: Self-pay | Admitting: *Deleted

## 2020-06-02 ENCOUNTER — Encounter: Payer: Self-pay | Admitting: Registered Nurse

## 2020-06-02 VITALS — BP 100/67 | HR 77 | Temp 98.4°F | Resp 16 | Ht 65.0 in | Wt 212.2 lb

## 2020-06-02 DIAGNOSIS — S82891S Other fracture of right lower leg, sequela: Secondary | ICD-10-CM | POA: Insufficient documentation

## 2020-06-02 DIAGNOSIS — K219 Gastro-esophageal reflux disease without esophagitis: Secondary | ICD-10-CM | POA: Diagnosis not present

## 2020-06-02 DIAGNOSIS — Z7689 Persons encountering health services in other specified circumstances: Secondary | ICD-10-CM | POA: Diagnosis not present

## 2020-06-02 DIAGNOSIS — L910 Hypertrophic scar: Secondary | ICD-10-CM

## 2020-06-02 DIAGNOSIS — T402X5S Adverse effect of other opioids, sequela: Secondary | ICD-10-CM | POA: Diagnosis not present

## 2020-06-02 DIAGNOSIS — Z79899 Other long term (current) drug therapy: Secondary | ICD-10-CM | POA: Diagnosis not present

## 2020-06-02 DIAGNOSIS — Z7722 Contact with and (suspected) exposure to environmental tobacco smoke (acute) (chronic): Secondary | ICD-10-CM | POA: Insufficient documentation

## 2020-06-02 DIAGNOSIS — M542 Cervicalgia: Secondary | ICD-10-CM | POA: Insufficient documentation

## 2020-06-02 DIAGNOSIS — Z79891 Long term (current) use of opiate analgesic: Secondary | ICD-10-CM | POA: Insufficient documentation

## 2020-06-02 DIAGNOSIS — S060X9S Concussion with loss of consciousness of unspecified duration, sequela: Secondary | ICD-10-CM | POA: Insufficient documentation

## 2020-06-02 DIAGNOSIS — Z87820 Personal history of traumatic brain injury: Secondary | ICD-10-CM | POA: Diagnosis not present

## 2020-06-02 DIAGNOSIS — Z87828 Personal history of other (healed) physical injury and trauma: Secondary | ICD-10-CM | POA: Insufficient documentation

## 2020-06-02 DIAGNOSIS — Z9049 Acquired absence of other specified parts of digestive tract: Secondary | ICD-10-CM | POA: Insufficient documentation

## 2020-06-02 DIAGNOSIS — G894 Chronic pain syndrome: Secondary | ICD-10-CM | POA: Insufficient documentation

## 2020-06-02 DIAGNOSIS — R42 Dizziness and giddiness: Secondary | ICD-10-CM | POA: Insufficient documentation

## 2020-06-02 DIAGNOSIS — Z791 Long term (current) use of non-steroidal anti-inflammatories (NSAID): Secondary | ICD-10-CM | POA: Diagnosis not present

## 2020-06-02 DIAGNOSIS — S82892S Other fracture of left lower leg, sequela: Secondary | ICD-10-CM | POA: Insufficient documentation

## 2020-06-02 DIAGNOSIS — M79671 Pain in right foot: Secondary | ICD-10-CM | POA: Insufficient documentation

## 2020-06-02 DIAGNOSIS — A630 Anogenital (venereal) warts: Secondary | ICD-10-CM | POA: Insufficient documentation

## 2020-06-02 DIAGNOSIS — D649 Anemia, unspecified: Secondary | ICD-10-CM | POA: Insufficient documentation

## 2020-06-02 DIAGNOSIS — Z8781 Personal history of (healed) traumatic fracture: Secondary | ICD-10-CM | POA: Insufficient documentation

## 2020-06-02 DIAGNOSIS — D509 Iron deficiency anemia, unspecified: Secondary | ICD-10-CM | POA: Diagnosis not present

## 2020-06-02 DIAGNOSIS — G43909 Migraine, unspecified, not intractable, without status migrainosus: Secondary | ICD-10-CM | POA: Insufficient documentation

## 2020-06-02 DIAGNOSIS — R531 Weakness: Secondary | ICD-10-CM | POA: Insufficient documentation

## 2020-06-02 DIAGNOSIS — Z2821 Immunization not carried out because of patient refusal: Secondary | ICD-10-CM

## 2020-06-02 DIAGNOSIS — M79605 Pain in left leg: Secondary | ICD-10-CM | POA: Insufficient documentation

## 2020-06-02 DIAGNOSIS — R011 Cardiac murmur, unspecified: Secondary | ICD-10-CM | POA: Insufficient documentation

## 2020-06-02 DIAGNOSIS — Z5181 Encounter for therapeutic drug level monitoring: Secondary | ICD-10-CM | POA: Insufficient documentation

## 2020-06-02 DIAGNOSIS — Z760 Encounter for issue of repeat prescription: Secondary | ICD-10-CM | POA: Insufficient documentation

## 2020-06-02 DIAGNOSIS — R202 Paresthesia of skin: Secondary | ICD-10-CM | POA: Insufficient documentation

## 2020-06-02 DIAGNOSIS — E559 Vitamin D deficiency, unspecified: Secondary | ICD-10-CM | POA: Diagnosis not present

## 2020-06-02 DIAGNOSIS — T402X5A Adverse effect of other opioids, initial encounter: Secondary | ICD-10-CM

## 2020-06-02 DIAGNOSIS — K5903 Drug induced constipation: Secondary | ICD-10-CM | POA: Insufficient documentation

## 2020-06-02 MED ORDER — NAPROXEN 500 MG PO TABS: 500.0000 mg | ORAL_TABLET | Freq: Two times a day (BID) | ORAL | 0 refills | Status: DC | PRN

## 2020-06-02 MED ORDER — OMEPRAZOLE 20 MG PO CPDR: 20.0000 mg | DELAYED_RELEASE_CAPSULE | Freq: Every day | ORAL | 3 refills | Status: DC

## 2020-06-02 MED ORDER — FERROUS SULFATE 325 (65 FE) MG PO TABS: 325.0000 mg | ORAL_TABLET | Freq: Every day | ORAL | 1 refills | Status: DC

## 2020-06-02 MED ORDER — OXYCODONE HCL 10 MG PO TABS: 10.0000 mg | ORAL_TABLET | Freq: Two times a day (BID) | ORAL | 0 refills | Status: DC | PRN

## 2020-06-02 NOTE — Patient Instructions (Addendum)
I have sent a prescription to your pharmacy for omeprazole to help with the acid reflux.  I have also sent a prescription for iron supplement called ferrous sulfate 325 mg daily.  If your insurance does not pay for the iron supplement, you can purchase it over-the-counter.  Food Choices for Gastroesophageal Reflux Disease, Adult When you have gastroesophageal reflux disease (GERD), the foods you eat and your eating habits are very important. Choosing the right foods can help ease your discomfort. Think about working with a nutrition specialist (dietitian) to help you make good choices. What are tips for following this plan?  Meals  Choose healthy foods that are low in fat, such as fruits, vegetables, whole grains, low-fat dairy products, and lean meat, fish, and poultry.  Eat small meals often instead of 3 large meals a day. Eat your meals slowly, and in a place where you are relaxed. Avoid bending over or lying down until 2-3 hours after eating.  Avoid eating meals 2-3 hours before bed.  Avoid drinking a lot of liquid with meals.  Cook foods using methods other than frying. Bake, grill, or broil food instead.  Avoid or limit: ? Chocolate. ? Peppermint or spearmint. ? Alcohol. ? Pepper. ? Black and decaffeinated coffee. ? Black and decaffeinated tea. ? Bubbly (carbonated) soft drinks. ? Caffeinated energy drinks and soft drinks.  Limit high-fat foods such as: ? Fatty meat or fried foods. ? Whole milk, cream, butter, or ice cream. ? Nuts and nut butters. ? Pastries, donuts, and sweets made with butter or shortening.  Avoid foods that cause symptoms. These foods may be different for everyone. Common foods that cause symptoms include: ? Tomatoes. ? Oranges, lemons, and limes. ? Peppers. ? Spicy food. ? Onions and garlic. ? Vinegar. Lifestyle  Maintain a healthy weight. Ask your doctor what weight is healthy for you. If you need to lose weight, work with your doctor to do so  safely.  Exercise for at least 30 minutes for 5 or more days each week, or as told by your doctor.  Wear loose-fitting clothes.  Do not smoke. If you need help quitting, ask your doctor.  Sleep with the head of your bed higher than your feet. Use a wedge under the mattress or blocks under the bed frame to raise the head of the bed. Summary  When you have gastroesophageal reflux disease (GERD), food and lifestyle choices are very important in easing your symptoms.  Eat small meals often instead of 3 large meals a day. Eat your meals slowly, and in a place where you are relaxed.  Limit high-fat foods such as fatty meat or fried foods.  Avoid bending over or lying down until 2-3 hours after eating.  Avoid peppermint and spearmint, caffeine, alcohol, and chocolate. This information is not intended to replace advice given to you by your health care provider. Make sure you discuss any questions you have with your health care provider. Document Revised: 01/15/2019 Document Reviewed: 10/30/2016 Elsevier Patient Education  [place].

## 2020-06-02 NOTE — Progress Notes (Addendum)
Subjective:    Patient ID: Monique [name], female    DOB: 1986-05-07, 34 y.o.   MRN: [id number]  HPI: Monique [name] is a 34 y.o. female who has a tele-health telephone visit, Monique [name] agrees with tele-health visit.She states her pain is located in her bilateral feet. She rates her pain 6. Her current exercise regime is walking and performing stretching exercises.  Monique [name] reports 10 days ago she was in the shower and lost her balanced and fell on her buttocks. Educated on [employer], she verbalizes understanding.   Monique [name] Morphine equivalent is 37.50 MME.  Last Oral Swab was Performed on 12/29/2019, it was consistent.    Pain Inventory Average Pain 7 Pain Right Now 6 My pain is constant, sharp, stabbing and tingling  In the last 24 hours, has pain interfered with the following? General activity 8 Relation with others 7 Enjoyment of life 7 What TIME of day is your pain at its worst? evening and night Sleep (in general) Poor  Pain is worse with: walking, bending and some activites Pain improves with: rest, heat/ice and medication Relief from Meds: 8  Family History  Problem Relation Age of Onset  . Diabetes Father   . Cancer Paternal Grandmother        Ovarian   Social History   Socioeconomic History  . Marital status: Single    Spouse name: Not on file  . Number of children: 3  . Years of education: Not on file  . Highest education level: Some college, no degree  Occupational History  . Occupation: Disability  Tobacco Use  . Smoking status: Passive Smoke Exposure - Never Smoker  . Smokeless tobacco: Never Used  Vaping Use  . Vaping Use: Never used  Substance and Sexual Activity  . Alcohol use: Yes    Comment: occasional  . Drug use: No  . Sexual activity: Yes  Other Topics Concern  . Not on file  Social History Narrative  . Not on file   Social Determinants of Health   Financial Resource Strain:   . Difficulty of Paying  Living Expenses: Not on file  Food Insecurity:   . Worried About Charity fundraiser in the Last Year: Not on file  . Ran Out of Food in the Last Year: Not on file  Transportation Needs:   . Lack of Transportation (Medical): Not on file  . Lack of Transportation (Non-Medical): Not on file  Physical Activity:   . Days of Exercise per Week: Not on file  . Minutes of Exercise per Session: Not on file  Stress:   . Feeling of Stress : Not on file  Social Connections:   . Frequency of Communication with Friends and Family: Not on file  . Frequency of Social Gatherings with Friends and Family: Not on file  . Attends Religious Services: Not on file  . Active Member of Clubs or Organizations: Not on file  . Attends Archivist Meetings: Not on file  . Marital Status: Not on file   Past Surgical History:  Procedure Laterality Date  . APPENDECTOMY    . Ceasarean section  2007, 2010,2014  . EXTERNAL FIXATION LEG Bilateral 12/14/2012   Procedure: EXTERNAL FIXATION LEG;  Surgeon: [name], MD;  Location: [place];  Service: Orthopedics;  Laterality: Bilateral;  . EXTERNAL FIXATION REMOVAL Bilateral 12/23/2012   Procedure: REMOVAL EXTERNAL FIXATION LEG;  Surgeon: [name], MD;  Location: [place];  Service: Orthopedics;  Laterality: Bilateral;  . I & D EXTREMITY Left 12/14/2012   Procedure: IRRIGATION AND DEBRIDEMENT EXTREMITY;  Surgeon: [name], MD;  Location: [place];  Service: Orthopedics;  Laterality: Left;  . ORIF ANKLE FRACTURE Left 12/14/2012   Procedure: OPEN REDUCTION INTERNAL FIXATION (ORIF) ANKLE FRACTURE;  Surgeon: [name], MD;  Location: [place];  Service: Orthopedics;  Laterality: Left;  . ORIF TIBIA FRACTURE Bilateral 12/23/2012   Procedure: OPEN REDUCTION INTERNAL FIXATION (ORIF) BILATERAL DISTAL TIBIA FRACTURE;  Surgeon: [name], MD;  Location: [place];  Service: Orthopedics;  Laterality: Bilateral;   Past Surgical History:  Procedure Laterality Date  .  APPENDECTOMY    . Ceasarean section  2007, 2010,2014  . EXTERNAL FIXATION LEG Bilateral 12/14/2012   Procedure: EXTERNAL FIXATION LEG;  Surgeon: [name], MD;  Location: [place];  Service: Orthopedics;  Laterality: Bilateral;  . EXTERNAL FIXATION REMOVAL Bilateral 12/23/2012   Procedure: REMOVAL EXTERNAL FIXATION LEG;  Surgeon: [name], MD;  Location: [place];  Service: Orthopedics;  Laterality: Bilateral;  . I & D EXTREMITY Left 12/14/2012   Procedure: IRRIGATION AND DEBRIDEMENT EXTREMITY;  Surgeon: [name], MD;  Location: [place];  Service: Orthopedics;  Laterality: Left;  . ORIF ANKLE FRACTURE Left 12/14/2012   Procedure: OPEN REDUCTION INTERNAL FIXATION (ORIF) ANKLE FRACTURE;  Surgeon: [name], MD;  Location: [place];  Service: Orthopedics;  Laterality: Left;  . ORIF TIBIA FRACTURE Bilateral 12/23/2012   Procedure: OPEN REDUCTION INTERNAL FIXATION (ORIF) BILATERAL DISTAL TIBIA FRACTURE;  Surgeon: [name], MD;  Location: [place];  Service: Orthopedics;  Laterality: Bilateral;   Past Medical History:  Diagnosis Date  . Anemia   . Anxiety   . Depression   . Heart murmur    BP 100/67   Pulse 77   Ht 5\' 5"  (1.651 m)   Wt 212 lb (96.2 kg)   BMI 35.28 kg/m   Opioid Risk Score:   Fall Risk Score:  `1  Depression screen PHQ 2/9  Depression screen [facility] 2/9 06/02/2020 04/26/2020 04/27/2019 11/05/2018 11/11/2017 07/15/2017 12/24/2014  Decreased Interest 2 0 0 1 0 0 2  Down, Depressed, Hopeless 0 0 0 1 0 0 2  PHQ - 2 Score 2 0 0 2 0 0 4  Altered sleeping 3 - - - - - 3  Tired, decreased energy 3 - - - - - 3  Change in appetite 1 - - - - - 2  Feeling bad or failure about yourself  1 - - - - - 2  Trouble concentrating 2 - - - - - 3  Moving slowly or fidgety/restless 0 - - - - - 3  Suicidal thoughts 0 - - - - - 0  PHQ-9 Score 12 - - - - - 20    Review of Systems  Constitutional: Negative.   Eyes: Negative.   Respiratory: Negative.   Cardiovascular: Negative.     Gastrointestinal: Negative.   Endocrine: Negative.   Genitourinary: Negative.   Musculoskeletal: Positive for gait problem.  Skin: Negative.   Allergic/Immunologic: Negative.   Hematological: Negative.   Psychiatric/Behavioral: Negative.   All other systems reviewed and are negative.      Objective:   Physical Exam Vitals and nursing note reviewed.  Musculoskeletal:     Comments: No Physical Examine Performed: Tele-Health visit           Assessment & Plan:  1.TBI:Cognitive deficit:Continue to Monitor.06/02/2020. Continue with current treatment:using  the various devices to help with your Organization such as notes, calendars and using her cell phone to set reminders. 2. Chronic Pain Syndrome: Continue with  slow weaning of Oxycodone.  Continue :Oxycodone 10 mg 1 tablet BID  as needed as needed # 60.  We will continue the opioid monitoring program, this consists of regular clinic visits, examinations, urine drug screen, pill counts as well as use of [place] Controlled Substance Reporting system. A 12 month History has been reviewed on the [place] Controlled Substance Reporting System on 06/02/2020. 3. Bilateral Pilon Fractures: Continueto Monitor.06/02/2020 4. Tibial Nerve Injuries: Continue to monitor.06/02/2020 5. Migraine:No complaints today.Continue :NaproxenPRN:Continue to monitor. 06/02/2020  F/U Visit in 2 months Tele-Health Visit:  Telephone Call Established Patient Location of Patient: In her Home Location of Provider: In the Office Total Time Spent: 10 Minutes
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
## 2020-06-02 NOTE — Telephone Encounter (Signed)
Monique [name] called to check on her medication if it was sent to the pharmacy and was supposed to get a call about an appointment since she had a fever and was sent home.

## 2020-06-03 LAB — COMPREHENSIVE METABOLIC PANEL
ALT: 12 IU/L (ref 0–32)
AST: 15 IU/L (ref 0–40)
Albumin/Globulin Ratio: 1.6 (ref 1.2–2.2)
Albumin: 4.2 g/dL (ref 3.8–4.8)
Alkaline Phosphatase: 66 IU/L (ref 48–121)
BUN/Creatinine Ratio: 16 (ref 9–23)
BUN: 12 mg/dL (ref 6–20)
Bilirubin Total: 0.2 mg/dL (ref 0.0–1.2)
CO2: 21 mmol/L (ref 20–29)
Calcium: 9.2 mg/dL (ref 8.7–10.2)
Chloride: 102 mmol/L (ref 96–106)
Creatinine, Ser: 0.76 mg/dL (ref 0.57–1.00)
GFR calc Af Amer: 118 mL/min/{1.73_m2} (ref >59)
GFR calc non Af Amer: 102 mL/min/{1.73_m2} (ref >59)
Globulin, Total: 2.6 g/dL (ref 1.5–4.5)
Glucose: 93 mg/dL (ref 65–99)
Potassium: 4.7 mmol/L (ref 3.5–5.2)
Sodium: 137 mmol/L (ref 134–144)
Total Protein: 6.8 g/dL (ref 6.0–8.5)

## 2020-06-03 LAB — CBC
Hematocrit: 30.8 % — ABNORMAL LOW (ref 34.0–46.6)
Hemoglobin: 9.1 g/dL — ABNORMAL LOW (ref 11.1–15.9)
MCH: 21.5 pg — ABNORMAL LOW (ref 26.6–33.0)
MCHC: 29.5 g/dL — ABNORMAL LOW (ref 31.5–35.7)
MCV: 73 fL — ABNORMAL LOW (ref 79–97)
Platelets: 404 10*3/uL (ref 150–450)
RBC: 4.23 x10E6/uL (ref 3.77–5.28)
RDW: 16.1 % — ABNORMAL HIGH (ref 11.7–15.4)
WBC: 5 10*3/uL (ref 3.4–10.8)

## 2020-06-03 LAB — IRON,TIBC AND FERRITIN PANEL
Ferritin: 3 ng/mL — ABNORMAL LOW (ref 15–150)
Iron Saturation: 4 % — CL (ref 15–55)
Iron: 17 ug/dL — ABNORMAL LOW (ref 27–159)
Total Iron Binding Capacity: 451 ug/dL — ABNORMAL HIGH (ref 250–450)
UIBC: 434 ug/dL — ABNORMAL HIGH (ref 131–425)

## 2020-06-04 ENCOUNTER — Telehealth: Payer: Self-pay

## 2020-06-04 NOTE — Telephone Encounter (Signed)
Contacted pt to go over lab results pt is aware and doesn't have any questions or concerns 

## 2020-06-06 DIAGNOSIS — F411 Generalized anxiety disorder: Secondary | ICD-10-CM | POA: Diagnosis not present

## 2020-06-06 DIAGNOSIS — F332 Major depressive disorder, recurrent severe without psychotic features: Secondary | ICD-10-CM | POA: Diagnosis not present

## 2020-06-09 DIAGNOSIS — F411 Generalized anxiety disorder: Secondary | ICD-10-CM | POA: Diagnosis not present

## 2020-06-09 DIAGNOSIS — F332 Major depressive disorder, recurrent severe without psychotic features: Secondary | ICD-10-CM | POA: Diagnosis not present

## 2020-06-20 DIAGNOSIS — F411 Generalized anxiety disorder: Secondary | ICD-10-CM | POA: Diagnosis not present

## 2020-06-20 DIAGNOSIS — F332 Major depressive disorder, recurrent severe without psychotic features: Secondary | ICD-10-CM | POA: Diagnosis not present

## 2020-06-27 DIAGNOSIS — F332 Major depressive disorder, recurrent severe without psychotic features: Secondary | ICD-10-CM | POA: Diagnosis not present

## 2020-06-27 DIAGNOSIS — F411 Generalized anxiety disorder: Secondary | ICD-10-CM | POA: Diagnosis not present

## 2020-06-28 ENCOUNTER — Ambulatory Visit: Payer: Medicare Other | Admitting: Registered Nurse

## 2020-07-05 DIAGNOSIS — F332 Major depressive disorder, recurrent severe without psychotic features: Secondary | ICD-10-CM | POA: Diagnosis not present

## 2020-07-05 DIAGNOSIS — F411 Generalized anxiety disorder: Secondary | ICD-10-CM | POA: Diagnosis not present

## 2020-07-11 DIAGNOSIS — F411 Generalized anxiety disorder: Secondary | ICD-10-CM | POA: Diagnosis not present

## 2020-07-11 DIAGNOSIS — F332 Major depressive disorder, recurrent severe without psychotic features: Secondary | ICD-10-CM | POA: Diagnosis not present

## 2020-07-15 ENCOUNTER — Telehealth: Payer: Medicare Other | Admitting: Internal Medicine

## 2020-07-18 DIAGNOSIS — F411 Generalized anxiety disorder: Secondary | ICD-10-CM | POA: Diagnosis not present

## 2020-07-18 DIAGNOSIS — F332 Major depressive disorder, recurrent severe without psychotic features: Secondary | ICD-10-CM | POA: Diagnosis not present

## 2020-07-20 DIAGNOSIS — F411 Generalized anxiety disorder: Secondary | ICD-10-CM | POA: Diagnosis not present

## 2020-07-20 DIAGNOSIS — F332 Major depressive disorder, recurrent severe without psychotic features: Secondary | ICD-10-CM | POA: Diagnosis not present

## 2020-07-22 ENCOUNTER — Ambulatory Visit: Payer: Medicare Other | Admitting: Internal Medicine

## 2020-07-25 DIAGNOSIS — F332 Major depressive disorder, recurrent severe without psychotic features: Secondary | ICD-10-CM | POA: Diagnosis not present

## 2020-07-25 DIAGNOSIS — F411 Generalized anxiety disorder: Secondary | ICD-10-CM | POA: Diagnosis not present

## 2020-08-01 DIAGNOSIS — F411 Generalized anxiety disorder: Secondary | ICD-10-CM | POA: Diagnosis not present

## 2020-08-01 DIAGNOSIS — F332 Major depressive disorder, recurrent severe without psychotic features: Secondary | ICD-10-CM | POA: Diagnosis not present

## 2020-08-02 ENCOUNTER — Encounter: Payer: Medicare Other | Admitting: Registered Nurse

## 2020-08-08 DIAGNOSIS — F332 Major depressive disorder, recurrent severe without psychotic features: Secondary | ICD-10-CM | POA: Diagnosis not present

## 2020-08-08 DIAGNOSIS — F411 Generalized anxiety disorder: Secondary | ICD-10-CM | POA: Diagnosis not present

## 2020-08-12 ENCOUNTER — Encounter: Payer: Medicare Other | Attending: Physical Medicine & Rehabilitation | Admitting: Registered Nurse

## 2020-08-12 ENCOUNTER — Other Ambulatory Visit
Admission: RE | Admit: 2020-08-12 | Discharge: 2020-08-12 | Disposition: A | Discharge status: Home / Self Care | Payer: Medicare Other | Source: Ambulatory Visit | Attending: Internal Medicine | Admitting: Internal Medicine

## 2020-08-12 ENCOUNTER — Encounter: Payer: Self-pay | Admitting: Internal Medicine

## 2020-08-12 ENCOUNTER — Ambulatory Visit (HOSPITAL_BASED_OUTPATIENT_CLINIC_OR_DEPARTMENT_OTHER): Payer: Medicare Other | Admitting: Internal Medicine

## 2020-08-12 ENCOUNTER — Encounter: Payer: Self-pay | Admitting: Registered Nurse

## 2020-08-12 ENCOUNTER — Other Ambulatory Visit: Payer: Self-pay

## 2020-08-12 VITALS — BP 111/74 | HR 88 | Temp 98.5°F | Ht 65.0 in | Wt 210.0 lb

## 2020-08-12 VITALS — BP 100/69 | HR 84 | Temp 98.7°F | Ht 65.0 in | Wt 209.0 lb

## 2020-08-12 DIAGNOSIS — Z5181 Encounter for therapeutic drug level monitoring: Secondary | ICD-10-CM | POA: Diagnosis not present

## 2020-08-12 DIAGNOSIS — N921 Excessive and frequent menstruation with irregular cycle: Secondary | ICD-10-CM | POA: Insufficient documentation

## 2020-08-12 DIAGNOSIS — Z79891 Long term (current) use of opiate analgesic: Secondary | ICD-10-CM | POA: Insufficient documentation

## 2020-08-12 DIAGNOSIS — F068 Other specified mental disorders due to known physiological condition: Secondary | ICD-10-CM | POA: Insufficient documentation

## 2020-08-12 DIAGNOSIS — Z114 Encounter for screening for human immunodeficiency virus [HIV]: Secondary | ICD-10-CM | POA: Insufficient documentation

## 2020-08-12 DIAGNOSIS — Z86018 Personal history of other benign neoplasm: Secondary | ICD-10-CM | POA: Diagnosis not present

## 2020-08-12 DIAGNOSIS — Z124 Encounter for screening for malignant neoplasm of cervix: Secondary | ICD-10-CM | POA: Insufficient documentation

## 2020-08-12 DIAGNOSIS — G894 Chronic pain syndrome: Secondary | ICD-10-CM | POA: Insufficient documentation

## 2020-08-12 DIAGNOSIS — S82891S Other fracture of right lower leg, sequela: Secondary | ICD-10-CM | POA: Diagnosis not present

## 2020-08-12 DIAGNOSIS — Z1159 Encounter for screening for other viral diseases: Secondary | ICD-10-CM

## 2020-08-12 DIAGNOSIS — Z113 Encounter for screening for infections with a predominantly sexual mode of transmission: Secondary | ICD-10-CM | POA: Diagnosis not present

## 2020-08-12 DIAGNOSIS — S82892S Other fracture of left lower leg, sequela: Secondary | ICD-10-CM | POA: Insufficient documentation

## 2020-08-12 DIAGNOSIS — S069X0S Unspecified intracranial injury without loss of consciousness, sequela: Secondary | ICD-10-CM | POA: Diagnosis not present

## 2020-08-12 DIAGNOSIS — D500 Iron deficiency anemia secondary to blood loss (chronic): Secondary | ICD-10-CM | POA: Diagnosis not present

## 2020-08-12 MED ORDER — OXYCODONE HCL 10 MG PO TABS: 10.0000 mg | ORAL_TABLET | Freq: Two times a day (BID) | ORAL | 0 refills | Status: DC | PRN

## 2020-08-12 NOTE — Patient Instructions (Signed)
I have referred you to a gynecologist. We have ordered a pelvic ultrasound to further evaluate your heavy menstrual cycles associated with anemia. Please keep a calendar dates of your menstrual cycles of when they begin and end. Take the iron supplement daily as prescribed.
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
## 2020-08-12 NOTE — Progress Notes (Signed)
Subjective:    Patient ID: Monique [name], female    DOB: [birth year], 34 y.o.   MRN: [id number]  HPI: Monique [name] is a 34 y.o. female who returns for follow up appointment for chronic pain and medication refill. She states her  pain is located in her bilateral feet. She rates her pain 5. Her current exercise regime is walking and performing stretching exercises.  Ms. Marxen Morphine equivalent is 30.00  MME.  Oral Swab was Performed Today.   Pain Inventory Average Pain 7 Pain Right Now 5 My pain is constant, sharp, stabbing, tingling and aching  In the last 24 hours, has pain interfered with the following? General activity 7 Relation with others 7 Enjoyment of life 7 What TIME of day is your pain at its worst? morning , daytime, evening and night Sleep (in general) Poor  Pain is worse with: walking, bending, standing and some activites Pain improves with: rest, heat/ice, pacing activities and medication Relief from Meds: 9  Family History  Problem Relation Age of Onset  . Diabetes Father   . Cancer Paternal Grandmother        Ovarian   Social History   Socioeconomic History  . Marital status: Single    Spouse name: Not on file  . Number of children: 3  . Years of education: Not on file  . Highest education level: Some college, no degree  Occupational History  . Occupation: Disability  Tobacco Use  . Smoking status: Passive Smoke Exposure - Never Smoker  . Smokeless tobacco: Never Used  Vaping Use  . Vaping Use: Never used  Substance and Sexual Activity  . Alcohol use: Yes    Comment: occasional  . Drug use: No  . Sexual activity: Yes  Other Topics Concern  . Not on file  Social History Narrative  . Not on file   Social Determinants of Health   Financial Resource Strain:   . Difficulty of Paying Living Expenses: Not on file  Food Insecurity:   . Worried About Charity fundraiser in the Last Year: Not on file  . Ran Out of Food in the Last  Year: Not on file  Transportation Needs:   . Lack of Transportation (Medical): Not on file  . Lack of Transportation (Non-Medical): Not on file  Physical Activity:   . Days of Exercise per Week: Not on file  . Minutes of Exercise per Session: Not on file  Stress:   . Feeling of Stress : Not on file  Social Connections:   . Frequency of Communication with Friends and Family: Not on file  . Frequency of Social Gatherings with Friends and Family: Not on file  . Attends Religious Services: Not on file  . Active Member of Clubs or Organizations: Not on file  . Attends Archivist Meetings: Not on file  . Marital Status: Not on file   Past Surgical History:  Procedure Laterality Date  . APPENDECTOMY    . Ceasarean section  2007, 2010,2014  . EXTERNAL FIXATION LEG Bilateral 12/14/2012   Procedure: EXTERNAL FIXATION LEG;  Surgeon: [name], MD;  Location: [place];  Service: Orthopedics;  Laterality: Bilateral;  . EXTERNAL FIXATION REMOVAL Bilateral 12/23/2012   Procedure: REMOVAL EXTERNAL FIXATION LEG;  Surgeon: [name], MD;  Location: [place];  Service: Orthopedics;  Laterality: Bilateral;  . I & D EXTREMITY Left 12/14/2012   Procedure: IRRIGATION AND DEBRIDEMENT EXTREMITY;  Surgeon: [name], MD;  Location:  [place] [facility];  Service: Orthopedics;  Laterality: Left;  . ORIF ANKLE FRACTURE Left 12/14/2012   Procedure: OPEN REDUCTION INTERNAL FIXATION (ORIF) ANKLE FRACTURE;  Surgeon: [name], MD;  Location: [place];  Service: Orthopedics;  Laterality: Left;  . ORIF TIBIA FRACTURE Bilateral 12/23/2012   Procedure: OPEN REDUCTION INTERNAL FIXATION (ORIF) BILATERAL DISTAL TIBIA FRACTURE;  Surgeon: [name], MD;  Location: [place];  Service: Orthopedics;  Laterality: Bilateral;   Past Surgical History:  Procedure Laterality Date  . APPENDECTOMY    . Ceasarean section  2007, 2010,2014  . EXTERNAL FIXATION LEG Bilateral 12/14/2012   Procedure: EXTERNAL FIXATION LEG;  Surgeon:  [name], MD;  Location: [place];  Service: Orthopedics;  Laterality: Bilateral;  . EXTERNAL FIXATION REMOVAL Bilateral 12/23/2012   Procedure: REMOVAL EXTERNAL FIXATION LEG;  Surgeon: [name], MD;  Location: [place];  Service: Orthopedics;  Laterality: Bilateral;  . I & D EXTREMITY Left 12/14/2012   Procedure: IRRIGATION AND DEBRIDEMENT EXTREMITY;  Surgeon: [name], MD;  Location: [place];  Service: Orthopedics;  Laterality: Left;  . ORIF ANKLE FRACTURE Left 12/14/2012   Procedure: OPEN REDUCTION INTERNAL FIXATION (ORIF) ANKLE FRACTURE;  Surgeon: [name], MD;  Location: [place];  Service: Orthopedics;  Laterality: Left;  . ORIF TIBIA FRACTURE Bilateral 12/23/2012   Procedure: OPEN REDUCTION INTERNAL FIXATION (ORIF) BILATERAL DISTAL TIBIA FRACTURE;  Surgeon: [name], MD;  Location: [place];  Service: Orthopedics;  Laterality: Bilateral;   Past Medical History:  Diagnosis Date  . Anemia   . Anxiety   . Depression   . Heart murmur    BP 111/74   Pulse 88   Temp 98.5 F (36.9 C)   Ht 5\' 5"  (1.651 m)   Wt 210 lb (95.3 kg)   SpO2 97%   BMI 34.95 kg/m   Opioid Risk Score:   Fall Risk Score:  `1  Depression screen PHQ 2/9  Depression screen [facility] 2/9 08/12/2020 06/02/2020 04/26/2020 04/27/2019 11/05/2018 11/11/2017 07/15/2017  Decreased Interest 1 2 0 0 1 0 0  Down, Depressed, Hopeless 0 0 0 0 1 0 0  PHQ - 2 Score 1 2 0 0 2 0 0  Altered sleeping 3 3 - - - - -  Tired, decreased energy 2 3 - - - - -  Change in appetite 2 1 - - - - -  Feeling bad or failure about yourself  0 1 - - - - -  Trouble concentrating 1 2 - - - - -  Moving slowly or fidgety/restless 0 0 - - - - -  Suicidal thoughts 0 0 - - - - -  PHQ-9 Score 9 12 - - - - -   Review of Systems  Musculoskeletal: Positive for gait problem.       Ankle & feet pain  All other systems reviewed and are negative.      Objective:   Physical Exam Vitals and nursing note reviewed.  Constitutional:      Appearance:  Normal appearance.  Cardiovascular:     Rate and Rhythm: Normal rate and regular rhythm.     Pulses: Normal pulses.     Heart sounds: Normal heart sounds.  Pulmonary:     Effort: Pulmonary effort is normal.     Breath sounds: Normal breath sounds.  Musculoskeletal:     Cervical back: Normal range of motion and neck supple.     Comments: Normal Muscle Bulk and Muscle  Testing Reveals:  Upper Extremities: Full ROM and Muscle Strength 5/5 Lower Extremities: Full ROM and Muscle Strength 5/5 Arises from chair with ease Narrow Based Gait   Skin:    General: Skin is warm and dry.  Neurological:     Mental Status: She is alert and oriented to person, place, and time.  Psychiatric:        Mood and Affect: Mood normal.        Behavior: Behavior normal.           Assessment & Plan:  1.TBI:Cognitive deficit:Continue to Monitor.08/12/2020. Continue with current treatment:using the various devices to help with your Organization such as notes, calendars and using her cell phone to set reminders. 2. Chronic Pain Syndrome:Continue with  slow weaning of Oxycodone.  Continue:Tablets decreased: Oxycodone 10 mg1tablet BIDas needed as needed # 12.  We will continue the opioid monitoring program, this consists of regular clinic visits, examinations, urine drug screen, pill counts as well as use of [place] Controlled Substance Reporting system. A 12 month History has been reviewed on the [place] on 08/12/2020. 3. Bilateral Pilon Fractures: Continueto Monitor.08/12/2020 4. Tibial Nerve Injuries: Continue to monitor.08/12/2020 5. Migraine:No complaints today.Continue :NaproxenPRN:Continue to monitor. 08/12/2020  F/U Visit in 2 months  43minutes of face to face patient care time was spent during this visit. All questions were encouraged and answered.

## 2020-08-13 LAB — HIV ANTIBODY (ROUTINE TESTING W REFLEX): HIV Screen 4th Generation wRfx: NONREACTIVE

## 2020-08-13 LAB — HEPATITIS C ANTIBODY: Hep C Virus Ab: <0.1 s/co ratio (ref 0.0–0.9)

## 2020-08-13 LAB — RPR: RPR Ser Ql: NONREACTIVE

## 2020-08-13 NOTE — Progress Notes (Signed)
Let patient know that screening tests for hepatitis C, HIV and syphilis are negative.

## 2020-08-15 ENCOUNTER — Encounter: Payer: Self-pay | Admitting: Registered Nurse

## 2020-08-15 ENCOUNTER — Other Ambulatory Visit: Payer: Self-pay | Admitting: Internal Medicine

## 2020-08-15 DIAGNOSIS — F332 Major depressive disorder, recurrent severe without psychotic features: Secondary | ICD-10-CM | POA: Diagnosis not present

## 2020-08-15 DIAGNOSIS — F411 Generalized anxiety disorder: Secondary | ICD-10-CM | POA: Diagnosis not present

## 2020-08-15 LAB — CERVICOVAGINAL ANCILLARY ONLY
Bacterial Vaginitis (gardnerella): POSITIVE — AB
Candida Glabrata: NEGATIVE
Candida Vaginitis: NEGATIVE
Chlamydia: NEGATIVE
Comment: NEGATIVE
Comment: NEGATIVE
Comment: NEGATIVE
Comment: NEGATIVE
Comment: NEGATIVE
Comment: NORMAL
Neisseria Gonorrhea: NEGATIVE
Trichomonas: NEGATIVE

## 2020-08-15 LAB — CYTOLOGY - PAP
Adequacy: ABSENT
Comment: NEGATIVE
Diagnosis: NEGATIVE
High risk HPV: NEGATIVE

## 2020-08-15 MED ORDER — METRONIDAZOLE 500 MG PO TABS: 500.0000 mg | ORAL_TABLET | Freq: Two times a day (BID) | ORAL | 0 refills | Status: DC

## 2020-08-17 ENCOUNTER — Telehealth: Payer: Self-pay

## 2020-08-17 NOTE — Telephone Encounter (Signed)
Contacted pt to go over lab results pt is aware

## 2020-08-19 LAB — DRUG TOX MONITOR 1 W/CONF, ORAL FLD
Amphetamines: NEGATIVE ng/mL (ref <10)
Barbiturates: NEGATIVE ng/mL (ref <10)
Benzodiazepines: NEGATIVE ng/mL (ref <0.50)
Buprenorphine: NEGATIVE ng/mL (ref <0.10)
Buprenorphine: NEGATIVE ng/mL (ref <0.10)
Cocaine: NEGATIVE ng/mL (ref <5.0)
Codeine: NEGATIVE ng/mL (ref <2.5)
Dihydrocodeine: NEGATIVE ng/mL (ref <2.5)
Fentanyl: NEGATIVE ng/mL (ref <0.10)
Heroin Metabolite: NEGATIVE ng/mL (ref <1.0)
Hydrocodone: NEGATIVE ng/mL (ref <2.5)
Hydromorphone: NEGATIVE ng/mL (ref <2.5)
MARIJUANA: NEGATIVE ng/mL (ref <2.5)
MDMA: NEGATIVE ng/mL (ref <10)
Meprobamate: NEGATIVE ng/mL (ref <2.5)
Methadone: NEGATIVE ng/mL (ref <5.0)
Morphine: NEGATIVE ng/mL (ref <2.5)
Naloxone: NEGATIVE ng/mL (ref <0.25)
Nicotine Metabolite: NEGATIVE ng/mL (ref <5.0)
Norbuprenorphine: NEGATIVE ng/mL (ref <0.50)
Norhydrocodone: NEGATIVE ng/mL (ref <2.5)
Noroxycodone: NEGATIVE ng/mL (ref <2.5)
Opiates: POSITIVE ng/mL — AB (ref <2.5)
Oxycodone: 14.8 ng/mL — ABNORMAL HIGH (ref <2.5)
Oxymorphone: NEGATIVE ng/mL (ref <2.5)
Phencyclidine: NEGATIVE ng/mL (ref <10)
Tapentadol: NEGATIVE ng/mL (ref <5.0)
Tramadol: NEGATIVE ng/mL (ref <5.0)
Zolpidem: NEGATIVE ng/mL (ref <5.0)

## 2020-08-19 LAB — DRUG TOX ALC METAB W/CON, ORAL FLD: Alcohol Metabolite: NEGATIVE ng/mL (ref <25)

## 2020-08-22 DIAGNOSIS — F332 Major depressive disorder, recurrent severe without psychotic features: Secondary | ICD-10-CM | POA: Diagnosis not present

## 2020-08-22 DIAGNOSIS — F411 Generalized anxiety disorder: Secondary | ICD-10-CM | POA: Diagnosis not present

## 2020-08-24 DIAGNOSIS — F332 Major depressive disorder, recurrent severe without psychotic features: Secondary | ICD-10-CM | POA: Diagnosis not present

## 2020-08-24 DIAGNOSIS — F411 Generalized anxiety disorder: Secondary | ICD-10-CM | POA: Diagnosis not present

## 2020-08-25 ENCOUNTER — Telehealth: Payer: Self-pay | Admitting: *Deleted

## 2020-08-25 NOTE — Telephone Encounter (Signed)
Oral swab drug screen was consistent for prescribed medications.  ?

## 2020-08-29 ENCOUNTER — Other Ambulatory Visit: Payer: Self-pay

## 2020-08-29 ENCOUNTER — Other Ambulatory Visit: Payer: Self-pay | Admitting: Internal Medicine

## 2020-08-29 ENCOUNTER — Ambulatory Visit (HOSPITAL_COMMUNITY)
Admission: RE | Admit: 2020-08-29 | Discharge: 2020-08-29 | Disposition: A | Discharge status: Home / Self Care | Payer: Medicare Other | Source: Ambulatory Visit | Attending: Internal Medicine | Admitting: Internal Medicine

## 2020-08-29 DIAGNOSIS — D500 Iron deficiency anemia secondary to blood loss (chronic): Secondary | ICD-10-CM | POA: Insufficient documentation

## 2020-08-29 DIAGNOSIS — F332 Major depressive disorder, recurrent severe without psychotic features: Secondary | ICD-10-CM | POA: Diagnosis not present

## 2020-08-29 DIAGNOSIS — N921 Excessive and frequent menstruation with irregular cycle: Secondary | ICD-10-CM

## 2020-08-29 DIAGNOSIS — F411 Generalized anxiety disorder: Secondary | ICD-10-CM | POA: Diagnosis not present

## 2020-08-29 DIAGNOSIS — N920 Excessive and frequent menstruation with regular cycle: Secondary | ICD-10-CM | POA: Diagnosis not present

## 2020-08-29 DIAGNOSIS — Z86018 Personal history of other benign neoplasm: Secondary | ICD-10-CM | POA: Diagnosis not present

## 2020-08-29 DIAGNOSIS — N854 Malposition of uterus: Secondary | ICD-10-CM | POA: Diagnosis not present

## 2020-08-29 DIAGNOSIS — D219 Benign neoplasm of connective and other soft tissue, unspecified: Secondary | ICD-10-CM

## 2020-08-29 DIAGNOSIS — D252 Subserosal leiomyoma of uterus: Secondary | ICD-10-CM | POA: Diagnosis not present

## 2020-08-30 ENCOUNTER — Telehealth: Payer: Self-pay

## 2020-08-30 NOTE — Telephone Encounter (Signed)
Contacted pt to go over ultrasound results pt is aware and doesn't have any questions or concerns

## 2020-09-05 DIAGNOSIS — F332 Major depressive disorder, recurrent severe without psychotic features: Secondary | ICD-10-CM | POA: Diagnosis not present

## 2020-09-05 DIAGNOSIS — F411 Generalized anxiety disorder: Secondary | ICD-10-CM | POA: Diagnosis not present

## 2020-09-06 DIAGNOSIS — L700 Acne vulgaris: Secondary | ICD-10-CM | POA: Diagnosis not present

## 2020-09-06 DIAGNOSIS — L910 Hypertrophic scar: Secondary | ICD-10-CM | POA: Diagnosis not present

## 2020-09-12 DIAGNOSIS — F332 Major depressive disorder, recurrent severe without psychotic features: Secondary | ICD-10-CM | POA: Diagnosis not present

## 2020-09-12 DIAGNOSIS — F411 Generalized anxiety disorder: Secondary | ICD-10-CM | POA: Diagnosis not present

## 2020-09-19 DIAGNOSIS — F411 Generalized anxiety disorder: Secondary | ICD-10-CM | POA: Diagnosis not present

## 2020-09-19 DIAGNOSIS — F332 Major depressive disorder, recurrent severe without psychotic features: Secondary | ICD-10-CM | POA: Diagnosis not present

## 2020-09-26 DIAGNOSIS — F332 Major depressive disorder, recurrent severe without psychotic features: Secondary | ICD-10-CM | POA: Diagnosis not present

## 2020-09-26 DIAGNOSIS — F411 Generalized anxiety disorder: Secondary | ICD-10-CM | POA: Diagnosis not present

## 2020-09-30 DIAGNOSIS — U071 COVID-19: Secondary | ICD-10-CM | POA: Diagnosis not present

## 2020-10-03 DIAGNOSIS — F411 Generalized anxiety disorder: Secondary | ICD-10-CM | POA: Diagnosis not present

## 2020-10-03 DIAGNOSIS — F332 Major depressive disorder, recurrent severe without psychotic features: Secondary | ICD-10-CM | POA: Diagnosis not present

## 2020-10-10 DIAGNOSIS — F411 Generalized anxiety disorder: Secondary | ICD-10-CM | POA: Diagnosis not present

## 2020-10-10 DIAGNOSIS — F332 Major depressive disorder, recurrent severe without psychotic features: Secondary | ICD-10-CM | POA: Diagnosis not present

## 2020-10-12 ENCOUNTER — Emergency Department (HOSPITAL_BASED_OUTPATIENT_CLINIC_OR_DEPARTMENT_OTHER): Payer: Medicare Other

## 2020-10-12 ENCOUNTER — Emergency Department (HOSPITAL_BASED_OUTPATIENT_CLINIC_OR_DEPARTMENT_OTHER)
Admission: EM | Admit: 2020-10-12 | Discharge: 2020-10-12 | Disposition: A | Discharge status: Home / Self Care | Payer: Medicare Other | Attending: Emergency Medicine | Admitting: Emergency Medicine

## 2020-10-12 ENCOUNTER — Other Ambulatory Visit: Payer: Self-pay

## 2020-10-12 ENCOUNTER — Encounter (HOSPITAL_BASED_OUTPATIENT_CLINIC_OR_DEPARTMENT_OTHER): Payer: Self-pay | Admitting: Emergency Medicine

## 2020-10-12 DIAGNOSIS — Z9049 Acquired absence of other specified parts of digestive tract: Secondary | ICD-10-CM | POA: Diagnosis not present

## 2020-10-12 DIAGNOSIS — R1031 Right lower quadrant pain: Secondary | ICD-10-CM | POA: Diagnosis present

## 2020-10-12 DIAGNOSIS — R110 Nausea: Secondary | ICD-10-CM | POA: Diagnosis not present

## 2020-10-12 DIAGNOSIS — R109 Unspecified abdominal pain: Secondary | ICD-10-CM | POA: Diagnosis not present

## 2020-10-12 DIAGNOSIS — Z7722 Contact with and (suspected) exposure to environmental tobacco smoke (acute) (chronic): Secondary | ICD-10-CM | POA: Insufficient documentation

## 2020-10-12 DIAGNOSIS — N83201 Unspecified ovarian cyst, right side: Secondary | ICD-10-CM | POA: Diagnosis not present

## 2020-10-12 DIAGNOSIS — R9431 Abnormal electrocardiogram [ECG] [EKG]: Secondary | ICD-10-CM | POA: Diagnosis not present

## 2020-10-12 HISTORY — DX: Unspecified intracranial injury without loss of consciousness, sequela: S06.9X0S

## 2020-10-12 HISTORY — DX: Other specified mental disorders due to known physiological condition: F06.8

## 2020-10-12 HISTORY — DX: Unspecified intracranial injury with loss of consciousness of unspecified duration, initial encounter: S06.9X9A

## 2020-10-12 HISTORY — DX: Unspecified intracranial injury with loss of consciousness status unknown, initial encounter: S06.9XAA

## 2020-10-12 HISTORY — DX: Other symptoms and signs involving cognitive functions and awareness: R41.89

## 2020-10-12 HISTORY — DX: Excessive and frequent menstruation with regular cycle: N92.0

## 2020-10-12 HISTORY — DX: Chronic pain syndrome: G89.4

## 2020-10-12 LAB — URINALYSIS, ROUTINE W REFLEX MICROSCOPIC
Bilirubin Urine: NEGATIVE
Glucose, UA: NEGATIVE mg/dL
Hgb urine dipstick: NEGATIVE
Ketones, ur: NEGATIVE mg/dL
Leukocytes,Ua: NEGATIVE
Nitrite: NEGATIVE
Protein, ur: NEGATIVE mg/dL
Specific Gravity, Urine: 1.03 (ref 1.005–1.030)
pH: 5 (ref 5.0–8.0)

## 2020-10-12 LAB — COMPREHENSIVE METABOLIC PANEL
ALT: 12 U/L (ref 0–44)
AST: 19 U/L (ref 15–41)
Albumin: 4.3 g/dL (ref 3.5–5.0)
Alkaline Phosphatase: 55 U/L (ref 38–126)
Anion gap: 11 (ref 5–15)
BUN: 11 mg/dL (ref 6–20)
CO2: 24 mmol/L (ref 22–32)
Calcium: 9.4 mg/dL (ref 8.9–10.3)
Chloride: 100 mmol/L (ref 98–111)
Creatinine, Ser: 0.79 mg/dL (ref 0.44–1.00)
GFR, Estimated: >60 mL/min (ref >60)
Glucose, Bld: 117 mg/dL — ABNORMAL HIGH (ref 70–99)
Potassium: 4 mmol/L (ref 3.5–5.1)
Sodium: 135 mmol/L (ref 135–145)
Total Bilirubin: 0.5 mg/dL (ref 0.3–1.2)
Total Protein: 8.2 g/dL — ABNORMAL HIGH (ref 6.5–8.1)

## 2020-10-12 LAB — CBC
HCT: 36.5 % (ref 36.0–46.0)
Hemoglobin: 11.3 g/dL — ABNORMAL LOW (ref 12.0–15.0)
MCH: 23.2 pg — ABNORMAL LOW (ref 26.0–34.0)
MCHC: 31 g/dL (ref 30.0–36.0)
MCV: 74.8 fL — ABNORMAL LOW (ref 80.0–100.0)
Platelets: 377 10*3/uL (ref 150–400)
RBC: 4.88 MIL/uL (ref 3.87–5.11)
RDW: 16 % — ABNORMAL HIGH (ref 11.5–15.5)
WBC: 10.2 10*3/uL (ref 4.0–10.5)
nRBC: 0 % (ref 0.0–0.2)

## 2020-10-12 LAB — LIPASE, BLOOD: Lipase: 29 U/L (ref 11–51)

## 2020-10-12 LAB — PREGNANCY, URINE: Preg Test, Ur: NEGATIVE

## 2020-10-12 MED ORDER — SODIUM CHLORIDE 0.9 % IV BOLUS
500.0000 mL | Freq: Once | INTRAVENOUS | Status: AC
  Administered 2020-10-12: 500 mL via INTRAVENOUS

## 2020-10-12 MED ORDER — IOHEXOL 300 MG/ML  SOLN
100.0000 mL | Freq: Once | INTRAMUSCULAR | Status: AC | PRN
  Administered 2020-10-12: 100 mL via INTRAVENOUS

## 2020-10-12 MED ORDER — HYDROMORPHONE HCL 1 MG/ML IJ SOLN
0.5000 mg | Freq: Once | INTRAMUSCULAR | Status: AC
  Administered 2020-10-12: 0.5 mg via INTRAVENOUS
  Filled 2020-10-12: qty 1

## 2020-10-12 MED ORDER — NAPROXEN 500 MG PO TABS: 500.0000 mg | ORAL_TABLET | Freq: Two times a day (BID) | ORAL | 0 refills | Status: DC

## 2020-10-12 MED ORDER — ONDANSETRON HCL 4 MG/2ML IJ SOLN
4.0000 mg | Freq: Once | INTRAMUSCULAR | Status: AC
  Administered 2020-10-12: 4 mg via INTRAVENOUS
  Filled 2020-10-12: qty 2

## 2020-10-12 NOTE — ED Notes (Signed)
Patient states mother here to pick her up after administration of IV narcotic pain medicine. Explained to patient narcotic driving policy and potential legal ramifications of patient decides to drive motor vehicle home.

## 2020-10-12 NOTE — ED Provider Notes (Signed)
[facility] Emergency Department Provider Note MRN:  [id number]  Arrival date & time: 10/12/20     Chief Complaint   Abdominal Pain   History of Present Illness   Monique [name] is a 35 y.o. year-old female with a history of chronic pain presenting to the ED with chief complaint of abdominal pain.  Location: Right lower quadrant Duration: 1 or 2 days Onset: Gradual Timing: Constant Description: Dull Severity: Moderate Exacerbating/Alleviating Factors: Worse when laying still Associated Symptoms: Nausea, vaginal bleeding Pertinent Negatives: Denies fever, no cough, no chest pain, no shortness of breath   Review of Systems  A complete 10 system review of systems was obtained and all systems are negative except as noted in the HPI and PMH.   Patient's Health History    Past Medical History:  Diagnosis Date  . Anemia   . Anxiety   . Chronic pain syndrome   . Cognitive deficit as late effect of traumatic brain injury ([place])   . Depression   . Heart murmur   . Menorrhagia   . TBI (traumatic brain injury) [facility])     Past Surgical History:  Procedure Laterality Date  . APPENDECTOMY    . Ceasarean section  2007, 2010,2014  . EXTERNAL FIXATION LEG Bilateral 12/14/2012   Procedure: EXTERNAL FIXATION LEG;  Surgeon: [name], MD;  Location: [place];  Service: Orthopedics;  Laterality: Bilateral;  . EXTERNAL FIXATION REMOVAL Bilateral 12/23/2012   Procedure: REMOVAL EXTERNAL FIXATION LEG;  Surgeon: [name], MD;  Location: [place];  Service: Orthopedics;  Laterality: Bilateral;  . I & D EXTREMITY Left 12/14/2012   Procedure: IRRIGATION AND DEBRIDEMENT EXTREMITY;  Surgeon: [name], MD;  Location: [place];  Service: Orthopedics;  Laterality: Left;  . ORIF ANKLE FRACTURE Left 12/14/2012   Procedure: OPEN REDUCTION INTERNAL FIXATION (ORIF) ANKLE FRACTURE;  Surgeon: [name], MD;  Location: [place];  Service: Orthopedics;  Laterality: Left;   . ORIF TIBIA FRACTURE Bilateral 12/23/2012   Procedure: OPEN REDUCTION INTERNAL FIXATION (ORIF) BILATERAL DISTAL TIBIA FRACTURE;  Surgeon: [name], MD;  Location: [place];  Service: Orthopedics;  Laterality: Bilateral;    Family History  Problem Relation Age of Onset  . Diabetes Father   . Cancer Paternal Grandmother        Ovarian    Social History   Socioeconomic History  . Marital status: Single    Spouse name: Not on file  . Number of children: 3  . Years of education: Not on file  . Highest education level: Some college, no degree  Occupational History  . Occupation: Disability  Tobacco Use  . Smoking status: Passive Smoke Exposure - Never Smoker  . Smokeless tobacco: Never Used  Vaping Use  . Vaping Use: Never used  Substance and Sexual Activity  . Alcohol use: Yes    Comment: occasional  . Drug use: No  . Sexual activity: Yes  Other Topics Concern  . Not on file  Social History Narrative  . Not on file   Social Determinants of Health   Financial Resource Strain: Not on file  Food Insecurity: Not on file  Transportation Needs: Not on file  Physical Activity: Not on file  Stress: Not on file  Social Connections: Not on file  Intimate Partner Violence: Not on file     Physical Exam   Vitals:   10/12/20 0315 10/12/20 0525  BP: 118/62 116/72  Pulse: 88 78  Resp:  17 18  Temp: 98.5 F (36.9 C) 98.5 F (36.9 C)  SpO2: 99% 100%    CONSTITUTIONAL: Well-appearing, NAD NEURO:  Alert and oriented x 3, no focal deficits EYES:  eyes equal and reactive ENT/NECK:  no LAD, no JVD CARDIO: Regular rate, well-perfused, normal S1 and S2 PULM:  CTAB no wheezing or rhonchi GI/GU:  normal bowel sounds, non-distended, non-tender MSK/SPINE:  No gross deformities, no edema SKIN:  no rash, atraumatic PSYCH:  Appropriate speech and behavior  *Additional and/or pertinent findings included in MDM below  Diagnostic and Interventional Summary    EKG  Interpretation  Date/Time:  Wednesday October 12 2020 00:53:42 EST Ventricular Rate:  85 PR Interval:  146 QRS Duration: 82 QT Interval:  356 QTC Calculation: 423 R Axis:   14 Text Interpretation: Normal sinus rhythm Minimal voltage criteria for LVH, may be normal variant ( R in aVL ) ST elevation, consider early repolarization, pericarditis, or injury Abnormal ECG Confirmed by Kennis Carina [phone]) on 10/12/2020 4:40:54 AM      Labs Reviewed  CBC - Abnormal; Notable for the following components:      Result Value   Hemoglobin 11.3 (*)    MCV 74.8 (*)    MCH 23.2 (*)    RDW 16.0 (*)    All other components within normal limits  COMPREHENSIVE METABOLIC PANEL - Abnormal; Notable for the following components:   Glucose, Bld 117 (*)    Total Protein 8.2 (*)    All other components within normal limits  URINALYSIS, ROUTINE W REFLEX MICROSCOPIC  PREGNANCY, URINE  LIPASE, BLOOD    CT ABDOMEN PELVIS W CONTRAST  Final Result      Medications  HYDROmorphone (DILAUDID) injection 0.5 mg (0.5 mg Intravenous Given 10/12/20 0502)  sodium chloride 0.9 % bolus 500 mL (500 mLs Intravenous New Bag/Given 10/12/20 0501)  ondansetron (ZOFRAN) injection 4 mg (4 mg Intravenous Given 10/12/20 0500)  iohexol (OMNIPAQUE) 300 MG/ML solution 100 mL (100 mLs Intravenous Contrast Given 10/12/20 2482)     Procedures  /  Critical Care Procedures  ED Course and Medical Decision Making  I have reviewed the triage vital signs, the nursing notes, and pertinent available records from the EMR.  Listed above are laboratory and imaging tests that I personally ordered, reviewed, and interpreted and then considered in my medical decision making (see below for details).  Considering appendicitis, ovarian cyst, fibroid related pain.  Doubt torsion.  CT pending.     CT confirms ovarian cyst.  Appropriate for discharge with GYN follow-up.  Elmer Sow. Pilar Plate, MD [facility] Emergency Medicine [facility]  Health [email or web address]  Final Clinical Impressions(s) / ED Diagnoses     ICD-10-CM   1. Cyst of right ovary  N83.201     ED Discharge Orders         Ordered    naproxen (NAPROSYN) 500 MG tablet  2 times daily        10/12/20 0551           Discharge Instructions Discussed with and Provided to Patient:     Discharge Instructions     You were evaluated in the Emergency Department and after careful evaluation, we did not find any emergent condition requiring admission or further testing in the hospital.  Your exam/testing today was overall reassuring.  Symptoms seem to be due to an ovarian cyst.  Recommend use of Naprosyn as directed and follow-up with your GYN doctor.  Please return to the Emergency  Department if you experience any worsening of your condition.  Thank you for allowing [place] to be a part of your care.        Maudie Flakes, MD 10/12/20 [phone]

## 2020-10-12 NOTE — ED Triage Notes (Signed)
Pt presents from home with RLQ pain as well and left back pain. Started uesterday and has gotten worse. States her "gas has changed." Some nausea.

## 2020-10-12 NOTE — Discharge Instructions (Addendum)
You were evaluated in the Emergency Department and after careful evaluation, we did not find any emergent condition requiring admission or further testing in the hospital.  Your exam/testing today was overall reassuring.  Symptoms seem to be due to an ovarian cyst.  Recommend use of Naprosyn as directed and follow-up with your GYN doctor.  Please return to the Emergency Department if you experience any worsening of your condition.  Thank you for allowing [place] to be a part of your care.

## 2020-10-13 ENCOUNTER — Encounter: Payer: Self-pay | Admitting: Registered Nurse

## 2020-10-13 ENCOUNTER — Other Ambulatory Visit: Payer: Self-pay

## 2020-10-13 ENCOUNTER — Encounter: Payer: Medicare Other | Attending: Physical Medicine & Rehabilitation | Admitting: Registered Nurse

## 2020-10-13 VITALS — BP 109/77 | HR 77 | Temp 97.9°F | Ht 65.0 in | Wt 208.4 lb

## 2020-10-13 DIAGNOSIS — G894 Chronic pain syndrome: Secondary | ICD-10-CM | POA: Diagnosis not present

## 2020-10-13 DIAGNOSIS — M79672 Pain in left foot: Secondary | ICD-10-CM

## 2020-10-13 DIAGNOSIS — S069X2S Unspecified intracranial injury with loss of consciousness of 31 minutes to 59 minutes, sequela: Secondary | ICD-10-CM | POA: Diagnosis not present

## 2020-10-13 DIAGNOSIS — M79604 Pain in right leg: Secondary | ICD-10-CM | POA: Diagnosis not present

## 2020-10-13 DIAGNOSIS — S069X0S Unspecified intracranial injury without loss of consciousness, sequela: Secondary | ICD-10-CM | POA: Insufficient documentation

## 2020-10-13 DIAGNOSIS — M79671 Pain in right foot: Secondary | ICD-10-CM

## 2020-10-13 DIAGNOSIS — S82892S Other fracture of left lower leg, sequela: Secondary | ICD-10-CM | POA: Insufficient documentation

## 2020-10-13 DIAGNOSIS — M79605 Pain in left leg: Secondary | ICD-10-CM | POA: Diagnosis not present

## 2020-10-13 DIAGNOSIS — R4189 Other symptoms and signs involving cognitive functions and awareness: Secondary | ICD-10-CM

## 2020-10-13 DIAGNOSIS — L700 Acne vulgaris: Secondary | ICD-10-CM | POA: Diagnosis not present

## 2020-10-13 DIAGNOSIS — L910 Hypertrophic scar: Secondary | ICD-10-CM | POA: Diagnosis not present

## 2020-10-13 DIAGNOSIS — F068 Other specified mental disorders due to known physiological condition: Secondary | ICD-10-CM | POA: Insufficient documentation

## 2020-10-13 DIAGNOSIS — S82891S Other fracture of right lower leg, sequela: Secondary | ICD-10-CM

## 2020-10-13 MED ORDER — OXYCODONE HCL 10 MG PO TABS: 10.0000 mg | ORAL_TABLET | Freq: Two times a day (BID) | ORAL | 0 refills | Status: DC | PRN

## 2020-10-13 NOTE — Patient Instructions (Addendum)
Here's the Information you have requested:   Dr Myrene Galas Performed Your Surgery on 12/23/2012  [address], [place], [place] [address] Phone: [phone]

## 2020-10-13 NOTE — Progress Notes (Signed)
Subjective:    Patient ID: Monique [name], female    DOB: 02/23/1986, 35 y.o.   MRN: [id number]  HPI: Monique RuthsDominique [name] is a 35 y.o. female who returns for follow up appointment for chronic pain and medication refill. She states her pain is located in her bilateral lower extremities and bilateral feet, also reports increase intensity of pain. We will order X-rays, she verbalizes understanding. Also reports right groin pain, [facility] on 10/12/20, she was diagnosed with Cyst of right ovary, she has a scheduled appointment with her gynecologist, she reports. ED note was reviewed. She rates her pain 7. Her current exercise regime is walking and performing stretching exercises.  Ms. Basilia JumboCovington  Morphine equivalent is 30.00 MME.    Last Oral Swab was Performed on 08/12/2020, it was consistent.    Pain Inventory Average Pain 8 Pain Right Now 7 My pain is constant, sharp, stabbing, tingling and aching  In the last 24 hours, has pain interfered with the following? General activity 7 Relation with others 7 Enjoyment of life 7 What TIME of day is your pain at its worst? evening and night Sleep (in general) Fair  Pain is worse with: walking, standing and some activites Pain improves with: rest, heat/ice, pacing activities and medication Relief from Meds: 9  Family History  Problem Relation Age of Onset  . Diabetes Father   . Cancer Paternal Grandmother        Ovarian   Social History   Socioeconomic History  . Marital status: Single    Spouse name: Not on file  . Number of children: 3  . Years of education: Not on file  . Highest education level: Some college, no degree  Occupational History  . Occupation: Disability  Tobacco Use  . Smoking status: Passive Smoke Exposure - Never Smoker  . Smokeless tobacco: Never Used  Vaping Use  . Vaping Use: Never used  Substance and Sexual Activity  . Alcohol use: Yes    Comment: occasional  .  Drug use: No  . Sexual activity: Yes  Other Topics Concern  . Not on file  Social History Narrative  . Not on file   Social Determinants of Health   Financial Resource Strain: Not on file  Food Insecurity: Not on file  Transportation Needs: Not on file  Physical Activity: Not on file  Stress: Not on file  Social Connections: Not on file   Past Surgical History:  Procedure Laterality Date  . APPENDECTOMY    . Ceasarean section  2007, 2010,2014  . EXTERNAL FIXATION LEG Bilateral 12/14/2012   Procedure: EXTERNAL FIXATION LEG;  Surgeon: [name], MD;  [facility];  Service: Orthopedics;  Laterality: Bilateral;  . EXTERNAL FIXATION REMOVAL Bilateral 12/23/2012   Procedure: REMOVAL EXTERNAL FIXATION LEG;  Surgeon: [name], MD;  [facility];  Service: Orthopedics;  Laterality: Bilateral;  . I & D EXTREMITY Left 12/14/2012   Procedure: IRRIGATION AND DEBRIDEMENT EXTREMITY;  Surgeon: [name], MD;  [facility];  Service: Orthopedics;  Laterality: Left;  . ORIF ANKLE FRACTURE Left 12/14/2012   Procedure: OPEN REDUCTION INTERNAL FIXATION (ORIF) ANKLE FRACTURE;  Surgeon: [name], MD;  [facility];  Service: Orthopedics;  Laterality: Left;  . ORIF TIBIA FRACTURE Bilateral 12/23/2012   Procedure: OPEN REDUCTION INTERNAL FIXATION (ORIF) BILATERAL DISTAL TIBIA FRACTURE;  Surgeon: [name], MD;  [facility];  Service: Orthopedics;  Laterality: Bilateral;  Past Surgical History:  Procedure Laterality Date  . APPENDECTOMY    . Ceasarean section  2007, 2010,2014  . EXTERNAL FIXATION LEG Bilateral 12/14/2012   Procedure: EXTERNAL FIXATION LEG;  Surgeon: [name], MD;  Location: [place];  Service: Orthopedics;  Laterality: Bilateral;  . EXTERNAL FIXATION REMOVAL Bilateral 12/23/2012   Procedure: REMOVAL EXTERNAL FIXATION LEG;  Surgeon: [name], MD;  Location: [place];  Service: Orthopedics;  Laterality: Bilateral;  . I & D EXTREMITY Left 12/14/2012    Procedure: IRRIGATION AND DEBRIDEMENT EXTREMITY;  Surgeon: [name], MD;  Location: [place];  Service: Orthopedics;  Laterality: Left;  . ORIF ANKLE FRACTURE Left 12/14/2012   Procedure: OPEN REDUCTION INTERNAL FIXATION (ORIF) ANKLE FRACTURE;  Surgeon: [name], MD;  Location: [place];  Service: Orthopedics;  Laterality: Left;  . ORIF TIBIA FRACTURE Bilateral 12/23/2012   Procedure: OPEN REDUCTION INTERNAL FIXATION (ORIF) BILATERAL DISTAL TIBIA FRACTURE;  Surgeon: [name], MD;  Location: [place];  Service: Orthopedics;  Laterality: Bilateral;   Past Medical History:  Diagnosis Date  . Anemia   . Anxiety   . Chronic pain syndrome   . Cognitive deficit as late effect of traumatic brain injury ([place])   . Depression   . Heart murmur   . Menorrhagia   . TBI (traumatic brain injury) ([place])    There were no vitals taken for this visit.  Opioid Risk Score:   Fall Risk Score:  `1  Depression screen PHQ 2/9  Depression screen [facility] 2/9 08/12/2020 08/12/2020 06/02/2020 04/26/2020 04/27/2019 11/05/2018 11/11/2017  Decreased Interest 0 1 2 0 0 1 0  Down, Depressed, Hopeless 0 0 0 0 0 1 0  PHQ - 2 Score 0 1 2 0 0 2 0  Altered sleeping - 3 3 - - - -  Tired, decreased energy - 2 3 - - - -  Change in appetite - 2 1 - - - -  Feeling bad or failure about yourself  - 0 1 - - - -  Trouble concentrating - 1 2 - - - -  Moving slowly or fidgety/restless - 0 0 - - - -  Suicidal thoughts - 0 0 - - - -  PHQ-9 Score - 9 12 - - - -   Review of Systems  Musculoskeletal: Positive for gait problem.       Both feet  All other systems reviewed and are negative.      Objective:   Physical Exam Vitals and nursing note reviewed.  Constitutional:      Appearance: Normal appearance.  Cardiovascular:     Rate and Rhythm: Normal rate and regular rhythm.     Pulses: Normal pulses.     Heart sounds: Normal heart sounds.  Pulmonary:     Effort: Pulmonary effort is normal.     Breath sounds: Normal  breath sounds.  Musculoskeletal:     Cervical back: Normal range of motion and neck supple.     Comments: Normal Muscle Bulk and Muscle Testing Reveals:  Upper Extremities: Full ROM and Muscle Strength 5/5  Lower Extremities: Full ROM and Muscle Strength 5/5 Bilateral Lower Extremities Flexion Produces Pain into his Bilateral Lower Extremities and Bilateral Foot Arises from chair with ease  Narrow Based Gait   Skin:    General: Skin is warm and dry.  Neurological:     Mental Status: She is alert and oriented to person, place, and time.  Psychiatric:  Mood and Affect: Mood normal.        Behavior: Behavior normal.           Assessment & Plan:  1.TBI:Cognitive deficit:Continue to Monitor.10/13/2020. Continue with current treatment:using the various devices to help with your Organization such as notes, calendars and using her cell phone to set reminders. 2. Chronic Pain Syndrome:Continue withslow weaning of Oxycodone.  Continue:Tablets decreased: Oxycodone 10 mg1tablet BIDas needed as needed #45. We will continue the opioid monitoring program, this consists of regular clinic visits, examinations, urine drug screen, pill counts as well as use of [place] Controlled Substance Reporting system. A 12 month History has been reviewed on the [place] Controlled Substance Reporting Systemon 10/13/2020. 3. Bilateral Pilon Fractures: Continueto Monitor.10/13/2020 4. Tibial Nerve Injuries: Continue to monitor.10/13/2020 5. Migraine:No complaints today.Continue :NaproxenPRN:Continue to monitor. 10/13/2020 6 Bilateral lower extremity Pain: RX: Xrays 7. Bilateral Foot pain: RX: X-Rays  F/U Visit in 2 months

## 2020-10-17 DIAGNOSIS — F411 Generalized anxiety disorder: Secondary | ICD-10-CM | POA: Diagnosis not present

## 2020-10-17 DIAGNOSIS — F332 Major depressive disorder, recurrent severe without psychotic features: Secondary | ICD-10-CM | POA: Diagnosis not present

## 2020-10-19 ENCOUNTER — Ambulatory Visit
Admission: RE | Admit: 2020-10-19 | Discharge: 2020-10-19 | Disposition: A | Discharge status: Home / Self Care | Payer: Medicare Other | Source: Ambulatory Visit | Attending: Registered Nurse | Admitting: Registered Nurse

## 2020-10-19 ENCOUNTER — Encounter: Payer: Self-pay | Admitting: Obstetrics and Gynecology

## 2020-10-19 ENCOUNTER — Ambulatory Visit (INDEPENDENT_AMBULATORY_CARE_PROVIDER_SITE_OTHER): Payer: Medicare Other | Admitting: Obstetrics and Gynecology

## 2020-10-19 ENCOUNTER — Other Ambulatory Visit: Payer: Self-pay

## 2020-10-19 VITALS — BP 119/81 | HR 85 | Ht 65.0 in | Wt 207.3 lb

## 2020-10-19 DIAGNOSIS — M7989 Other specified soft tissue disorders: Secondary | ICD-10-CM | POA: Diagnosis not present

## 2020-10-19 DIAGNOSIS — M79661 Pain in right lower leg: Secondary | ICD-10-CM | POA: Diagnosis not present

## 2020-10-19 DIAGNOSIS — N921 Excessive and frequent menstruation with irregular cycle: Secondary | ICD-10-CM

## 2020-10-19 DIAGNOSIS — D219 Benign neoplasm of connective and other soft tissue, unspecified: Secondary | ICD-10-CM | POA: Insufficient documentation

## 2020-10-19 DIAGNOSIS — M19071 Primary osteoarthritis, right ankle and foot: Secondary | ICD-10-CM | POA: Diagnosis not present

## 2020-10-19 DIAGNOSIS — M79604 Pain in right leg: Secondary | ICD-10-CM | POA: Diagnosis not present

## 2020-10-19 DIAGNOSIS — M19072 Primary osteoarthritis, left ankle and foot: Secondary | ICD-10-CM | POA: Diagnosis not present

## 2020-10-19 DIAGNOSIS — M79605 Pain in left leg: Secondary | ICD-10-CM | POA: Diagnosis not present

## 2020-10-19 MED ORDER — NORETHIN ACE-ETH ESTRAD-FE 1-20 MG-MCG(24) PO TABS: 1.0000 | ORAL_TABLET | Freq: Every day | ORAL | 11 refills | Status: DC

## 2020-10-19 NOTE — Progress Notes (Signed)
Monique [name] presents for eval of her uterine fibroid and heavy cycles. Fibroid Dx by U/S 11/21. Cycles are regular, heavy, last 5-7 days, pads/tampoons, changes q 1 hr on heaviest days LMP currently No contraception Not sexual active H/O c section x 3 Pap 11/21 normal  PE AF VSS Lungs clear Heart RRR Abd soft + BS GU Nl EGBUS menes uterus slightly tender < 10 weeks no masses  A/P Uterine Fibroids        Menorrhagia Discussed treatment options with pt. Pt desires to try low dose OCP. U/R/B reviewed with pt. F/U in 3-4 weeks

## 2020-10-19 NOTE — Patient Instructions (Signed)
Uterine Fibroids  Uterine fibroids are lumps of tissue (tumors) in the womb (uterus). Fibroids are not cancerous. Most women with this condition do not need treatment. Sometimes, fibroids can make it harder to have children. If this happens, you may need surgery to take out the fibroids. What are the causes? The cause of this condition is not known. What increases the risk?  You are in your 30s or 40s and have not gone through menopause. Menopause is when you have not had a menstrual period for 12 months.  Having a history of fibroids in your family.  You are of African American descent.  You started your period at age 4 or younger.  You have not given birth.  You are overweight or very overweight. What are the signs or symptoms?  Bleeding between menstrual periods.  Heavy bleeding during your menstrual period.  Pain in the area between your hips.  Needing to pee (urinate) right away or more often than usual.  Not being able to have children (infertility).  Not being able to stay pregnant (miscarriage). Many women do not have symptoms.  How is this treated? Treatment may include:  Follow-up visits with your doctor to check your fibroids for any changes.  Medicines to help with pain, such as aspirin or ibuprofen.  Hormone therapy. This may be given as a pill, in a shot, or with a type of birth control device called an IUD.  Surgery that would do one of these things: ? Take out the fibroids. This may be done if you want to become pregnant. ? Take out the womb (hysterectomy). ? Stop the blood flow to the fibroids. Follow these instructions at home: Medicines  Take over-the-counter and prescription medicines only as told by your doctor.  Ask your doctor if you should: ? Take iron pills. ? Eat more foods that have a lot of iron in them, such as dark green, leafy vegetables. Managing pain If told, put heat on your back or belly. Do this as often as told by your  doctor. Use the heat source that your doctor recommends, such as a moist heat pack or a heating pad. To do this:  Put a towel between your skin and the heat pack or pad.  Leave the heat on for 20-30 minutes.  Take off the heat if your skin turns bright red. This is very important. If you cannot feel pain, heat, or cold, you may have a greater risk of getting burned.   General instructions  Tell your doctor about any changes to your menstrual period, such as: ? Heavy bleeding that needs a change of tampons or pads more than normal. ? A change in how many days your period lasts. ? A change in symptoms that come with your period. This might be belly cramps or back pain.  Keep all follow-up visits. Contact a doctor if:  You have pain that does not get better with medicine or heat. This may include pain or cramps in: ? The area between your hip bones. ? Your back. ? Your belly.  You have new bleeding between your periods.  You have more bleeding during or between your periods.  You feel very tired or weak.  You feel dizzy. Get help right away if:  You faint.  You have pain in the area between your hip bones that gets worse.  You have bleeding that soaks a tampon or pad in 30 minutes or less. Summary  Uterine fibroids are lumps of  tissue (tumors) in your womb. They are not cancerous.  Medicines such as aspirin or ibuprofen may be used to help with pain.  Contact a doctor if you have pain or cramps that do not get better with medicine.  Know the symptoms for when you should get help right away. This information is not intended to replace advice given to you by your health care provider. Make sure you discuss any questions you have with your health care provider. Document Revised: 04/26/2020 Document Reviewed: 04/26/2020 Elsevier Patient Education  2021 Elsevier Inc.  

## 2020-10-24 DIAGNOSIS — F411 Generalized anxiety disorder: Secondary | ICD-10-CM | POA: Diagnosis not present

## 2020-10-24 DIAGNOSIS — F332 Major depressive disorder, recurrent severe without psychotic features: Secondary | ICD-10-CM | POA: Diagnosis not present

## 2020-10-26 DIAGNOSIS — M25571 Pain in right ankle and joints of right foot: Secondary | ICD-10-CM | POA: Diagnosis not present

## 2020-10-26 DIAGNOSIS — M79604 Pain in right leg: Secondary | ICD-10-CM | POA: Diagnosis not present

## 2020-10-26 DIAGNOSIS — M25572 Pain in left ankle and joints of left foot: Secondary | ICD-10-CM | POA: Diagnosis not present

## 2020-10-26 DIAGNOSIS — G894 Chronic pain syndrome: Secondary | ICD-10-CM | POA: Diagnosis not present

## 2020-10-28 ENCOUNTER — Other Ambulatory Visit: Payer: Self-pay | Admitting: Orthopedic Surgery

## 2020-10-28 DIAGNOSIS — R52 Pain, unspecified: Secondary | ICD-10-CM

## 2020-10-31 DIAGNOSIS — F332 Major depressive disorder, recurrent severe without psychotic features: Secondary | ICD-10-CM | POA: Diagnosis not present

## 2020-10-31 DIAGNOSIS — F411 Generalized anxiety disorder: Secondary | ICD-10-CM | POA: Diagnosis not present

## 2020-11-07 DIAGNOSIS — F332 Major depressive disorder, recurrent severe without psychotic features: Secondary | ICD-10-CM | POA: Diagnosis not present

## 2020-11-07 DIAGNOSIS — F411 Generalized anxiety disorder: Secondary | ICD-10-CM | POA: Diagnosis not present

## 2020-11-14 DIAGNOSIS — F332 Major depressive disorder, recurrent severe without psychotic features: Secondary | ICD-10-CM | POA: Diagnosis not present

## 2020-11-14 DIAGNOSIS — F411 Generalized anxiety disorder: Secondary | ICD-10-CM | POA: Diagnosis not present

## 2020-11-18 ENCOUNTER — Telehealth: Payer: Self-pay

## 2020-11-18 DIAGNOSIS — G894 Chronic pain syndrome: Secondary | ICD-10-CM

## 2020-11-18 DIAGNOSIS — S82892S Other fracture of left lower leg, sequela: Secondary | ICD-10-CM

## 2020-11-18 DIAGNOSIS — S82891S Other fracture of right lower leg, sequela: Secondary | ICD-10-CM

## 2020-11-18 MED ORDER — OXYCODONE HCL 10 MG PO TABS: 10.0000 mg | ORAL_TABLET | Freq: Two times a day (BID) | ORAL | 0 refills | Status: DC | PRN

## 2020-11-18 NOTE — Telephone Encounter (Signed)
PMP was Reviewed:  Oxycodone e-scribed. Placed a call to Ms. Sabana regarding the above, she verbalizes understanding.

## 2020-11-18 NOTE — Telephone Encounter (Signed)
Monique [name] has requested a refill of Oxycodone 10 mg. Please send to [facility] on [address].   Call back ph  [phone]

## 2020-11-21 DIAGNOSIS — F332 Major depressive disorder, recurrent severe without psychotic features: Secondary | ICD-10-CM | POA: Diagnosis not present

## 2020-11-21 DIAGNOSIS — F411 Generalized anxiety disorder: Secondary | ICD-10-CM | POA: Diagnosis not present

## 2020-11-24 ENCOUNTER — Other Ambulatory Visit: Payer: Self-pay

## 2020-11-24 ENCOUNTER — Ambulatory Visit
Admission: RE | Admit: 2020-11-24 | Discharge: 2020-11-24 | Disposition: A | Discharge status: Home / Self Care | Payer: Medicaid Other | Source: Ambulatory Visit | Attending: Orthopedic Surgery | Admitting: Orthopedic Surgery

## 2020-11-24 DIAGNOSIS — R52 Pain, unspecified: Secondary | ICD-10-CM

## 2020-11-24 MED ORDER — GADOBENATE DIMEGLUMINE 529 MG/ML IV SOLN
19.0000 mL | Freq: Once | INTRAVENOUS | Status: AC | PRN
  Administered 2020-11-24: 19 mL via INTRAVENOUS

## 2020-11-28 DIAGNOSIS — F332 Major depressive disorder, recurrent severe without psychotic features: Secondary | ICD-10-CM | POA: Diagnosis not present

## 2020-11-28 DIAGNOSIS — F411 Generalized anxiety disorder: Secondary | ICD-10-CM | POA: Diagnosis not present

## 2020-12-05 DIAGNOSIS — F332 Major depressive disorder, recurrent severe without psychotic features: Secondary | ICD-10-CM | POA: Diagnosis not present

## 2020-12-05 DIAGNOSIS — F411 Generalized anxiety disorder: Secondary | ICD-10-CM | POA: Diagnosis not present

## 2020-12-07 DIAGNOSIS — M25571 Pain in right ankle and joints of right foot: Secondary | ICD-10-CM | POA: Diagnosis not present

## 2020-12-07 DIAGNOSIS — M25774 Osteophyte, right foot: Secondary | ICD-10-CM | POA: Diagnosis not present

## 2020-12-08 ENCOUNTER — Encounter: Payer: Self-pay | Admitting: Registered Nurse

## 2020-12-08 ENCOUNTER — Encounter: Payer: Medicaid Other | Attending: Physical Medicine & Rehabilitation | Admitting: Registered Nurse

## 2020-12-08 ENCOUNTER — Other Ambulatory Visit: Payer: Self-pay

## 2020-12-08 VITALS — BP 122/69 | HR 77 | Temp 98.6°F | Ht 65.0 in | Wt 210.6 lb

## 2020-12-08 DIAGNOSIS — S82891S Other fracture of right lower leg, sequela: Secondary | ICD-10-CM | POA: Insufficient documentation

## 2020-12-08 DIAGNOSIS — Z79891 Long term (current) use of opiate analgesic: Secondary | ICD-10-CM | POA: Insufficient documentation

## 2020-12-08 DIAGNOSIS — F068 Other specified mental disorders due to known physiological condition: Secondary | ICD-10-CM | POA: Diagnosis present

## 2020-12-08 DIAGNOSIS — G894 Chronic pain syndrome: Secondary | ICD-10-CM | POA: Insufficient documentation

## 2020-12-08 DIAGNOSIS — S82892S Other fracture of left lower leg, sequela: Secondary | ICD-10-CM | POA: Insufficient documentation

## 2020-12-08 DIAGNOSIS — Z5181 Encounter for therapeutic drug level monitoring: Secondary | ICD-10-CM | POA: Insufficient documentation

## 2020-12-08 DIAGNOSIS — S069X0S Unspecified intracranial injury without loss of consciousness, sequela: Secondary | ICD-10-CM | POA: Insufficient documentation

## 2020-12-08 MED ORDER — OXYCODONE HCL 10 MG PO TABS: 10.0000 mg | ORAL_TABLET | Freq: Two times a day (BID) | ORAL | 0 refills | Status: DC | PRN

## 2020-12-08 NOTE — Progress Notes (Signed)
Subjective:    Patient ID: Monique [name], female    DOB: 11-13-1985, 35 y.o.   MRN: [id number]  HPI: Monique [name] is a 35 y.o. female who returns for follow up appointment for chronic pain and medication refill. She states her pain is located in her bilateral ankles, also states she seen Dr Marcelino Scot and will be scheduled for surgery. Awaiting a surgical date. She rates her pain 6. Her current exercise regime is walking and performing stretching exercises.  Monique [name] equivalent is 45.00 MME. Monique [name] reports she was out of her medication for two weeks, she was awaiting a PA.She was able to pick her medication up today and she took a 1/2 tablet of her Oxycodone,this morning.   According to the PMP last fill date was 10/13/2020.We will continue with slow weaning of Oxycodone.   UDS ordered today.    Pain Inventory Average Pain 8 Pain Right Now 6 My pain is intermittent, sharp, stabbing, tingling and aching  In the last 24 hours, has pain interfered with the following? General activity 6 Relation with others 6 Enjoyment of life 6 What TIME of day is your pain at its worst? daytime, evening and night Sleep (in general) Fair  Pain is worse with: walking, bending, standing and some activites Pain improves with: rest, therapy/exercise and medication Relief from Meds: 7  Family History  Problem Relation Age of Onset  . Diabetes Father   . Cancer Paternal Grandmother        Ovarian   Social History   Socioeconomic History  . Marital status: Single    Spouse name: Not on file  . Number of children: 3  . Years of education: Not on file  . Highest education level: Some college, no degree  Occupational History  . Occupation: Disability  Tobacco Use  . Smoking status: Passive Smoke Exposure - Never Smoker  . Smokeless tobacco: Never Used  Vaping Use  . Vaping Use: Never used  Substance and Sexual Activity  . Alcohol use: Yes    Comment: occasional  .  Drug use: No  . Sexual activity: Yes    Birth control/protection: None  Other Topics Concern  . Not on file  Social History Narrative  . Not on file   Social Determinants of Health   Financial Resource Strain: Not on file  Food Insecurity: Not on file  Transportation Needs: Not on file  Physical Activity: Not on file  Stress: Not on file  Social Connections: Not on file   Past Surgical History:  Procedure Laterality Date  . APPENDECTOMY    . Ceasarean section  2007, 2010,2014  . EXTERNAL FIXATION LEG Bilateral 12/14/2012   Procedure: EXTERNAL FIXATION LEG;  Surgeon: [name], MD;  Location: [place];  Service: Orthopedics;  Laterality: Bilateral;  . EXTERNAL FIXATION REMOVAL Bilateral 12/23/2012   Procedure: REMOVAL EXTERNAL FIXATION LEG;  Surgeon: [name], MD;  Location: [place];  Service: Orthopedics;  Laterality: Bilateral;  . I & D EXTREMITY Left 12/14/2012   Procedure: IRRIGATION AND DEBRIDEMENT EXTREMITY;  Surgeon: [name], MD;  Location: [place];  Service: Orthopedics;  Laterality: Left;  . ORIF ANKLE FRACTURE Left 12/14/2012   Procedure: OPEN REDUCTION INTERNAL FIXATION (ORIF) ANKLE FRACTURE;  Surgeon: [name], MD;  Location: [place];  Service: Orthopedics;  Laterality: Left;  . ORIF TIBIA FRACTURE Bilateral 12/23/2012   Procedure: OPEN REDUCTION INTERNAL FIXATION (ORIF) BILATERAL DISTAL TIBIA FRACTURE;  Surgeon: [name],  MD;  Location: [place];  Service: Orthopedics;  Laterality: Bilateral;   Past Surgical History:  Procedure Laterality Date  . APPENDECTOMY    . Ceasarean section  2007, 2010,2014  . EXTERNAL FIXATION LEG Bilateral 12/14/2012   Procedure: EXTERNAL FIXATION LEG;  Surgeon: [name], MD;  Location: [place];  Service: Orthopedics;  Laterality: Bilateral;  . EXTERNAL FIXATION REMOVAL Bilateral 12/23/2012   Procedure: REMOVAL EXTERNAL FIXATION LEG;  Surgeon: [name], MD;  Location: [place];  Service: Orthopedics;  Laterality:  Bilateral;  . I & D EXTREMITY Left 12/14/2012   Procedure: IRRIGATION AND DEBRIDEMENT EXTREMITY;  Surgeon: [name], MD;  Location: [place];  Service: Orthopedics;  Laterality: Left;  . ORIF ANKLE FRACTURE Left 12/14/2012   Procedure: OPEN REDUCTION INTERNAL FIXATION (ORIF) ANKLE FRACTURE;  Surgeon: [name], MD;  Location: [place];  Service: Orthopedics;  Laterality: Left;  . ORIF TIBIA FRACTURE Bilateral 12/23/2012   Procedure: OPEN REDUCTION INTERNAL FIXATION (ORIF) BILATERAL DISTAL TIBIA FRACTURE;  Surgeon: [name], MD;  Location: [place];  Service: Orthopedics;  Laterality: Bilateral;   Past Medical History:  Diagnosis Date  . Anemia   . Anxiety   . Chronic pain syndrome   . Cognitive deficit as late effect of traumatic brain injury ([place])   . Depression   . Heart murmur   . Menorrhagia   . TBI (traumatic brain injury) ([place])    There were no vitals taken for this visit.  Opioid Risk Score:   Fall Risk Score:  `1  Depression screen PHQ 2/9  Depression screen [employer] 2/9 10/20/2020 10/13/2020 08/12/2020 08/12/2020 06/02/2020 04/26/2020 04/27/2019  Decreased Interest 0 0 0 1 2 0 0  Down, Depressed, Hopeless 0 0 0 0 0 0 0  PHQ - 2 Score 0 0 0 1 2 0 0  Altered sleeping 1 - - 3 3 - -  Tired, decreased energy 3 - - 2 3 - -  Change in appetite 1 - - 2 1 - -  Feeling bad or failure about yourself  0 - - 0 1 - -  Trouble concentrating 2 - - 1 2 - -  Moving slowly or fidgety/restless 0 - - 0 0 - -  Suicidal thoughts 0 - - 0 0 - -  PHQ-9 Score 7 - - 9 12 - -   Review of Systems  Musculoskeletal:       Ankle  All other systems reviewed and are negative.      Objective:   Physical Exam Vitals and nursing note reviewed.  Constitutional:      Appearance: Normal appearance.  Cardiovascular:     Rate and Rhythm: Normal rate and regular rhythm.     Pulses: Normal pulses.     Heart sounds: Normal heart sounds.  Pulmonary:     Effort: Pulmonary effort is normal.     Breath  sounds: Normal breath sounds.  Musculoskeletal:     Cervical back: Normal range of motion and neck supple.     Comments: Normal Muscle Bulk and Muscle Testing Reveals:  Upper Extremities: Full ROM and Muscle Strength 5/5  Lower Extremities: Full ROM and Muscle Strength 5/5 Arises from chair with ease Narrow Based Gait   Skin:    General: Skin is warm and dry.  Neurological:     Mental Status: She is alert and oriented to person, place, and time.  Psychiatric:        Mood and Affect:  Mood normal.        Behavior: Behavior normal.           Assessment & Plan:  1.TBI:Cognitive deficit:Continue to Monitor.12/08/2020. Continue with current treatment:using the various devices to help with your Organization such as notes, calendars and using her cell phone to set reminders. 2. Chronic Pain Syndrome:Continue withslow weaning of Oxycodone.  Continue::Oxycodone 10 mg1tablet BIDas needed as needed #45.Refill for the following month e-scribed.  We will continue the opioid monitoring program, this consists of regular clinic visits, examinations, urine drug screen, pill counts as well as use of [place] Controlled Substance Reporting system. A 12 month History has been reviewed on the [place] Controlled Substance Reporting Systemon03/12/2020. 3. Bilateral Pilon Fractures: Continueto Monitor.12/08/2020 4. Tibial Nerve Injuries: Continue to monitor.12/08/2020 5. Migraine:No complaints today.Continue to monitor.12/08/2020 6. Bilateral Ankle Pain: Dr Marcelino Scot Following. She's awaiting a surgical date, she reports.   F/U Visit in 2 months

## 2020-12-15 ENCOUNTER — Ambulatory Visit: Payer: Medicaid Other | Attending: Internal Medicine | Admitting: Internal Medicine

## 2020-12-15 ENCOUNTER — Other Ambulatory Visit: Payer: Self-pay

## 2020-12-15 DIAGNOSIS — D500 Iron deficiency anemia secondary to blood loss (chronic): Secondary | ICD-10-CM

## 2020-12-15 DIAGNOSIS — G894 Chronic pain syndrome: Secondary | ICD-10-CM

## 2020-12-15 DIAGNOSIS — D219 Benign neoplasm of connective and other soft tissue, unspecified: Secondary | ICD-10-CM
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
## 2020-12-26 LAB — TOXASSURE SELECT,+ANTIDEPR,UR

## 2020-12-29 ENCOUNTER — Telehealth: Payer: Self-pay | Admitting: *Deleted

## 2020-12-29 NOTE — Telephone Encounter (Signed)
Urine drug screen for this encounter is consistent for prescribed medication 

## 2021-01-31 ENCOUNTER — Inpatient Hospital Stay (HOSPITAL_COMMUNITY): Admission: RE | Admit: 2021-01-31 | Payer: Medicaid Other | Source: Ambulatory Visit

## 2021-02-01 ENCOUNTER — Other Ambulatory Visit (HOSPITAL_COMMUNITY)
Admission: RE | Admit: 2021-02-01 | Discharge: 2021-02-01 | Disposition: A | Discharge status: Home / Self Care | Payer: Medicaid Other | Source: Ambulatory Visit | Attending: Orthopedic Surgery | Admitting: Orthopedic Surgery

## 2021-02-01 ENCOUNTER — Encounter (HOSPITAL_COMMUNITY): Payer: Self-pay | Admitting: Orthopedic Surgery

## 2021-02-01 ENCOUNTER — Other Ambulatory Visit: Payer: Self-pay

## 2021-02-01 DIAGNOSIS — Z20822 Contact with and (suspected) exposure to covid-19: Secondary | ICD-10-CM | POA: Diagnosis not present

## 2021-02-01 DIAGNOSIS — Z01812 Encounter for preprocedural laboratory examination: Secondary | ICD-10-CM | POA: Insufficient documentation

## 2021-02-01 LAB — SARS CORONAVIRUS 2 (TAT 6-24 HRS): SARS Coronavirus 2: NEGATIVE
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
## 2021-02-01 NOTE — H&P (Signed)
Orthopaedic Trauma Service (OTS) Consult   Patient ID: Monique [name] MRN: [id number] DOB/AGE: 10-28-1985 35 y.o.   HPI: Monique [name] is an 35 y.o. female s/p polytrauma 2014. Symptomatic osteophyte R foot/ankle. Failed conservative treatment. Presents for excision of osteophytes   Past Medical History:  Diagnosis Date  . Anemia   . Anxiety   . Chronic pain syndrome   . Cognitive deficit as late effect of traumatic brain injury ([name])   . Depression   . Heart murmur   . Menorrhagia   . TBI (traumatic brain injury) [facility])     Past Surgical History:  Procedure Laterality Date  . APPENDECTOMY    . Ceasarean section  2007, 2010,2014  . EXTERNAL FIXATION LEG Bilateral 12/14/2012   Procedure: EXTERNAL FIXATION LEG;  Surgeon: [name], MD;  Location: [place];  Service: Orthopedics;  Laterality: Bilateral;  . EXTERNAL FIXATION REMOVAL Bilateral 12/23/2012   Procedure: REMOVAL EXTERNAL FIXATION LEG;  Surgeon: [name], MD;  Location: [place];  Service: Orthopedics;  Laterality: Bilateral;  . I & D EXTREMITY Left 12/14/2012   Procedure: IRRIGATION AND DEBRIDEMENT EXTREMITY;  Surgeon: [name], MD;  Location: [place];  Service: Orthopedics;  Laterality: Left;  . ORIF ANKLE FRACTURE Left 12/14/2012   Procedure: OPEN REDUCTION INTERNAL FIXATION (ORIF) ANKLE FRACTURE;  Surgeon: [name], MD;  Location: [place];  Service: Orthopedics;  Laterality: Left;  . ORIF TIBIA FRACTURE Bilateral 12/23/2012   Procedure: OPEN REDUCTION INTERNAL FIXATION (ORIF) BILATERAL DISTAL TIBIA FRACTURE;  Surgeon: [name], MD;  Location: [place];  Service: Orthopedics;  Laterality: Bilateral;    Family History  Problem Relation Age of Onset  . Diabetes Father   . Cancer Paternal Grandmother        Ovarian    Social History:  reports that she is a non-smoker but has been exposed to tobacco smoke. She has never used smokeless tobacco. She reports current alcohol use. She  reports that she does not use drugs.  Allergies: No Known Allergies  Medications: I have reviewed the patient's current medications. Current Meds  Medication Sig  . Azelaic Acid 15 % cream Apply 1 application topically daily as needed (irritation).  . ferrous sulfate 325 (65 FE) MG tablet Take 1 tablet (325 mg total) by mouth daily with breakfast. (Patient taking differently: Take 325 mg by mouth daily as needed (anemia).)  . naproxen (NAPROSYN) 500 MG tablet Take 1 tablet (500 mg total) by mouth 2 (two) times daily. (Patient taking differently: Take 500 mg by mouth 2 (two) times daily as needed for moderate pain.)  . omeprazole (PRILOSEC) 20 MG capsule Take 1 capsule (20 mg total) by mouth daily. (Patient taking differently: Take 20 mg by mouth daily as needed (acid reflux).)  . Oxycodone HCl 10 MG TABS Take 1 tablet (10 mg total) by mouth 2 (two) times daily as needed. (Patient taking differently: Take 10 mg by mouth 2 (two) times daily as needed (pain).)  . tretinoin (RETIN-A) 0.025 % cream Apply 1 application topically at bedtime as needed (irritation).     Results for orders placed or performed during the hospital encounter of 02/01/21 (from the past 48 hour(s))  SARS CORONAVIRUS 2 (TAT 6-24 HRS) Nasopharyngeal Nasopharyngeal Swab     Status: None   Collection Time: 02/01/21  8:17 AM   Specimen: Nasopharyngeal Swab  Result Value Ref Range   SARS Coronavirus 2 NEGATIVE NEGATIVE  Comment: (NOTE) SARS-CoV-2 target nucleic acids are NOT DETECTED.  The SARS-CoV-2 RNA is generally detectable in upper and lower respiratory specimens during the acute phase of infection. Negative results do not preclude SARS-CoV-2 infection, do not rule out co-infections with other pathogens, and should not be used as the sole basis for treatment or other patient management decisions. Negative results must be combined with clinical observations, patient history, and epidemiological information. The  expected result is Negative.  Fact Sheet for Patients: SugarRoll.be  Fact Sheet for Healthcare Providers: https://www.woods-mathews.com/  This test is not yet approved or cleared by the [place] FDA and  has been authorized for detection and/or diagnosis of SARS-CoV-2 by FDA under an Emergency Use Authorization (EUA). This EUA will remain  in effect (meaning this test can be used) for the duration of the COVID-19 declaration under Se ction 564(b)(1) of the Act, 21 U.S.C. section 360bbb-3(b)(1), unless the authorization is terminated or revoked sooner.  Performed at [facility], [place] [address], [place], [place] [address]     No results found.  Intake/Output    None      Review of Systems  Musculoskeletal:       R ankle pain   All other systems reviewed and are negative.  Height 5\' 5"  (1.651 m), weight 93 kg, last menstrual period 01/15/2021. Physical Exam Constitutional:      General: She is not in acute distress.    Appearance: Normal appearance. She is normal weight.  HENT:     Head: Normocephalic and atraumatic.  Cardiovascular:     Rate and Rhythm: Normal rate and regular rhythm.  Pulmonary:     Effort: Pulmonary effort is normal.     Breath sounds: Normal breath sounds.  Musculoskeletal:     Comments: Right Lower Extremity  Mild right ankle joint line tenderness Altered gait  Restricted R ankle motion, notable with extension  Motor and sensory functions o/w intact Ext warm  + DP pulse   Skin:    General: Skin is warm.     Capillary Refill: Capillary refill takes less than 2 seconds.  Neurological:     General: No focal deficit present.     Mental Status: She is alert and oriented to person, place, and time.  Psychiatric:        Mood and Affect: Mood normal.        Behavior: Behavior normal.     Assessment/Plan:  35 y/o female with symptomatic osteophyte R ankle and foot s/p ORIF R pilon fx  2014  -symptomatic osteophyte R talus and distal tibia   OR For excision of osteophytes  WBAT post op  ROM as tolerated   No formal restrictions post op  outpt procedure  Risks and benefits reviewed with pt, she wishes to proceed    Jari Pigg, PA-C [phone] (C) 02/01/2021, 5:26 PM  [facility] [place] [phone] Jenetta [phone] (F)    After 5pm and on the weekends please log on to Amion, go to orthopaedics and the look under the [facility] Call for the provider(s) on call. You can also call our office at [phone] and then follow the prompts to be connected to the call team.

## 2021-02-01 NOTE — Progress Notes (Signed)
PCP - Dr. Wynetta Emery  Cardiologist -  Chest x-ray -  EKG - 10/12/20 Stress Test -  ECHO -  Cardiac Cath -  COVID TEST- 02/01/21 pending results   Anesthesia review: murmur from childhood but hasn't heard anything about it since   -------------  SDW INSTRUCTIONS:  Your procedure is scheduled on 02/02/21. Please report to [facility] Main Entrance "A" at 05:30 A.M., and check in at the Admitting office. Call this number if you have problems the morning of surgery: [phone]   Remember: Do not eat or drink after midnight the night before your surgery   Medications to take morning of surgery with a sip of water include: omeprazole (PRILOSEC)    As of today, STOP taking any Aspirin (unless otherwise instructed by your surgeon), Aleve, Naproxen, Ibuprofen, Motrin, Advil, Goody's, BC's, all herbal medications, fish oil, and all vitamins.    The Morning of Surgery Do not wear jewelry, make-up or nail polish. Do not wear lotions, powders, or perfumes, or deodorant Do not shave 48 hours prior to surgery.   Do not bring valuables to the hospital. [facility] is not responsible for any belongings or valuables. If you are a smoker, DO NOT Smoke 24 hours prior to surgery If you wear a CPAP at night please bring your mask the morning of surgery  Remember that you must have someone to transport you home after your surgery, and remain with you for 24 hours if you are discharged the same day. Please bring cases for contacts, glasses, hearing aids, dentures or bridgework because it cannot be worn into surgery.   Patients discharged the day of surgery will not be allowed to drive home.   Please shower the NIGHT BEFORE SURGERY and the MORNING OF SURGERY with DIAL Soap. Wear comfortable clothes the morning of surgery. Oral Hygiene is also important to reduce your risk of infection.  Remember - BRUSH YOUR TEETH THE MORNING OF SURGERY WITH YOUR REGULAR TOOTHPASTE  Patient denies shortness of breath,  fever, cough and chest pain.

## 2021-02-01 NOTE — Anesthesia Preprocedure Evaluation (Addendum)
Anesthesia Evaluation  Patient identified by MRN, date of birth, ID band Patient awake    Reviewed: Allergy & Precautions, NPO status , Patient's Chart, lab work & pertinent test results  Airway Mallampati: II  TM Distance: >3 FB Neck ROM: Full    Dental no notable dental hx.    Pulmonary neg pulmonary ROS,    Pulmonary exam normal breath sounds clear to auscultation       Cardiovascular negative cardio ROS Normal cardiovascular exam Rhythm:Regular Rate:Normal  ECG: NSR, rate 85   Neuro/Psych  Headaches, PSYCHIATRIC DISORDERS Anxiety Depression TBI (traumatic brain injury)    GI/Hepatic Neg liver ROS, GERD  Medicated and Controlled,  Endo/Other  negative endocrine ROS  Renal/GU negative Renal ROS     Musculoskeletal Chronic pain syndrome   Abdominal (+) + obese,   Peds  Hematology  (+) anemia ,   Anesthesia Other Findings OSTEOPHYTE AND SYMPTOMATIC HARDWARE RT RIGHT ANKLE  Reproductive/Obstetrics                             Anesthesia Physical Anesthesia Plan  ASA: II  Anesthesia Plan: General   Post-op Pain Management:    Induction: Intravenous  PONV Risk Score and Plan: 3 and Ondansetron, Dexamethasone, Midazolam and Treatment may vary due to age or medical condition  Airway Management Planned: Oral ETT  Additional Equipment:   Intra-op Plan:   Post-operative Plan: Extubation in OR  Informed Consent: I have reviewed the patients History and Physical, chart, labs and discussed the procedure including the risks, benefits and alternatives for the proposed anesthesia with the patient or authorized representative who has indicated his/her understanding and acceptance.     Dental advisory given  Plan Discussed with: CRNA  Anesthesia Plan Comments: (Reviewed PAT note written 02/01/2021 by Myra Gianotti, PA-C. Potential post-op regional anesthesia discussed)        Anesthesia Quick Evaluation

## 2021-02-02 ENCOUNTER — Ambulatory Visit (HOSPITAL_COMMUNITY): Payer: Medicaid Other | Admitting: Vascular Surgery

## 2021-02-02 ENCOUNTER — Encounter (HOSPITAL_COMMUNITY)
Admission: RE | Disposition: A | Discharge status: Home / Self Care | Payer: Self-pay | Source: Home / Self Care | Attending: Orthopedic Surgery

## 2021-02-02 ENCOUNTER — Other Ambulatory Visit: Payer: Self-pay

## 2021-02-02 ENCOUNTER — Ambulatory Visit (HOSPITAL_COMMUNITY): Payer: Medicaid Other

## 2021-02-02 ENCOUNTER — Encounter (HOSPITAL_COMMUNITY): Payer: Self-pay | Admitting: Orthopedic Surgery

## 2021-02-02 ENCOUNTER — Ambulatory Visit (HOSPITAL_COMMUNITY)
Admission: RE | Admit: 2021-02-02 | Discharge: 2021-02-02 | Disposition: A | Discharge status: Home / Self Care | Payer: Medicaid Other | Attending: Orthopedic Surgery | Admitting: Orthopedic Surgery

## 2021-02-02 DIAGNOSIS — Z79899 Other long term (current) drug therapy: Secondary | ICD-10-CM | POA: Insufficient documentation

## 2021-02-02 DIAGNOSIS — Z7722 Contact with and (suspected) exposure to environmental tobacco smoke (acute) (chronic): Secondary | ICD-10-CM | POA: Insufficient documentation

## 2021-02-02 DIAGNOSIS — Z793 Long term (current) use of hormonal contraceptives: Secondary | ICD-10-CM | POA: Diagnosis not present

## 2021-02-02 DIAGNOSIS — G894 Chronic pain syndrome: Secondary | ICD-10-CM | POA: Diagnosis not present

## 2021-02-02 DIAGNOSIS — Z87820 Personal history of traumatic brain injury: Secondary | ICD-10-CM | POA: Insufficient documentation

## 2021-02-02 DIAGNOSIS — M25771 Osteophyte, right ankle: Secondary | ICD-10-CM | POA: Insufficient documentation

## 2021-02-02 DIAGNOSIS — Z419 Encounter for procedure for purposes other than remedying health state, unspecified: Secondary | ICD-10-CM

## 2021-02-02 DIAGNOSIS — M899 Disorder of bone, unspecified: Secondary | ICD-10-CM | POA: Diagnosis present

## 2021-02-02 DIAGNOSIS — Z791 Long term (current) use of non-steroidal anti-inflammatories (NSAID): Secondary | ICD-10-CM | POA: Diagnosis not present

## 2021-02-02 HISTORY — PX: HARDWARE REMOVAL: SHX979

## 2021-02-02 HISTORY — PX: BONE EXCISION: SHX6730

## 2021-02-02 LAB — CBC
HCT: 33 % — ABNORMAL LOW (ref 36.0–46.0)
Hemoglobin: 9.8 g/dL — ABNORMAL LOW (ref 12.0–15.0)
MCH: 22 pg — ABNORMAL LOW (ref 26.0–34.0)
MCHC: 29.7 g/dL — ABNORMAL LOW (ref 30.0–36.0)
MCV: 74 fL — ABNORMAL LOW (ref 80.0–100.0)
Platelets: 362 10*3/uL (ref 150–400)
RBC: 4.46 MIL/uL (ref 3.87–5.11)
RDW: 18.2 % — ABNORMAL HIGH (ref 11.5–15.5)
WBC: 6.9 10*3/uL (ref 4.0–10.5)
nRBC: 0 % (ref 0.0–0.2)

## 2021-02-02 LAB — BASIC METABOLIC PANEL
Anion gap: 8 (ref 5–15)
BUN: 7 mg/dL (ref 6–20)
CO2: 27 mmol/L (ref 22–32)
Calcium: 9.2 mg/dL (ref 8.9–10.3)
Chloride: 102 mmol/L (ref 98–111)
Creatinine, Ser: 0.75 mg/dL (ref 0.44–1.00)
GFR, Estimated: >60 mL/min (ref >60)
Glucose, Bld: 99 mg/dL (ref 70–99)
Potassium: 3.4 mmol/L — ABNORMAL LOW (ref 3.5–5.1)
Sodium: 137 mmol/L (ref 135–145)

## 2021-02-02 LAB — SURGICAL PCR SCREEN
MRSA, PCR: NEGATIVE
Staphylococcus aureus: NEGATIVE

## 2021-02-02 LAB — POCT PREGNANCY, URINE: Preg Test, Ur: NEGATIVE

## 2021-02-02 SURGERY — BONE EXCISION
Anesthesia: General | Site: Ankle | Laterality: Right

## 2021-02-02 MED ORDER — TRAMADOL HCL 50 MG PO TABS: 50.0000 mg | ORAL_TABLET | Freq: Three times a day (TID) | ORAL | 0 refills | Status: DC | PRN

## 2021-02-02 MED ORDER — BUPIVACAINE-EPINEPHRINE (PF) 0.25% -1:200000 IJ SOLN
INTRAMUSCULAR | Status: DC | PRN
  Administered 2021-02-02: 30 mL via PERINEURAL

## 2021-02-02 MED ORDER — PROPOFOL 10 MG/ML IV BOLUS
INTRAVENOUS | Status: AC
  Filled 2021-02-02: qty 20

## 2021-02-02 MED ORDER — OXYCODONE HCL 5 MG PO TABS
ORAL_TABLET | ORAL | Status: AC
  Filled 2021-02-02: qty 1

## 2021-02-02 MED ORDER — FENTANYL CITRATE (PF) 100 MCG/2ML IJ SOLN: INTRAMUSCULAR | Status: DC | PRN

## 2021-02-02 MED ORDER — ACETAMINOPHEN 500 MG PO TABS: 1000.0000 mg | ORAL_TABLET | Freq: Once | ORAL | Status: AC

## 2021-02-02 MED ORDER — LIDOCAINE 2% (20 MG/ML) 5 ML SYRINGE
INTRAMUSCULAR | Status: DC | PRN
  Administered 2021-02-02: 60 mg via INTRAVENOUS

## 2021-02-02 MED ORDER — OXYCODONE HCL 5 MG PO TABS
5.0000 mg | ORAL_TABLET | Freq: Once | ORAL | Status: AC | PRN
  Administered 2021-02-02: 5 mg via ORAL

## 2021-02-02 MED ORDER — KETAMINE HCL 50 MG/5ML IJ SOSY
PREFILLED_SYRINGE | INTRAMUSCULAR | Status: AC
  Filled 2021-02-02: qty 5

## 2021-02-02 MED ORDER — AMISULPRIDE (ANTIEMETIC) 5 MG/2ML IV SOLN: 10.0000 mg | Freq: Once | INTRAVENOUS | Status: DC | PRN

## 2021-02-02 MED ORDER — KETAMINE HCL 10 MG/ML IJ SOLN
INTRAMUSCULAR | Status: DC | PRN
  Administered 2021-02-02: 40 mg via INTRAVENOUS

## 2021-02-02 MED ORDER — MIDAZOLAM HCL 2 MG/2ML IJ SOLN
INTRAMUSCULAR | Status: AC
  Filled 2021-02-02: qty 2

## 2021-02-02 MED ORDER — FENTANYL CITRATE (PF) 100 MCG/2ML IJ SOLN
INTRAMUSCULAR | Status: AC
  Filled 2021-02-02: qty 2

## 2021-02-02 MED ORDER — KETOROLAC TROMETHAMINE 10 MG PO TABS: 10.0000 mg | ORAL_TABLET | Freq: Four times a day (QID) | ORAL | 0 refills | Status: DC | PRN

## 2021-02-02 MED ORDER — FENTANYL CITRATE (PF) 100 MCG/2ML IJ SOLN: 25.0000 ug | INTRAMUSCULAR | Status: DC | PRN

## 2021-02-02 MED ORDER — FENTANYL CITRATE (PF) 250 MCG/5ML IJ SOLN
INTRAMUSCULAR | Status: AC
  Filled 2021-02-02: qty 5

## 2021-02-02 MED ORDER — DEXMEDETOMIDINE (PRECEDEX) IN NS 20 MCG/5ML (4 MCG/ML) IV SYRINGE
PREFILLED_SYRINGE | INTRAVENOUS | Status: DC | PRN
  Administered 2021-02-02: 12 ug via INTRAVENOUS

## 2021-02-02 MED ORDER — PROMETHAZINE HCL 25 MG/ML IJ SOLN: 6.2500 mg | INTRAMUSCULAR | Status: DC | PRN

## 2021-02-02 MED ORDER — FENTANYL CITRATE (PF) 250 MCG/5ML IJ SOLN
INTRAMUSCULAR | Status: DC | PRN
  Administered 2021-02-02: 100 ug via INTRAVENOUS
  Administered 2021-02-02: 150 ug via INTRAVENOUS

## 2021-02-02 MED ORDER — DEXAMETHASONE SODIUM PHOSPHATE 10 MG/ML IJ SOLN
INTRAMUSCULAR | Status: AC
  Filled 2021-02-02: qty 1

## 2021-02-02 MED ORDER — DEXMEDETOMIDINE (PRECEDEX) IN NS 20 MCG/5ML (4 MCG/ML) IV SYRINGE
PREFILLED_SYRINGE | INTRAVENOUS | Status: AC
  Filled 2021-02-02: qty 5

## 2021-02-02 MED ORDER — DEXAMETHASONE SODIUM PHOSPHATE 10 MG/ML IJ SOLN
INTRAMUSCULAR | Status: DC | PRN
  Administered 2021-02-02: 8 mg via INTRAVENOUS

## 2021-02-02 MED ORDER — ORAL CARE MOUTH RINSE: 15.0000 mL | Freq: Once | OROMUCOSAL | Status: AC

## 2021-02-02 MED ORDER — ROCURONIUM BROMIDE 10 MG/ML (PF) SYRINGE
PREFILLED_SYRINGE | INTRAVENOUS | Status: AC
  Filled 2021-02-02: qty 10

## 2021-02-02 MED ORDER — PROPOFOL 10 MG/ML IV BOLUS
INTRAVENOUS | Status: DC | PRN
  Administered 2021-02-02: 200 mg via INTRAVENOUS

## 2021-02-02 MED ORDER — LACTATED RINGERS IV SOLN: INTRAVENOUS | Status: DC

## 2021-02-02 MED ORDER — CHLORHEXIDINE GLUCONATE 0.12 % MT SOLN
15.0000 mL | Freq: Once | OROMUCOSAL | Status: AC
  Administered 2021-02-02: 15 mL via OROMUCOSAL
  Filled 2021-02-02: qty 15

## 2021-02-02 MED ORDER — ONDANSETRON 4 MG PO TBDP: 4.0000 mg | ORAL_TABLET | Freq: Three times a day (TID) | ORAL | 0 refills | Status: DC | PRN

## 2021-02-02 MED ORDER — PHENYLEPHRINE 40 MCG/ML (10ML) SYRINGE FOR IV PUSH (FOR BLOOD PRESSURE SUPPORT)
PREFILLED_SYRINGE | INTRAVENOUS | Status: AC
  Filled 2021-02-02: qty 10

## 2021-02-02 MED ORDER — SUGAMMADEX SODIUM 200 MG/2ML IV SOLN
INTRAVENOUS | Status: DC | PRN
  Administered 2021-02-02: 200 mg via INTRAVENOUS

## 2021-02-02 MED ORDER — ACETAMINOPHEN 500 MG PO TABS
ORAL_TABLET | ORAL | Status: AC
  Administered 2021-02-02: 1000 mg via ORAL
  Filled 2021-02-02: qty 2

## 2021-02-02 MED ORDER — BUPIVACAINE-EPINEPHRINE 0.5% -1:200000 IJ SOLN
INTRAMUSCULAR | Status: AC
  Filled 2021-02-02: qty 1

## 2021-02-02 MED ORDER — LIDOCAINE 2% (20 MG/ML) 5 ML SYRINGE
INTRAMUSCULAR | Status: AC
  Filled 2021-02-02: qty 5

## 2021-02-02 MED ORDER — CEFAZOLIN SODIUM-DEXTROSE 2-4 GM/100ML-% IV SOLN
2.0000 g | INTRAVENOUS | Status: AC
  Administered 2021-02-02: 2 g via INTRAVENOUS
  Filled 2021-02-02: qty 100

## 2021-02-02 MED ORDER — ONDANSETRON HCL 4 MG/2ML IJ SOLN
INTRAMUSCULAR | Status: AC
  Filled 2021-02-02: qty 2

## 2021-02-02 MED ORDER — ONDANSETRON HCL 4 MG/2ML IJ SOLN
INTRAMUSCULAR | Status: DC | PRN
  Administered 2021-02-02: 4 mg via INTRAVENOUS

## 2021-02-02 MED ORDER — KETOROLAC TROMETHAMINE 30 MG/ML IJ SOLN: 30.0000 mg | Freq: Once | INTRAMUSCULAR | Status: DC | PRN

## 2021-02-02 MED ORDER — OXYCODONE HCL 5 MG/5ML PO SOLN: 5.0000 mg | Freq: Once | ORAL | Status: AC | PRN

## 2021-02-02 MED ORDER — ROCURONIUM BROMIDE 10 MG/ML (PF) SYRINGE
PREFILLED_SYRINGE | INTRAVENOUS | Status: DC | PRN
  Administered 2021-02-02: 40 mg via INTRAVENOUS

## 2021-02-02 MED ORDER — MIDAZOLAM HCL 5 MG/5ML IJ SOLN
INTRAMUSCULAR | Status: DC | PRN
  Administered 2021-02-02: 2 mg via INTRAVENOUS

## 2021-02-02 MED ORDER — 0.9 % SODIUM CHLORIDE (POUR BTL) OPTIME
TOPICAL | Status: DC | PRN
  Administered 2021-02-02: 1000 mL

## 2021-02-02 SURGICAL SUPPLY — 61 items
BANDAGE ESMARK 6X9 LF (GAUZE/BANDAGES/DRESSINGS) ×1 IMPLANT
BNDG COHESIVE 6X5 TAN STRL LF (GAUZE/BANDAGES/DRESSINGS) ×2 IMPLANT
BNDG ELASTIC 4X5.8 VLCR STR LF (GAUZE/BANDAGES/DRESSINGS) ×2 IMPLANT
BNDG ELASTIC 6X5.8 VLCR STR LF (GAUZE/BANDAGES/DRESSINGS) ×2 IMPLANT
BNDG ESMARK 6X9 LF (GAUZE/BANDAGES/DRESSINGS) ×2
BNDG GAUZE ELAST 4 BULKY (GAUZE/BANDAGES/DRESSINGS) ×2 IMPLANT
BRUSH SCRUB EZ PLAIN DRY (MISCELLANEOUS) ×4 IMPLANT
COVER SURGICAL LIGHT HANDLE (MISCELLANEOUS) ×4 IMPLANT
COVER WAND RF STERILE (DRAPES) ×2 IMPLANT
CUFF TOURN SGL QUICK 18X4 (TOURNIQUET CUFF) IMPLANT
CUFF TOURN SGL QUICK 24 (TOURNIQUET CUFF)
CUFF TOURN SGL QUICK 34 (TOURNIQUET CUFF)
CUFF TRNQT CYL 24X4X16.5-23 (TOURNIQUET CUFF) IMPLANT
CUFF TRNQT CYL 34X4.125X (TOURNIQUET CUFF) IMPLANT
DRAPE C-ARM 42X72 X-RAY (DRAPES) IMPLANT
DRAPE C-ARMOR (DRAPES) ×2 IMPLANT
DRAPE U-SHAPE 47X51 STRL (DRAPES) ×2 IMPLANT
DRSG ADAPTIC 3X8 NADH LF (GAUZE/BANDAGES/DRESSINGS) ×2 IMPLANT
DRSG EMULSION OIL 3X3 NADH (GAUZE/BANDAGES/DRESSINGS) ×2 IMPLANT
DRSG MEPITEL 3X4 ME34 (GAUZE/BANDAGES/DRESSINGS) ×2 IMPLANT
ELECT REM PT RETURN 9FT ADLT (ELECTROSURGICAL) ×2
ELECTRODE REM PT RTRN 9FT ADLT (ELECTROSURGICAL) ×1 IMPLANT
GAUZE SPONGE 4X4 12PLY STRL (GAUZE/BANDAGES/DRESSINGS) ×2 IMPLANT
GLOVE BIO SURGEON STRL SZ7.5 (GLOVE) ×2 IMPLANT
GLOVE BIO SURGEON STRL SZ8 (GLOVE) ×2 IMPLANT
GLOVE BIOGEL PI IND STRL 7.5 (GLOVE) ×2 IMPLANT
GLOVE BIOGEL PI INDICATOR 7.5 (GLOVE) ×2
GLOVE SRG 8 PF TXTR STRL LF DI (GLOVE) ×1 IMPLANT
GLOVE SURG UNDER POLY LF SZ8 (GLOVE) ×1
GOWN STRL REUS W/ TWL LRG LVL3 (GOWN DISPOSABLE) ×2 IMPLANT
GOWN STRL REUS W/ TWL XL LVL3 (GOWN DISPOSABLE) ×1 IMPLANT
GOWN STRL REUS W/TWL LRG LVL3 (GOWN DISPOSABLE) ×2
GOWN STRL REUS W/TWL XL LVL3 (GOWN DISPOSABLE) ×1
KIT BASIN OR (CUSTOM PROCEDURE TRAY) ×2 IMPLANT
KIT TURNOVER KIT B (KITS) ×2 IMPLANT
MANIFOLD NEPTUNE II (INSTRUMENTS) ×2 IMPLANT
NEEDLE 22X1 1/2 (OR ONLY) (NEEDLE) IMPLANT
NS IRRIG 1000ML POUR BTL (IV SOLUTION) ×2 IMPLANT
PACK ORTHO EXTREMITY (CUSTOM PROCEDURE TRAY) ×2 IMPLANT
PAD ARMBOARD 7.5X6 YLW CONV (MISCELLANEOUS) ×4 IMPLANT
PADDING CAST COTTON 6X4 STRL (CAST SUPPLIES) ×6 IMPLANT
SPONGE LAP 18X18 RF (DISPOSABLE) ×2 IMPLANT
STAPLER VISISTAT 35W (STAPLE) IMPLANT
STOCKINETTE IMPERVIOUS LG (DRAPES) ×2 IMPLANT
STRIP CLOSURE SKIN 1/2X4 (GAUZE/BANDAGES/DRESSINGS) IMPLANT
SUCTION FRAZIER HANDLE 10FR (MISCELLANEOUS) ×1
SUCTION TUBE FRAZIER 10FR DISP (MISCELLANEOUS) ×1 IMPLANT
SUT ETHILON 2 0 FS 18 (SUTURE) ×2 IMPLANT
SUT ETHILON 3 0 PS 1 (SUTURE) IMPLANT
SUT PDS AB 2-0 CT1 27 (SUTURE) IMPLANT
SUT VIC AB 0 CT1 27 (SUTURE)
SUT VIC AB 0 CT1 27XBRD ANBCTR (SUTURE) IMPLANT
SUT VIC AB 2-0 CT1 27 (SUTURE) ×1
SUT VIC AB 2-0 CT1 TAPERPNT 27 (SUTURE) ×1 IMPLANT
SYR CONTROL 10ML LL (SYRINGE) IMPLANT
TOWEL GREEN STERILE (TOWEL DISPOSABLE) ×4 IMPLANT
TOWEL GREEN STERILE FF (TOWEL DISPOSABLE) ×4 IMPLANT
TUBE CONNECTING 12X1/4 (SUCTIONS) ×2 IMPLANT
UNDERPAD 30X36 HEAVY ABSORB (UNDERPADS AND DIAPERS) ×2 IMPLANT
WATER STERILE IRR 1000ML POUR (IV SOLUTION) ×4 IMPLANT
YANKAUER SUCT BULB TIP NO VENT (SUCTIONS) ×2 IMPLANT

## 2021-02-02 NOTE — Progress Notes (Signed)
Orthopedic Tech Progress Note Patient Details:  Monique [name] 1985-11-07 [id number]  Ortho Devices Type of Ortho Device: Postop shoe/boot Ortho Device/Splint Location: RLE Ortho Device/Splint Interventions: Ordered       Zalea Pete A Octavius Shin 02/02/2021, 10:50 AM

## 2021-02-02 NOTE — Discharge Instructions (Addendum)
Orthopaedic Trauma Service Discharge Instructions   General Discharge Instructions  WEIGHT BEARING STATUS: Weightbearing as tolerated right leg   RANGE OF MOTION/ACTIVITY: unrestricted range of motion right ankle. Activity as tolerated.   Wound Care: daily wound care/dressing changes starting on 02/04/2021. See below  Discharge Wound Care Instructions  Do NOT apply any ointments, solutions or lotions to pin sites or surgical wounds.  These prevent needed drainage and even though solutions like hydrogen peroxide kill bacteria, they also damage cells lining the pin sites that help fight infection.  Applying lotions or ointments can keep the wounds moist and can cause them to breakdown and open up as well. This can increase the risk for infection. When in doubt call the office.  Surgical incisions should be dressed daily.  If any drainage is noted, use one layer of adaptic, then gauze, Kerlix, and an ace wrap.  Once the incision is completely dry and without drainage, it may be left open to air out.  Showering may begin 36-48 hours later.  Cleaning gently with soap and water.    Diet: as you were eating previously.  Can use over the counter stool softeners and bowel preparations, such as Miralax, to help with bowel movements.  Narcotics can be constipating.  Be sure to drink plenty of fluids  PAIN MEDICATION USE AND EXPECTATIONS  You have likely been given narcotic medications to help control your pain.  After a traumatic event that results in an fracture (broken bone) with or without surgery, it is ok to use narcotic pain medications to help control one's pain.  We understand that everyone responds to pain differently and each individual patient will be evaluated on a regular basis for the continued need for narcotic medications. Ideally, narcotic medication use should last no more than 6-8 weeks (coinciding with fracture healing).   As a patient it is your responsibility as well to  monitor narcotic medication use and report the amount and frequency you use these medications when you come to your office visit.   We would also advise that if you are using narcotic medications, you should take a dose prior to therapy to maximize you participation.  IF YOU ARE ON NARCOTIC MEDICATIONS IT IS NOT PERMISSIBLE TO OPERATE A MOTOR VEHICLE (MOTORCYCLE/CAR/TRUCK/MOPED) OR HEAVY MACHINERY DO NOT MIX NARCOTICS WITH OTHER CNS (CENTRAL NERVOUS SYSTEM) DEPRESSANTS SUCH AS ALCOHOL   STOP SMOKING OR USING NICOTINE PRODUCTS!!!!  As discussed nicotine severely impairs your body's ability to heal surgical and traumatic wounds but also impairs bone healing.  Wounds and bone heal by forming microscopic blood vessels (angiogenesis) and nicotine is a vasoconstrictor (essentially, shrinks blood vessels).  Therefore, if vasoconstriction occurs to these microscopic blood vessels they essentially disappear and are unable to deliver necessary nutrients to the healing tissue.  This is one modifiable factor that you can do to dramatically increase your chances of healing your injury.    (This means no smoking, no nicotine gum, patches, etc)  DO NOT USE NONSTEROIDAL ANTI-INFLAMMATORY DRUGS (NSAID'S)  Using products such as Advil (ibuprofen), Aleve (naproxen), Motrin (ibuprofen) for additional pain control during fracture healing can delay and/or prevent the healing response.  If you would like to take over the counter (OTC) medication, Tylenol (acetaminophen) is ok.  However, some narcotic medications that are given for pain control contain acetaminophen as well. Therefore, you should not exceed more than 4000 mg of tylenol in a day if you do not have liver disease.  Also note that  there are may OTC medicines, such as cold medicines and allergy medicines that my contain tylenol as well.  If you have any questions about medications and/or interactions please ask your doctor/PA or your pharmacist.      ICE AND  ELEVATE INJURED/OPERATIVE EXTREMITY  Using ice and elevating the injured extremity above your heart can help with swelling and pain control.  Icing in a pulsatile fashion, such as 20 minutes on and 20 minutes off, can be followed.    Do not place ice directly on skin. Make sure there is a barrier between to skin and the ice pack.    Using frozen items such as frozen peas works well as the conform nicely to the are that needs to be iced.  USE AN ACE WRAP OR TED HOSE FOR SWELLING CONTROL  In addition to icing and elevation, Ace wraps or TED hose are used to help limit and resolve swelling.  It is recommended to use Ace wraps or TED hose until you are informed to stop.    When using Ace Wraps start the wrapping distally (farthest away from the body) and wrap proximally (closer to the body)   Example: If you had surgery on your leg or thing and you do not have a splint on, start the ace wrap at the toes and work your way up to the thigh        If you had surgery on your upper extremity and do not have a splint on, start the ace wrap at your fingers and work your way up to the upper arm  IF YOU ARE IN A SPLINT OR CAST DO NOT [place]   If your splint gets wet for any reason please contact the office immediately. You may shower in your splint or cast as long as you keep it dry.  This can be done by wrapping in a cast cover or garbage back (or similar)  Do Not stick any thing down your splint or cast such as pencils, money, or hangers to try and scratch yourself with.  If you feel itchy take benadryl as prescribed on the bottle for itching  IF YOU ARE IN A CAM BOOT (BLACK BOOT)  You may remove boot periodically. Perform daily dressing changes as noted below.  Wash the liner of the boot regularly and wear a sock when wearing the boot. It is recommended that you sleep in the boot until told otherwise    Call office for the following:  Temperature greater than 101F  Persistent nausea  and vomiting  Severe uncontrolled pain  Redness, tenderness, or signs of infection (pain, swelling, redness, odor or green/yellow discharge around the site)  Difficulty breathing, headache or visual disturbances  Hives  Persistent dizziness or light-headedness  Extreme fatigue  Any other questions or concerns you may have after discharge  In an emergency, call 911 or go to an Emergency Department at a nearby hospital  HELPFUL INFORMATION  ? If you had a block, it will wear off between 8-24 hrs postop typically.  This is period when your pain may go from nearly zero to the pain you would have had postop without the block.  This is an abrupt transition but nothing dangerous is happening.  You may take an extra dose of narcotic when this happens.  ? You should wean off your narcotic medicines as soon as you are able.  Most patients will be off or using minimal narcotics before their first  postop appointment.   ? We suggest you use the pain medication the first night prior to going to bed, in order to ease any pain when the anesthesia wears off. You should avoid taking pain medications on an empty stomach as it will make you nauseous.  ? Do not drink alcoholic beverages or take illicit drugs when taking pain medications.  ? In most states it is against the law to drive while you are in a splint or sling.  And certainly against the law to drive while taking narcotics.  ? You may return to work/school in the next couple of days when you feel up to it.   ? Pain medication may make you constipated.  Below are a few solutions to try in this order: - Decrease the amount of pain medication if you aren't having pain. - Drink lots of decaffeinated fluids. - Drink prune juice and/or each dried prunes  o If the first 3 don't work start with additional solutions - Take Colace - an over-the-counter stool softener - Take Senokot - an over-the-counter laxative - Take Miralax - a stronger  over-the-counter laxative     CALL THE OFFICE WITH ANY QUESTIONS OR CONCERNS: [phone]   VISIT OUR WEBSITE FOR ADDITIONAL INFORMATION: [email or web address]

## 2021-02-02 NOTE — Anesthesia Postprocedure Evaluation (Signed)
Anesthesia Post Note  Patient: Monique [name]  Procedure(s) Performed: OSTEOPHYTE EXCISION (Right Ankle) HARDWARE REMOVAL (Right Ankle)     Patient location during evaluation: PACU Anesthesia Type: General Level of consciousness: awake Pain management: pain level controlled Vital Signs Assessment: post-procedure vital signs reviewed and stable Respiratory status: spontaneous breathing, nonlabored ventilation, respiratory function stable and patient connected to nasal cannula oxygen Cardiovascular status: blood pressure returned to baseline and stable Postop Assessment: no apparent nausea or vomiting Anesthetic complications: no   No complications documented.  Last Vitals:  Vitals:   02/02/21 1015 02/02/21 1030  BP: (!) 98/50   Pulse: 64   Resp: 18   Temp:  36.6 C  SpO2: 100%     Last Pain:  Vitals:   02/02/21 1030  TempSrc:   PainSc: 0-No pain                 Patsie Mccardle P Namiyah Grantham

## 2021-02-02 NOTE — Brief Op Note (Signed)
[id number] 

## 2021-02-02 NOTE — Transfer of Care (Signed)
Immediate Anesthesia Transfer of Care Note  Patient: Monique [name]  Procedure(s) Performed: OSTEOPHYTE EXCISION (Right Ankle) HARDWARE REMOVAL (Right Ankle)  Patient Location: PACU  Anesthesia Type:General  Level of Consciousness: awake and alert   Airway & Oxygen Therapy: Patient Spontanous Breathing and Patient connected to nasal cannula oxygen  Post-op Assessment: Report given to RN and Post -op Vital signs reviewed and stable  Post vital signs: Reviewed and stable  Last Vitals:  Vitals Value Taken Time  BP 102/69 02/02/21 0949  Temp    Pulse 76 02/02/21 0956  Resp 16 02/02/21 0956  SpO2 99 % 02/02/21 0956  Vitals shown include unvalidated device data.  Last Pain:  Vitals:   02/02/21 0713  TempSrc:   PainSc: 6          Complications: No complications documented.

## 2021-02-02 NOTE — Anesthesia Procedure Notes (Signed)
Procedure Name: Intubation Date/Time: 02/02/2021 8:29 AM Performed by: Inda Coke, CRNA Pre-anesthesia Checklist: Patient identified, Emergency Drugs available, Suction available and Patient being monitored Patient Re-evaluated:Patient Re-evaluated prior to induction Oxygen Delivery Method: Circle System Utilized Preoxygenation: Pre-oxygenation with 100% oxygen Induction Type: IV induction Ventilation: Mask ventilation without difficulty Laryngoscope Size: Mac and 3 Grade View: Grade I Tube type: Oral Tube size: 7.0 mm Number of attempts: 1 Airway Equipment and Method: Stylet and Oral airway Placement Confirmation: ETT inserted through vocal cords under direct vision,  positive ETCO2 and breath sounds checked- equal and bilateral Secured at: 22 cm Tube secured with: Tape Dental Injury: Teeth and Oropharynx as per pre-operative assessment

## 2021-02-03 ENCOUNTER — Encounter (HOSPITAL_COMMUNITY): Payer: Self-pay | Admitting: Orthopedic Surgery

## 2021-02-03 NOTE — Op Note (Signed)
NAMESARAYAH, BACCHI MEDICAL RECORD NO: [id number] ACCOUNT NO: [id number] DATE OF BIRTH: 1986-01-10 FACILITY: [facility] LOCATION: [facility] PHYSICIAN: Astrid Divine. Marcelino Scot, MD  Operative Report   DATE OF PROCEDURE: 02/02/2021  PREOPERATIVE DIAGNOSIS:   1.  Left tibia posttraumatic exostosis. 2.  Left talus posttraumatic exostosis.  POSTOPERATIVE DIAGNOSES:   1.  Left tibia posttraumatic exostosis. 2.  Left talus posttraumatic exostosis.  PROCEDURE:   1.  Excision of left tibia exostosis. 2.  Excision of left talus exostosis.  SURGEON:  Dr. Altamese Schuyler.  ASSISTANT:  PA student.  ANESTHESIA:  General.  COMPLICATIONS:  None.  TOURNIQUET:  None.  Local 0.25% Marcaine with epinephrine.  DISPOSITION:  To PACU.  CONDITION:  Stable.  BRIEF HISTORY:  This is a very pleasant 35 year old female with bilateral pilon fractures treated with ORIF.  She has noted a mechanical block to extension that has progressively worsened and has been associated with pain.  Recently, she underwent CT  scan, which underwent a plain film evaluation as well as MRI confirming the presence of a large matching exostosis on both the tibia and the talus in the anterior aspect of her joint.  Furthermore showed a complete bridging bone bridge through her  syndesmosis and some broken hardware.  This was not tender or associated with her symptoms.  There was some preservation of the tibiotalar joint surface in spite of her significant trauma treated back in 2014.  I discussed with her the risks and benefits  of excision of these exostoses of both the tibia and the talus, which were recommended as opposed to just 1 and not the other and this option, which was perceived as a procedure to improve, but not eliminate her clinical complaints versus a more  significant intervention such as removal of all of our hardware and fusion.  Risk include again persistence of symptoms, nerve injury, vessel injury, infection, recurrence,  DVT, PE, and need for further surgery among others.  She provided consent to  proceed.  DESCRIPTION OF PROCEDURE:  The patient was taken to the operating room where general anesthesia was induced.  She did receive preoperative antibiotics.  The left lower extremity was prepped and draped in the usual sterile fashion with chlorhexidine wash,  Betadine scrub and paint.  A timeout was held.  After draping, a longitudinal incision was made over the anteromedial aspect of the ankle.  Dissection was carried down to the ankle joint, a C-arm was brought in to help identify the appropriate location  of the incision as well as to confirm the extent of her exostoses which were quite significant.  The quarter and 1/2-inch osteotome were used to osteotomize the distal tibia and find the anterior aspect of the plate.  The osteotome was then passed along  this from the medial side all the way to the lateral side and then carefully removed the osteotomized section of the tibia.  The abutment of these two exostoses was quite impressive clinically and there really looked like almost completely square  surfaces coming into contact.  I then turned my attention to the talus and here the larger half-inch osteotome was used to initiate a cut of the talus anteriorly and this was extended from medial to lateral, mobilizing a segment of bone that was over 2.5  cm in length and was close to a centimeter in depth as well.  Wound was irrigated thoroughly.  The joint loosely reapproximated with a 2-0 Prolene stitch of the fat pad to establish an additional layer  then inverted 2-0 for the subcutaneous and 2-0  nylon horizontal mattress for the skin.  Sterile gently compressive dressing was applied.  I did inject the area and the joint with 0.25% Marcaine using approximately 10 mL. The patient was taken to PACU in stable condition.  The PA student was present  and assisted me throughout.  PROGNOSIS:  The patient will ice, elevate and  have unrestricted range of motion of the ankle.  She can weightbear as tolerated in a Cam boot for support and we will plan to see her back for removal of sutures in 10-14 days.   SUJ D: 02/02/2021 7:12:20 pm T: 02/03/2021 6:30:00 am  JOB: [id number]/ [id number]

## 2021-02-07 ENCOUNTER — Encounter: Payer: Medicaid Other | Attending: Physical Medicine & Rehabilitation | Admitting: Registered Nurse

## 2021-02-07 ENCOUNTER — Other Ambulatory Visit: Payer: Self-pay

## 2021-02-07 DIAGNOSIS — S82891S Other fracture of right lower leg, sequela: Secondary | ICD-10-CM | POA: Diagnosis not present

## 2021-02-07 DIAGNOSIS — G894 Chronic pain syndrome: Secondary | ICD-10-CM

## 2021-02-07 DIAGNOSIS — Z5181 Encounter for therapeutic drug level monitoring: Secondary | ICD-10-CM

## 2021-02-07 DIAGNOSIS — M79671 Pain in right foot: Secondary | ICD-10-CM | POA: Diagnosis not present

## 2021-02-07 DIAGNOSIS — S82892S Other fracture of left lower leg, sequela: Secondary | ICD-10-CM

## 2021-02-07 DIAGNOSIS — F068 Other specified mental disorders due to known physiological condition: Secondary | ICD-10-CM | POA: Diagnosis not present

## 2021-02-07 DIAGNOSIS — S069X0S Unspecified intracranial injury without loss of consciousness, sequela: Secondary | ICD-10-CM

## 2021-02-07 DIAGNOSIS — Z79891 Long term (current) use of opiate analgesic: Secondary | ICD-10-CM

## 2021-02-07 MED ORDER — OXYCODONE HCL 10 MG PO TABS: 10.0000 mg | ORAL_TABLET | Freq: Two times a day (BID) | ORAL | 0 refills | Status: DC | PRN

## 2021-02-07 NOTE — Progress Notes (Signed)
Subjective:    Patient ID: Monique [name], female    DOB: May 14, 1986, 35 y.o.   MRN: [id number]  HPI: Monique [name] is a 35 y.o. female whose appointment was changed to a My-Chart Video Visit. Monique [name] asked for a My-Chart Video due to recent surgery  On 02/02/2021 by Dr Marcelino Scot.  OSTEOPHYTE EXCISION Right General  HARDWARE REMOVAL     Monique [name] agrees with My-Chart Video Visit and verbalizes understanding.   She states her pain is located in her right foot. She rates her pain 4. Her  current exercise regime is walking.  Monique [name] Morphine equivalent is 15.00 MME.  Last UDS was Performed on 12/08/2020, it was consistent.   Monique [name] CMA asked The Health and History Questions. This provider and Lysle Morales verified we were speaking with the correct person using two identifiers.   Pain Inventory Average Pain 8 Pain Right Now 4 My pain is sharp, stabbing and tingling  In the last 24 hours, has pain interfered with the following? General activity 7 Relation with others 7 Enjoyment of life 7 What TIME of day is your pain at its worst? morning , evening and night Sleep (in general) Fair  Pain is worse with: walking, bending and some activites Pain improves with: rest, heat/ice, pacing activities and medication Relief from Meds: 5  Family History  Problem Relation Age of Onset  . Diabetes Father   . Cancer Paternal Grandmother        Ovarian   Social History   Socioeconomic History  . Marital status: Single    Spouse name: Not on file  . Number of children: 3  . Years of education: Not on file  . Highest education level: Some college, no degree  Occupational History  . Occupation: Disability  Tobacco Use  . Smoking status: Passive Smoke Exposure - Never Smoker  . Smokeless tobacco: Never Used  Vaping Use  . Vaping Use: Never used  Substance and Sexual Activity  . Alcohol use: Yes    Comment: occasional  . Drug use: No  . Sexual activity: Yes     Birth control/protection: None  Other Topics Concern  . Not on file  Social History Narrative  . Not on file   Social Determinants of Health   Financial Resource Strain: Not on file  Food Insecurity: Not on file  Transportation Needs: Not on file  Physical Activity: Not on file  Stress: Not on file  Social Connections: Not on file   Past Surgical History:  Procedure Laterality Date  . APPENDECTOMY    . BONE EXCISION Right 02/02/2021   Procedure: OSTEOPHYTE EXCISION;  Surgeon: Altamese Leary, MD;  Location: [place];  Service: Orthopedics;  Laterality: Right;  . Ceasarean section  2007, 2010,2014  . EXTERNAL FIXATION LEG Bilateral 12/14/2012   Procedure: EXTERNAL FIXATION LEG;  Surgeon: [name], MD;  Location: [place];  Service: Orthopedics;  Laterality: Bilateral;  . EXTERNAL FIXATION REMOVAL Bilateral 12/23/2012   Procedure: REMOVAL EXTERNAL FIXATION LEG;  Surgeon: [name], MD;  Location: [place];  Service: Orthopedics;  Laterality: Bilateral;  . HARDWARE REMOVAL Right 02/02/2021   Procedure: HARDWARE REMOVAL;  Surgeon: Altamese Hammond, MD;  Location: [place];  Service: Orthopedics;  Laterality: Right;  . I & D EXTREMITY Left 12/14/2012   Procedure: IRRIGATION AND DEBRIDEMENT EXTREMITY;  Surgeon: [name], MD;  Location: [place];  Service: Orthopedics;  Laterality: Left;  . ORIF ANKLE FRACTURE Left 12/14/2012  Procedure: OPEN REDUCTION INTERNAL FIXATION (ORIF) ANKLE FRACTURE;  Surgeon: [name], MD;  Location: [place];  Service: Orthopedics;  Laterality: Left;  . ORIF TIBIA FRACTURE Bilateral 12/23/2012   Procedure: OPEN REDUCTION INTERNAL FIXATION (ORIF) BILATERAL DISTAL TIBIA FRACTURE;  Surgeon: [name], MD;  Location: [place];  Service: Orthopedics;  Laterality: Bilateral;   Past Surgical History:  Procedure Laterality Date  . APPENDECTOMY    . BONE EXCISION Right 02/02/2021   Procedure: OSTEOPHYTE EXCISION;  Surgeon: Altamese Parcoal, MD;  Location: [place];  Service:  Orthopedics;  Laterality: Right;  . Ceasarean section  2007, 2010,2014  . EXTERNAL FIXATION LEG Bilateral 12/14/2012   Procedure: EXTERNAL FIXATION LEG;  Surgeon: [name], MD;  Location: [place];  Service: Orthopedics;  Laterality: Bilateral;  . EXTERNAL FIXATION REMOVAL Bilateral 12/23/2012   Procedure: REMOVAL EXTERNAL FIXATION LEG;  Surgeon: [name], MD;  Location: [place];  Service: Orthopedics;  Laterality: Bilateral;  . HARDWARE REMOVAL Right 02/02/2021   Procedure: HARDWARE REMOVAL;  Surgeon: Altamese Decaturville, MD;  Location: [place];  Service: Orthopedics;  Laterality: Right;  . I & D EXTREMITY Left 12/14/2012   Procedure: IRRIGATION AND DEBRIDEMENT EXTREMITY;  Surgeon: [name], MD;  Location: [place];  Service: Orthopedics;  Laterality: Left;  . ORIF ANKLE FRACTURE Left 12/14/2012   Procedure: OPEN REDUCTION INTERNAL FIXATION (ORIF) ANKLE FRACTURE;  Surgeon: [name], MD;  Location: [place];  Service: Orthopedics;  Laterality: Left;  . ORIF TIBIA FRACTURE Bilateral 12/23/2012   Procedure: OPEN REDUCTION INTERNAL FIXATION (ORIF) BILATERAL DISTAL TIBIA FRACTURE;  Surgeon: [name], MD;  Location: [place];  Service: Orthopedics;  Laterality: Bilateral;   Past Medical History:  Diagnosis Date  . Anemia   . Anxiety   . Chronic pain syndrome   . Cognitive deficit as late effect of traumatic brain injury ([place])   . Depression   . Heart murmur   . Menorrhagia   . TBI (traumatic brain injury) ([place])    LMP 01/15/2021 Comment: Neg Preg Test Day of Surgery.  TRK   Opioid Risk Score:   Fall Risk Score:  `1  Depression screen PHQ 2/9  Depression screen [facility] 2/9 12/08/2020 10/20/2020 10/13/2020 08/12/2020 08/12/2020 06/02/2020 04/26/2020  Decreased Interest 0 0 0 0 1 2 0  Down, Depressed, Hopeless 0 0 0 0 0 0 0  PHQ - 2 Score 0 0 0 0 1 2 0  Altered sleeping - 1 - - 3 3 -  Tired, decreased energy - 3 - - 2 3 -  Change in appetite - 1 - - 2 1 -  Feeling bad or failure about yourself   - 0 - - 0 1 -  Trouble concentrating - 2 - - 1 2 -  Moving slowly or fidgety/restless - 0 - - 0 0 -  Suicidal thoughts - 0 - - 0 0 -  PHQ-9 Score - 7 - - 9 12 -    Review of Systems  Musculoskeletal:       Ankle and foot pain (both)  Neurological: Positive for weakness and numbness.       Tingling  All other systems reviewed and are negative.      Objective:   Physical Exam Vitals and nursing note reviewed.  Musculoskeletal:     Comments: No Physical Exam Performed: My-Chart Video Visit           Assessment & Plan:  1.TBI:Cognitive deficit:Continue to Monitor.02/07/2021.  Continue with current treatment:using the various devices to help with your Organization such as notes, calendars and using her cell phone to set reminders. 2. Chronic Pain Syndrome:Continue withslow weaning of Oxycodone.  Continue::Oxycodone 10 mg1tablet BIDas needed as needed #45.Refill for the following month e-scribed.  We will continue the opioid monitoring program, this consists of regular clinic visits, examinations, urine drug screen, pill counts as well as use of [place] Controlled Substance Reporting system. A 12 month History has been reviewed on the [place] Controlled Substance Reporting Systemon05/12/2020. 3. Bilateral Pilon Fractures: Continueto Monitor.02/07/2021 4. Tibial Nerve Injuries: Continue to monitor.02/07/2021 5. Migraine:No complaints today.Continue to monitor.02/07/2021 6. Right Foot Pain: S/P  On 02/02/2021 by Dr Marcelino Scot: Ortho Following.  OSTEOPHYTE EXCISION Right General  HARDWARE REMOVAL       F/U Visit in 2 months My- Chart Video Visit Established Patient Location of Patient: In Her Home Location of Provider: In the Office

## 2021-02-10 ENCOUNTER — Encounter: Payer: Self-pay | Admitting: Registered Nurse

## 2021-03-17 ENCOUNTER — Telehealth: Payer: Self-pay

## 2021-03-17 DIAGNOSIS — S82892S Other fracture of left lower leg, sequela: Secondary | ICD-10-CM

## 2021-03-17 DIAGNOSIS — G894 Chronic pain syndrome: Secondary | ICD-10-CM

## 2021-03-17 NOTE — Telephone Encounter (Signed)
Patient aware that Zella Ball NP is out of the office today 4:40 PM.   Patient called for a refill of Oxycodone 10 mg. She is aware that  it will be Monday before the RX is sent.  Due to Medicaid rules. Patient also reminded to call before Friday afternoon for refills.

## 2021-03-20 MED ORDER — OXYCODONE HCL 10 MG PO TABS: 10.0000 mg | ORAL_TABLET | Freq: Two times a day (BID) | ORAL | 0 refills | Status: DC | PRN

## 2021-03-20 NOTE — Telephone Encounter (Signed)
PMP was Reviewed: Oxycodone e-scribed today. This provider placed a call to Ms. Kurtzman, regarding the above, she verbalizes understanding.

## 2021-04-07 ENCOUNTER — Encounter: Payer: Medicaid Other | Admitting: Registered Nurse

## 2021-04-07 ENCOUNTER — Telehealth: Payer: Self-pay | Admitting: Registered Nurse

## 2021-04-07 ENCOUNTER — Encounter: Payer: Self-pay | Admitting: *Deleted

## 2021-04-07 DIAGNOSIS — S82891S Other fracture of right lower leg, sequela: Secondary | ICD-10-CM

## 2021-04-07 DIAGNOSIS — G894 Chronic pain syndrome: Secondary | ICD-10-CM

## 2021-04-07 MED ORDER — OXYCODONE HCL 10 MG PO TABS: 10.0000 mg | ORAL_TABLET | Freq: Two times a day (BID) | ORAL | 0 refills | Status: DC | PRN

## 2021-04-07 NOTE — Telephone Encounter (Signed)
Monique [name] has to take her daughter to the doctor this morning she has taken ill, she has rescheduled to next week but will be out of her meds but then.

## 2021-04-07 NOTE — Telephone Encounter (Signed)
PMP was Reviewed:  Oxycodone e-scribed today. We will send her a My-Chart regarding the above. Her appointment is scheduled for next week.

## 2021-04-07 NOTE — Addendum Note (Signed)
Addended by: Bayard Hugger on: 04/07/2021 09:53 AM   Modules accepted: Orders

## 2021-04-12 ENCOUNTER — Encounter: Payer: Medicaid Other | Attending: Physical Medicine & Rehabilitation | Admitting: Registered Nurse

## 2021-04-12 ENCOUNTER — Encounter: Payer: Self-pay | Admitting: Registered Nurse

## 2021-04-12 ENCOUNTER — Other Ambulatory Visit: Payer: Self-pay

## 2021-04-12 VITALS — BP 103/73 | HR 92 | Temp 98.7°F | Wt 203.0 lb

## 2021-04-12 DIAGNOSIS — S069X0S Unspecified intracranial injury without loss of consciousness, sequela: Secondary | ICD-10-CM | POA: Diagnosis present

## 2021-04-12 DIAGNOSIS — S82892S Other fracture of left lower leg, sequela: Secondary | ICD-10-CM | POA: Diagnosis present

## 2021-04-12 DIAGNOSIS — Z79891 Long term (current) use of opiate analgesic: Secondary | ICD-10-CM | POA: Insufficient documentation

## 2021-04-12 DIAGNOSIS — M79671 Pain in right foot: Secondary | ICD-10-CM | POA: Diagnosis present

## 2021-04-12 DIAGNOSIS — G894 Chronic pain syndrome: Secondary | ICD-10-CM | POA: Insufficient documentation

## 2021-04-12 DIAGNOSIS — F068 Other specified mental disorders due to known physiological condition: Secondary | ICD-10-CM | POA: Diagnosis present

## 2021-04-12 DIAGNOSIS — Z5181 Encounter for therapeutic drug level monitoring: Secondary | ICD-10-CM | POA: Diagnosis present

## 2021-04-12 DIAGNOSIS — S82891S Other fracture of right lower leg, sequela: Secondary | ICD-10-CM | POA: Insufficient documentation

## 2021-04-12 MED ORDER — OXYCODONE HCL 10 MG PO TABS: 10.0000 mg | ORAL_TABLET | Freq: Two times a day (BID) | ORAL | 0 refills | Status: DC | PRN

## 2021-04-12 MED ORDER — NAPROXEN 500 MG PO TABS: 500.0000 mg | ORAL_TABLET | Freq: Every day | ORAL | 1 refills | Status: DC | PRN

## 2021-04-12 NOTE — Progress Notes (Signed)
Subjective:    Patient ID: Monique [name], female    DOB: [birth year], 35 y.o.   MRN: [id number]  HPI: Monique [name] is a 35 y.o. female who returns for follow up appointment for chronic pain and medication refill. She states her  pain is located in  her bilateral feet. She rates her pain 4. Her current exercise regime is walking and performing stretching exercises.   Ms. Rosales Morphine equivalent is 22.50 MME.   Last UDS was Performed on 12/08/2020, it was consistent.    Pain Inventory Average Pain 7 Pain Right Now 4  My pain is constant, sharp, stabbing, and tingling  In the last 24 hours, has pain interfered with the following? General activity 7 Relation with others 7 Enjoyment of life 7 What TIME of day is your pain at its worst? evening and night Sleep (in general) Fair  Pain is worse with: walking, bending, and some activites Pain improves with: rest, pacing activities, medication, and ICE Relief from Meds: 8  Family History  Problem Relation Age of Onset   Diabetes Father    Cancer Paternal Grandmother        Ovarian   Social History   Socioeconomic History   Marital status: Single    Spouse name: Not on file   Number of children: 3   Years of education: Not on file   Highest education level: Some college, no degree  Occupational History   Occupation: Disability  Tobacco Use   Smoking status: Passive Smoke Exposure - Never Smoker   Smokeless tobacco: Never  Vaping Use   Vaping Use: Never used  Substance and Sexual Activity   Alcohol use: Yes    Comment: occasional   Drug use: No   Sexual activity: Yes    Birth control/protection: None  Other Topics Concern   Not on file  Social History Narrative   Not on file   Social Determinants of Health   Financial Resource Strain: Not on file  Food Insecurity: Not on file  Transportation Needs: Not on file  Physical Activity: Not on file  Stress: Not on file  Social Connections: Not on file    Past Surgical History:  Procedure Laterality Date   APPENDECTOMY     BONE EXCISION Right 02/02/2021   Procedure: OSTEOPHYTE EXCISION;  Surgeon: Altamese Dickinson, MD;  Location: [place];  Service: Orthopedics;  Laterality: Right;   Ceasarean section  2007, 2010,2014   EXTERNAL FIXATION LEG Bilateral 12/14/2012   Procedure: EXTERNAL FIXATION LEG;  Surgeon: [name], MD;  Location: [place];  Service: Orthopedics;  Laterality: Bilateral;   EXTERNAL FIXATION REMOVAL Bilateral 12/23/2012   Procedure: REMOVAL EXTERNAL FIXATION LEG;  Surgeon: [name], MD;  Location: [place];  Service: Orthopedics;  Laterality: Bilateral;   HARDWARE REMOVAL Right 02/02/2021   Procedure: HARDWARE REMOVAL;  Surgeon: Altamese [place]ville, MD;  Location: [place];  Service: Orthopedics;  Laterality: Right;   I & D EXTREMITY Left 12/14/2012   Procedure: IRRIGATION AND DEBRIDEMENT EXTREMITY;  Surgeon: [name], MD;  Location: [place];  Service: Orthopedics;  Laterality: Left;   ORIF ANKLE FRACTURE Left 12/14/2012   Procedure: OPEN REDUCTION INTERNAL FIXATION (ORIF) ANKLE FRACTURE;  Surgeon: [name], MD;  Location: [place];  Service: Orthopedics;  Laterality: Left;   ORIF TIBIA FRACTURE Bilateral 12/23/2012   Procedure: OPEN REDUCTION INTERNAL FIXATION (ORIF) BILATERAL DISTAL TIBIA FRACTURE;  Surgeon: [name], MD;  Location: [place];  Service: Orthopedics;  Laterality: Bilateral;   Past Surgical History:  Procedure Laterality Date   APPENDECTOMY     BONE EXCISION Right 02/02/2021   Procedure: OSTEOPHYTE EXCISION;  Surgeon: Altamese Berkley, MD;  Location: [place];  Service: Orthopedics;  Laterality: Right;   Ceasarean section  2007, 2010,2014   EXTERNAL FIXATION LEG Bilateral 12/14/2012   Procedure: EXTERNAL FIXATION LEG;  Surgeon: [name], MD;  Location: [place];  Service: Orthopedics;  Laterality: Bilateral;   EXTERNAL FIXATION REMOVAL Bilateral 12/23/2012   Procedure: REMOVAL EXTERNAL FIXATION LEG;  Surgeon:  [name], MD;  Location: [place];  Service: Orthopedics;  Laterality: Bilateral;   HARDWARE REMOVAL Right 02/02/2021   Procedure: HARDWARE REMOVAL;  Surgeon: Altamese Kalida, MD;  Location: [place];  Service: Orthopedics;  Laterality: Right;   I & D EXTREMITY Left 12/14/2012   Procedure: IRRIGATION AND DEBRIDEMENT EXTREMITY;  Surgeon: [name], MD;  Location: [place];  Service: Orthopedics;  Laterality: Left;   ORIF ANKLE FRACTURE Left 12/14/2012   Procedure: OPEN REDUCTION INTERNAL FIXATION (ORIF) ANKLE FRACTURE;  Surgeon: [name], MD;  Location: [place];  Service: Orthopedics;  Laterality: Left;   ORIF TIBIA FRACTURE Bilateral 12/23/2012   Procedure: OPEN REDUCTION INTERNAL FIXATION (ORIF) BILATERAL DISTAL TIBIA FRACTURE;  Surgeon: [name], MD;  Location: [place];  Service: Orthopedics;  Laterality: Bilateral;   Past Medical History:  Diagnosis Date   Anemia    Anxiety    Chronic pain syndrome    Cognitive deficit as late effect of traumatic brain injury ([place])    Depression    Heart murmur    Menorrhagia    TBI (traumatic brain injury) ([facility])    BP 103/73   Pulse 92   Temp 98.7 F (37.1 C)   Wt 203 lb (92.1 kg)   SpO2 95%   BMI 33.78 kg/m   Opioid Risk Score:   Fall Risk Score:  `1  Depression screen PHQ 2/9  Depression screen [facility] 2/9 12/08/2020 10/20/2020 10/13/2020 08/12/2020 08/12/2020 06/02/2020 04/26/2020  Decreased Interest 0 0 0 0 1 2 0  Down, Depressed, Hopeless 0 0 0 0 0 0 0  PHQ - 2 Score 0 0 0 0 1 2 0  Altered sleeping - 1 - - 3 3 -  Tired, decreased energy - 3 - - 2 3 -  Change in appetite - 1 - - 2 1 -  Feeling bad or failure about yourself  - 0 - - 0 1 -  Trouble concentrating - 2 - - 1 2 -  Moving slowly or fidgety/restless - 0 - - 0 0 -  Suicidal thoughts - 0 - - 0 0 -  PHQ-9 Score - 7 - - 9 12 -    Review of Systems  Musculoskeletal:  Positive for gait problem.       Pain in both ankles  All other systems reviewed and are negative.      Objective:   Physical Exam Vitals and nursing note reviewed.  Constitutional:      Appearance: Normal appearance.  Cardiovascular:     Rate and Rhythm: Normal rate and regular rhythm.     Pulses: Normal pulses.     Heart sounds: Normal heart sounds.  Pulmonary:     Effort: Pulmonary effort is normal.     Breath sounds: Normal breath sounds.  Musculoskeletal:     Cervical back: Normal range of motion and neck supple.     Comments: Normal Muscle  Bulk and Muscle Testing Reveals:  Upper Extremities: Full ROM and Muscle Strength 5/5  Lower Extremities : Full ROM and Muscle Strength 5/5 Arises from chair with ease Narrow Based Gait     Skin:    General: Skin is warm and dry.  Neurological:     Mental Status: She is alert and oriented to person, place, and time.  Psychiatric:        Mood and Affect: Mood normal.        Behavior: Behavior normal.         Assessment & Plan:  1.TBI:Cognitive deficit: Continue to Monitor. 04/12/2021.  Continue with current treatment: using the various devices to help with your Organization such as notes, calendars and using her cell phone to set reminders. 2. Chronic Pain Syndrome: Continue with  slow weaning of Oxycodone. Continue : : Oxycodone 10 mg 1 tablet BID  as needed as needed # 45. Refill for the following month e-scribed.  We will continue the opioid monitoring program, this consists of regular clinic visits, examinations, urine drug screen, pill counts as well as use of [place] Controlled Substance Reporting system. A 12 month History has been reviewed on the [place] Controlled Substance Reporting System on 04/12/2021. 3. Bilateral Pilon Fractures:  Continue to Monitor. 04/12/2021 4. Tibial Nerve Injuries: Continue to monitor. 04/12/2021 5. Migraine: No complaints today. Continue to monitor. 04/12/2021 6. Right Foot Pain: S/P  On 02/02/2021 by Dr Marcelino Scot: Ortho Following. OSTEOPHYTE EXCISION Right General  HARDWARE REMOVAL              F/U Visit in 2 months

## 2021-05-23 ENCOUNTER — Other Ambulatory Visit: Payer: Self-pay

## 2021-05-23 ENCOUNTER — Encounter (HOSPITAL_COMMUNITY): Payer: Self-pay | Admitting: Emergency Medicine

## 2021-05-23 ENCOUNTER — Emergency Department (HOSPITAL_COMMUNITY): Payer: Medicaid Other

## 2021-05-23 ENCOUNTER — Emergency Department (HOSPITAL_COMMUNITY)
Admission: EM | Admit: 2021-05-23 | Discharge: 2021-05-23 | Disposition: A | Discharge status: Home / Self Care | Payer: Medicaid Other | Attending: Emergency Medicine | Admitting: Emergency Medicine

## 2021-05-23 ENCOUNTER — Telehealth: Payer: Self-pay

## 2021-05-23 DIAGNOSIS — R42 Dizziness and giddiness: Secondary | ICD-10-CM | POA: Insufficient documentation

## 2021-05-23 DIAGNOSIS — Z7722 Contact with and (suspected) exposure to environmental tobacco smoke (acute) (chronic): Secondary | ICD-10-CM | POA: Diagnosis not present

## 2021-05-23 DIAGNOSIS — R112 Nausea with vomiting, unspecified: Secondary | ICD-10-CM | POA: Insufficient documentation

## 2021-05-23 DIAGNOSIS — H53149 Visual discomfort, unspecified: Secondary | ICD-10-CM | POA: Insufficient documentation

## 2021-05-23 DIAGNOSIS — R519 Headache, unspecified: Secondary | ICD-10-CM | POA: Insufficient documentation

## 2021-05-23 DIAGNOSIS — L910 Hypertrophic scar: Secondary | ICD-10-CM

## 2021-05-23 LAB — BASIC METABOLIC PANEL
Anion gap: 8 (ref 5–15)
BUN: 11 mg/dL (ref 6–20)
CO2: 21 mmol/L — ABNORMAL LOW (ref 22–32)
Calcium: 8.7 mg/dL — ABNORMAL LOW (ref 8.9–10.3)
Chloride: 105 mmol/L (ref 98–111)
Creatinine, Ser: 0.71 mg/dL (ref 0.44–1.00)
GFR, Estimated: >60 mL/min (ref >60)
Glucose, Bld: 150 mg/dL — ABNORMAL HIGH (ref 70–99)
Potassium: 4.2 mmol/L (ref 3.5–5.1)
Sodium: 134 mmol/L — ABNORMAL LOW (ref 135–145)

## 2021-05-23 LAB — I-STAT BETA HCG BLOOD, ED (MC, WL, AP ONLY): I-stat hCG, quantitative: <5 m[IU]/mL (ref <5)

## 2021-05-23 LAB — CBC
HCT: 32.5 % — ABNORMAL LOW (ref 36.0–46.0)
Hemoglobin: 9.7 g/dL — ABNORMAL LOW (ref 12.0–15.0)
MCH: 22 pg — ABNORMAL LOW (ref 26.0–34.0)
MCHC: 29.8 g/dL — ABNORMAL LOW (ref 30.0–36.0)
MCV: 73.7 fL — ABNORMAL LOW (ref 80.0–100.0)
Platelets: 340 10*3/uL (ref 150–400)
RBC: 4.41 MIL/uL (ref 3.87–5.11)
RDW: 18.3 % — ABNORMAL HIGH (ref 11.5–15.5)
WBC: 6.1 10*3/uL (ref 4.0–10.5)
nRBC: 0 % (ref 0.0–0.2)

## 2021-05-23 MED ORDER — ONDANSETRON 4 MG PO TBDP: 4.0000 mg | ORAL_TABLET | Freq: Three times a day (TID) | ORAL | 0 refills | Status: DC | PRN

## 2021-05-23 MED ORDER — DIPHENHYDRAMINE HCL 50 MG/ML IJ SOLN
25.0000 mg | Freq: Once | INTRAMUSCULAR | Status: AC
  Administered 2021-05-23: 25 mg via INTRAVENOUS
  Filled 2021-05-23: qty 1

## 2021-05-23 MED ORDER — SODIUM CHLORIDE 0.9 % IV BOLUS
1000.0000 mL | Freq: Once | INTRAVENOUS | Status: AC
  Administered 2021-05-23: 1000 mL via INTRAVENOUS

## 2021-05-23 MED ORDER — METOCLOPRAMIDE HCL 5 MG/ML IJ SOLN
10.0000 mg | INTRAMUSCULAR | Status: AC
  Administered 2021-05-23: 10 mg via INTRAVENOUS
  Filled 2021-05-23: qty 2

## 2021-05-23 MED ORDER — ONDANSETRON 4 MG PO TBDP
4.0000 mg | ORAL_TABLET | Freq: Once | ORAL | Status: DC
  Filled 2021-05-23: qty 1

## 2021-05-23 NOTE — Addendum Note (Signed)
Addended by: Karle Plumber B on: 05/23/2021 08:52 PM   Modules accepted: Orders

## 2021-05-23 NOTE — ED Triage Notes (Signed)
Pt c/o a "migraine" and emesis/dizziness that started last night.  Pt has hx of migraines.
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
## 2021-05-23 NOTE — Discharge Instructions (Addendum)
Continue tylenol or motrin for headache.  If severe, can try excedrin. Can use zofran as needed for nausea.  Make sure to rest and stay hydrated. Follow-up with your primary care doctor. Return here for new concerns.
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
## 2021-05-23 NOTE — ED Notes (Signed)
Patient verbalizes understanding of discharge instructions. Opportunity for questioning and answers were provided. Pt discharged from ED. 

## 2021-05-23 NOTE — Telephone Encounter (Signed)
Copied from [place] [id number]. Topic: Referral - Request for Referral >> May 23, 2021 12:40 PM Tessa Lerner A wrote: Has patient seen PCP for this complaint? Yes.    *If NO, is insurance requiring patient see PCP for this issue before PCP can refer them?  Referral for which specialty: Dermatology   Preferred provider/office: Patient has no preference   Reason for referral: Keloid concerns

## 2021-05-24 NOTE — Telephone Encounter (Signed)
Called pt made aware of Md message.

## 2021-06-09 ENCOUNTER — Encounter: Payer: Medicaid Other | Admitting: Registered Nurse

## 2021-06-20 ENCOUNTER — Encounter: Payer: Self-pay | Admitting: Registered Nurse

## 2021-06-20 ENCOUNTER — Encounter: Payer: Medicaid Other | Attending: Physical Medicine & Rehabilitation | Admitting: Registered Nurse

## 2021-06-20 ENCOUNTER — Other Ambulatory Visit: Payer: Self-pay

## 2021-06-20 VITALS — BP 103/73 | Ht 65.0 in | Wt 203.0 lb

## 2021-06-20 DIAGNOSIS — S82891S Other fracture of right lower leg, sequela: Secondary | ICD-10-CM | POA: Insufficient documentation

## 2021-06-20 DIAGNOSIS — F068 Other specified mental disorders due to known physiological condition: Secondary | ICD-10-CM | POA: Diagnosis not present

## 2021-06-20 DIAGNOSIS — S82892S Other fracture of left lower leg, sequela: Secondary | ICD-10-CM | POA: Insufficient documentation

## 2021-06-20 DIAGNOSIS — Z5181 Encounter for therapeutic drug level monitoring: Secondary | ICD-10-CM | POA: Diagnosis not present

## 2021-06-20 DIAGNOSIS — S069X0S Unspecified intracranial injury without loss of consciousness, sequela: Secondary | ICD-10-CM | POA: Insufficient documentation

## 2021-06-20 DIAGNOSIS — Z79891 Long term (current) use of opiate analgesic: Secondary | ICD-10-CM | POA: Insufficient documentation

## 2021-06-20 DIAGNOSIS — G894 Chronic pain syndrome: Secondary | ICD-10-CM | POA: Insufficient documentation

## 2021-06-20 DIAGNOSIS — M542 Cervicalgia: Secondary | ICD-10-CM | POA: Insufficient documentation

## 2021-06-20 MED ORDER — OXYCODONE HCL 10 MG PO TABS: 10.0000 mg | ORAL_TABLET | Freq: Two times a day (BID) | ORAL | 0 refills | Status: DC | PRN

## 2021-06-20 NOTE — Progress Notes (Signed)
Subjective:    Patient ID: Monique [name], female    DOB: [birth year], 35 y.o.   MRN: [id number]  HPI: Monique [name] is a 35 y.o. female whose  appointment was changed to a My-Chart Video Visit, she reports her daughter had COVID symptoms and she will be taking her daughter to the doctor. Her appointment was changed to a My-Chart Video visit, she agree with My-Chart Video visit and verbalizes understanding. She  states her pain is located in her neck and bilateral ankles. She rates her pain 5. Her current exercise regime is walking and performing stretching exercises.  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 Monique [name] will send the name of the Facility she is working at and this provider will give her a letter,  to assist in her not working in a lock unit, she verbalizes understanding and she is very Patent attorney.   Monique [name] is 30.68  MME.   Last UDS was Performed on 12/08/2020, it was consistent.      Pain Inventory Average Pain 7 Pain Right Now 5 My pain is intermittent, sharp, stabbing, and aching  In the last 24 hours, has pain interfered with the following? General activity 8 Relation with others 7 Enjoyment of life 7 What TIME of day is your pain at its worst? evening and night Sleep (in general) Poor  Pain is worse with: walking, bending, sitting, and some activites Pain improves with: rest, heat/ice, and medication Relief from Meds: 7  Family History  Problem Relation Age of Onset   Diabetes Father    Cancer Paternal Grandmother        Ovarian   Social History   Socioeconomic History   Marital status: Single    Spouse name: Not on file   Number of children: 3   Years of education: Not on file   Highest education level: Some college, no degree  Occupational History   Occupation: Disability  Tobacco Use    Smoking status: Passive Smoke Exposure - Never Smoker   Smokeless tobacco: Never  Vaping Use   Vaping Use: Never used  Substance and Sexual Activity   Alcohol use: Yes    Comment: occasional   Drug use: No   Sexual activity: Yes    Birth control/protection: None  Other Topics Concern   Not on file  Social History Narrative   Not on file   Social Determinants of Health   Financial Resource Strain: Not on file  Food Insecurity: Not on file  Transportation Needs: Not on file  Physical Activity: Not on file  Stress: Not on file  Social Connections: Not on file   Past Surgical History:  Procedure Laterality Date   APPENDECTOMY     BONE EXCISION Right 02/02/2021   Procedure: OSTEOPHYTE EXCISION;  Surgeon: Altamese [place], MD;  Location: [place];  Service: Orthopedics;  Laterality: Right;   Ceasarean section  2007, 2010,2014   EXTERNAL FIXATION LEG Bilateral 12/14/2012   Procedure: EXTERNAL FIXATION LEG;  Surgeon: [name], MD;  Location: [place];  Service: Orthopedics;  Laterality: Bilateral;   EXTERNAL FIXATION REMOVAL Bilateral 12/23/2012   Procedure: REMOVAL EXTERNAL FIXATION LEG;  Surgeon: [name], MD;  Location: [place];  Service: Orthopedics;  Laterality: Bilateral;   HARDWARE REMOVAL Right 02/02/2021   Procedure: HARDWARE REMOVAL;  Surgeon: Altamese [place],  MD;  Location: [place];  Service: Orthopedics;  Laterality: Right;   I & D EXTREMITY Left 12/14/2012   Procedure: IRRIGATION AND DEBRIDEMENT EXTREMITY;  Surgeon: [name], MD;  Location: [place];  Service: Orthopedics;  Laterality: Left;   ORIF ANKLE FRACTURE Left 12/14/2012   Procedure: OPEN REDUCTION INTERNAL FIXATION (ORIF) ANKLE FRACTURE;  Surgeon: [name], MD;  Location: [place];  Service: Orthopedics;  Laterality: Left;   ORIF TIBIA FRACTURE Bilateral 12/23/2012   Procedure: OPEN REDUCTION INTERNAL FIXATION (ORIF) BILATERAL DISTAL TIBIA FRACTURE;  Surgeon: [name], MD;  Location: [place];  Service:  Orthopedics;  Laterality: Bilateral;   Past Surgical History:  Procedure Laterality Date   APPENDECTOMY     BONE EXCISION Right 02/02/2021   Procedure: OSTEOPHYTE EXCISION;  Surgeon: Altamese [place], MD;  Location: [place];  Service: Orthopedics;  Laterality: Right;   Ceasarean section  2007, 2010,2014   EXTERNAL FIXATION LEG Bilateral 12/14/2012   Procedure: EXTERNAL FIXATION LEG;  Surgeon: [name], MD;  Location: [place];  Service: Orthopedics;  Laterality: Bilateral;   EXTERNAL FIXATION REMOVAL Bilateral 12/23/2012   Procedure: REMOVAL EXTERNAL FIXATION LEG;  Surgeon: [name], MD;  Location: [place];  Service: Orthopedics;  Laterality: Bilateral;   HARDWARE REMOVAL Right 02/02/2021   Procedure: HARDWARE REMOVAL;  Surgeon: Altamese [place], MD;  Location: [place];  Service: Orthopedics;  Laterality: Right;   I & D EXTREMITY Left 12/14/2012   Procedure: IRRIGATION AND DEBRIDEMENT EXTREMITY;  Surgeon: [name], MD;  Location: [place];  Service: Orthopedics;  Laterality: Left;   ORIF ANKLE FRACTURE Left 12/14/2012   Procedure: OPEN REDUCTION INTERNAL FIXATION (ORIF) ANKLE FRACTURE;  Surgeon: [name], MD;  Location: [place];  Service: Orthopedics;  Laterality: Left;   ORIF TIBIA FRACTURE Bilateral 12/23/2012   Procedure: OPEN REDUCTION INTERNAL FIXATION (ORIF) BILATERAL DISTAL TIBIA FRACTURE;  Surgeon: [name], MD;  Location: [place];  Service: Orthopedics;  Laterality: Bilateral;   Past Medical History:  Diagnosis Date   Anemia    Anxiety    Chronic pain syndrome    Cognitive deficit as late effect of traumatic brain injury ([place])    Depression    Heart murmur    Menorrhagia    TBI (traumatic brain injury) ([place])    There were no vitals taken for this visit.  Opioid Risk Score:   Fall Risk Score:  `1  Depression screen PHQ 2/9  Depression screen [facility] 2/9 04/12/2021 12/08/2020 10/20/2020 10/13/2020 08/12/2020 08/12/2020 06/02/2020  Decreased Interest 0 0 0 0 0 1 2  Down, Depressed,  Hopeless 0 0 0 0 0 0 0  PHQ - 2 Score 0 0 0 0 0 1 2  Altered sleeping - - 1 - - 3 3  Tired, decreased energy - - 3 - - 2 3  Change in appetite - - 1 - - 2 1  Feeling bad or failure about yourself  - - 0 - - 0 1  Trouble concentrating - - 2 - - 1 2  Moving slowly or fidgety/restless - - 0 - - 0 0  Suicidal thoughts - - 0 - - 0 0  PHQ-9 Score - - 7 - - 9 12    Review of Systems  Musculoskeletal:  Positive for neck pain.       Both ankles, right heel  All other systems reviewed and are negative.     Objective:   Physical Exam Vitals  and nursing note reviewed.  Musculoskeletal:     Comments: No Physical Exam: My-Chart Video Visit         Assessment & Plan:  1.TBI:Cognitive deficit: Continue to Monitor. 06/20/2021.  Continue with current treatment: using the various devices to help with your Organization such as notes, calendars and using her cell phone to set reminders. 2. Chronic Pain Syndrome: Continue with  slow weaning of Oxycodone. Refilled : Oxycodone 10 mg 1 tablet BID  as needed as needed # 45. Refill for the following month e-scribed.  We will continue the opioid monitoring program, this consists of regular clinic visits, examinations, urine drug screen, pill counts as well as use of [place] Controlled Substance Reporting system. A 12 month History has been reviewed on the [place] on 06/20/2021. 3. Bilateral Pilon Fractures:  Continue to Monitor. 06/20/2021 4. Tibial Nerve Injuries: Continue to monitor. 06/20/2021 5. Migraine: No complaints today. Continue to monitor. 06/20/2021 6. Right Foot Pain: S/P  On 02/02/2021 by Dr Marcelino Scot: Ortho Following. OSTEOPHYTE EXCISION Right General  HARDWARE REMOVAL        7. Cervicalgia: Continue HEP as Tolerated. Continue to Monitor.    F/U in 2 Months   My-Chart Video Visit Established Patient Location of Patient: At [place], she reports she took her daughter to the Dr. And  her daughter was negative for COVID Location of Provider: In the Office

## 2021-07-06 ENCOUNTER — Telehealth: Payer: Self-pay | Admitting: Registered Nurse

## 2021-07-06 NOTE — Telephone Encounter (Signed)
Medication was sent to patients pharmacy and it needs a prior auth--please call patient.

## 2021-07-07 NOTE — Telephone Encounter (Signed)
Patient needs PA on Oxycodone. PA has been submitted to insurance.

## 2021-07-13 ENCOUNTER — Encounter: Payer: Self-pay | Admitting: *Deleted

## 2021-07-18 ENCOUNTER — Telehealth: Payer: Self-pay | Admitting: Psychiatry

## 2021-07-18 ENCOUNTER — Ambulatory Visit: Payer: Medicaid Other | Admitting: Psychiatry

## 2021-07-18 ENCOUNTER — Other Ambulatory Visit: Payer: Self-pay

## 2021-07-18 ENCOUNTER — Encounter: Payer: Self-pay | Admitting: Psychiatry

## 2021-07-18 VITALS — BP 109/71 | HR 71 | Ht 65.0 in | Wt 202.0 lb

## 2021-07-18 DIAGNOSIS — M5410 Radiculopathy, site unspecified: Secondary | ICD-10-CM

## 2021-07-18 DIAGNOSIS — R519 Headache, unspecified: Secondary | ICD-10-CM

## 2021-07-18 DIAGNOSIS — G43009 Migraine without aura, not intractable, without status migrainosus: Secondary | ICD-10-CM | POA: Diagnosis not present

## 2021-07-18 DIAGNOSIS — M542 Cervicalgia: Secondary | ICD-10-CM

## 2021-07-18 MED ORDER — ELETRIPTAN HYDROBROMIDE 40 MG PO TABS: 40.0000 mg | ORAL_TABLET | ORAL | 3 refills | Status: DC | PRN

## 2021-07-18 NOTE — Telephone Encounter (Signed)
medicaid order sent to GI, [facility] they will reach out to the patient to schedule.
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
## 2021-07-18 NOTE — Patient Instructions (Addendum)
MRI brain and neck Physical therapy for the neck Start Relpax (eletriptan) 40 mg as needed. Take at the onset of migraine. If headache recurs or does not fully resolve, you may take a second dose after 2 hours. Please avoid taking more than 2 days per week or 10 days per month.   GENERAL HEADACHE INSTRUCTIONS Headache Preventive Treatment: Please keep in mind that it takes 4-6 weeks for the medication to start working well and 2-3 months at the appropriate dose before deciding if it will be useful or not. If it is not helping at all by this time, then we will discuss other medications to try. Supplements may take 3-6 months until you see full effect.   Natural supplements: Magnesium Oxide or Magnesium Glycinate 500 mg at bed (up to 800 mg daily) Coenzyme Q10 300 mg in AM Vitamin B2- 200 mg twice a day  Add 1 supplement at a time since even natural supplements can have undesirable side effects. You can sometimes buy supplements cheaper (especially Coenzyme Q10) at www.https://compton-perez.com/ or at [employer].  Vitamins and herbs that show potential:  Magnesium: Magnesium (250 mg twice a day or 500 mg at bed) has a relaxant effect on smooth muscles such as blood vessels. Individuals suffering from frequent or daily headache usually have low magnesium levels which can be increase with daily supplementation of 400-750 mg. Three trials found 40-90% average headache reduction  when used as a preventative. Magnesium also demonstrated the benefit in menstrually related migraine.  Magnesium is part of the messenger system in the serotonin cascade and it is a good muscle relaxant.  It is also useful for constipation which can be a side effect of other medications used to treat migraine. Good sources include nuts, whole grains, and tomatoes. Side Effects: loose stool/diarrhea Riboflavin (vitamin B 2) 200 mg twice a day. This vitamin assists nerve cells in the production of ATP a principal energy storing molecule.  It is  necessary for many chemical reactions in the body.  There have been at least 3 clinical trials of riboflavin using 400 mg per day all of which suggested that migraine frequency can be decreased.  All 3 trials showed significant improvement in over half of migraine sufferers.  The supplement is found in bread, cereal, milk, meat, and poultry.  Most Americans get more riboflavin than the recommended daily allowance, however riboflavin deficiency is not necessary for the supplements to help prevent headache. Side effects: energizing, green urine  Coenzyme Q10: This is present in almost all cells in the body and is critical component for the conversion of energy.  Recent studies have shown that a nutritional supplement of CoQ10 can reduce the frequency of migraine attacks by improving the energy production of cells as with riboflavin.  Doses of 150 mg twice a day have been shown to be effective.  Melatonin: Increasing evidence shows correlation between melatonin secretion and headache conditions.  Melatonin supplementation has decreased headache intensity and duration.  It is widely used as a sleep aid.  Sleep is natures way of dealing with migraine.  A dose of 3 mg is recommended to start for headaches including cluster headache. Higher doses up to 15 mg has been reviewed for use in Cluster headache and have been used. The rationale behind using melatonin for cluster is that many theories regarding the cause of Cluster headache center around the disruption of the normal circadian rhythm in the brain.  This helps restore the normal circadian rhythm.  Ginger: Ginger  has a small amount of antihistamine and anti-inflammatory action which may help headache.  It is primarily used for nausea and may aid in the absorption of other medications. HEADACHE DIET: Foods and beverages which may trigger migraine Note that only 20% of headache patients are food sensitive. You will know if you are food sensitive if you get a  headache consistently 20 minutes to 2 hours after eating a certain food. Only cut out a food if it causes headaches, otherwise you might remove foods you enjoy! What matters most for diet is to eat a well balanced healthy diet full of vegetables and low fat protein, and to not miss meals.  Chocolate, other sweets ALL cheeses except cottage and cream cheese Dairy products, yogurt, sour cream, ice cream Liver Meat extracts (Bovril, Marmite, meat tenderizers) Meats or fish which have undergone aging, fermenting, pickling or smoking. These include: Hotdogs,salami,Lox,sausage, mortadellas,smoked salmon, pepperoni, Pickled herring Pods of broad bean (English beans, Chinese pea pods, [place] (fava) beans, lima and navy beans Ripe avocado, ripe banana Yeast extracts or active yeast preparations such as Brewer's or Fleishman's (commercial bakes goods are permitted) Tomato based foods, pizza (lasagna, etc.)  MSG (monosodium glutamate) is disguised as many things; look for these common aliases: Monopotassium glutamate Autolysed yeast Hydrolysed protein Sodium caseinate "flavorings" "all natural preservatives" Nutrasweet  Avoid all other foods that convincingly provoke headaches.  Resources: The Dizzy Lu Duffel Your Headache Diet, migrainestrong.com  https://www.aguirre.org/  Caffeine and Migraine For patients that have migraine, caffeine intake more than 3 days per week can lead to dependency and increased migraine frequency. I would recommend cutting back on your caffeine intake as best you can. The recommended amount of caffeine is 200-300 mg daily, although migraine patients may experience dependency at even lower doses. While you may notice an increase in headache temporarily, cutting back will be helpful for headaches in the long run. For more information on caffeine and migraine, visit:  https://americanmigrainefoundation.org/resource-library/caffeine-and-migraine/  Headache Prevention Strategies:  1. Maintain a headache diary; learn to identify and avoid triggers.  - This can be a simple note where you log when you had a headache, associated symptoms, and medications used - There are several smartphone apps developed to help track migraines: Migraine Buddy, Migraine Monitor, Curelator N1-Headache App  Common triggers include: Emotional triggers: Emotional/Upset family or friends Emotional/Upset occupation Business reversal/success Anticipation anxiety Crisis-serious Post-crisis periodNew job/position   Physical triggers: Vacation Day Weekend Strenuous Exercise High Altitude Location New Move Menstrual Day Physical Illness Oversleep/Not enough sleep Weather changes Light: Photophobia or light sesnitivity treatment involves a balance between desensitization and reduction in overly strong input. Use dark polarized glasses outside, but not inside. Avoid bright or fluorescent light, but do not dim environment to the point that going into a normally lit room hurts. Consider FL-41 tint lenses, which reduce the most irritating wavelengths without blocking too much light.  These can be obtained at axonoptics.com or theraspecs.com Foods: see list above.  2. Limit use of acute treatments (over-the-counter medications, triptans, etc.) to no more than 2 days per week or 10 days per month to prevent medication overuse headache (rebound headache).    3. Follow a regular schedule (including weekends and holidays): Don't skip meals. Eat a balanced diet. 8 hours of sleep nightly. Minimize stress. Exercise 30 minutes per day. Being overweight is associated with a 5 times increased risk of chronic migraine. Keep well hydrated and drink 6-8 glasses of water per day.  4. Initiate non-pharmacologic measures at the  earliest onset of your headache. Rest and quiet environment. Relax  and reduce stress. Breathe2Relax is a free app that can instruct you on    some simple relaxtion and breathing techniques. Http://Dawnbuse.com is a    free website that provides teaching videos on relaxation.  Also, there are  many apps that   can be downloaded for "mindful" relaxation.  An app called YOGA NIDRA will help walk you through mindfulness. Another app called Calm can be downloaded to give you a structured mindfulness guide with daily reminders and skill development. Headspace for guided meditation Mindfulness Based Stress Reduction Online Course: www.palousemindfulness.com Cold compresses.  5. Don't wait!! Take the maximum allowable dosage of prescribed medication at the first sign of migraine.  6. Compliance:  Take prescribed medication regularly as directed and at the first sign of a migraine.  7. Communicate:  Call your physician when problems arise, especially if your headaches change, increase in frequency/severity, or become associated with neurological symptoms (weakness, numbness, slurred speech, etc.).  8. Headache/pain management therapies: Consider various complementary methods, including medication, behavioral therapy, psychological counselling, biofeedback, massage therapy, acupuncture, dry needling, and other modalities.  Such measures may reduce the need for medications. Counseling for pain management, where patients learn to function and ignore/minimize their pain, seems to work very well.  9. Recommend changing family's attention and focus away from patient's headaches. Instead, emphasize daily activities. If first question of day is 'How are your headaches/Do you have a headache today?', then patient will constantly think about headaches, thus making them worse. Goal is to re-direct attention away from headaches, toward daily activities and other distractions.  10. Helpful  Websites: www.AmericanHeadacheSociety.org VoipObserver.it www.headaches.org GolfingFamily.no www.achenet.org

## 2021-07-25 ENCOUNTER — Ambulatory Visit: Payer: Medicaid Other | Attending: Internal Medicine | Admitting: Internal Medicine

## 2021-07-25 ENCOUNTER — Encounter: Payer: Self-pay | Admitting: Internal Medicine

## 2021-07-25 ENCOUNTER — Other Ambulatory Visit: Payer: Self-pay

## 2021-07-25 VITALS — BP 102/71 | HR 69 | Resp 16 | Wt 202.0 lb

## 2021-07-25 DIAGNOSIS — Z791 Long term (current) use of non-steroidal anti-inflammatories (NSAID): Secondary | ICD-10-CM | POA: Diagnosis not present

## 2021-07-25 DIAGNOSIS — Z2821 Immunization not carried out because of patient refusal: Secondary | ICD-10-CM

## 2021-07-25 DIAGNOSIS — Z6833 Body mass index (BMI) 33.0-33.9, adult: Secondary | ICD-10-CM | POA: Insufficient documentation

## 2021-07-25 DIAGNOSIS — Z713 Dietary counseling and surveillance: Secondary | ICD-10-CM | POA: Insufficient documentation

## 2021-07-25 DIAGNOSIS — G43009 Migraine without aura, not intractable, without status migrainosus: Secondary | ICD-10-CM | POA: Diagnosis not present

## 2021-07-25 DIAGNOSIS — E669 Obesity, unspecified: Secondary | ICD-10-CM | POA: Diagnosis not present

## 2021-07-25 DIAGNOSIS — N920 Excessive and frequent menstruation with regular cycle: Secondary | ICD-10-CM | POA: Diagnosis not present

## 2021-07-25 DIAGNOSIS — D509 Iron deficiency anemia, unspecified: Secondary | ICD-10-CM | POA: Insufficient documentation

## 2021-07-25 DIAGNOSIS — Z7722 Contact with and (suspected) exposure to environmental tobacco smoke (acute) (chronic): Secondary | ICD-10-CM | POA: Insufficient documentation

## 2021-07-25 DIAGNOSIS — Z793 Long term (current) use of hormonal contraceptives: Secondary | ICD-10-CM | POA: Insufficient documentation

## 2021-07-25 DIAGNOSIS — N921 Excessive and frequent menstruation with irregular cycle: Secondary | ICD-10-CM

## 2021-07-25 DIAGNOSIS — Z9049 Acquired absence of other specified parts of digestive tract: Secondary | ICD-10-CM | POA: Diagnosis not present

## 2021-07-25 MED ORDER — FERROUS SULFATE 325 (65 FE) MG PO TABS: 325.0000 mg | ORAL_TABLET | Freq: Every day | ORAL | 1 refills | Status: DC
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
## 2021-07-25 NOTE — Patient Instructions (Signed)

## 2021-07-31 ENCOUNTER — Telehealth: Payer: Self-pay

## 2021-07-31 NOTE — Telephone Encounter (Signed)
PA for Eltriptan has been completed and placed on Dr. Georgina Peer desk for signature.

## 2021-08-03 ENCOUNTER — Ambulatory Visit
Admission: RE | Admit: 2021-08-03 | Discharge: 2021-08-03 | Disposition: A | Discharge status: Home / Self Care | Payer: Medicaid Other | Source: Ambulatory Visit | Attending: Psychiatry | Admitting: Psychiatry

## 2021-08-03 ENCOUNTER — Other Ambulatory Visit: Payer: Self-pay | Admitting: Psychiatry

## 2021-08-03 DIAGNOSIS — R519 Headache, unspecified: Secondary | ICD-10-CM | POA: Diagnosis not present

## 2021-08-03 DIAGNOSIS — M5410 Radiculopathy, site unspecified: Secondary | ICD-10-CM | POA: Diagnosis not present

## 2021-08-03 MED ORDER — GADOBENATE DIMEGLUMINE 529 MG/ML IV SOLN
19.0000 mL | Freq: Once | INTRAVENOUS | Status: AC | PRN
  Administered 2021-08-03: 20 mL via INTRAVENOUS

## 2021-08-03 MED ORDER — RIZATRIPTAN BENZOATE 10 MG PO TBDP: 10.0000 mg | ORAL_TABLET | ORAL | 3 refills | Status: DC | PRN

## 2021-08-03 NOTE — Telephone Encounter (Signed)
Rx for rizatriptan sent to her pharmacy, thanks

## 2021-08-03 NOTE — Telephone Encounter (Signed)
PA for Eltriptan has been denied through [facility] [employer].  I called [facility] [employer] ( ref # [id number]) Med has been denied due to pt not trying/failing sumatriptan or rizatriptan.  These are the preferred for the drug plan.

## 2021-08-17 ENCOUNTER — Encounter: Payer: Self-pay | Admitting: Registered Nurse

## 2021-08-17 ENCOUNTER — Encounter: Payer: Medicaid Other | Attending: Physical Medicine & Rehabilitation | Admitting: Registered Nurse

## 2021-08-17 ENCOUNTER — Ambulatory Visit: Payer: Medicaid Other | Admitting: Registered Nurse

## 2021-08-17 ENCOUNTER — Other Ambulatory Visit: Payer: Self-pay

## 2021-08-17 VITALS — BP 118/74 | HR 69 | Ht 65.0 in | Wt 200.0 lb

## 2021-08-17 DIAGNOSIS — M25512 Pain in left shoulder: Secondary | ICD-10-CM | POA: Insufficient documentation

## 2021-08-17 DIAGNOSIS — G894 Chronic pain syndrome: Secondary | ICD-10-CM | POA: Diagnosis not present

## 2021-08-17 DIAGNOSIS — M545 Low back pain, unspecified: Secondary | ICD-10-CM | POA: Diagnosis not present

## 2021-08-17 DIAGNOSIS — F068 Other specified mental disorders due to known physiological condition: Secondary | ICD-10-CM | POA: Insufficient documentation

## 2021-08-17 DIAGNOSIS — Z5181 Encounter for therapeutic drug level monitoring: Secondary | ICD-10-CM | POA: Diagnosis present

## 2021-08-17 DIAGNOSIS — Z79891 Long term (current) use of opiate analgesic: Secondary | ICD-10-CM | POA: Diagnosis present

## 2021-08-17 DIAGNOSIS — S069X0S Unspecified intracranial injury without loss of consciousness, sequela: Secondary | ICD-10-CM | POA: Insufficient documentation

## 2021-08-17 DIAGNOSIS — G8929 Other chronic pain: Secondary | ICD-10-CM | POA: Diagnosis present

## 2021-08-17 DIAGNOSIS — S82891S Other fracture of right lower leg, sequela: Secondary | ICD-10-CM | POA: Diagnosis not present

## 2021-08-17 DIAGNOSIS — S069XAS Unspecified intracranial injury with loss of consciousness status unknown, sequela: Secondary | ICD-10-CM

## 2021-08-17 DIAGNOSIS — M542 Cervicalgia: Secondary | ICD-10-CM | POA: Diagnosis present

## 2021-08-17 DIAGNOSIS — M25511 Pain in right shoulder: Secondary | ICD-10-CM | POA: Insufficient documentation

## 2021-08-17 DIAGNOSIS — S82892S Other fracture of left lower leg, sequela: Secondary | ICD-10-CM | POA: Diagnosis present

## 2021-08-17 MED ORDER — OXYCODONE HCL 10 MG PO TABS: 10.0000 mg | ORAL_TABLET | Freq: Two times a day (BID) | ORAL | 0 refills | Status: DC | PRN

## 2021-08-17 NOTE — Progress Notes (Signed)
Subjective:    Patient ID: Monique [name], female    DOB: 1985-11-10, 35 y.o.   MRN: [id number]  HPI: Monique [name] is a 35 y.o. female who returns for follow up appointment for chronic pain and medication refill. She states her pain is located in her neck,  bilateral shoulders and lower back pain. She rates her pain 4. Her current exercise regime is walking and performing stretching exercises.  Monique [name] Morphine equivalent is 12.72  MME. Last UDS was Performed on 12/08/2020, it was consistent.       Pain Inventory Average Pain 7 Pain Right Now 4 My pain is intermittent, constant, sharp, stabbing, tingling, and aching  In the last 24 hours, has pain interfered with the following? General activity 7 Relation with others 7 Enjoyment of life 7 What TIME of day is your pain at its worst? evening and night Sleep (in general) Fair  Pain is worse with: walking, bending, standing, and some activites Pain improves with: rest, heat/ice, therapy/exercise, pacing activities, and medication Relief from Meds: 8  Family History  Problem Relation Age of Onset   Diabetes Father    Cancer Paternal Grandmother        Ovarian   Social History   Socioeconomic History   Marital status: Single    Spouse name: Not on file   Number of children: 3   Years of education: Not on file   Highest education level: Some college, no degree  Occupational History   Occupation: Disability  Tobacco Use   Smoking status: Never    Passive exposure: Yes   Smokeless tobacco: Never  Vaping Use   Vaping Use: Never used  Substance and Sexual Activity   Alcohol use: Not Currently    Comment: occasional   Drug use: No   Sexual activity: Yes    Birth control/protection: None  Other Topics Concern   Not on file  Social History Narrative   Not on file   Social Determinants of Health   Financial Resource Strain: Not on file  Food Insecurity: Not on file  Transportation Needs: Not on file   Physical Activity: Not on file  Stress: Not on file  Social Connections: Not on file   Past Surgical History:  Procedure Laterality Date   APPENDECTOMY     BONE EXCISION Right 02/02/2021   Procedure: OSTEOPHYTE EXCISION;  Surgeon: Altamese [place], MD;  Location: [place];  Service: Orthopedics;  Laterality: Right;   Ceasarean section  2007, 2010,2014   EXTERNAL FIXATION LEG Bilateral 12/14/2012   Procedure: EXTERNAL FIXATION LEG;  Surgeon: [name], MD;  Location: [facility];  Service: Orthopedics;  Laterality: Bilateral;   EXTERNAL FIXATION REMOVAL Bilateral 12/23/2012   Procedure: REMOVAL EXTERNAL FIXATION LEG;  Surgeon: [name], MD;  Location: [facility];  Service: Orthopedics;  Laterality: Bilateral;   HARDWARE REMOVAL Right 02/02/2021   Procedure: HARDWARE REMOVAL;  Surgeon: Altamese [place], MD;  Location: [facility];  Service: Orthopedics;  Laterality: Right;   I & D EXTREMITY Left 12/14/2012   Procedure: IRRIGATION AND DEBRIDEMENT EXTREMITY;  Surgeon: [name], MD;  Location: [place];  Service: Orthopedics;  Laterality: Left;   ORIF ANKLE FRACTURE Left 12/14/2012   Procedure: OPEN REDUCTION INTERNAL FIXATION (ORIF) ANKLE FRACTURE;  Surgeon: [name], MD;  Location: [facility];  Service: Orthopedics;  Laterality: Left;   ORIF TIBIA FRACTURE Bilateral 12/23/2012   Procedure: OPEN REDUCTION INTERNAL FIXATION (ORIF) BILATERAL DISTAL TIBIA FRACTURE;  Surgeon: Astrid Divine  Marcelino Scot, MD;  Location: [place];  Service: Orthopedics;  Laterality: Bilateral;   Past Surgical History:  Procedure Laterality Date   APPENDECTOMY     BONE EXCISION Right 02/02/2021   Procedure: OSTEOPHYTE EXCISION;  Surgeon: Altamese Lares, MD;  Location: [facility];  Service: Orthopedics;  Laterality: Right;   Ceasarean section  2007, 2010,2014   EXTERNAL FIXATION LEG Bilateral 12/14/2012   Procedure: EXTERNAL FIXATION LEG;  Surgeon: [name], MD;  Location: [place];  Service: Orthopedics;  Laterality: Bilateral;   EXTERNAL FIXATION  REMOVAL Bilateral 12/23/2012   Procedure: REMOVAL EXTERNAL FIXATION LEG;  Surgeon: [name], MD;  Location: [facility];  Service: Orthopedics;  Laterality: Bilateral;   HARDWARE REMOVAL Right 02/02/2021   Procedure: HARDWARE REMOVAL;  Surgeon: Altamese [place], MD;  Location: [place];  Service: Orthopedics;  Laterality: Right;   I & D EXTREMITY Left 12/14/2012   Procedure: IRRIGATION AND DEBRIDEMENT EXTREMITY;  Surgeon: [name], MD;  Location: [place];  Service: Orthopedics;  Laterality: Left;   ORIF ANKLE FRACTURE Left 12/14/2012   Procedure: OPEN REDUCTION INTERNAL FIXATION (ORIF) ANKLE FRACTURE;  Surgeon: [name], MD;  Location: [place];  Service: Orthopedics;  Laterality: Left;   ORIF TIBIA FRACTURE Bilateral 12/23/2012   Procedure: OPEN REDUCTION INTERNAL FIXATION (ORIF) BILATERAL DISTAL TIBIA FRACTURE;  Surgeon: [name], MD;  Location: [place];  Service: Orthopedics;  Laterality: Bilateral;   Past Medical History:  Diagnosis Date   Anemia    Anxiety    Cervicalgia    Chronic migraine with aura    Chronic pain syndrome    Cognitive deficit as late effect of traumatic brain injury ([place])    Depression    H/O cervical fracture    Heart murmur    Menorrhagia    TBI (traumatic brain injury)    Ht 5\' 5"  (1.651 m)   Wt 200 lb (90.7 kg)   BMI 33.28 kg/m   Opioid Risk Score:   Fall Risk Score:  `1  Depression screen PHQ 2/9  Depression screen [facility] 2/9 08/17/2021 07/25/2021 06/20/2021 04/12/2021 12/08/2020 10/20/2020 10/13/2020  Decreased Interest 0 1 0 0 0 0 0  Down, Depressed, Hopeless 0 1 0 0 0 0 0  PHQ - 2 Score 0 2 0 0 0 0 0  Altered sleeping - 2 - - - 1 -  Tired, decreased energy - 1 - - - 3 -  Change in appetite - 0 - - - 1 -  Feeling bad or failure about yourself  - 1 - - - 0 -  Trouble concentrating - 1 - - - 2 -  Moving slowly or fidgety/restless - 0 - - - 0 -  Suicidal thoughts - 0 - - - 0 -  PHQ-9 Score - 7 - - - 7 -     Review of Systems  Constitutional:  Negative.   HENT: Negative.    Eyes: Negative.   Respiratory: Negative.    Cardiovascular: Negative.   Gastrointestinal: Negative.   Endocrine: Negative.   Genitourinary: Negative.   Musculoskeletal:  Positive for neck pain.  Skin: Negative.   Allergic/Immunologic: Negative.   Neurological:  Positive for dizziness, weakness and light-headedness.  Hematological: Negative.   Psychiatric/Behavioral: Negative.        Objective:   Physical Exam Vitals and nursing note reviewed.  Constitutional:      Appearance: Normal appearance.  Neck:     Comments: Cervical Paraspinal Tenderness: C-5-C-6 Cardiovascular:  Rate and Rhythm: Normal rate and regular rhythm.     Pulses: Normal pulses.     Heart sounds: Normal heart sounds.  Pulmonary:     Effort: Pulmonary effort is normal.     Breath sounds: Normal breath sounds.  Musculoskeletal:     Cervical back: Normal range of motion and neck supple.     Comments: Normal Muscle Bulk and Muscle Testing Reveals:  Upper Extremities: Full ROM and Muscle Strength 5/5  Thoracic Paraspinal Tenderness: T-7-T-9 Lower Extremities: Full ROM and Muscle Strength 5/5 Arises from chair with ease Narrow Based  Gait     Skin:    General: Skin is warm and dry.  Neurological:     Mental Status: She is alert and oriented to person, place, and time.  Psychiatric:        Mood and Affect: Mood normal.        Behavior: Behavior normal.         Assessment & Plan:  1.TBI:Cognitive deficit: Continue to Monitor. 08/17/2021.  Continue with current treatment: using the various devices to help with your Organization such as notes, calendars and using her cell phone to set reminders. 2. Chronic Pain Syndrome: Continue with  slow weaning of Oxycodone. Refilled : Oxycodone 10 mg 1 tablet BID  as needed as needed # 45.  We will continue the opioid monitoring program, this consists of regular clinic visits, examinations, urine drug screen, pill counts as well as  use of [place] Controlled Substance Reporting system. A 12 month History has been reviewed on the [facility] on 08/17/2021. 3. Bilateral Pilon Fractures:  Continue to Monitor. 08/17/2021 4. Tibial Nerve Injuries: Continue to monitor. 08/17/2021 5. Migraine: No complaints today. Continue to monitor. 08/17/2021 6. Right Foot Pain: S/P  On 02/02/2021 by Dr Marcelino Scot: Ortho Following. OSTEOPHYTE EXCISION Right General  HARDWARE REMOVAL          7. Cervicalgia: Continue HEP as Tolerated. Continue to Monitor.  08/17/2021   F/U in 2 Months

## 2021-09-07 ENCOUNTER — Other Ambulatory Visit: Payer: Self-pay

## 2021-09-07 ENCOUNTER — Encounter: Payer: Self-pay | Admitting: Physical Therapy

## 2021-09-07 ENCOUNTER — Ambulatory Visit: Payer: Medicaid Other | Attending: Psychiatry | Admitting: Physical Therapy

## 2021-09-07 DIAGNOSIS — M25511 Pain in right shoulder: Secondary | ICD-10-CM | POA: Insufficient documentation

## 2021-09-07 DIAGNOSIS — M542 Cervicalgia: Secondary | ICD-10-CM | POA: Insufficient documentation

## 2021-09-07 DIAGNOSIS — G8929 Other chronic pain: Secondary | ICD-10-CM | POA: Diagnosis present

## 2021-09-07 DIAGNOSIS — R2681 Unsteadiness on feet: Secondary | ICD-10-CM | POA: Diagnosis present

## 2021-09-07 DIAGNOSIS — M6281 Muscle weakness (generalized): Secondary | ICD-10-CM | POA: Insufficient documentation
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
## 2021-09-12 ENCOUNTER — Telehealth: Payer: Self-pay | Admitting: Psychiatry

## 2021-09-12 NOTE — Telephone Encounter (Signed)
Pt called asking if a PA can be sent over to [facility] for additional hours of PT.

## 2021-09-12 NOTE — Telephone Encounter (Signed)
Per referral department additional hour PA through medicaid will need to be completed through the office seeing the pt for physical therapy.  I contacted the pt and advised of this information and she will call physcial therapy office at [facility] for them to complete.

## 2021-09-14 ENCOUNTER — Other Ambulatory Visit: Payer: Self-pay

## 2021-09-14 ENCOUNTER — Ambulatory Visit: Payer: Medicaid Other | Admitting: Physical Therapy

## 2021-09-14 ENCOUNTER — Encounter: Payer: Self-pay | Admitting: Physical Therapy

## 2021-09-14 DIAGNOSIS — R2681 Unsteadiness on feet: Secondary | ICD-10-CM

## 2021-09-14 DIAGNOSIS — M542 Cervicalgia: Secondary | ICD-10-CM

## 2021-09-14 DIAGNOSIS — G8929 Other chronic pain: Secondary | ICD-10-CM

## 2021-09-14 DIAGNOSIS — M6281 Muscle weakness (generalized): Secondary | ICD-10-CM
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
## 2021-09-18 ENCOUNTER — Telehealth: Payer: Self-pay

## 2021-09-18 NOTE — Telephone Encounter (Addendum)
Patient is calling to request refill on Oxycodone. Per PMP, last fill was 07/08/21

## 2021-09-19 ENCOUNTER — Encounter: Payer: Self-pay | Admitting: Physical Therapy

## 2021-09-19 ENCOUNTER — Other Ambulatory Visit: Payer: Self-pay

## 2021-09-19 ENCOUNTER — Ambulatory Visit: Payer: Medicaid Other | Admitting: Physical Therapy

## 2021-09-19 DIAGNOSIS — G8929 Other chronic pain: Secondary | ICD-10-CM

## 2021-09-19 DIAGNOSIS — M25511 Pain in right shoulder: Secondary | ICD-10-CM

## 2021-09-19 DIAGNOSIS — M6281 Muscle weakness (generalized): Secondary | ICD-10-CM

## 2021-09-19 DIAGNOSIS — M542 Cervicalgia: Secondary | ICD-10-CM

## 2021-09-19 DIAGNOSIS — R2681 Unsteadiness on feet: Secondary | ICD-10-CM
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
## 2021-09-19 NOTE — Telephone Encounter (Addendum)
PMP was reviewed.  This provider called [facility], spoke with the pharmacist , they have her Oxycodone prescription.  Placed a call to Monique [name] regarding the above, she verbalizes understanding.

## 2021-09-21 ENCOUNTER — Ambulatory Visit: Payer: Medicaid Other | Admitting: Physical Therapy

## 2021-09-25 ENCOUNTER — Ambulatory Visit: Payer: Medicaid Other | Admitting: Physical Therapy

## 2021-09-25 ENCOUNTER — Other Ambulatory Visit: Payer: Self-pay

## 2021-09-25 ENCOUNTER — Encounter: Payer: Self-pay | Admitting: Physical Therapy

## 2021-09-25 DIAGNOSIS — M542 Cervicalgia: Secondary | ICD-10-CM

## 2021-09-25 DIAGNOSIS — M6281 Muscle weakness (generalized): Secondary | ICD-10-CM

## 2021-09-25 DIAGNOSIS — G8929 Other chronic pain: Secondary | ICD-10-CM

## 2021-09-25 DIAGNOSIS — R2681 Unsteadiness on feet: Secondary | ICD-10-CM

## 2021-09-25 NOTE — Therapy (Signed)
[facility] [place], [place], [address] Phone: [phone]   Fax:  [phone]  Physical Therapy Treatment  Patient Details  Name: Monique [name] MRN: [id number] Date of Birth: August 11, 1986 Referring Provider (PT): Genia Harold   Encounter Date: 09/25/2021   PT End of Session - 09/25/21 1841     Visit Number 4    Date for PT Re-Evaluation 09/28/21    Authorization Type [employer]    PT Start Time 1751    PT Stop Time 1800    PT Time Calculation (min) 48 min    Activity Tolerance Patient tolerated treatment well    Behavior During Therapy WFL for tasks assessed/performed             Past Medical History:  Diagnosis Date   Anemia    Anxiety    Cervicalgia    Chronic migraine with aura    Chronic pain syndrome    Cognitive deficit as late effect of traumatic brain injury ([place])    Depression    H/O cervical fracture    Heart murmur    Menorrhagia    TBI (traumatic brain injury)     Past Surgical History:  Procedure Laterality Date   APPENDECTOMY     BONE EXCISION Right 02/02/2021   Procedure: OSTEOPHYTE EXCISION;  Surgeon: Altamese [place], MD;  Location: [facility];  Service: Orthopedics;  Laterality: Right;   Ceasarean section  2007, 2010,2014   EXTERNAL FIXATION LEG Bilateral 12/14/2012   Procedure: EXTERNAL FIXATION LEG;  Surgeon: [name], MD;  Location: [facility];  Service: Orthopedics;  Laterality: Bilateral;   EXTERNAL FIXATION REMOVAL Bilateral 12/23/2012   Procedure: REMOVAL EXTERNAL FIXATION LEG;  Surgeon: [name], MD;  Location: [facility];  Service: Orthopedics;  Laterality: Bilateral;   HARDWARE REMOVAL Right 02/02/2021   Procedure: HARDWARE REMOVAL;  Surgeon: Altamese [place], MD;  Location: [facility];  Service: Orthopedics;  Laterality: Right;   I & D EXTREMITY Left 12/14/2012   Procedure: IRRIGATION AND DEBRIDEMENT EXTREMITY;  Surgeon: [name], MD;  Location: [facility];  Service:  Orthopedics;  Laterality: Left;   ORIF ANKLE FRACTURE Left 12/14/2012   Procedure: OPEN REDUCTION INTERNAL FIXATION (ORIF) ANKLE FRACTURE;  Surgeon: [name], MD;  Location: [facility];  Service: Orthopedics;  Laterality: Left;   ORIF TIBIA FRACTURE Bilateral 12/23/2012   Procedure: OPEN REDUCTION INTERNAL FIXATION (ORIF) BILATERAL DISTAL TIBIA FRACTURE;  Surgeon: [name], MD;  Location: [facility];  Service: Orthopedics;  Laterality: Bilateral;    There were no vitals filed for this visit.   Subjective Assessment - 09/25/21 1747     Subjective REally hurting, tight and sore    Currently in Pain? Yes    Pain Score 3     Pain Location Neck    Pain Orientation Right    Pain Descriptors / Indicators Sore;Spasm;Tightness    Aggravating Factors  stress, sitting at work                [facility] PT Assessment - 09/25/21 0001       Posture/Postural Control   Posture Comments continues to elevate the shoulders and has slouched sitting posture                           [facility] Adult PT Treatment/Exercise - 09/25/21 0001       Therapeutic Activites    Therapeutic Activities Other Therapeutic  Activities    Other Therapeutic Activities posture at desk, [place] set up      Shoulder Exercises: Stretch   Corner Stretch 3 reps;10 seconds    [place] Stretch 3 reps;10 seconds      Manual Therapy   Manual Therapy Soft tissue mobilization;Passive ROM;Manual Traction    Soft tissue mobilization Cervical para spinales UT, rhomdoid area    Passive ROM cervical side bends to L for stretch    Manual Traction occitpital release                       PT Short Term Goals - 09/19/21 1011       PT SHORT TERM GOAL #1   Title I with basic HEP    Status Partially Met               PT Long Term Goals - 09/25/21 1843       PT LONG TERM GOAL #1   Title I with advanced HEP    Status On-going      PT LONG TERM GOAL #2   Title Patient will report  cervical/shoulder pain < 4/10 after at least 1 hour of standing/walking activitiy.    Status Partially Met      PT LONG TERM GOAL #3   Title Increase B UE strength to at least 4-/5, not limited by painful neck.    Status Partially Met      PT LONG TERM GOAL #4   Title Patient will verbalize and demosntrate improved posture with more neutral pelvis, natural thoracic kyphosis, and scapulae flat on thorax.    Status On-going                   Plan - 09/25/21 1842     Clinical Impression Statement Patient is progressing as she is reporting some less pain overall and reports feeling stronger, she continues to have issues with posture and tends to slouch she does report past hx of sternal fracture that may contribute to this, she also elevates her shoulder and needs cues to relax, and then has difficulty relaxing saying "I thought I was", this is leading to some increase in spasms, she has significant knots in the upper traps and tenderness in the neck as well    PT Next Visit Plan cervical ROM, strength, posture    Consulted and Agree with Plan of Care Patient             Patient will benefit from skilled therapeutic intervention in order to improve the following deficits and impairments:  Pain, Decreased range of motion, Difficulty walking, Abnormal gait, Increased fascial restricitons, Decreased endurance, Decreased activity tolerance, Decreased balance, Decreased mobility, Decreased strength, Impaired sensation, Postural dysfunction, Impaired flexibility, Improper body mechanics  Visit Diagnosis: Cervicalgia  Chronic right shoulder pain  Unsteadiness on feet  Muscle weakness (generalized)     Problem List Patient Active Problem List   Diagnosis Date Noted   Fibroids 10/19/2020   History of uterine fibroid 08/12/2020   Menorrhagia with irregular cycle 08/12/2020   Gastroesophageal reflux disease without esophagitis 06/02/2020   Keloid of skin 06/02/2020   Vitamin  D deficiency 04/29/2019   Iron deficiency anemia    Iliotibial band syndrome affecting right lower leg 11/05/2018   Therapeutic opioid induced constipation 11/05/2018   Chronic pain syndrome 05/12/2018   Cognitive deficit as late effect of traumatic brain injury ([place]) 08/31/2014   TBI (traumatic brain injury) 09/16/2013  Tibial nerve lesion 03/31/2013   Neck mass 02/06/2013   New onset of headaches due to trauma 01/02/2013   Migraine 12/21/2012   Concussion 12/18/2012   Closed C2 fracture ([place]) 12/15/2012   Fracture, sternum closed 12/15/2012   Bilateral ankle fractures 12/15/2012   Pulmonary contusion 12/15/2012    Sumner Boast, PT 09/25/2021, 6:44 PM  [facility] [place], [place], [address] Phone: [phone]   Fax:  [phone]  Name: Daila Elbert MRN: [id number] Date of Birth: Mar 09, 1986

## 2021-09-25 NOTE — Patient Instructions (Addendum)
Access Code: [email or web address] URL: [email or web address] Date: 09/25/2021 Prepared by: Lum Babe  Exercises Standing Row with Anchored Resistance - 1 x daily - 7 x weekly - 2 sets - 10 reps - 3 hold Shoulder extension with resistance - Neutral - 1 x daily - 7 x weekly - 2 sets - 10 reps - 3 hold Standing Shoulder External Rotation with Resistance - 1 x daily - 7 x weekly - 2 sets - 10 reps - 3 hold Standing Shoulder Horizontal Abduction with Resistance - 1 x daily - 7 x weekly - 2 sets - 10 reps - 3 hold   Trigger Point Dry Needling  What is Trigger Point Dry Needling (DN)? DN is a physical therapy technique used to treat muscle pain and dysfunction. Specifically, DN helps deactivate muscle trigger points (muscle knots).  A thin filiform needle is used to penetrate the skin and stimulate the underlying trigger point. The goal is for a local twitch response (LTR) to occur and for the trigger point to relax. No medication of any kind is injected during the procedure.   What Does Trigger Point Dry Needling Feel Like?  The procedure feels different for each individual patient. Some patients report that they do not actually feel the needle enter the skin and overall the process is not painful. Very mild bleeding may occur. However, many patients feel a deep cramping in the muscle in which the needle was inserted. This is the local twitch response.   How Will I feel after the treatment? Soreness is normal, and the onset of soreness may not occur for a few hours. Typically this soreness does not last longer than two days.  Bruising is uncommon, however; ice can be used to decrease any possible bruising.  In rare cases feeling tired or nauseous after the treatment is normal. In addition, your symptoms may get worse before they get better, this period will typically not last longer than 24 hours.   What Can I do After My Treatment? Increase your hydration by drinking more water for the  next 24 hours. You may place ice or heat on the areas treated that have become sore, however, do not use heat on inflamed or bruised areas. Heat often brings more relief post needling. You can continue your regular activities, but vigorous activity is not recommended initially after the treatment for 24 hours. DN is best combined with other physical therapy such as strengthening, stretching, and other therapies.

## 2021-09-26 ENCOUNTER — Ambulatory Visit: Payer: Medicaid Other | Admitting: Physical Therapy

## 2021-09-28 ENCOUNTER — Ambulatory Visit: Payer: Medicaid Other | Admitting: Physical Therapy

## 2021-10-03 ENCOUNTER — Ambulatory Visit: Payer: Medicaid Other | Admitting: Physical Therapy

## 2021-10-05 ENCOUNTER — Ambulatory Visit: Payer: Medicaid Other | Admitting: Physical Therapy

## 2021-10-12 ENCOUNTER — Other Ambulatory Visit: Payer: Self-pay

## 2021-10-12 ENCOUNTER — Encounter: Payer: Self-pay | Admitting: Registered Nurse

## 2021-10-12 ENCOUNTER — Encounter: Payer: Medicaid Other | Attending: Physical Medicine & Rehabilitation | Admitting: Registered Nurse

## 2021-10-12 VITALS — BP 103/72 | HR 81 | Ht 65.0 in | Wt 191.0 lb

## 2021-10-12 DIAGNOSIS — G894 Chronic pain syndrome: Secondary | ICD-10-CM | POA: Insufficient documentation

## 2021-10-12 DIAGNOSIS — Z5181 Encounter for therapeutic drug level monitoring: Secondary | ICD-10-CM | POA: Diagnosis present

## 2021-10-12 DIAGNOSIS — F068 Other specified mental disorders due to known physiological condition: Secondary | ICD-10-CM | POA: Insufficient documentation

## 2021-10-12 DIAGNOSIS — S069X0S Unspecified intracranial injury without loss of consciousness, sequela: Secondary | ICD-10-CM | POA: Diagnosis present

## 2021-10-12 DIAGNOSIS — S82891S Other fracture of right lower leg, sequela: Secondary | ICD-10-CM | POA: Diagnosis present

## 2021-10-12 DIAGNOSIS — S82892S Other fracture of left lower leg, sequela: Secondary | ICD-10-CM | POA: Insufficient documentation

## 2021-10-12 DIAGNOSIS — Z79891 Long term (current) use of opiate analgesic: Secondary | ICD-10-CM | POA: Insufficient documentation

## 2021-10-12 MED ORDER — OXYCODONE HCL 10 MG PO TABS: 10.0000 mg | ORAL_TABLET | Freq: Two times a day (BID) | ORAL | 0 refills | Status: DC | PRN

## 2021-10-12 NOTE — Progress Notes (Signed)
Subjective:    Patient ID: Monique [name], female    DOB: 07-20-1986, 36 y.o.   MRN: [id number]  HPI: [name] is a 36 y.o. female who returns for follow up appointment for chronic pain and medication refill. She states her pain is located in  her right shin and right foot pain. She  rates her pain 5. Her current exercise regime is walking and performing stretching exercises.  [name] Morphine equivalent is 29.35 MME.   UDS ordered today.     Pain Inventory Average Pain 10 Pain Right Now 5 My pain is sharp, stabbing, tingling, and aching  In the last 24 hours, has pain interfered with the following? General activity 7 Relation with others 7 Enjoyment of life 7 What TIME of day is your pain at its worst? evening and night Sleep (in general) Fair  Pain is worse with: walking, bending, standing, and some activites Pain improves with: rest, heat/ice, and medication Relief from Meds: 8  Family History  Problem Relation Age of Onset   Diabetes Father    Cancer Paternal Grandmother        Ovarian   Social History   Socioeconomic History   Marital status: Single    Spouse name: Not on file   Number of children: 3   Years of education: Not on file   Highest education level: Some college, no degree  Occupational History   Occupation: Disability  Tobacco Use   Smoking status: Never    Passive exposure: Yes   Smokeless tobacco: Never  Vaping Use   Vaping Use: Never used  Substance and Sexual Activity   Alcohol use: Not Currently    Comment: occasional   Drug use: No   Sexual activity: Yes    Birth control/protection: None  Other Topics Concern   Not on file  Social History Narrative   Not on file   Social Determinants of Health   Financial Resource Strain: Not on file  Food Insecurity: Not on file  Transportation Needs: Not on file  Physical Activity: Not on file  Stress: Not on file  Social Connections: Not on file   Past Surgical  History:  Procedure Laterality Date   APPENDECTOMY     BONE EXCISION Right 02/02/2021   Procedure: OSTEOPHYTE EXCISION;  Surgeon: Altamese [place], MD;  Location: [facility];  Service: Orthopedics;  Laterality: Right;   Ceasarean section  2007, 2010,2014   EXTERNAL FIXATION LEG Bilateral 12/14/2012   Procedure: EXTERNAL FIXATION LEG;  Surgeon: [name], MD;  Location: [facility];  Service: Orthopedics;  Laterality: Bilateral;   EXTERNAL FIXATION REMOVAL Bilateral 12/23/2012   Procedure: REMOVAL EXTERNAL FIXATION LEG;  Surgeon: [name], MD;  Location: [facility];  Service: Orthopedics;  Laterality: Bilateral;   HARDWARE REMOVAL Right 02/02/2021   Procedure: HARDWARE REMOVAL;  Surgeon: Altamese [place], MD;  Location: [facility];  Service: Orthopedics;  Laterality: Right;   I & D EXTREMITY Left 12/14/2012   Procedure: IRRIGATION AND DEBRIDEMENT EXTREMITY;  Surgeon: [name], MD;  Location: [facility];  Service: Orthopedics;  Laterality: Left;   ORIF ANKLE FRACTURE Left 12/14/2012   Procedure: OPEN REDUCTION INTERNAL FIXATION (ORIF) ANKLE FRACTURE;  Surgeon: [name], MD;  Location: [facility];  Service: Orthopedics;  Laterality: Left;   ORIF TIBIA FRACTURE Bilateral 12/23/2012   Procedure: OPEN REDUCTION INTERNAL FIXATION (ORIF) BILATERAL DISTAL TIBIA FRACTURE;  Surgeon: [name], MD;  Location: [facility];  Service: Orthopedics;  Laterality: Bilateral;  Past Surgical History:  Procedure Laterality Date   APPENDECTOMY     BONE EXCISION Right 02/02/2021   Procedure: OSTEOPHYTE EXCISION;  Surgeon: Altamese [place], MD;  Location: [facility];  Service: Orthopedics;  Laterality: Right;   Ceasarean section  2007, 2010,2014   EXTERNAL FIXATION LEG Bilateral 12/14/2012   Procedure: EXTERNAL FIXATION LEG;  Surgeon: [name], MD;  Location: [facility];  Service: Orthopedics;  Laterality: Bilateral;   EXTERNAL FIXATION REMOVAL Bilateral 12/23/2012   Procedure: REMOVAL EXTERNAL FIXATION LEG;  Surgeon: [name], MD;   Location: [facility];  Service: Orthopedics;  Laterality: Bilateral;   HARDWARE REMOVAL Right 02/02/2021   Procedure: HARDWARE REMOVAL;  Surgeon: Altamese [place], MD;  Location: [facility];  Service: Orthopedics;  Laterality: Right;   I & D EXTREMITY Left 12/14/2012   Procedure: IRRIGATION AND DEBRIDEMENT EXTREMITY;  Surgeon: [name], MD;  Location: [facility];  Service: Orthopedics;  Laterality: Left;   ORIF ANKLE FRACTURE Left 12/14/2012   Procedure: OPEN REDUCTION INTERNAL FIXATION (ORIF) ANKLE FRACTURE;  Surgeon: [name], MD;  Location: [facility];  Service: Orthopedics;  Laterality: Left;   ORIF TIBIA FRACTURE Bilateral 12/23/2012   Procedure: OPEN REDUCTION INTERNAL FIXATION (ORIF) BILATERAL DISTAL TIBIA FRACTURE;  Surgeon: [name], MD;  Location: [facility];  Service: Orthopedics;  Laterality: Bilateral;   Past Medical History:  Diagnosis Date   Anemia    Anxiety    Cervicalgia    Chronic migraine with aura    Chronic pain syndrome    Cognitive deficit as late effect of traumatic brain injury ([place])    Depression    H/O cervical fracture    Heart murmur    Menorrhagia    TBI (traumatic brain injury)    BP 103/72    Pulse 81    Ht 5\' 5"  (1.651 m)    Wt 191 lb (86.6 kg)    SpO2 97%    BMI 31.78 kg/m   Opioid Risk Score:   Fall Risk Score:  `1  Depression screen PHQ 2/9  Depression screen [facility] 2/9 10/12/2021 08/17/2021 07/25/2021 06/20/2021 04/12/2021 12/08/2020 10/20/2020  Decreased Interest 0 0 1 0 0 0 0  Down, Depressed, Hopeless 0 0 1 0 0 0 0  PHQ - 2 Score 0 0 2 0 0 0 0  Altered sleeping - - 2 - - - 1  Tired, decreased energy - - 1 - - - 3  Change in appetite - - 0 - - - 1  Feeling bad or failure about yourself  - - 1 - - - 0  Trouble concentrating - - 1 - - - 2  Moving slowly or fidgety/restless - - 0 - - - 0  Suicidal thoughts - - 0 - - - 0  PHQ-9 Score - - 7 - - - 7     Review of Systems  Musculoskeletal:        Right lower leg pain Bilateral foot pain  All other systems  reviewed and are negative.     Objective:   Physical Exam Vitals and nursing note reviewed.  Constitutional:      Appearance: Normal appearance.  Cardiovascular:     Rate and Rhythm: Normal rate and regular rhythm.     Pulses: Normal pulses.     Heart sounds: Normal heart sounds.  Pulmonary:     Effort: Pulmonary effort is normal.     Breath sounds: Normal breath sounds.  Musculoskeletal:  Cervical back: Normal range of motion and neck supple.     Comments: Normal Muscle Bulk and Muscle Testing Reveals:  Upper Extremities: Full ROM and Muscle Strength 5/5 Lower Extremities: Full ROM and Muscle Strength 5/5 Arises from Chair with ease Narrow based  Gait     Skin:    General: Skin is warm and dry.  Neurological:     Mental Status: She is alert and oriented to person, place, and time.  Psychiatric:        Mood and Affect: Mood normal.        Behavior: Behavior normal.         Assessment & Plan:  1.TBI:Cognitive deficit: Continue to Monitor. 10/12/2021  Continue with current treatment: using the various devices to help with your Organization such as notes, calendars and using her cell phone to set reminders. 2. Chronic Pain Syndrome: Continue with  slow weaning of Oxycodone. Refilled : Oxycodone 10 mg 1 tablet BID  as needed as needed # 45.  We will continue the opioid monitoring program, this consists of regular clinic visits, examinations, urine drug screen, pill counts as well as use of [place] Controlled Substance Reporting system. A 12 month History has been reviewed on the [place] on 10/12/2021. 3. Bilateral Pilon Fractures:  Continue to Monitor. 10/12/2021 4. Tibial Nerve Injuries: Continue to monitor. 10/12/2021 5. Migraine: No complaints today. Continue to monitor. 10/12/2021 6. Right Foot Pain: S/P  On 02/02/2021 by Dr Marcelino Scot: Ortho Following. OSTEOPHYTE EXCISION Right General  HARDWARE REMOVAL          7.  Cervicalgia: Continue HEP as Tolerated. Continue to Monitor.  10/12/2021   F/U in 2 Months

## 2021-10-18 LAB — TOXASSURE SELECT,+ANTIDEPR,UR

## 2021-10-20 ENCOUNTER — Telehealth: Payer: Self-pay | Admitting: *Deleted

## 2021-10-20 NOTE — Telephone Encounter (Signed)
Urine drug screen for this encounter is consistent for prescribed medication 

## 2021-10-31 ENCOUNTER — Ambulatory Visit: Payer: Medicaid Other | Admitting: Psychiatry

## 2021-11-21 ENCOUNTER — Ambulatory Visit: Payer: Medicaid Other | Admitting: Physical Therapy

## 2021-11-29 ENCOUNTER — Ambulatory Visit: Payer: Medicaid Other | Attending: Psychiatry | Admitting: Physical Therapy

## 2021-12-04 ENCOUNTER — Ambulatory Visit: Payer: Medicaid Other | Admitting: Physical Therapy

## 2021-12-05 ENCOUNTER — Emergency Department (HOSPITAL_COMMUNITY)
Admission: EM | Admit: 2021-12-05 | Discharge: 2021-12-06 | Disposition: A | Discharge status: Home / Self Care | Payer: Medicaid Other | Attending: Emergency Medicine | Admitting: Emergency Medicine

## 2021-12-05 ENCOUNTER — Encounter (HOSPITAL_COMMUNITY): Payer: Self-pay

## 2021-12-05 ENCOUNTER — Other Ambulatory Visit: Payer: Self-pay

## 2021-12-05 ENCOUNTER — Emergency Department (HOSPITAL_COMMUNITY): Payer: Medicaid Other

## 2021-12-05 DIAGNOSIS — R42 Dizziness and giddiness: Secondary | ICD-10-CM | POA: Diagnosis present

## 2021-12-05 DIAGNOSIS — Z5321 Procedure and treatment not carried out due to patient leaving prior to being seen by health care provider: Secondary | ICD-10-CM | POA: Diagnosis not present

## 2021-12-05 DIAGNOSIS — R079 Chest pain, unspecified: Secondary | ICD-10-CM | POA: Insufficient documentation

## 2021-12-05 DIAGNOSIS — R55 Syncope and collapse: Secondary | ICD-10-CM | POA: Insufficient documentation

## 2021-12-05 LAB — CBC
HCT: 31.1 % — ABNORMAL LOW (ref 36.0–46.0)
Hemoglobin: 9.1 g/dL — ABNORMAL LOW (ref 12.0–15.0)
MCH: 20.9 pg — ABNORMAL LOW (ref 26.0–34.0)
MCHC: 29.3 g/dL — ABNORMAL LOW (ref 30.0–36.0)
MCV: 71.3 fL — ABNORMAL LOW (ref 80.0–100.0)
Platelets: 348 10*3/uL (ref 150–400)
RBC: 4.36 MIL/uL (ref 3.87–5.11)
RDW: 18.6 % — ABNORMAL HIGH (ref 11.5–15.5)
WBC: 6.5 10*3/uL (ref 4.0–10.5)
nRBC: 0.3 % — ABNORMAL HIGH (ref 0.0–0.2)

## 2021-12-05 LAB — BASIC METABOLIC PANEL
Anion gap: 10 (ref 5–15)
BUN: 10 mg/dL (ref 6–20)
CO2: 25 mmol/L (ref 22–32)
Calcium: 9 mg/dL (ref 8.9–10.3)
Chloride: 102 mmol/L (ref 98–111)
Creatinine, Ser: 0.86 mg/dL (ref 0.44–1.00)
GFR, Estimated: >60 mL/min (ref >60)
Glucose, Bld: 114 mg/dL — ABNORMAL HIGH (ref 70–99)
Potassium: 3.7 mmol/L (ref 3.5–5.1)
Sodium: 137 mmol/L (ref 135–145)

## 2021-12-05 LAB — I-STAT BETA HCG BLOOD, ED (MC, WL, AP ONLY): I-stat hCG, quantitative: <5 m[IU]/mL (ref <5)

## 2021-12-05 LAB — TROPONIN I (HIGH SENSITIVITY)
Troponin I (High Sensitivity): 3 ng/L (ref <18)
Troponin I (High Sensitivity): 3 ng/L (ref <18)

## 2021-12-05 NOTE — ED Provider Triage Note (Signed)
Emergency Medicine Provider Triage Evaluation Note  Monique [name] , a 36 y.o. female  was evaluated in triage.  Pt complains of near syncope episode while she was at at home.  Patient got up to get a bottle of water and got very lightheaded, had tunnel vision, has some left lower chest pain, and feeling she is in a pass out.  This ultimately passed and she is feeling okay now.  No OCPs, hormone placement therapy, recent travel.  Review of Systems  Positive:  Negative: See above   Physical Exam  BP 125/74 (BP Location: Left Arm)    Pulse 86    Temp 99.3 F (37.4 C) (Oral)    Resp 16    Ht 5\' 5"  (1.651 m)    Wt 88.5 kg    LMP 12/05/2021    SpO2 100%    BMI 32.45 kg/m  Gen:   Awake, no distress   Resp:  Normal effort  MSK:   Moves extremities without difficulty  Other:    Medical Decision Making  Medically screening exam initiated at 6:23 PM.  Appropriate orders placed.  Monique [name] was informed that the remainder of the evaluation will be completed by another provider, this initial triage assessment does not replace that evaluation, and the importance of remaining in the ED until their evaluation is complete.     Monique [name], [place] 12/05/21 1824

## 2021-12-05 NOTE — ED Triage Notes (Signed)
Pt states she got up from her desk and became dizzy and faint. Pt states she dropped her water due to dizziness. Pt states she was concerned that she was about to pass out at home. Pt states that she is feeling better now.

## 2021-12-07 ENCOUNTER — Telehealth: Payer: Self-pay | Admitting: Registered Nurse

## 2021-12-07 ENCOUNTER — Encounter: Payer: Medicaid Other | Admitting: Registered Nurse

## 2021-12-07 NOTE — Telephone Encounter (Signed)
Ms. Younes called office stating she has a sick child. Office Policy was reviewed. Her appointment was re-scheduled for next week, she verbalizes understanding.  ?

## 2021-12-12 ENCOUNTER — Encounter: Payer: Medicaid Other | Attending: Physical Medicine & Rehabilitation | Admitting: Registered Nurse

## 2021-12-12 ENCOUNTER — Encounter: Payer: Self-pay | Admitting: Registered Nurse

## 2021-12-12 ENCOUNTER — Other Ambulatory Visit: Payer: Self-pay

## 2021-12-12 VITALS — BP 108/74 | HR 84 | Temp 98.4°F | Ht 65.0 in | Wt 199.6 lb

## 2021-12-12 DIAGNOSIS — G8929 Other chronic pain: Secondary | ICD-10-CM | POA: Diagnosis present

## 2021-12-12 DIAGNOSIS — F068 Other specified mental disorders due to known physiological condition: Secondary | ICD-10-CM | POA: Insufficient documentation

## 2021-12-12 DIAGNOSIS — G894 Chronic pain syndrome: Secondary | ICD-10-CM | POA: Insufficient documentation

## 2021-12-12 DIAGNOSIS — M542 Cervicalgia: Secondary | ICD-10-CM | POA: Diagnosis not present

## 2021-12-12 DIAGNOSIS — S82892S Other fracture of left lower leg, sequela: Secondary | ICD-10-CM | POA: Insufficient documentation

## 2021-12-12 DIAGNOSIS — M25511 Pain in right shoulder: Secondary | ICD-10-CM | POA: Diagnosis present

## 2021-12-12 DIAGNOSIS — M25512 Pain in left shoulder: Secondary | ICD-10-CM | POA: Diagnosis present

## 2021-12-12 DIAGNOSIS — S82891S Other fracture of right lower leg, sequela: Secondary | ICD-10-CM | POA: Insufficient documentation

## 2021-12-12 DIAGNOSIS — Z5181 Encounter for therapeutic drug level monitoring: Secondary | ICD-10-CM | POA: Insufficient documentation

## 2021-12-12 DIAGNOSIS — S069X0S Unspecified intracranial injury without loss of consciousness, sequela: Secondary | ICD-10-CM | POA: Diagnosis present

## 2021-12-12 DIAGNOSIS — Z79891 Long term (current) use of opiate analgesic: Secondary | ICD-10-CM | POA: Diagnosis present

## 2021-12-12 MED ORDER — OXYCODONE HCL 10 MG PO TABS: 10.0000 mg | ORAL_TABLET | Freq: Two times a day (BID) | ORAL | 0 refills | Status: DC | PRN

## 2021-12-12 NOTE — Progress Notes (Signed)
Subjective:    Patient ID: Monique [name], female    DOB: 1986-03-17, 36 y.o.   MRN: [id number]  HPI: Monique [name] is a 36 y.o. female who returns for follow up appointment for chronic pain and medication refill. She states her  pain is located in her neck, bilateral shoulders and occasionally right upper and right lower extremities with numbness. She rates her pain 5. Her current exercise regime is walking. She also reports she went to [facility] ED on 12/05/2021. Note from Monique [name] , a 36 y.o. female  was evaluated in triage.  Pt complains of near syncope episode while she was at at home.  Patient got up to get a bottle of water and got very lightheaded, had tunnel vision, has some left lower chest pain, and feeling she is in a pass out.  This ultimately passed and she is feeling okay now.  No OCPs, hormone placement therapy, recent travel.  Monique [name] left the ER, she stated she sat for 9 hours and since her daughter was with her, she wasn't able to stay any longer. She will F/U with her PCP.   Monique [name] would like to speak with Dr Naaman Plummer regarding the above and her right upper and lower extremity numbness. He Last X-ray and MRI results was reviewed. We will await Dr Naaman Plummer input regarding the above, regarding EMG or imaging studies. She will also  follow up with Dr Marcelino Scot regarding her right lower extremity pain. We will continue to monitor.    [name] Morphine equivalent is 30.68 MME.   Last UDS was Performed on 10/12/2021, it was consistent.     Pain Inventory Average Pain 8 Pain Right Now 5 My pain is sharp, stabbing, and aching  In the last 24 hours, has pain interfered with the following? General activity 7 Relation with others 7 Enjoyment of life 7 What TIME of day is your pain at its worst? daytime, evening, and night Sleep (in general) NA  Pain is worse with: walking, bending, sitting, standing, and some activites Pain  improves with: rest, heat/ice, and medication Relief from Meds: 7  Family History  Problem Relation Age of Onset   Diabetes Father    Cancer Paternal Grandmother        Ovarian   Social History   Socioeconomic History   Marital status: Single    Spouse name: Not on file   Number of children: 3   Years of education: Not on file   Highest education level: Some college, no degree  Occupational History   Occupation: Disability  Tobacco Use   Smoking status: Never    Passive exposure: Yes   Smokeless tobacco: Never  Vaping Use   Vaping Use: Never used  Substance and Sexual Activity   Alcohol use: Not Currently    Comment: occasional   Drug use: No   Sexual activity: Yes    Birth control/protection: None  Other Topics Concern   Not on file  Social History Narrative   Not on file   Social Determinants of Health   Financial Resource Strain: Not on file  Food Insecurity: Not on file  Transportation Needs: Not on file  Physical Activity: Not on file  Stress: Not on file  Social Connections: Not on file   Past Surgical History:  Procedure Laterality Date   APPENDECTOMY     BONE EXCISION Right 02/02/2021   Procedure: OSTEOPHYTE EXCISION;  Surgeon: Altamese [place], MD;  Location:  [facility];  Service: Orthopedics;  Laterality: Right;   Ceasarean section  2007, 2010,2014   EXTERNAL FIXATION LEG Bilateral 12/14/2012   Procedure: EXTERNAL FIXATION LEG;  Surgeon: [name], MD;  Location: [place];  Service: Orthopedics;  Laterality: Bilateral;   EXTERNAL FIXATION REMOVAL Bilateral 12/23/2012   Procedure: REMOVAL EXTERNAL FIXATION LEG;  Surgeon: [name], MD;  Location: [place];  Service: Orthopedics;  Laterality: Bilateral;   HARDWARE REMOVAL Right 02/02/2021   Procedure: HARDWARE REMOVAL;  Surgeon: Altamese [place], MD;  Location: [place];  Service: Orthopedics;  Laterality: Right;   I & D EXTREMITY Left 12/14/2012   Procedure: IRRIGATION AND DEBRIDEMENT  EXTREMITY;  Surgeon: [name], MD;  Location: [place];  Service: Orthopedics;  Laterality: Left;   ORIF ANKLE FRACTURE Left 12/14/2012   Procedure: OPEN REDUCTION INTERNAL FIXATION (ORIF) ANKLE FRACTURE;  Surgeon: [name], MD;  Location: [place];  Service: Orthopedics;  Laterality: Left;   ORIF TIBIA FRACTURE Bilateral 12/23/2012   Procedure: OPEN REDUCTION INTERNAL FIXATION (ORIF) BILATERAL DISTAL TIBIA FRACTURE;  Surgeon: [name], MD;  Location: [place];  Service: Orthopedics;  Laterality: Bilateral;   Past Surgical History:  Procedure Laterality Date   APPENDECTOMY     BONE EXCISION Right 02/02/2021   Procedure: OSTEOPHYTE EXCISION;  Surgeon: Altamese [place], MD;  Location: [place];  Service: Orthopedics;  Laterality: Right;   Ceasarean section  2007, 2010,2014   EXTERNAL FIXATION LEG Bilateral 12/14/2012   Procedure: EXTERNAL FIXATION LEG;  Surgeon: [name], MD;  Location: [place];  Service: Orthopedics;  Laterality: Bilateral;   EXTERNAL FIXATION REMOVAL Bilateral 12/23/2012   Procedure: REMOVAL EXTERNAL FIXATION LEG;  Surgeon: [name], MD;  Location: [place];  Service: Orthopedics;  Laterality: Bilateral;   HARDWARE REMOVAL Right 02/02/2021   Procedure: HARDWARE REMOVAL;  Surgeon: Altamese [place], MD;  Location: [place];  Service: Orthopedics;  Laterality: Right;   I & D EXTREMITY Left 12/14/2012   Procedure: IRRIGATION AND DEBRIDEMENT EXTREMITY;  Surgeon: [name], MD;  Location: [place];  Service: Orthopedics;  Laterality: Left;   ORIF ANKLE FRACTURE Left 12/14/2012   Procedure: OPEN REDUCTION INTERNAL FIXATION (ORIF) ANKLE FRACTURE;  Surgeon: [name], MD;  Location: [place];  Service: Orthopedics;  Laterality: Left;   ORIF TIBIA FRACTURE Bilateral 12/23/2012   Procedure: OPEN REDUCTION INTERNAL FIXATION (ORIF) BILATERAL DISTAL TIBIA FRACTURE;  Surgeon: [name], MD;  Location: [place];  Service: Orthopedics;  Laterality: Bilateral;   Past Medical  History:  Diagnosis Date   Anemia    Anxiety    Cervicalgia    Chronic migraine with aura    Chronic pain syndrome    Cognitive deficit as late effect of traumatic brain injury ([place])    Depression    H/O cervical fracture    Heart murmur    Menorrhagia    TBI (traumatic brain injury)    BP 108/74    Pulse 84    Temp 98.4 F (36.9 C)    Ht '5\' 5"'$  (1.651 m)    Wt 199 lb 9.6 oz (90.5 kg)    LMP 12/05/2021    SpO2 98%    BMI 33.22 kg/m   Opioid Risk Score:   Fall Risk Score:  `1  Depression screen PHQ 2/9  Depression screen [facility] 2/9 12/12/2021 10/12/2021 08/17/2021 07/25/2021 06/20/2021 04/12/2021 12/08/2020  Decreased Interest 0 0 0 1 0 0 0  Down, Depressed, Hopeless 0 0  0 1 0 0 0  PHQ - 2 Score 0 0 0 2 0 0 0  Altered sleeping - - - 2 - - -  Tired, decreased energy - - - 1 - - -  Change in appetite - - - 0 - - -  Feeling bad or failure about yourself  - - - 1 - - -  Trouble concentrating - - - 1 - - -  Moving slowly or fidgety/restless - - - 0 - - -  Suicidal thoughts - - - 0 - - -  PHQ-9 Score - - - 7 - - -     Review of Systems  Constitutional: Negative.   HENT: Negative.    Eyes: Negative.   Respiratory: Negative.    Cardiovascular: Negative.   Gastrointestinal: Negative.   Endocrine: Negative.   Genitourinary: Negative.   Musculoskeletal:  Positive for back pain and neck pain.       Right leg pain  Skin: Negative.   Allergic/Immunologic: Negative.   Neurological: Negative.   Hematological: Negative.   Psychiatric/Behavioral: Negative.    All other systems reviewed and are negative.     Objective:   Physical Exam Vitals and nursing note reviewed.  Constitutional:      Appearance: Normal appearance.  Cardiovascular:     Rate and Rhythm: Normal rate and regular rhythm.     Pulses: Normal pulses.     Heart sounds: Normal heart sounds.  Pulmonary:     Effort: Pulmonary effort is normal.     Breath sounds: Normal breath sounds.  Musculoskeletal:      Cervical back: Normal range of motion and neck supple.     Comments: Normal Muscle Bulk and Muscle Testing Reveals:  Upper Extremities: Full ROM and Muscle Strength  5/5 Thoracic Paraspinal Tenderness: T-1-T-3 Lower Extremities: Full ROM and Muscle Strength 5/5 Arises from Table with ease Narrow Based  Gait     Skin:    General: Skin is warm and dry.  Neurological:     Mental Status: She is alert and oriented to person, place, and time.  Psychiatric:        Mood and Affect: Mood normal.        Behavior: Behavior normal.         Assessment & Plan:  1.TBI:Cognitive deficit: Continue to Monitor. 12/12/2021  Continue with current treatment: using the various devices to help with your Organization such as notes, calendars and using her cell phone to set reminders. 2. Chronic Pain Syndrome: Continue with  slow weaning of Oxycodone. Refilled : Oxycodone 10 mg 1 tablet BID  as needed as needed # 45.  We will continue the opioid monitoring program, this consists of regular clinic visits, examinations, urine drug screen, pill counts as well as use of [place] Controlled Substance Reporting system. A 12 month History has been reviewed on the [place] Controlled Substance Reporting System on 12/12/2021. 3. Bilateral Pilon Fractures:  Continue to Monitor. 12/12/2021 4. Tibial Nerve Injuries: Continue to monitor. 12/12/2021 5. Migraine: No complaints today. Continue to monitor. 12/12/2021 6. Right Foot Pain: S/P  On 02/02/2021 by Dr Marcelino Scot: Ortho Following. OSTEOPHYTE EXCISION Right General  HARDWARE REMOVAL          7. Cervicalgia: Continue HEP as Tolerated. Continue to Monitor.  12/12/2021 8. Numbness Right Upper and Right Lower Extremity : She has an appointment with Dr Naaman Plummer to discuss EMG : we will continue to monitor.   F/U in 2 Months

## 2021-12-13 ENCOUNTER — Ambulatory Visit: Payer: Medicaid Other | Attending: Psychiatry | Admitting: Physical Therapy

## 2021-12-13 ENCOUNTER — Encounter: Payer: Self-pay | Admitting: Physical Therapy

## 2021-12-13 ENCOUNTER — Ambulatory Visit: Payer: Medicaid Other | Admitting: Registered Nurse

## 2021-12-13 DIAGNOSIS — R2681 Unsteadiness on feet: Secondary | ICD-10-CM | POA: Insufficient documentation

## 2021-12-13 DIAGNOSIS — M542 Cervicalgia: Secondary | ICD-10-CM | POA: Insufficient documentation

## 2021-12-13 DIAGNOSIS — M6281 Muscle weakness (generalized): Secondary | ICD-10-CM | POA: Insufficient documentation

## 2021-12-13 DIAGNOSIS — M25511 Pain in right shoulder: Secondary | ICD-10-CM | POA: Diagnosis present

## 2021-12-13 DIAGNOSIS — G8929 Other chronic pain: Secondary | ICD-10-CM | POA: Insufficient documentation

## 2021-12-13 NOTE — Therapy (Signed)
[facility] [address] ?[place], [place], [address] ?Phone: [phone]   Fax:  [phone] ? ?Physical Therapy Treatment ? ?Patient Details  ?Name: Monique [name] ?MRN: [id number] ?Date of Birth: [birth year] ?Referring Provider (PT): Genia Harold ? ? ?Encounter Date: 12/13/2021 ? ? PT End of Session - 12/13/21 1826   ? ? Visit Number 5   ? Authorization Type [employer] Medicaid   ? PT Start Time 1742   ? PT Stop Time 6962   ? PT Time Calculation (min) 50 min   ? Activity Tolerance Patient tolerated treatment well   ? Behavior During Therapy [facility] for tasks assessed/performed   ? ?  ?  ? ?  ? ? ?Past Medical History:  ?Diagnosis Date  ? Anemia   ? Anxiety   ? Cervicalgia   ? Chronic migraine with aura   ? Chronic pain syndrome   ? Cognitive deficit as late effect of traumatic brain injury ([place])   ? Depression   ? H/O cervical fracture   ? Heart murmur   ? Menorrhagia   ? TBI (traumatic brain injury)   ? ? ?Past Surgical History:  ?Procedure Laterality Date  ? APPENDECTOMY    ? BONE EXCISION Right 02/02/2021  ? Procedure: OSTEOPHYTE EXCISION;  Surgeon: Altamese [place], MD;  Location: [place];  Service: Orthopedics;  Laterality: Right;  ? Ceasarean section  2007, 2010,2014  ? EXTERNAL FIXATION LEG Bilateral 12/14/2012  ? Procedure: EXTERNAL FIXATION LEG;  Surgeon: [name], MD;  Location: [place];  Service: Orthopedics;  Laterality: Bilateral;  ? EXTERNAL FIXATION REMOVAL Bilateral 12/23/2012  ? Procedure: REMOVAL EXTERNAL FIXATION LEG;  Surgeon: [name], MD;  Location: [place];  Service: Orthopedics;  Laterality: Bilateral;  ? HARDWARE REMOVAL Right 02/02/2021  ? Procedure: HARDWARE REMOVAL;  Surgeon: Altamese [place], MD;  Location: [place];  Service: Orthopedics;  Laterality: Right;  ? I & D EXTREMITY Left 12/14/2012  ? Procedure: IRRIGATION AND DEBRIDEMENT EXTREMITY;  Surgeon: [name], MD;  Location: [facility] [place];  Service: Orthopedics;  Laterality: Left;  ? ORIF ANKLE  FRACTURE Left 12/14/2012  ? Procedure: OPEN REDUCTION INTERNAL FIXATION (ORIF) ANKLE FRACTURE;  Surgeon: [name], MD;  Location: [place];  Service: Orthopedics;  Laterality: Left;  ? ORIF TIBIA FRACTURE Bilateral 12/23/2012  ? Procedure: OPEN REDUCTION INTERNAL FIXATION (ORIF) BILATERAL DISTAL TIBIA FRACTURE;  Surgeon: [name], MD;  Location: [place];  Service: Orthopedics;  Laterality: Bilateral;  ? ? ?There were no vitals filed for this visit. ? ? Subjective Assessment - 12/13/21 1744   ? ? Subjective Patient has not been in to see [place] for 2 months, she reports having a new job and has not been able to get off, reports an issue of the right rm going numb and did not work right, she went to ED, they ran some tests and they were inconclusive   ? Currently in Pain? Yes   ? Pain Score 4    ? Pain Location Neck   ? Pain Descriptors / Indicators Sore;Spasm;Tightness   ? Aggravating Factors  stress, sitting, giving daughter a bath   ? ?  ?  ? ?  ? ? ? ? ? ? ? ? ? ? ? ? ? ? ? ? ? ? ? ? [facility] Adult PT Treatment/Exercise - 12/13/21 0001   ? ?  ? Posture/Postural Control  ? Posture Comments continues to elevate the shoulders and has slouched  sitting posture   ?  ? Shoulder Exercises: Standing  ? Horizontal ABduction Both;20 reps;Theraband   ? Theraband Level (Shoulder Horizontal ABduction) Level 2 (Red)   ? External Rotation Strengthening;Both;20 reps;Theraband   ? Theraband Level (Shoulder External Rotation) Level 2 (Red)   ? Extension Strengthening;20 reps;Both   ? Theraband Level (Shoulder Extension) Level 2 (Red)   ? Row Strengthening;20 reps;Theraband;Both   ? Theraband Level (Shoulder Row) Level 2 (Red)   ?  ? Shoulder Exercises: Stretch  ? Corner Stretch 3 reps;10 seconds   ? Cross Chest Stretch 3 reps;20 seconds   ? Star Gazer Stretch 3 reps;10 seconds   ?  ? Manual Therapy  ? Soft tissue mobilization Cervical para spinales UT, rhomdoid area   ? Passive ROM cervical side bends to L for stretch   ? ?  ?  ? ?   ? ? ? ? ? ? ? ? ? ? ? ? PT Short Term Goals - 09/19/21 1011   ? ?  ? PT SHORT TERM GOAL #1  ? Title I with basic HEP   ? Status Partially Met   ? ?  ?  ? ?  ? ? ? ? PT Long Term Goals - 12/13/21 1831   ? ?  ? PT LONG TERM GOAL #1  ? Title I with advanced HEP   ? Status On-going   ?  ? PT LONG TERM GOAL #2  ? Title Patient will report cervical/shoulder pain < 4/10 after at least 1 hour of standing/walking activitiy.   ? Status Partially Met   ?  ? PT LONG TERM GOAL #3  ? Title Increase B UE strength to at least 4-/5, not limited by painful neck.   ? Status Partially Met   ?  ? PT LONG TERM GOAL #4  ? Title Patient will verbalize and demosntrate improved posture with more neutral pelvis, natural thoracic kyphosis, and scapulae flat on thorax.   ? Status On-going   ? ?  ?  ? ?  ? ? ? ? ? ? ? ? Plan - 12/13/21 1828   ? ? Clinical Impression Statement Patient has not been in to PT in 2 months, she reports the holidays and starting a new job, we had issues as well with seeing her, requested the 3 visits fo rthe inital authorization and then could not request more due to the end of the year, then the new year started and we could only request 3 visits again.  She reports an issue last week with a visit to the ED with some confusion and with the right UE having tingling and having difficulty using, BP was okay, she reports an EMG may be done in the future and they are looking at blood work, she does have significant knots in the upper traps and the cervical mms.  She reports that with the new job she is thinking about her posture more and tryiing to do better, however she tends to continue to elevate the shoulders, exacerbating the spasms in the upper traps and the neck.  Cervical ROM is WFL's but tight at end ranges   ? PT Treatment/Interventions ADLs/Self Care Home Management;Electrical Stimulation;Moist Heat;Iontophoresis 81m/ml Dexamethasone;Gait training;Patient/family education;Functional mobility  training;Therapeutic activities;Therapeutic exercise;Balance training;Neuromuscular re-education;Manual techniques;Dry needling;Passive range of motion   ? PT Next Visit Plan cervical ROM, strength, posture   ? Consulted and Agree with Plan of Care Patient   ? ?  ?  ? ?  ? ? ?  Patient will benefit from skilled therapeutic intervention in order to improve the following deficits and impairments:  Pain, Decreased range of motion, Difficulty walking, Abnormal gait, Increased fascial restricitons, Decreased endurance, Decreased activity tolerance, Decreased balance, Decreased mobility, Decreased strength, Impaired sensation, Postural dysfunction, Impaired flexibility, Improper body mechanics ? ?Visit Diagnosis: ?Cervicalgia ? ?Chronic right shoulder pain ? ?Unsteadiness on feet ? ?Muscle weakness (generalized) ? ? ? ? ?Problem List ?Patient Active Problem List  ? Diagnosis Date Noted  ? Fibroids 10/19/2020  ? History of uterine fibroid 08/12/2020  ? Menorrhagia with irregular cycle 08/12/2020  ? Gastroesophageal reflux disease without esophagitis 06/02/2020  ? Keloid of skin 06/02/2020  ? Vitamin D deficiency 04/29/2019  ? Iron deficiency anemia   ? Iliotibial band syndrome affecting right lower leg 11/05/2018  ? Therapeutic opioid induced constipation 11/05/2018  ? Chronic pain syndrome 05/12/2018  ? Cognitive deficit as late effect of traumatic brain injury ([place]) 08/31/2014  ? TBI (traumatic brain injury) 09/16/2013  ? Tibial nerve lesion 03/31/2013  ? Neck mass 02/06/2013  ? New onset of headaches due to trauma 01/02/2013  ? Migraine 12/21/2012  ? Concussion 12/18/2012  ? Closed C2 fracture ([place]) 12/15/2012  ? Fracture, sternum closed 12/15/2012  ? Bilateral ankle fractures 12/15/2012  ? Pulmonary contusion 12/15/2012  ? ? Sumner Boast, PT ?12/13/2021, 6:36 PM ? ?[facility] [address] ?[place], [place], [address] ?Phone: [phone]   Fax:   [phone] ? ?Name: Monique [name] ?MRN: [id number] ?Date of Birth: 06-23-1986 ? ? ? ?

## 2021-12-25 ENCOUNTER — Other Ambulatory Visit: Payer: Self-pay

## 2021-12-25 ENCOUNTER — Ambulatory Visit: Payer: Medicaid Other | Admitting: Physical Therapy

## 2021-12-25 ENCOUNTER — Encounter: Payer: Self-pay | Admitting: Physical Therapy

## 2021-12-25 DIAGNOSIS — M6281 Muscle weakness (generalized): Secondary | ICD-10-CM

## 2021-12-25 DIAGNOSIS — G8929 Other chronic pain: Secondary | ICD-10-CM

## 2021-12-25 DIAGNOSIS — M542 Cervicalgia: Secondary | ICD-10-CM | POA: Diagnosis not present

## 2021-12-25 DIAGNOSIS — R2681 Unsteadiness on feet: Secondary | ICD-10-CM
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
## 2022-01-02 NOTE — Progress Notes (Deleted)
? ?Subjective:  ? ? Patient ID: Monique [name], female    DOB: 10-22-1985, 36 y.o.   MRN: [id number] ? ?HPI ? ?Pain Inventory ?Average Pain {NUMBERS; 0-10:5044} ?Pain Right Now {NUMBERS; 0-10:5044} ?My pain is {PAIN DESCRIPTION:21022940} ? ?In the last 24 hours, has pain interfered with the following? ?General activity {NUMBERS; 0-10:5044} ?Relation with others {NUMBERS; 0-10:5044} ?Enjoyment of life {NUMBERS; 0-10:5044} ?What TIME of day is your pain at its worst? {time of day:24191} ?Sleep (in general) {BHH GOOD/FAIR/POOR:22877} ? ?Pain is worse with: {ACTIVITIES:21022942} ?Pain improves with: {PAIN IMPROVES IHKV:42595638} ?Relief from Meds: {NUMBERS; 0-10:5044} ? ?Family History  ?Problem Relation Age of Onset  ? Diabetes Father   ? Cancer Paternal Grandmother   ?     Ovarian  ? ?Social History  ? ?Socioeconomic History  ? Marital status: Single  ?  Spouse name: Not on file  ? Number of children: 3  ? Years of education: Not on file  ? Highest education level: Some college, no degree  ?Occupational History  ? Occupation: Disability  ?Tobacco Use  ? Smoking status: Never  ?  Passive exposure: Yes  ? Smokeless tobacco: Never  ?Vaping Use  ? Vaping Use: Never used  ?Substance and Sexual Activity  ? Alcohol use: Not Currently  ?  Comment: occasional  ? Drug use: No  ? Sexual activity: Yes  ?  Birth control/protection: None  ?Other Topics Concern  ? Not on file  ?Social History Narrative  ? Not on file  ? ?Social Determinants of Health  ? ?Financial Resource Strain: Not on file  ?Food Insecurity: Not on file  ?Transportation Needs: Not on file  ?Physical Activity: Not on file  ?Stress: Not on file  ?Social Connections: Not on file  ? ?Past Surgical History:  ?Procedure Laterality Date  ? APPENDECTOMY    ? BONE EXCISION Right 02/02/2021  ? Procedure: OSTEOPHYTE EXCISION;  Surgeon: Altamese [place], MD;  Location: [facility];  Service: Orthopedics;  Laterality: Right;  ? Ceasarean section  2007, 2010,2014  ? EXTERNAL  FIXATION LEG Bilateral 12/14/2012  ? Procedure: EXTERNAL FIXATION LEG;  Surgeon: [name], MD;  Location: [facility];  Service: Orthopedics;  Laterality: Bilateral;  ? EXTERNAL FIXATION REMOVAL Bilateral 12/23/2012  ? Procedure: REMOVAL EXTERNAL FIXATION LEG;  Surgeon: [name], MD;  Location: [facility];  Service: Orthopedics;  Laterality: Bilateral;  ? HARDWARE REMOVAL Right 02/02/2021  ? Procedure: HARDWARE REMOVAL;  Surgeon: Altamese [place], MD;  Location: [facility];  Service: Orthopedics;  Laterality: Right;  ? I & D EXTREMITY Left 12/14/2012  ? Procedure: IRRIGATION AND DEBRIDEMENT EXTREMITY;  Surgeon: [name], MD;  Location: [facility];  Service: Orthopedics;  Laterality: Left;  ? ORIF ANKLE FRACTURE Left 12/14/2012  ? Procedure: OPEN REDUCTION INTERNAL FIXATION (ORIF) ANKLE FRACTURE;  Surgeon: [name], MD;  Location: [facility];  Service: Orthopedics;  Laterality: Left;  ? ORIF TIBIA FRACTURE Bilateral 12/23/2012  ? Procedure: OPEN REDUCTION INTERNAL FIXATION (ORIF) BILATERAL DISTAL TIBIA FRACTURE;  Surgeon: [name], MD;  Location: [facility];  Service: Orthopedics;  Laterality: Bilateral;  ? ?Past Surgical History:  ?Procedure Laterality Date  ? APPENDECTOMY    ? BONE EXCISION Right 02/02/2021  ? Procedure: OSTEOPHYTE EXCISION;  Surgeon: Altamese McClain, MD;  Location: [facility];  Service: Orthopedics;  Laterality: Right;  ? Ceasarean section  2007, 2010,2014  ? EXTERNAL FIXATION LEG Bilateral 12/14/2012  ? Procedure: EXTERNAL FIXATION LEG;  Surgeon: [name], MD;  Location:  [facility];  Service: Orthopedics;  Laterality: Bilateral;  ? EXTERNAL FIXATION REMOVAL Bilateral 12/23/2012  ? Procedure: REMOVAL EXTERNAL FIXATION LEG;  Surgeon: [name], MD;  Location: [facility];  Service: Orthopedics;  Laterality: Bilateral;  ? HARDWARE REMOVAL Right 02/02/2021  ? Procedure: HARDWARE REMOVAL;  Surgeon: Altamese [place], MD;  Location: [facility];  Service: Orthopedics;  Laterality: Right;  ? I & D EXTREMITY Left 12/14/2012  ?  Procedure: IRRIGATION AND DEBRIDEMENT EXTREMITY;  Surgeon: [name], MD;  Location: [facility];  Service: Orthopedics;  Laterality: Left;  ? ORIF ANKLE FRACTURE Left 12/14/2012  ? Procedure: OPEN REDUCTION INTERNAL FIXATION (ORIF) ANKLE FRACTURE;  Surgeon: [name], MD;  Location: [facility];  Service: Orthopedics;  Laterality: Left;  ? ORIF TIBIA FRACTURE Bilateral 12/23/2012  ? Procedure: OPEN REDUCTION INTERNAL FIXATION (ORIF) BILATERAL DISTAL TIBIA FRACTURE;  Surgeon: [name], MD;  Location: [facility];  Service: Orthopedics;  Laterality: Bilateral;  ? ?Past Medical History:  ?Diagnosis Date  ? Anemia   ? Anxiety   ? Cervicalgia   ? Chronic migraine with aura   ? Chronic pain syndrome   ? Cognitive deficit as late effect of traumatic brain injury ([place])   ? Depression   ? H/O cervical fracture   ? Heart murmur   ? Menorrhagia   ? TBI (traumatic brain injury)   ? ?LMP 12/05/2021  ? ?Opioid Risk Score:   ?Fall Risk Score:  `1 ? ?Depression screen PHQ 2/9 ? ? ?  12/12/2021  ?  8:36 AM 10/12/2021  ? 11:05 AM 08/17/2021  ? 10:55 AM 07/25/2021  ?  3:23 PM 06/20/2021  ? 11:52 AM 04/12/2021  ? 10:04 AM 12/08/2020  ? 10:05 AM  ?Depression screen PHQ 2/9  ?Decreased Interest 0 0 0 1 0 0 0  ?Down, Depressed, Hopeless 0 0 0 1 0 0 0  ?PHQ - 2 Score 0 0 0 2 0 0 0  ?Altered sleeping    2     ?Tired, decreased energy    1     ?Change in appetite    0     ?Feeling bad or failure about yourself     1     ?Trouble concentrating    1     ?Moving slowly or fidgety/restless    0     ?Suicidal thoughts    0     ?PHQ-9 Score    7     ?  ? ?Review of Systems ? ?   ?Objective:  ? Physical Exam ? ? ? ? ?   ?Assessment & Plan:  ? ? ?

## 2022-01-03 ENCOUNTER — Encounter: Payer: Medicaid Other | Admitting: Physical Medicine & Rehabilitation

## 2022-03-15 ENCOUNTER — Ambulatory Visit: Payer: Self-pay
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
## 2022-04-11 ENCOUNTER — Ambulatory Visit: Payer: Medicaid Other | Admitting: Physician Assistant

## 2022-05-17 ENCOUNTER — Ambulatory Visit: Payer: Self-pay

## 2022-05-17 NOTE — Telephone Encounter (Signed)
   Chief Complaint: Continued right hand numbness, weakness. No show last appointment. Symptoms: Numbness, weakness, neck pain Frequency: 2 months ago Pertinent Negatives: Patient denies SOB,chest pain Disposition: '[]'$ ED /'[]'$ Urgent Care (no appt availability in office) / '[]'$ Appointment(In office/virtual)/ '[]'$  [facility]/ '[]'$ Home Care/ '[]'$ Refused Recommended Disposition /'[]'$ [facility]/ '[x]'$  Follow-up with PCP Additional Notes: Declines mobile unit, asking to be worked in. Please advise pt.   Answer Assessment - Initial Assessment Questions 1. SYMPTOM: "What is the main symptom you are concerned about?" (e.g., weakness, numbness)     Right hand numbness 2. ONSET: "When did this start?" (minutes, hours, days; while sleeping)     2 months ago 3. LAST NORMAL: "When was the last time you (the patient) were normal (no symptoms)?"     2 months ago 4. PATTERN "Does this come and go, or has it been constant since it started?"  "Is it present now?"     Comes and goes 5. CARDIAC SYMPTOMS: "Have you had any of the following symptoms: chest pain, difficulty breathing, palpitations?"     Chest pain at times 6. NEUROLOGIC SYMPTOMS: "Have you had any of the following symptoms: headache, dizziness, vision loss, double vision, changes in speech, unsteady on your feet?"     Weakness to right hand 7. OTHER SYMPTOMS: "Do you have any other symptoms?"     No 8. PREGNANCY: "Is there any chance you are pregnant?" "When was your last menstrual period?"     No  Protocols used: Neurologic Deficit-A-AH

## 2022-05-18 NOTE — Telephone Encounter (Signed)
Pt was called and given in person appointment to discuss concerns

## 2022-06-01 ENCOUNTER — Ambulatory Visit: Payer: Medicaid Other | Attending: Internal Medicine | Admitting: Internal Medicine

## 2022-06-01 ENCOUNTER — Encounter: Payer: Self-pay | Admitting: Internal Medicine

## 2022-06-01 VITALS — BP 111/72 | HR 79 | Resp 16 | Wt 200.2 lb

## 2022-06-01 DIAGNOSIS — M25511 Pain in right shoulder: Secondary | ICD-10-CM | POA: Diagnosis not present

## 2022-06-01 DIAGNOSIS — X58XXXA Exposure to other specified factors, initial encounter: Secondary | ICD-10-CM | POA: Insufficient documentation

## 2022-06-01 DIAGNOSIS — R200 Anesthesia of skin: Secondary | ICD-10-CM | POA: Diagnosis not present

## 2022-06-01 DIAGNOSIS — S82891A Other fracture of right lower leg, initial encounter for closed fracture: Secondary | ICD-10-CM | POA: Diagnosis not present

## 2022-06-01 DIAGNOSIS — M25512 Pain in left shoulder: Secondary | ICD-10-CM | POA: Diagnosis not present

## 2022-06-01 DIAGNOSIS — R42 Dizziness and giddiness: Secondary | ICD-10-CM | POA: Diagnosis not present

## 2022-06-01 DIAGNOSIS — E559 Vitamin D deficiency, unspecified: Secondary | ICD-10-CM | POA: Diagnosis not present

## 2022-06-01 DIAGNOSIS — S060XAA Concussion with loss of consciousness status unknown, initial encounter: Secondary | ICD-10-CM | POA: Diagnosis present

## 2022-06-01 DIAGNOSIS — D509 Iron deficiency anemia, unspecified: Secondary | ICD-10-CM | POA: Insufficient documentation

## 2022-06-01 MED ORDER — FERROUS SULFATE 325 (65 FE) MG PO TABS: 325.0000 mg | ORAL_TABLET | Freq: Every day | ORAL | 1 refills | Status: AC

## 2022-06-01 NOTE — Progress Notes (Signed)
Pt states her symptoms has been going on for a couple of months and it comes and goes   Pt states she has lack of energy and she be feeling like she is going to pass out
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
## 2022-06-02 LAB — CBC
Hematocrit: 32.2 % — ABNORMAL LOW (ref 34.0–46.6)
Hemoglobin: 9.4 g/dL — ABNORMAL LOW (ref 11.1–15.9)
MCH: 20.6 pg — ABNORMAL LOW (ref 26.6–33.0)
MCHC: 29.2 g/dL — ABNORMAL LOW (ref 31.5–35.7)
MCV: 71 fL — ABNORMAL LOW (ref 79–97)
Platelets: 404 10*3/uL (ref 150–450)
RBC: 4.57 x10E6/uL (ref 3.77–5.28)
RDW: 18.2 % — ABNORMAL HIGH (ref 11.7–15.4)
WBC: 6.7 10*3/uL (ref 3.4–10.8)

## 2022-06-02 LAB — VITAMIN D 25 HYDROXY (VIT D DEFICIENCY, FRACTURES): Vit D, 25-Hydroxy: 22.9 ng/mL — ABNORMAL LOW (ref 30.0–100.0)

## 2022-06-19 ENCOUNTER — Encounter
Payer: Medicaid Other | Attending: Physical Medicine and Rehabilitation | Admitting: Physical Medicine and Rehabilitation

## 2022-06-19 ENCOUNTER — Encounter: Payer: Self-pay | Admitting: Physical Medicine and Rehabilitation

## 2022-06-19 VITALS — BP 115/78 | HR 77 | Ht 65.0 in | Wt 205.6 lb

## 2022-06-19 DIAGNOSIS — T781XXA Other adverse food reactions, not elsewhere classified, initial encounter: Secondary | ICD-10-CM | POA: Insufficient documentation

## 2022-06-19 DIAGNOSIS — E669 Obesity, unspecified: Secondary | ICD-10-CM | POA: Insufficient documentation

## 2022-06-19 DIAGNOSIS — R5383 Other fatigue: Secondary | ICD-10-CM | POA: Insufficient documentation

## 2022-06-19 DIAGNOSIS — Z6834 Body mass index (BMI) 34.0-34.9, adult: Secondary | ICD-10-CM | POA: Diagnosis present

## 2022-06-19 DIAGNOSIS — E559 Vitamin D deficiency, unspecified: Secondary | ICD-10-CM | POA: Insufficient documentation

## 2022-06-19 DIAGNOSIS — M542 Cervicalgia: Secondary | ICD-10-CM | POA: Insufficient documentation
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
## 2022-06-19 NOTE — Patient Instructions (Addendum)
Bioptemizer's Magnesium 7 types of magnesium  Magneesium gluconate '500mg'$  HS  Pumpkin seed are a great magnesium Blue or green spirulina  Iron: green leafy vegetables, lean and organic meats  -Discussed following foods that may reduce pain: 1) Ginger (especially studied for arthritis)- reduce leukotriene production to decrease inflammation 2) Blueberries- high in phytonutrients that decrease inflammation 3) Salmon- marine omega-3s reduce joint swelling and pain 4) Pumpkin seeds- reduce inflammation 5) dark chocolate- reduces inflammation 6) turmeric- reduces inflammation 7) tart cherries - reduce pain and stiffness 8) extra virgin olive oil - its compound olecanthal helps to block prostaglandins  9) chili peppers- can be eaten or applied topically via capsaicin 10) mint- helpful for headache, muscle aches, joint pain, and itching 11) garlic- reduces inflammation  Link to further information on diet for chronic pain: http://www.randall.com/    Simply Organic Organic black pepper or black white pepper   Turmeric to reduce inflammation--can be used in cooking or taken as a supplement.  Benefits of turmeric:  -Highly anti-inflammatory  -Increases antioxidants  -Improves memory, attention, brain disease  -Lowers risk of heart disease  -May help prevent cancer  -Decreases pain  -Alleviates depression  -Delays aging and decreases risk of chronic disease  -Consume with black pepper to increase absorption    Turmeric Milk Recipe:  1 cup milk  1 tsp turmeric  1 tsp cinnamon  1 tsp grated ginger (optional)  Black pepper (boosts the anti-inflammatory properties of turmeric).  1 tsp honey

## 2022-06-20 ENCOUNTER — Encounter (HOSPITAL_BASED_OUTPATIENT_CLINIC_OR_DEPARTMENT_OTHER): Payer: Medicaid Other | Admitting: Physical Medicine and Rehabilitation

## 2022-06-20 DIAGNOSIS — R5383 Other fatigue: Secondary | ICD-10-CM | POA: Diagnosis not present

## 2022-06-20 DIAGNOSIS — E559 Vitamin D deficiency, unspecified: Secondary | ICD-10-CM | POA: Diagnosis not present

## 2022-06-20 LAB — B12 AND FOLATE PANEL
Folate: 13 ng/mL (ref >3.0)
Vitamin B-12: 801 pg/mL (ref 232–1245)

## 2022-06-20 LAB — MAGNESIUM: Magnesium: 2.1 mg/dL (ref 1.6–2.3)

## 2022-06-20 LAB — VITAMIN D 25 HYDROXY (VIT D DEFICIENCY, FRACTURES): Vit D, 25-Hydroxy: 21.9 ng/mL — ABNORMAL LOW (ref 30.0–100.0)

## 2022-06-20 MED ORDER — NEEDLES & SYRINGES MISC: 1.0000 | 11 refills | Status: DC

## 2022-06-20 MED ORDER — VITAMIN D (ERGOCALCIFEROL) 1.25 MG (50000 UNIT) PO CAPS: 50000.0000 [IU] | ORAL_CAPSULE | ORAL | 0 refills | Status: DC

## 2022-06-20 MED ORDER — CYANOCOBALAMIN 1000 MCG/ML IJ SOLN: 1000.0000 ug | INTRAMUSCULAR | 11 refills | Status: DC

## 2022-06-20 NOTE — Progress Notes (Addendum)
Subjective:    Patient ID: Monique [name], female    DOB: 02-Dec-1985, 36 y.o.   MRN: [id number]  HPI An audio/video tele-health visit is felt to be the most appropriate encounter for this patient at this time. This is a follow up tele-visit via phone. The patient is at home. MD is at office. Prior to scheduling this appointment, our staff discussed the limitations of evaluation and management by telemedicine and the availability of in-person appointments. The patient expressed understanding and agreed to proceed.   Monique [name] is a 36 year old woman who presents for follow-up nutritional counseling for her pain.  1) Cervical myofascial pain -she currently has bilateral upper back and neck myofascial pain after sustaining a C2 fracture -she follows with Dr. Naaman Plummer and Zella Ball and is interested in non-medication options for her pain.  -she has not heard of extracorporeal shockwave therapy but would be interested in trying this -she is interested in physical therapy as well. -she has heard of turmeric but does not know where to get it  2) Food sensitivity -she notes sensitivity to certain foods, she thinks dairy in particular -she would like to be tested for food sensitivities  3) Vitamin D deficiency:  -she is interested in taking high dose supplement.   4) Fatigue -she is interested in trying B12 injections   Pain Inventory Average Pain 6 Pain Right Now 3 My pain is sharp, tingling, and aching  In the last 24 hours, has pain interfered with the following? General activity 6 Relation with others 6 Enjoyment of life 6 What TIME of day is your pain at its worst? daytime, evening, and night Sleep (in general) Fair  Pain is worse with: walking, bending, and some activites Pain improves with: rest, heat/ice, and pacing activities Relief from Meds:  no meds  Family History  Problem Relation Age of Onset   Diabetes Father    Cancer Paternal Grandmother        Ovarian    Social History   Socioeconomic History   Marital status: Single    Spouse name: Not on file   Number of children: 3   Years of education: Not on file   Highest education level: Some college, no degree  Occupational History   Occupation: Disability  Tobacco Use   Smoking status: Never    Passive exposure: Yes   Smokeless tobacco: Never  Vaping Use   Vaping Use: Never used  Substance and Sexual Activity   Alcohol use: Not Currently    Comment: occasional   Drug use: No   Sexual activity: Yes    Birth control/protection: None  Other Topics Concern   Not on file  Social History Narrative   Not on file   Social Determinants of Health   Financial Resource Strain: Not on file  Food Insecurity: Not on file  Transportation Needs: Not on file  Physical Activity: Not on file  Stress: Not on file  Social Connections: Not on file   Past Surgical History:  Procedure Laterality Date   APPENDECTOMY     BONE EXCISION Right 02/02/2021   Procedure: OSTEOPHYTE EXCISION;  Surgeon: Altamese [place], MD;  Location: [place];  Service: Orthopedics;  Laterality: Right;   Ceasarean section  2007, 2010,2014   EXTERNAL FIXATION LEG Bilateral 12/14/2012   Procedure: EXTERNAL FIXATION LEG;  Surgeon: [name], MD;  Location: [place];  Service: Orthopedics;  Laterality: Bilateral;   EXTERNAL FIXATION REMOVAL Bilateral 12/23/2012   Procedure: REMOVAL EXTERNAL  FIXATION LEG;  Surgeon: [name], MD;  Location: [place];  Service: Orthopedics;  Laterality: Bilateral;   HARDWARE REMOVAL Right 02/02/2021   Procedure: HARDWARE REMOVAL;  Surgeon: Altamese [place], MD;  Location: [place];  Service: Orthopedics;  Laterality: Right;   I & D EXTREMITY Left 12/14/2012   Procedure: IRRIGATION AND DEBRIDEMENT EXTREMITY;  Surgeon: [name], MD;  Location: [place];  Service: Orthopedics;  Laterality: Left;   ORIF ANKLE FRACTURE Left 12/14/2012   Procedure: OPEN REDUCTION INTERNAL FIXATION (ORIF) ANKLE FRACTURE;   Surgeon: [name], MD;  Location: [place];  Service: Orthopedics;  Laterality: Left;   ORIF TIBIA FRACTURE Bilateral 12/23/2012   Procedure: OPEN REDUCTION INTERNAL FIXATION (ORIF) BILATERAL DISTAL TIBIA FRACTURE;  Surgeon: [name], MD;  Location: [place];  Service: Orthopedics;  Laterality: Bilateral;   Past Surgical History:  Procedure Laterality Date   APPENDECTOMY     BONE EXCISION Right 02/02/2021   Procedure: OSTEOPHYTE EXCISION;  Surgeon: Altamese [place], MD;  Location: [place];  Service: Orthopedics;  Laterality: Right;   Ceasarean section  2007, 2010,2014   EXTERNAL FIXATION LEG Bilateral 12/14/2012   Procedure: EXTERNAL FIXATION LEG;  Surgeon: [name], MD;  Location: [place];  Service: Orthopedics;  Laterality: Bilateral;   EXTERNAL FIXATION REMOVAL Bilateral 12/23/2012   Procedure: REMOVAL EXTERNAL FIXATION LEG;  Surgeon: [name], MD;  Location: [place];  Service: Orthopedics;  Laterality: Bilateral;   HARDWARE REMOVAL Right 02/02/2021   Procedure: HARDWARE REMOVAL;  Surgeon: Altamese [place], MD;  Location: [place];  Service: Orthopedics;  Laterality: Right;   I & D EXTREMITY Left 12/14/2012   Procedure: IRRIGATION AND DEBRIDEMENT EXTREMITY;  Surgeon: [name], MD;  Location: [place];  Service: Orthopedics;  Laterality: Left;   ORIF ANKLE FRACTURE Left 12/14/2012   Procedure: OPEN REDUCTION INTERNAL FIXATION (ORIF) ANKLE FRACTURE;  Surgeon: [name], MD;  Location: [place];  Service: Orthopedics;  Laterality: Left;   ORIF TIBIA FRACTURE Bilateral 12/23/2012   Procedure: OPEN REDUCTION INTERNAL FIXATION (ORIF) BILATERAL DISTAL TIBIA FRACTURE;  Surgeon: [name], MD;  Location: [place];  Service: Orthopedics;  Laterality: Bilateral;   Past Medical History:  Diagnosis Date   Anemia    Anxiety    Cervicalgia    Chronic migraine with aura    Chronic pain syndrome    Cognitive deficit as late effect of traumatic brain injury ([place])    Depression    H/O cervical  fracture    Heart murmur    Menorrhagia    TBI (traumatic brain injury) ([place])    There were no vitals taken for this visit.  Opioid Risk Score:   Fall Risk Score:  `1  Depression screen [facility] 2/9     06/19/2022    9:12 AM 06/01/2022    2:36 PM 12/12/2021    8:36 AM 10/12/2021   11:05 AM 08/17/2021   10:55 AM 07/25/2021    3:23 PM 06/20/2021   11:52 AM  Depression screen PHQ 2/9  Decreased Interest 0 0 0 0 0 1 0  Down, Depressed, Hopeless 0 0 0 0 0 1 0  PHQ - 2 Score 0 0 0 0 0 2 0  Altered sleeping      2   Tired, decreased energy      1   Change in appetite      0   Feeling bad or failure about yourself  1   Trouble concentrating      1   Moving slowly or fidgety/restless      0   Suicidal thoughts      0   PHQ-9 Score      7     Review of Systems  Constitutional: Negative.   HENT: Negative.    Eyes: Negative.   Respiratory: Negative.    Cardiovascular: Negative.   Gastrointestinal: Negative.   Endocrine: Negative.   Genitourinary: Negative.   Musculoskeletal:  Positive for neck pain.       Right arm both lower legs  Skin: Negative.   Allergic/Immunologic: Negative.   Neurological: Negative.   Hematological: Negative.   Psychiatric/Behavioral: Negative.    All other systems reviewed and are negative.      Objective:   Physical Exam Not performed      Assessment & Plan:  1) Chronic Pain Syndrome secondary to cervical myofascial pain syndrome -Discussed current symptoms of pain and history of pain.  -Discussed benefits of exercise in reducing pain. -referred to PT for postural correction, myofascial release, stretching and strengthening of muscles of neck and upper back -advised her to start doing arm circles front and backward daily -discussed extracorporeal shockwave therapy and she would like to try next available. -Discussed following foods that may reduce pain: 1) Ginger (especially studied for arthritis)- reduce leukotriene production to decrease  inflammation 2) Blueberries- high in phytonutrients that decrease inflammation 3) Salmon- marine omega-3s reduce joint swelling and pain 4) Pumpkin seeds- reduce inflammation 5) dark chocolate- reduces inflammation 6) turmeric- reduces inflammation 7) tart cherries - reduce pain and stiffness 8) extra virgin olive oil - its compound olecanthal helps to block prostaglandins  9) chili peppers- can be eaten or applied topically via capsaicin 10) mint- helpful for headache, muscle aches, joint pain, and itching 11) garlic- reduces inflammation  Link to further information on diet for chronic pain: http://www.randall.com/   Turmeric to reduce inflammation--can be used in cooking or taken as a supplement.  Benefits of turmeric:  -Highly anti-inflammatory  -Increases antioxidants  -Improves memory, attention, brain disease  -Lowers risk of heart disease  -May help prevent cancer  -Decreases pain  -Alleviates depression  -Delays aging and decreases risk of chronic disease  -Consume with black pepper to increase absorption    Turmeric Milk Recipe:  1 cup milk  1 tsp turmeric  1 tsp cinnamon  1 tsp grated ginger (optional)  Black pepper (boosts the anti-inflammatory properties of turmeric).  1 tsp honey   2) Vitamin D deficiency -prescribed 50,000U ergocalciferol weekly for 20 weeks -discussed other natural sources of vitamin D such as mushrooms, salmon, and sun exposure  3) Obesity -Discussed the benefits of intermittent fasting. Recommended starting with pushing dinner 15 minutes earlier and when this feels easy, continuing to push dinner 15 minutes earlier. Discussed that this can help her body to improve its ability to burn fat rather than glucose, improving insulin sensitivity. Recommended drinking Roobois tea in the evening to help curb appetite and for its numerous health benefits.    4) Food  sensitivities -food sensitivity panel ordered.   5) Fatigue -B12 monthly injections prescribed  5 minutes spent in discussion of vitamin D deficiency, normal magnesium levels, B12 prescription for fatigue.

## 2022-06-20 NOTE — Addendum Note (Signed)
Addended by: Izora Ribas on: 06/20/2022 08:11 PM   Modules accepted: Orders, Level of Service

## 2022-06-20 NOTE — Addendum Note (Signed)
Addended by: Izora Ribas on: 06/20/2022 02:19 PM   Modules accepted: Orders

## 2022-06-21 LAB — ALLERGENS (22) FOODS IGG
Casein IgG: <2 ug/mL (ref 0.0–1.9)
Cheese, Mold Type IgG: 3.5 ug/mL — ABNORMAL HIGH (ref 0.0–1.9)
Chicken IgG: <2 ug/mL (ref 0.0–1.9)
Chili Pepper IgG: 4.2 ug/mL — ABNORMAL HIGH (ref 0.0–1.9)
Chocolate/Cacao IgG: <2 ug/mL (ref 0.0–1.9)
Coffee IgG: <2 ug/mL (ref 0.0–1.9)
Corn IgG: <2 ug/mL (ref 0.0–1.9)
Egg, Whole IgG: <2 ug/mL (ref 0.0–1.9)
Green Bean IgG: <2 ug/mL (ref 0.0–1.9)
Haddock IgG: <2 ug/mL (ref 0.0–1.9)
Lamb IgG: <2 ug/mL (ref 0.0–1.9)
Oat IgG: 2.2 ug/mL — ABNORMAL HIGH (ref 0.0–1.9)
Onion IgG: <2 ug/mL (ref 0.0–1.9)
Peanut IgG: <2 ug/mL (ref 0.0–1.9)
Pork IgG: 2 ug/mL — ABNORMAL HIGH (ref 0.0–1.9)
Potato, White, IgG: <2 ug/mL (ref 0.0–1.9)
Rye IgG: 2.1 ug/mL — ABNORMAL HIGH (ref 0.0–1.9)
Shrimp IgG: <2 ug/mL (ref 0.0–1.9)
Soybean IgG: <2 ug/mL (ref 0.0–1.9)
Tomato IgG: <2 ug/mL (ref 0.0–1.9)
Wheat IgG: <2 ug/mL (ref 0.0–1.9)
Yeast IgG: <2 ug/mL (ref 0.0–1.9)

## 2022-06-22 ENCOUNTER — Encounter (HOSPITAL_BASED_OUTPATIENT_CLINIC_OR_DEPARTMENT_OTHER): Payer: Medicaid Other | Admitting: Physical Medicine and Rehabilitation

## 2022-06-22 DIAGNOSIS — T781XXA Other adverse food reactions, not elsewhere classified, initial encounter: Secondary | ICD-10-CM

## 2022-06-24 NOTE — Progress Notes (Signed)
Subjective:    Patient ID: Monique [name], female    DOB: [birth year], 36 y.o.   MRN: [id number]  HPI An audio/video tele-health visit is felt to be the most appropriate encounter for this patient at this time. This is a follow up tele-visit via phone. The patient is at home. MD is at office. Prior to scheduling this appointment, our staff discussed the limitations of evaluation and management by telemedicine and the availability of in-person appointments. The patient expressed understanding and agreed to proceed.   Monique [name] is a 36 year old woman who presents for f/u nutritional counseling for her pain/GI symptoms  1) Cervical myofascial pain -she currently has bilateral upper back and neck myofascial pain after sustaining a C2 fracture -she follows with Dr. Naaman Plummer and Zella Ball and is interested in non-medication options for her pain.  -she has not heard of extracorporeal shockwave therapy but would be interested in trying this -she is interested in physical therapy as well. -she has heard of turmeric but does not know where to get it  2) Food sensitivity -she notes sensitivity to certain foods, she thinks dairy in particular -she would like to be tested for food sensitivities -she has GI distress at times -she likes to eat cheddar cheese a lot- she usually adds this to her tacos and asks about any possible cheese substitute  3) Vitamin D deficiency:  -she is interested in taking high dose supplement.   4) Fatigue -she is interested in trying B12 injections   Pain Inventory Average Pain 6 Pain Right Now 3 My pain is sharp, tingling, and aching  In the last 24 hours, has pain interfered with the following? General activity 6 Relation with others 6 Enjoyment of life 6 What TIME of day is your pain at its worst? daytime, evening, and night Sleep (in general) Fair  Pain is worse with: walking, bending, and some activites Pain improves with: rest, heat/ice, and pacing  activities Relief from Meds:  no meds  Family History  Problem Relation Age of Onset   Diabetes Father    Cancer Paternal Grandmother        Ovarian   Social History   Socioeconomic History   Marital status: Single    Spouse name: Not on file   Number of children: 3   Years of education: Not on file   Highest education level: Some college, no degree  Occupational History   Occupation: Disability  Tobacco Use   Smoking status: Never    Passive exposure: Yes   Smokeless tobacco: Never  Vaping Use   Vaping Use: Never used  Substance and Sexual Activity   Alcohol use: Not Currently    Comment: occasional   Drug use: No   Sexual activity: Yes    Birth control/protection: None  Other Topics Concern   Not on file  Social History Narrative   Not on file   Social Determinants of Health   Financial Resource Strain: Not on file  Food Insecurity: Not on file  Transportation Needs: Not on file  Physical Activity: Not on file  Stress: Not on file  Social Connections: Not on file   Past Surgical History:  Procedure Laterality Date   APPENDECTOMY     BONE EXCISION Right 02/02/2021   Procedure: OSTEOPHYTE EXCISION;  Surgeon: Altamese Ord, MD;  Location: [place];  Service: Orthopedics;  Laterality: Right;   Ceasarean section  2007, 2010,2014   EXTERNAL FIXATION LEG Bilateral 12/14/2012   Procedure: EXTERNAL FIXATION  LEG;  Surgeon: [name], MD;  Location: [place];  Service: Orthopedics;  Laterality: Bilateral;   EXTERNAL FIXATION REMOVAL Bilateral 12/23/2012   Procedure: REMOVAL EXTERNAL FIXATION LEG;  Surgeon: [name], MD;  Location: [place];  Service: Orthopedics;  Laterality: Bilateral;   HARDWARE REMOVAL Right 02/02/2021   Procedure: HARDWARE REMOVAL;  Surgeon: Altamese [place], MD;  Location: [place];  Service: Orthopedics;  Laterality: Right;   I & D EXTREMITY Left 12/14/2012   Procedure: IRRIGATION AND DEBRIDEMENT EXTREMITY;  Surgeon: [name], MD;  Location: [place];  Service: Orthopedics;  Laterality: Left;   ORIF ANKLE FRACTURE Left 12/14/2012   Procedure: OPEN REDUCTION INTERNAL FIXATION (ORIF) ANKLE FRACTURE;  Surgeon: [name], MD;  Location: [place];  Service: Orthopedics;  Laterality: Left;   ORIF TIBIA FRACTURE Bilateral 12/23/2012   Procedure: OPEN REDUCTION INTERNAL FIXATION (ORIF) BILATERAL DISTAL TIBIA FRACTURE;  Surgeon: [name], MD;  Location: [place];  Service: Orthopedics;  Laterality: Bilateral;   Past Surgical History:  Procedure Laterality Date   APPENDECTOMY     BONE EXCISION Right 02/02/2021   Procedure: OSTEOPHYTE EXCISION;  Surgeon: Altamese [place], MD;  Location: [place];  Service: Orthopedics;  Laterality: Right;   Ceasarean section  2007, 2010,2014   EXTERNAL FIXATION LEG Bilateral 12/14/2012   Procedure: EXTERNAL FIXATION LEG;  Surgeon: [name], MD;  Location: [place];  Service: Orthopedics;  Laterality: Bilateral;   EXTERNAL FIXATION REMOVAL Bilateral 12/23/2012   Procedure: REMOVAL EXTERNAL FIXATION LEG;  Surgeon: [name], MD;  Location: [place];  Service: Orthopedics;  Laterality: Bilateral;   HARDWARE REMOVAL Right 02/02/2021   Procedure: HARDWARE REMOVAL;  Surgeon: Altamese [place], MD;  Location: [place];  Service: Orthopedics;  Laterality: Right;   I & D EXTREMITY Left 12/14/2012   Procedure: IRRIGATION AND DEBRIDEMENT EXTREMITY;  Surgeon: [name], MD;  Location: [place];  Service: Orthopedics;  Laterality: Left;   ORIF ANKLE FRACTURE Left 12/14/2012   Procedure: OPEN REDUCTION INTERNAL FIXATION (ORIF) ANKLE FRACTURE;  Surgeon: [name], MD;  Location: [place];  Service: Orthopedics;  Laterality: Left;   ORIF TIBIA FRACTURE Bilateral 12/23/2012   Procedure: OPEN REDUCTION INTERNAL FIXATION (ORIF) BILATERAL DISTAL TIBIA FRACTURE;  Surgeon: [name], MD;  Location: [place];  Service: Orthopedics;  Laterality: Bilateral;   Past Medical History:  Diagnosis Date   Anemia    Anxiety    Cervicalgia     Chronic migraine with aura    Chronic pain syndrome    Cognitive deficit as late effect of traumatic brain injury ([place])    Depression    H/O cervical fracture    Heart murmur    Menorrhagia    TBI (traumatic brain injury) ([place])    There were no vitals taken for this visit.  Opioid Risk Score:   Fall Risk Score:  `1  Depression screen [facility] 2/9     06/19/2022    9:12 AM 06/01/2022    2:36 PM 12/12/2021    8:36 AM 10/12/2021   11:05 AM 08/17/2021   10:55 AM 07/25/2021    3:23 PM 06/20/2021   11:52 AM  Depression screen PHQ 2/9  Decreased Interest 0 0 0 0 0 1 0  Down, Depressed, Hopeless 0 0 0 0 0 1 0  PHQ - 2 Score 0 0 0 0 0 2 0  Altered sleeping      2   Tired, decreased energy  1   Change in appetite      0   Feeling bad or failure about yourself       1   Trouble concentrating      1   Moving slowly or fidgety/restless      0   Suicidal thoughts      0   PHQ-9 Score      7     Review of Systems  Constitutional: Negative.   HENT: Negative.    Eyes: Negative.   Respiratory: Negative.    Cardiovascular: Negative.   Gastrointestinal: Negative.   Endocrine: Negative.   Genitourinary: Negative.   Musculoskeletal:  Positive for neck pain.       Right arm both lower legs  Skin: Negative.   Allergic/Immunologic: Negative.   Neurological: Negative.   Hematological: Negative.   Psychiatric/Behavioral: Negative.    All other systems reviewed and are negative.      Objective:   Physical Exam Not performed      Assessment & Plan:  1) Chronic Pain Syndrome secondary to cervical myofascial pain syndrome -Discussed current symptoms of pain and history of pain.  -Discussed benefits of exercise in reducing pain. -referred to PT for postural correction, myofascial release, stretching and strengthening of muscles of neck and upper back -advised her to start doing arm circles front and backward daily -discussed extracorporeal shockwave therapy and she would like to try  next available. -Discussed following foods that may reduce pain: 1) Ginger (especially studied for arthritis)- reduce leukotriene production to decrease inflammation 2) Blueberries- high in phytonutrients that decrease inflammation 3) Salmon- marine omega-3s reduce joint swelling and pain 4) Pumpkin seeds- reduce inflammation 5) dark chocolate- reduces inflammation 6) turmeric- reduces inflammation 7) tart cherries - reduce pain and stiffness 8) extra virgin olive oil - its compound olecanthal helps to block prostaglandins  9) chili peppers- can be eaten or applied topically via capsaicin 10) mint- helpful for headache, muscle aches, joint pain, and itching 11) garlic- reduces inflammation  Link to further information on diet for chronic pain: http://www.randall.com/   Turmeric to reduce inflammation--can be used in cooking or taken as a supplement.  Benefits of turmeric:  -Highly anti-inflammatory  -Increases antioxidants  -Improves memory, attention, brain disease  -Lowers risk of heart disease  -May help prevent cancer  -Decreases pain  -Alleviates depression  -Delays aging and decreases risk of chronic disease  -Consume with black pepper to increase absorption    Turmeric Milk Recipe:  1 cup milk  1 tsp turmeric  1 tsp cinnamon  1 tsp grated ginger (optional)  Black pepper (boosts the anti-inflammatory properties of turmeric).  1 tsp honey   2) Vitamin D deficiency -prescribed 50,000U ergocalciferol weekly for 20 weeks -discussed other natural sources of vitamin D such as mushrooms, salmon, and sun exposure  3) Obesity -Discussed the benefits of intermittent fasting. Recommended starting with pushing dinner 15 minutes earlier and when this feels easy, continuing to push dinner 15 minutes earlier. Discussed that this can help her body to improve its ability to burn fat rather than  glucose, improving insulin sensitivity. Recommended drinking Roobois tea in the evening to help curb appetite and for its numerous health benefits.    4) Food sensitivities -food sensitivity panel ordered and discussed with patient -she has sensitivities to chili pepper, cheese, rye, and oats.  -discussed trying elimination diet with these four 4 foods for 4 weeks and then gradually re-introducing foods one by one after this time  period if desired to assess for particular triggers -discussed trying shredded tofu in tacos rather than cheese  5) Fatigue -B12 monthly injections prescribed  13 minutes spent in discussion of her food allergy results, trying to avoid rye, oats, chili pepper, cheese, and chili pepper for 4 weeks and to assess if her GI symptoms/pain improve, discussed avoiding milk as well.
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
## 2022-06-26 ENCOUNTER — Ambulatory Visit: Payer: Medicaid Other | Attending: Physical Medicine and Rehabilitation

## 2022-06-26 DIAGNOSIS — M542 Cervicalgia: Secondary | ICD-10-CM | POA: Diagnosis present

## 2022-06-26 DIAGNOSIS — M25511 Pain in right shoulder: Secondary | ICD-10-CM | POA: Insufficient documentation

## 2022-06-26 DIAGNOSIS — M6281 Muscle weakness (generalized): Secondary | ICD-10-CM | POA: Diagnosis present

## 2022-06-26 DIAGNOSIS — R2681 Unsteadiness on feet: Secondary | ICD-10-CM | POA: Diagnosis present

## 2022-06-26 DIAGNOSIS — G8929 Other chronic pain: Secondary | ICD-10-CM | POA: Diagnosis present

## 2022-07-03 ENCOUNTER — Ambulatory Visit: Payer: Medicaid Other

## 2022-07-03 DIAGNOSIS — M542 Cervicalgia: Secondary | ICD-10-CM

## 2022-07-03 DIAGNOSIS — M6281 Muscle weakness (generalized): Secondary | ICD-10-CM

## 2022-07-03 DIAGNOSIS — G8929 Other chronic pain: Secondary | ICD-10-CM

## 2022-07-03 DIAGNOSIS — R2681 Unsteadiness on feet: Secondary | ICD-10-CM
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
## 2022-07-09 ENCOUNTER — Encounter: Payer: Self-pay | Admitting: Psychiatry

## 2022-07-09 ENCOUNTER — Telehealth: Payer: Self-pay | Admitting: Psychiatry

## 2022-07-09 ENCOUNTER — Ambulatory Visit (INDEPENDENT_AMBULATORY_CARE_PROVIDER_SITE_OTHER): Payer: Medicaid Other | Admitting: Psychiatry

## 2022-07-09 VITALS — Ht 65.0 in | Wt 203.0 lb

## 2022-07-09 DIAGNOSIS — R200 Anesthesia of skin: Secondary | ICD-10-CM | POA: Diagnosis not present

## 2022-07-09 DIAGNOSIS — R251 Tremor, unspecified: Secondary | ICD-10-CM | POA: Diagnosis not present

## 2022-07-09 NOTE — Progress Notes (Signed)
[facility]  PATIENT: Monique [name] DOB: 23-Jan-1986  REFERRING CLINICIAN: Ladell Pier, MD HISTORY FROM: self REASON FOR VISIT: right hand numbness, tremors   HISTORICAL  CHIEF COMPLAINT:  Chief Complaint  Patient presents with   Follow-up    RM 1 alone Pt is well, has had onset numbness of right hand for about 3 months. no onset triggers she is aware of     HISTORY OF PRESENT ILLNESS:  The patient presents for evaluation of right hand numbness and tremors which began 3 months ago. This typically occurs when she goes from sitting to standing. She will feel lightheaded and develop palpitations and blurred vision.  Whole arm will go numb. Her right arm will start shaking and sometimes she will feel like she will lose her balance. Sometimes her whole body will start to shake. She will often try to grab water, but will have grip strength weakness and drop the water. She has never lost consciousness.  She continues to have pain in her neck which radiates down her arm. MRI C-spine in October 2022 showed moderate spinal stenosis at C5-6. MRI brain was normal.  She does have a history of lightheadedness. Previously had episodes where she would get lightheaded when she leans forward. This was not associated with limb shaking or weakness.  OTHER MEDICAL CONDITIONS: iron deficiency anemia, vitamin D deficiency, TBI   REVIEW OF SYSTEMS: Full 14 system review of systems performed and negative with exception of: right arm weakness, numbness, tremors  ALLERGIES: No Known Allergies  HOME MEDICATIONS: Outpatient Medications Prior to Visit  Medication Sig Dispense Refill   cyanocobalamin (VITAMIN B12) 1000 MCG/ML injection Inject 1 mL (1,000 mcg total) into the muscle every 30 (thirty) days. 1 mL 11   ferrous sulfate 325 (65 FE) MG tablet Take 1 tablet (325 mg total) by mouth daily with breakfast. 100 tablet 1   Needles & Syringes MISC 1 each by Does not apply  route every 30 (thirty) days. Please given 25 gauge 1 1/2 inch needle and syringe to be used to inject B12 monthly injections, thank you! 1 each 11   Vitamin D, Ergocalciferol, (DRISDOL) 1.25 MG (50000 UNIT) CAPS capsule Take 1 capsule (50,000 Units total) by mouth every 7 (seven) days. 20 capsule 0   No facility-administered medications prior to visit.    PAST MEDICAL HISTORY: Past Medical History:  Diagnosis Date   Anemia    Anxiety    Cervicalgia    Chronic migraine with aura    Chronic pain syndrome    Cognitive deficit as late effect of traumatic brain injury ([place])    Depression    H/O cervical fracture    Heart murmur    Menorrhagia    TBI (traumatic brain injury) ([place])     PAST SURGICAL HISTORY: Past Surgical History:  Procedure Laterality Date   APPENDECTOMY     BONE EXCISION Right 02/02/2021   Procedure: OSTEOPHYTE EXCISION;  Surgeon: Altamese [place], MD;  Location: [place];  Service: Orthopedics;  Laterality: Right;   Ceasarean section  2007, 2010,2014   EXTERNAL FIXATION LEG Bilateral 12/14/2012   Procedure: EXTERNAL FIXATION LEG;  Surgeon: [name], MD;  Location: [place];  Service: Orthopedics;  Laterality: Bilateral;   EXTERNAL FIXATION REMOVAL Bilateral 12/23/2012   Procedure: REMOVAL EXTERNAL FIXATION LEG;  Surgeon: [name], MD;  Location: [place];  Service: Orthopedics;  Laterality: Bilateral;   HARDWARE REMOVAL Right 02/02/2021   Procedure: HARDWARE REMOVAL;  Surgeon:  Altamese [place], MD;  Location: [place];  Service: Orthopedics;  Laterality: Right;   I & D EXTREMITY Left 12/14/2012   Procedure: IRRIGATION AND DEBRIDEMENT EXTREMITY;  Surgeon: [name], MD;  Location: [place];  Service: Orthopedics;  Laterality: Left;   ORIF ANKLE FRACTURE Left 12/14/2012   Procedure: OPEN REDUCTION INTERNAL FIXATION (ORIF) ANKLE FRACTURE;  Surgeon: [name], MD;  Location: [place];  Service: Orthopedics;  Laterality: Left;   ORIF TIBIA FRACTURE Bilateral 12/23/2012    Procedure: OPEN REDUCTION INTERNAL FIXATION (ORIF) BILATERAL DISTAL TIBIA FRACTURE;  Surgeon: [name], MD;  Location: [place];  Service: Orthopedics;  Laterality: Bilateral;    FAMILY HISTORY: Family History  Problem Relation Age of Onset   Diabetes Father    Cancer Paternal Grandmother        Ovarian    SOCIAL HISTORY: Social History   Socioeconomic History   Marital status: Single    Spouse name: Not on file   Number of children: 3   Years of education: Not on file   Highest education level: Some college, no degree  Occupational History   Occupation: Disability  Tobacco Use   Smoking status: Never    Passive exposure: Yes   Smokeless tobacco: Never  Vaping Use   Vaping Use: Never used  Substance and Sexual Activity   Alcohol use: Not Currently    Comment: occasional   Drug use: No   Sexual activity: Yes    Birth control/protection: None  Other Topics Concern   Not on file  Social History Narrative   Not on file   Social Determinants of Health   Financial Resource Strain: Not on file  Food Insecurity: Not on file  Transportation Needs: Not on file  Physical Activity: Not on file  Stress: Not on file  Social Connections: Not on file  Intimate Partner Violence: Not on file     PHYSICAL EXAM  GENERAL EXAM/CONSTITUTIONAL: Vitals:  Vitals:   07/09/22 0835  Weight: 203 lb (92.1 kg)  Height: '5\' 5"'$  (1.651 m)   Body mass index is 33.78 kg/m. Wt Readings from Last 3 Encounters:  07/09/22 203 lb (92.1 kg)  06/19/22 205 lb 9.6 oz (93.3 kg)  06/01/22 200 lb 3.2 oz (90.8 kg)   Orthostatic VS for the past 24 hrs:  BP- Lying Pulse- Lying BP- Standing at 0 minutes Pulse- Standing at 0 minutes  07/09/22 0905 107/67 78 106/69 104   NEUROLOGIC: MENTAL STATUS:  awake, alert, oriented to person, place and time recent and remote memory intact normal attention and concentration  CRANIAL NERVE:  2nd, 3rd, 4th, 6th - pupils equal and reactive to light,  visual fields full to confrontation, extraocular muscles intact, no nystagmus 5th - facial sensation symmetric 7th - facial strength symmetric 8th - hearing intact 9th - palate elevates symmetrically, uvula midline 11th - shoulder shrug symmetric 12th - tongue protrusion midline  MOTOR:  normal bulk and tone, full strength in the BUE, BLE  SENSORY:  normal and symmetric to light touch all 4 extremities  COORDINATION:  finger-nose-finger intact bilaterally  REFLEXES:  deep tendon reflexes present and symmetric  GAIT/STATION:  normal     DIAGNOSTIC DATA (LABS, IMAGING, TESTING) - I reviewed patient records, labs, notes, testing and imaging myself where available.  Lab Results  Component Value Date   WBC 6.7 06/01/2022   HGB 9.4 (L) 06/01/2022   HCT 32.2 (L) 06/01/2022   MCV 71 (L) 06/01/2022   PLT  404 06/01/2022      Component Value Date/Time   NA 137 12/05/2021 1831   NA 137 06/02/2020 1031   K 3.7 12/05/2021 1831   CL 102 12/05/2021 1831   CO2 25 12/05/2021 1831   GLUCOSE 114 (H) 12/05/2021 1831   BUN 10 12/05/2021 1831   BUN 12 06/02/2020 1031   CREATININE 0.86 12/05/2021 1831   CALCIUM 9.0 12/05/2021 1831   PROT 8.2 (H) 10/12/2020 0420   PROT 6.8 06/02/2020 1031   ALBUMIN 4.3 10/12/2020 0420   ALBUMIN 4.2 06/02/2020 1031   AST 19 10/12/2020 0420   ALT 12 10/12/2020 0420   ALKPHOS 55 10/12/2020 0420   BILITOT 0.5 10/12/2020 0420   BILITOT 0.2 06/02/2020 1031   GFRNONAA >60 12/05/2021 1831   GFRAA 118 06/02/2020 1031   Lab Results  Component Value Date   CHOL 166 04/28/2019   HDL 58.40 04/28/2019   LDLCALC 96 04/28/2019   TRIG 58.0 04/28/2019   CHOLHDL 3 04/28/2019   Lab Results  Component Value Date   HGBA1C 5.9 (H) 12/14/2012   Lab Results  Component Value Date   [id number] 267 06/19/2022   Lab Results  Component Value Date   TSH 1.04 04/28/2019    ASSESSMENT AND PLAN  36 y.o. year old female with a history of iron deficiency  anemia, vitamin D deficiency, TBI who presents for evaluation of orthostatic right arm numbness and shaking. MRI C-spine with moderate C5-6 stenosis. Will check EMG for peripheral causes of right arm numbness. However there appears to be a strong orthostatic component to her symptoms. While she is not hypotensive on orthostatic vitals today, her HR does significantly increase from sitting to standing. If EMG is normal would consider vessel imaging of the head and neck. It is also possible for cervical stenosis to cause symptoms of dysautonomia. If neurologic workup is negative she may benefit from cardiology consult as she does report palpitations with these symptoms.   1. Right arm numbness       PLAN: -EMG -Next steps: consider CTA head/neck if EMG negative  Orders Placed This Encounter  Procedures   NCV with EMG(electromyography)     Return in about 4 months (around 11/09/2022).    Genia Harold, MD 07/09/22 10:25 AM  I spent an average of 41 minutes chart reviewing and counseling the patient, with at least 50% of the time face to face with the patient.   [facility] [address] [place], [place] [address] [phone]

## 2022-07-09 NOTE — Therapy (Signed)
OUTPATIENT PHYSICAL THERAPY TREATMENT NOTE   Patient Name: Monique [name] MRN: [id number] DOB:1986/01/18, 36 y.o., female Today's Date: 07/10/2022  PCP: Ladell Pier, MD REFERRING PROVIDER: Izora Ribas, MD  END OF SESSION:   PT End of Session - 07/10/22 0832     Visit Number 3    Number of Visits 17    Date for PT Re-Evaluation 08/21/22    Authorization Type [employer]    PT Start Time 0832    PT Stop Time 0910    PT Time Calculation (min) 38 min    Activity Tolerance Patient tolerated treatment well    Behavior During Therapy WFL for tasks assessed/performed              Past Medical History:  Diagnosis Date   Anemia    Anxiety    Cervicalgia    Chronic migraine with aura    Chronic pain syndrome    Cognitive deficit as late effect of traumatic brain injury ([place])    Depression    H/O cervical fracture    Heart murmur    Menorrhagia    TBI (traumatic brain injury) [facility])    Past Surgical History:  Procedure Laterality Date   APPENDECTOMY     BONE EXCISION Right 02/02/2021   Procedure: OSTEOPHYTE EXCISION;  Surgeon: Altamese [place], MD;  Location: [place];  Service: Orthopedics;  Laterality: Right;   Ceasarean section  2007, 2010,2014   EXTERNAL FIXATION LEG Bilateral 12/14/2012   Procedure: EXTERNAL FIXATION LEG;  Surgeon: [name], MD;  Location: [place];  Service: Orthopedics;  Laterality: Bilateral;   EXTERNAL FIXATION REMOVAL Bilateral 12/23/2012   Procedure: REMOVAL EXTERNAL FIXATION LEG;  Surgeon: [name], MD;  Location: [place];  Service: Orthopedics;  Laterality: Bilateral;   HARDWARE REMOVAL Right 02/02/2021   Procedure: HARDWARE REMOVAL;  Surgeon: Altamese [place], MD;  Location: [place];  Service: Orthopedics;  Laterality: Right;   I & D EXTREMITY Left 12/14/2012   Procedure: IRRIGATION AND DEBRIDEMENT EXTREMITY;  Surgeon: [name], MD;  Location: [place];  Service: Orthopedics;  Laterality: Left;   ORIF ANKLE FRACTURE Left  12/14/2012   Procedure: OPEN REDUCTION INTERNAL FIXATION (ORIF) ANKLE FRACTURE;  Surgeon: [name], MD;  Location: [place];  Service: Orthopedics;  Laterality: Left;   ORIF TIBIA FRACTURE Bilateral 12/23/2012   Procedure: OPEN REDUCTION INTERNAL FIXATION (ORIF) BILATERAL DISTAL TIBIA FRACTURE;  Surgeon: [name], MD;  Location: [place];  Service: Orthopedics;  Laterality: Bilateral;   Patient Active Problem List   Diagnosis Date Noted   Fibroids 10/19/2020   History of uterine fibroid 08/12/2020   Menorrhagia with irregular cycle 08/12/2020   Gastroesophageal reflux disease without esophagitis 06/02/2020   Keloid of skin 06/02/2020   Vitamin D deficiency 04/29/2019   Iron deficiency anemia    Iliotibial band syndrome affecting right lower leg 11/05/2018   Therapeutic opioid induced constipation 11/05/2018   Chronic pain syndrome 05/12/2018   Cognitive deficit as late effect of traumatic brain injury ([place]) 08/31/2014   TBI (traumatic brain injury) ([place]) 09/16/2013   Tibial nerve lesion 03/31/2013   Neck mass 02/06/2013   New onset of headaches due to trauma 01/02/2013   Migraine 12/21/2012   Concussion 12/18/2012   Closed C2 fracture ([place]) 12/15/2012   Fracture, sternum closed 12/15/2012   Bilateral ankle fractures 12/15/2012   Pulmonary contusion 12/15/2012    REFERRING DIAG: M54.2 (ICD-10-CM) - Cervicalgia  THERAPY DIAG:  Cervicalgia  Muscle  weakness (generalized)  Rationale for Evaluation and Treatment Rehabilitation  PERTINENT HISTORY: Past C2 fx, TBI, ankle fx's  PRECAUTIONS: None  SUBJECTIVE: Pt presents to PT with no current neck pain. Has been fairly compliant with HEP. Pt is ready to begin PT at this time.   PAIN:  Are you having pain?  Yes: NPRS scale: 6/10 (8/10 at worst) Pain location: neck, upper traps Pain description: stiffness Aggravating factors: sitting at desk Relieving factors: none   OBJECTIVE: (objective measures completed at initial  evaluation unless otherwise dated)  DIAGNOSTIC FINDINGS:  See imaging   PATIENT SURVEYS:  NDI: 64% disability   COGNITION: Overall cognitive status: Within functional limits for tasks assessed   SENSATION: WFL   POSTURE: rounded shoulders and forward head   PALPATION: TTP to bilateral upper traps and cervical paraspinals       CERVICAL ROM:    Active ROM A/PROM (deg) eval  Flexion    Extension    Right lateral flexion    Left lateral flexion    Right rotation 35  Left rotation 30   (Blank rows = not tested)   UE Myotomes:   Myotome Right Left  C5 5/5 5/5  C6 5/5 5/5  C7 5/5 5/5  C8 5/5 5/5  T1 5/5 5/5    UPPER EXTREMITY ROM:   Active ROM Right eval Left eval  Shoulder flexion [facility]  Shoulder extension      Shoulder abduction [facility]  Shoulder adduction      Shoulder extension      Shoulder internal rotation      Shoulder external rotation      Elbow flexion      Elbow extension       (Blank rows = not tested)   UPPER EXTREMITY MMT:   MMT Right eval Left eval  Shoulder flexion [facility]  Shoulder extension      Shoulder abduction [facility]  Shoulder adduction      Shoulder extension      Shoulder internal rotation      Shoulder external rotation      Middle trapezius 3+/5 3+/5  Lower trapezius 3+/5 3+/5  Elbow flexion      Elbow extension      Wrist flexion      Wrist extension      Wrist ulnar deviation      Wrist radial deviation      Wrist pronation      Wrist supination      Grip strength       (Blank rows = not tested)   FUNCTIONAL TESTS:  Cervical flexor endurance test: 24 sec     TODAY'S TREATMENT:  [facility] Adult PT Treatment:                                                DATE: 07/10/2022 Therapeutic Exercise: UBE lvl 1.5 x 4 min while taking subjective  Row 3x10 23# Bilateral ER 2x10 GTB Supine horizontal abd 3x10 GTB Supin chin tuck x 10 - 5" hold Manual Therapy: Suboccipital release Positional release upper traps STM  to cervical paraspinals Modalities:  MHP to cervical paraspinals x 10 min post session  [facility] Adult PT Treatment:  DATE: 07/03/2022 Therapeutic Exercise: Row 2x10 GTB Bilateral ER 2x10 GTB Supine horizontal abd 2x10 GTB Supin chin tuck x 10 - 5" hold Manual Therapy: Suboccipital release Positional release upper traps STM to cervical paraspinals  [facility] Adult PT Treatment:                                                DATE: 06/26/2022 Therapeutic Exercise: Median nerve glide x 5 R Row x 15 GTB Supin chin tuck x 10 - 5" hold Manual Therapy: Suboccipital release     PATIENT EDUCATION:  Education details: continue HEP Person educated: Patient Education method: Explanation, Demonstration, and Handouts Education comprehension: verbalized understanding and returned demonstration     HOME EXERCISE PROGRAM: Access Code: [email or web address] URL: [email or web address] Date: 06/26/2022 Prepared by: Octavio Manns   Exercises - Median Nerve Tensioner  - 1 x daily - 7 x weekly - 2 sets - 10 reps - Standing Shoulder Row with Anchored Resistance  - 1 x daily - 7 x weekly - 3 sets - 10 reps - green  hold - Supine Chin Tuck  - 1 x daily - 7 x weekly - 2 sets - 10 reps - 3 sec hold   ASSESSMENT:   CLINICAL IMPRESSION: Pt was able to complete all prescribed exercises with no adverse effect or increase in pain. Therapy focused on continued periscapular and DNF strengthening. Responded well to manual therapy interventions. Will continue to progress as able, perform NDI at next session.      OBJECTIVE IMPAIRMENTS decreased activity tolerance, decreased mobility, decreased ROM, decreased strength, postural dysfunction, and pain.    ACTIVITY LIMITATIONS carrying, lifting, reach over head, and caring for others   PARTICIPATION LIMITATIONS: driving, community activity, and occupation   PERSONAL FACTORS Time since onset of  injury/illness/exacerbation and 1-2 comorbidities: Past C2 fx, TBI, ankle fx's  are also affecting patient's functional outcome.      GOALS: Goals reviewed with patient? No   SHORT TERM GOALS: Target date: 07/17/2022    Pt will be compliant and knowledgeable with initial HEP for improved comfort and carryover Baseline: initial HEP given  Goal status: INITIAL   2.  Pt will self report neck pain no greater than 5/10 for improved comfort and functional ability Baseline: 8/10 at worst Goal status: INITIAL    LONG TERM GOALS: Target date: 08/21/2022   Pt will improve bilateral cervical rotation to at least 60 deg for improved functional ability with driving Baseline: see chart Goal status: INITIAL   2.  Pt will decrease NDI disability score to no greater than 40% for improved subjective functional ability with work and community activities Baseline: 64% disability Goal status: INITIAL   3.  Pt will improve cervical flexor endurance test hold time to no less than 45 sec for improved postural endurance and decrease neck pain Baseline: 24 sec Goal status: INITIAL   4.  Pt will self report neck pain no greater than 1-2/10 for improved comfort and functional ability Baseline: 8/10 at worst Goal status: INITIAL     PLAN: PT FREQUENCY: 1x/week   PT DURATION: 3 weeks   PLANNED INTERVENTIONS: Therapeutic exercises, Therapeutic activity, Neuromuscular re-education, Balance training, Gait training, Patient/Family education, Self Care, Joint mobilization, Dry Needling, Electrical stimulation, Cryotherapy, Moist heat, Manual therapy, and Re-evaluation   PLAN FOR NEXT SESSION: assess HEP response, progress DNF and  periscapular strength, manual and TPDN to neck and upper traps    Ward Chatters, PT 07/10/2022, 9:11 AM

## 2022-07-09 NOTE — Telephone Encounter (Signed)
Fax sent to [facility] for scheduling of NCV/EMG

## 2022-07-10 ENCOUNTER — Ambulatory Visit: Payer: Medicaid Other | Attending: Physical Medicine and Rehabilitation

## 2022-07-10 DIAGNOSIS — M25511 Pain in right shoulder: Secondary | ICD-10-CM | POA: Diagnosis present

## 2022-07-10 DIAGNOSIS — G8929 Other chronic pain: Secondary | ICD-10-CM | POA: Diagnosis present

## 2022-07-10 DIAGNOSIS — R2681 Unsteadiness on feet: Secondary | ICD-10-CM | POA: Diagnosis present

## 2022-07-10 DIAGNOSIS — M542 Cervicalgia: Secondary | ICD-10-CM | POA: Diagnosis present

## 2022-07-10 DIAGNOSIS — M6281 Muscle weakness (generalized): Secondary | ICD-10-CM | POA: Diagnosis present

## 2022-07-17 ENCOUNTER — Ambulatory Visit: Payer: Medicaid Other

## 2022-07-19 ENCOUNTER — Ambulatory Visit: Payer: Medicaid Other

## 2022-07-19 DIAGNOSIS — M542 Cervicalgia: Secondary | ICD-10-CM | POA: Diagnosis not present

## 2022-07-19 DIAGNOSIS — M6281 Muscle weakness (generalized): Secondary | ICD-10-CM

## 2022-07-19 DIAGNOSIS — R2681 Unsteadiness on feet: Secondary | ICD-10-CM

## 2022-07-19 DIAGNOSIS — G8929 Other chronic pain: Secondary | ICD-10-CM

## 2022-07-19 NOTE — Therapy (Signed)
OUTPATIENT PHYSICAL THERAPY TREATMENT NOTE   Patient Name: Monique [name] MRN: [id number] DOB:1986/09/08, 36 y.o., female Today's Date: 07/19/2022  PCP: Ladell Pier, MD REFERRING PROVIDER: Izora Ribas, MD  END OF SESSION:   PT End of Session - 07/19/22 1809     Visit Number 4    Number of Visits 17    Date for PT Re-Evaluation 08/21/22    Authorization Type [employer]    PT Start Time 1497    PT Stop Time 1850    PT Time Calculation (min) 40 min    Activity Tolerance Patient tolerated treatment well    Behavior During Therapy WFL for tasks assessed/performed              Past Medical History:  Diagnosis Date   Anemia    Anxiety    Cervicalgia    Chronic migraine with aura    Chronic pain syndrome    Cognitive deficit as late effect of traumatic brain injury ([place])    Depression    H/O cervical fracture    Heart murmur    Menorrhagia    TBI (traumatic brain injury) [facility])    Past Surgical History:  Procedure Laterality Date   APPENDECTOMY     BONE EXCISION Right 02/02/2021   Procedure: OSTEOPHYTE EXCISION;  Surgeon: Altamese Kidder, MD;  Location: [place];  Service: Orthopedics;  Laterality: Right;   Ceasarean section  2007, 2010,2014   EXTERNAL FIXATION LEG Bilateral 12/14/2012   Procedure: EXTERNAL FIXATION LEG;  Surgeon: [name], MD;  Location: [place];  Service: Orthopedics;  Laterality: Bilateral;   EXTERNAL FIXATION REMOVAL Bilateral 12/23/2012   Procedure: REMOVAL EXTERNAL FIXATION LEG;  Surgeon: [name], MD;  Location: [place];  Service: Orthopedics;  Laterality: Bilateral;   HARDWARE REMOVAL Right 02/02/2021   Procedure: HARDWARE REMOVAL;  Surgeon: Altamese [place], MD;  Location: [place];  Service: Orthopedics;  Laterality: Right;   I & D EXTREMITY Left 12/14/2012   Procedure: IRRIGATION AND DEBRIDEMENT EXTREMITY;  Surgeon: [name], MD;  Location: [place];  Service: Orthopedics;  Laterality: Left;   ORIF ANKLE FRACTURE Left  12/14/2012   Procedure: OPEN REDUCTION INTERNAL FIXATION (ORIF) ANKLE FRACTURE;  Surgeon: [name], MD;  Location: [place];  Service: Orthopedics;  Laterality: Left;   ORIF TIBIA FRACTURE Bilateral 12/23/2012   Procedure: OPEN REDUCTION INTERNAL FIXATION (ORIF) BILATERAL DISTAL TIBIA FRACTURE;  Surgeon: [name], MD;  Location: [place];  Service: Orthopedics;  Laterality: Bilateral;   Patient Active Problem List   Diagnosis Date Noted   Fibroids 10/19/2020   History of uterine fibroid 08/12/2020   Menorrhagia with irregular cycle 08/12/2020   Gastroesophageal reflux disease without esophagitis 06/02/2020   Keloid of skin 06/02/2020   Vitamin D deficiency 04/29/2019   Iron deficiency anemia    Iliotibial band syndrome affecting right lower leg 11/05/2018   Therapeutic opioid induced constipation 11/05/2018   Chronic pain syndrome 05/12/2018   Cognitive deficit as late effect of traumatic brain injury ([place]) 08/31/2014   TBI (traumatic brain injury) ([place]) 09/16/2013   Tibial nerve lesion 03/31/2013   Neck mass 02/06/2013   New onset of headaches due to trauma 01/02/2013   Migraine 12/21/2012   Concussion 12/18/2012   Closed C2 fracture ([place]) 12/15/2012   Fracture, sternum closed 12/15/2012   Bilateral ankle fractures 12/15/2012   Pulmonary contusion 12/15/2012    REFERRING DIAG: M54.2 (ICD-10-CM) - Cervicalgia  THERAPY DIAG:  Cervicalgia  Muscle  weakness (generalized)  Chronic right shoulder pain  Unsteadiness on feet  Rationale for Evaluation and Treatment Rehabilitation  PERTINENT HISTORY: Past C2 fx, TBI, ankle fx's  PRECAUTIONS: None  SUBJECTIVE: Pt presents to PT with no current reports of neck pain. Has been compliant with HEP with no adverse effect. Pt is ready to begin PT at this time.   PAIN:  Are you having pain?  No: NPRS scale: 0/10 (8/10 at worst) Pain location: neck, upper traps Pain description: stiffness Aggravating factors: sitting at  desk Relieving factors: none   OBJECTIVE: (objective measures completed at initial evaluation unless otherwise dated)  PATIENT SURVEYS:  NDI: 64% disability - eval NDI: 58% disability - 07/19/2022   COGNITION: Overall cognitive status: Within functional limits for tasks assessed   SENSATION: WFL   POSTURE: rounded shoulders and forward head   PALPATION: TTP to bilateral upper traps and cervical paraspinals       CERVICAL ROM:    Active ROM A/PROM (deg) eval A/PROM (deg) 07/19/2022  Flexion     Extension     Right lateral flexion     Left lateral flexion     Right rotation 35 38  Left rotation 30 30   (Blank rows = not tested)   UE Myotomes:   Myotome Right Left  C5 5/5 5/5  C6 5/5 5/5  C7 5/5 5/5  C8 5/5 5/5  T1 5/5 5/5    UPPER EXTREMITY ROM:   Active ROM Right eval Left eval  Shoulder flexion [facility]  Shoulder extension      Shoulder abduction [facility]  Shoulder adduction      Shoulder extension      Shoulder internal rotation      Shoulder external rotation      Elbow flexion      Elbow extension       (Blank rows = not tested)   UPPER EXTREMITY MMT:   MMT Right eval Left eval  Shoulder flexion [facility]  Shoulder extension      Shoulder abduction [facility]  Shoulder adduction      Shoulder extension      Shoulder internal rotation      Shoulder external rotation      Middle trapezius 3+/5 3+/5  Lower trapezius 3+/5 3+/5  Elbow flexion      Elbow extension      Wrist flexion      Wrist extension      Wrist ulnar deviation      Wrist radial deviation      Wrist pronation      Wrist supination      Grip strength       (Blank rows = not tested)   FUNCTIONAL TESTS:  Cervical flexor endurance test: 25 sec     TODAY'S TREATMENT:  [facility] Adult PT Treatment:                                                DATE: 07/19/2022 Therapeutic Exercise: UBE lvl 1.5 x 3 min while taking subjective  Seated row 2x10 25# Serratus foam roll YTB x  10 Row 3x10 23# Bilateral ER 2x10 GTB Supine horizontal abd 3x10 GTB Supine chin tuck 2x10 - 5" hold Supine serratus punch x 10 3# each Manual Therapy: Suboccipital release Positional release upper traps STM to cervical paraspinals Modalities:  MHP to cervical paraspinals x 10 min post session  [facility] Adult PT Treatment:                                                DATE: 07/10/2022 Therapeutic Exercise: UBE lvl 1.5 x 4 min while taking subjective  Row 3x10 23# Bilateral ER 2x10 GTB Supine horizontal abd 3x10 GTB Supin chin tuck x 10 - 5" hold Manual Therapy: Suboccipital release Positional release upper traps STM to cervical paraspinals  [facility] Adult PT Treatment:                                                DATE: 07/03/2022 Therapeutic Exercise: Row 2x10 GTB Bilateral ER 2x10 GTB Supine horizontal abd 2x10 GTB Supin chin tuck x 10 - 5" hold Manual Therapy: Suboccipital release Positional release upper traps STM to cervical paraspinals  [facility] Adult PT Treatment:                                                DATE: 06/26/2022 Therapeutic Exercise: Median nerve glide x 5 R Row x 15 GTB Supin chin tuck x 10 - 5" hold Manual Therapy: Suboccipital release     PATIENT EDUCATION:  Education details: continue HEP Person educated: Patient Education method: Explanation, Demonstration, and Handouts Education comprehension: verbalized understanding and returned demonstration     HOME EXERCISE PROGRAM: Access Code: [email or web address] URL: [email or web address] Date: 06/26/2022 Prepared by: Octavio Manns   Exercises - Median Nerve Tensioner  - 1 x daily - 7 x weekly - 2 sets - 10 reps - Standing Shoulder Row with Anchored Resistance  - 1 x daily - 7 x weekly - 3 sets - 10 reps - green  hold - Supine Chin Tuck  - 1 x daily - 7 x weekly - 2 sets - 10 reps - 3 sec hold   ASSESSMENT:   CLINICAL IMPRESSION: Pt was able to complete all prescribed exercises with no adverse  effect or increase in pain. Is is progressing well with therapy, showing improving ROM and decrease in NDI disability score. She does continue to require skilled PT services for improving periscapular and DNF strength in order to decrease neck pain. Will continue to progress as able per POC as prescribed.      OBJECTIVE IMPAIRMENTS decreased activity tolerance, decreased mobility, decreased ROM, decreased strength, postural dysfunction, and pain.    ACTIVITY LIMITATIONS carrying, lifting, reach over head, and caring for others   PARTICIPATION LIMITATIONS: driving, community activity, and occupation   PERSONAL FACTORS Time since onset of injury/illness/exacerbation and 1-2 comorbidities: Past C2 fx, TBI, ankle fx's  are also affecting patient's functional outcome.      GOALS: Goals reviewed with patient? No   SHORT TERM GOALS: Target date: 07/17/2022    Pt will be compliant and knowledgeable with initial HEP for improved comfort and carryover Baseline: initial HEP given  Goal status: MET   2.  Pt will self report neck pain no greater than 5/10 for improved comfort and functional ability Baseline: 8/10 at worst 07/19/2022: 6/10 at worst Goal status:  ONGOING   LONG TERM GOALS: Target date: 08/21/2022   Pt will improve bilateral cervical rotation to at least 60 deg for improved functional ability with driving Baseline: see chart 07/19/2022: see chart Goal status: ONGOING   2.  Pt will decrease NDI disability score to no greater than 40% for improved subjective functional ability with work and community activities Baseline: 64% disability Goal status: INITIAL   3.  Pt will improve cervical flexor endurance test hold time to no less than 45 sec for improved postural endurance and decrease neck pain Baseline: 24 sec 07/19/2022: 25 sec Goal status: ONGOING   4.  Pt will self report neck pain no greater than 1-2/10 for improved comfort and functional ability Baseline: 8/10 at  worst 07/19/2022: 6/10 at worst Goal status: ONGOING     PLAN: PT FREQUENCY: 2x/week   PT DURATION: 6 weeks   PLANNED INTERVENTIONS: Therapeutic exercises, Therapeutic activity, Neuromuscular re-education, Balance training, Gait training, Patient/Family education, Self Care, Joint mobilization, Dry Needling, Electrical stimulation, Cryotherapy, Moist heat, Manual therapy, and Re-evaluation   PLAN FOR NEXT SESSION: assess HEP response, progress DNF and periscapular strength, manual and TPDN to neck and upper traps    Ward Chatters, PT 07/19/2022, 6:57 PM

## 2022-07-31 ENCOUNTER — Ambulatory Visit: Payer: Medicaid Other

## 2022-07-31 DIAGNOSIS — M6281 Muscle weakness (generalized): Secondary | ICD-10-CM

## 2022-07-31 DIAGNOSIS — M542 Cervicalgia: Secondary | ICD-10-CM

## 2022-07-31 NOTE — Therapy (Signed)
OUTPATIENT PHYSICAL THERAPY TREATMENT NOTE   Patient Name: Monique [name] MRN: [id number] DOB:10/22/1985, 36 y.o., female Today's Date: 07/31/2022  PCP: Ladell Pier, MD REFERRING PROVIDER: Izora Ribas, MD  END OF SESSION:   PT End of Session - 07/31/22 1209     Visit Number 5    Number of Visits 17    Date for PT Re-Evaluation 08/21/22    Authorization Type [employer]    PT Start Time 1215    PT Stop Time 1255    PT Time Calculation (min) 40 min    Activity Tolerance Patient tolerated treatment well    Behavior During Therapy WFL for tasks assessed/performed               Past Medical History:  Diagnosis Date   Anemia    Anxiety    Cervicalgia    Chronic migraine with aura    Chronic pain syndrome    Cognitive deficit as late effect of traumatic brain injury ([place])    Depression    H/O cervical fracture    Heart murmur    Menorrhagia    TBI (traumatic brain injury) [facility])    Past Surgical History:  Procedure Laterality Date   APPENDECTOMY     BONE EXCISION Right 02/02/2021   Procedure: OSTEOPHYTE EXCISION;  Surgeon: Altamese [employer], MD;  Location: [place];  Service: Orthopedics;  Laterality: Right;   Ceasarean section  2007, 2010,2014   EXTERNAL FIXATION LEG Bilateral 12/14/2012   Procedure: EXTERNAL FIXATION LEG;  Surgeon: [name], MD;  Location: [place];  Service: Orthopedics;  Laterality: Bilateral;   EXTERNAL FIXATION REMOVAL Bilateral 12/23/2012   Procedure: REMOVAL EXTERNAL FIXATION LEG;  Surgeon: [name], MD;  Location: [place];  Service: Orthopedics;  Laterality: Bilateral;   HARDWARE REMOVAL Right 02/02/2021   Procedure: HARDWARE REMOVAL;  Surgeon: Altamese [place], MD;  Location: [place];  Service: Orthopedics;  Laterality: Right;   I & D EXTREMITY Left 12/14/2012   Procedure: IRRIGATION AND DEBRIDEMENT EXTREMITY;  Surgeon: [name], MD;  Location: [place];  Service: Orthopedics;  Laterality: Left;   ORIF ANKLE FRACTURE Left  12/14/2012   Procedure: OPEN REDUCTION INTERNAL FIXATION (ORIF) ANKLE FRACTURE;  Surgeon: [name], MD;  Location: [place];  Service: Orthopedics;  Laterality: Left;   ORIF TIBIA FRACTURE Bilateral 12/23/2012   Procedure: OPEN REDUCTION INTERNAL FIXATION (ORIF) BILATERAL DISTAL TIBIA FRACTURE;  Surgeon: [name], MD;  Location: [place];  Service: Orthopedics;  Laterality: Bilateral;   Patient Active Problem List   Diagnosis Date Noted   Fibroids 10/19/2020   History of uterine fibroid 08/12/2020   Menorrhagia with irregular cycle 08/12/2020   Gastroesophageal reflux disease without esophagitis 06/02/2020   Keloid of skin 06/02/2020   Vitamin D deficiency 04/29/2019   Iron deficiency anemia    Iliotibial band syndrome affecting right lower leg 11/05/2018   Therapeutic opioid induced constipation 11/05/2018   Chronic pain syndrome 05/12/2018   Cognitive deficit as late effect of traumatic brain injury ([place]) 08/31/2014   TBI (traumatic brain injury) ([place]) 09/16/2013   Tibial nerve lesion 03/31/2013   Neck mass 02/06/2013   New onset of headaches due to trauma 01/02/2013   Migraine 12/21/2012   Concussion 12/18/2012   Closed C2 fracture ([place]) 12/15/2012   Fracture, sternum closed 12/15/2012   Bilateral ankle fractures 12/15/2012   Pulmonary contusion 12/15/2012    REFERRING DIAG: M54.2 (ICD-10-CM) - Cervicalgia  THERAPY DIAG:  Cervicalgia  Muscle weakness (generalized)  Rationale for Evaluation and Treatment Rehabilitation  PERTINENT HISTORY: Past C2 fx, TBI, ankle fx's  PRECAUTIONS: None  SUBJECTIVE: Pt presents to PT with reports of increased neck pain compared to last session. Has been compliant with HEP with no adverse effect. Pt is ready to begin PT at this time.   PAIN:  Are you having pain?  Yes: NPRS scale: 4/10 (8/10 at worst) Pain location: neck, upper traps Pain description: stiffness Aggravating factors: sitting at desk Relieving factors:  none   OBJECTIVE: (objective measures completed at initial evaluation unless otherwise dated)  PATIENT SURVEYS:  NDI: 64% disability - eval NDI: 58% disability - 07/19/2022   COGNITION: Overall cognitive status: Within functional limits for tasks assessed   SENSATION: WFL   POSTURE: rounded shoulders and forward head   PALPATION: TTP to bilateral upper traps and cervical paraspinals       CERVICAL ROM:    Active ROM A/PROM (deg) eval A/PROM (deg) 07/19/2022  Flexion     Extension     Right lateral flexion     Left lateral flexion     Right rotation 35 38  Left rotation 30 30   (Blank rows = not tested)   UE Myotomes:   Myotome Right Left  C5 5/5 5/5  C6 5/5 5/5  C7 5/5 5/5  C8 5/5 5/5  T1 5/5 5/5    UPPER EXTREMITY ROM:   Active ROM Right eval Left eval  Shoulder flexion [facility]  Shoulder extension      Shoulder abduction [facility]  Shoulder adduction      Shoulder extension      Shoulder internal rotation      Shoulder external rotation      Elbow flexion      Elbow extension       (Blank rows = not tested)   UPPER EXTREMITY MMT:   MMT Right eval Left eval  Shoulder flexion [facility]  Shoulder extension      Shoulder abduction [facility]  Shoulder adduction      Shoulder extension      Shoulder internal rotation      Shoulder external rotation      Middle trapezius 3+/5 3+/5  Lower trapezius 3+/5 3+/5  Elbow flexion      Elbow extension      Wrist flexion      Wrist extension      Wrist ulnar deviation      Wrist radial deviation      Wrist pronation      Wrist supination      Grip strength       (Blank rows = not tested)   FUNCTIONAL TESTS:  Cervical flexor endurance test: 25 sec     TODAY'S TREATMENT:  [facility] Adult PT Treatment:                                                DATE: 07/31/2022 Therapeutic Exercise: UBE lvl 1.0 x 4 min while taking subjective  Seated row 2x10 25# Seated bilateral ER 2x15 GTB Seated horizontal abd  2x15 GTB Supine chin tuck 2x10 - 5" hold S/L open book x 10 each Supine serratus punch 2x10 3# BIL Standing chin tuck against wall x 10 - 5" hold Manual Therapy: Suboccipital release Positional release upper traps STM to cervical  paraspinals Modalities:  MHP to cervical paraspinals x 10 min post session  [facility] Adult PT Treatment:                                                DATE: 07/19/2022 Therapeutic Exercise: UBE lvl 1.5 x 3 min while taking subjective  Seated row 2x10 25# Serratus foam roll YTB x 10 Row 3x10 23# Bilateral ER 2x10 GTB Supine horizontal abd 3x10 GTB Supine chin tuck 2x10 - 5" hold Supine serratus punch x 10 3# each Manual Therapy: Suboccipital release Positional release upper traps STM to cervical paraspinals Modalities:  MHP to cervical paraspinals x 10 min post session  [facility] Adult PT Treatment:                                                DATE: 07/10/2022 Therapeutic Exercise: UBE lvl 1.5 x 4 min while taking subjective  Row 3x10 23# Bilateral ER 2x10 GTB Supine horizontal abd 3x10 GTB Supin chin tuck x 10 - 5" hold Manual Therapy: Suboccipital release Positional release upper traps STM to cervical paraspinals  [facility] Adult PT Treatment:                                                DATE: 07/03/2022 Therapeutic Exercise: Row 2x10 GTB Bilateral ER 2x10 GTB Supine horizontal abd 2x10 GTB Supin chin tuck x 10 - 5" hold Manual Therapy: Suboccipital release Positional release upper traps STM to cervical paraspinals  [facility] Adult PT Treatment:                                                DATE: 06/26/2022 Therapeutic Exercise: Median nerve glide x 5 R Row x 15 GTB Supin chin tuck x 10 - 5" hold Manual Therapy: Suboccipital release     PATIENT EDUCATION:  Education details: continue HEP Person educated: Patient Education method: Explanation, Demonstration, and Handouts Education comprehension: verbalized understanding and returned  demonstration     HOME EXERCISE PROGRAM: Access Code: [email or web address] URL: [email or web address] Date: 06/26/2022 Prepared by: Octavio Manns   Exercises - Median Nerve Tensioner  - 1 x daily - 7 x weekly - 2 sets - 10 reps - Standing Shoulder Row with Anchored Resistance  - 1 x daily - 7 x weekly - 3 sets - 10 reps - green  hold - Supine Chin Tuck  - 1 x daily - 7 x weekly - 2 sets - 10 reps - 3 sec hold   ASSESSMENT:   CLINICAL IMPRESSION: Pt was able to complete all prescribed exercises with no adverse effect or increase in pain. Therapy focused on continued periscapular and DNF strengthening. Responded well to manual therapy interventions. Will continue to progress as able, perform NDI at next session.      OBJECTIVE IMPAIRMENTS decreased activity tolerance, decreased mobility, decreased ROM, decreased strength, postural dysfunction, and pain.    ACTIVITY LIMITATIONS carrying, lifting, reach over head, and caring for others  PARTICIPATION LIMITATIONS: driving, community activity, and occupation   PERSONAL FACTORS Time since onset of injury/illness/exacerbation and 1-2 comorbidities: Past C2 fx, TBI, ankle fx's  are also affecting patient's functional outcome.      GOALS: Goals reviewed with patient? No   SHORT TERM GOALS: Target date: 07/17/2022    Pt will be compliant and knowledgeable with initial HEP for improved comfort and carryover Baseline: initial HEP given  Goal status: MET   2.  Pt will self report neck pain no greater than 5/10 for improved comfort and functional ability Baseline: 8/10 at worst 07/19/2022: 6/10 at worst Goal status: ONGOING   LONG TERM GOALS: Target date: 08/21/2022   Pt will improve bilateral cervical rotation to at least 60 deg for improved functional ability with driving Baseline: see chart 07/19/2022: see chart Goal status: ONGOING   2.  Pt will decrease NDI disability score to no greater than 40% for improved subjective  functional ability with work and community activities Baseline: 64% disability Goal status: INITIAL   3.  Pt will improve cervical flexor endurance test hold time to no less than 45 sec for improved postural endurance and decrease neck pain Baseline: 24 sec 07/19/2022: 25 sec Goal status: ONGOING   4.  Pt will self report neck pain no greater than 1-2/10 for improved comfort and functional ability Baseline: 8/10 at worst 07/19/2022: 6/10 at worst Goal status: ONGOING     PLAN: PT FREQUENCY: 2x/week   PT DURATION: 6 weeks   PLANNED INTERVENTIONS: Therapeutic exercises, Therapeutic activity, Neuromuscular re-education, Balance training, Gait training, Patient/Family education, Self Care, Joint mobilization, Dry Needling, Electrical stimulation, Cryotherapy, Moist heat, Manual therapy, and Re-evaluation   PLAN FOR NEXT SESSION: assess HEP response, progress DNF and periscapular strength, manual and TPDN to neck and upper traps    Ward Chatters, PT 07/31/2022, 1:43 PM

## 2022-08-02 ENCOUNTER — Ambulatory Visit: Payer: Medicaid Other

## 2022-08-02 DIAGNOSIS — M542 Cervicalgia: Secondary | ICD-10-CM

## 2022-08-02 DIAGNOSIS — M6281 Muscle weakness (generalized): Secondary | ICD-10-CM

## 2022-08-02 NOTE — Therapy (Signed)
OUTPATIENT PHYSICAL THERAPY TREATMENT NOTE   Patient Name: Monique [name] MRN: [id number] DOB:1986-09-04, 36 y.o., female Today's Date: 08/02/2022  PCP: Ladell Pier, MD REFERRING PROVIDER: Izora Ribas, MD  END OF SESSION:   PT End of Session - 08/02/22 1814     Visit Number 6    Number of Visits 17    Date for PT Re-Evaluation 08/21/22    Authorization Type [employer]    PT Start Time 1815    PT Stop Time 1900    PT Time Calculation (min) 45 min    Activity Tolerance Patient tolerated treatment well    Behavior During Therapy WFL for tasks assessed/performed                Past Medical History:  Diagnosis Date   Anemia    Anxiety    Cervicalgia    Chronic migraine with aura    Chronic pain syndrome    Cognitive deficit as late effect of traumatic brain injury ([place])    Depression    H/O cervical fracture    Heart murmur    Menorrhagia    TBI (traumatic brain injury) [facility])    Past Surgical History:  Procedure Laterality Date   APPENDECTOMY     BONE EXCISION Right 02/02/2021   Procedure: OSTEOPHYTE EXCISION;  Surgeon: Altamese Aurora, MD;  Location: [place];  Service: Orthopedics;  Laterality: Right;   Ceasarean section  2007, 2010,2014   EXTERNAL FIXATION LEG Bilateral 12/14/2012   Procedure: EXTERNAL FIXATION LEG;  Surgeon: [name], MD;  Location: [place];  Service: Orthopedics;  Laterality: Bilateral;   EXTERNAL FIXATION REMOVAL Bilateral 12/23/2012   Procedure: REMOVAL EXTERNAL FIXATION LEG;  Surgeon: [name], MD;  Location: [place];  Service: Orthopedics;  Laterality: Bilateral;   HARDWARE REMOVAL Right 02/02/2021   Procedure: HARDWARE REMOVAL;  Surgeon: Altamese [place], MD;  Location: [place];  Service: Orthopedics;  Laterality: Right;   I & D EXTREMITY Left 12/14/2012   Procedure: IRRIGATION AND DEBRIDEMENT EXTREMITY;  Surgeon: [name], MD;  Location: [place];  Service: Orthopedics;  Laterality: Left;   ORIF ANKLE FRACTURE Left  12/14/2012   Procedure: OPEN REDUCTION INTERNAL FIXATION (ORIF) ANKLE FRACTURE;  Surgeon: [name], MD;  Location: [place];  Service: Orthopedics;  Laterality: Left;   ORIF TIBIA FRACTURE Bilateral 12/23/2012   Procedure: OPEN REDUCTION INTERNAL FIXATION (ORIF) BILATERAL DISTAL TIBIA FRACTURE;  Surgeon: [name], MD;  Location: [place];  Service: Orthopedics;  Laterality: Bilateral;   Patient Active Problem List   Diagnosis Date Noted   Fibroids 10/19/2020   History of uterine fibroid 08/12/2020   Menorrhagia with irregular cycle 08/12/2020   Gastroesophageal reflux disease without esophagitis 06/02/2020   Keloid of skin 06/02/2020   Vitamin D deficiency 04/29/2019   Iron deficiency anemia    Iliotibial band syndrome affecting right lower leg 11/05/2018   Therapeutic opioid induced constipation 11/05/2018   Chronic pain syndrome 05/12/2018   Cognitive deficit as late effect of traumatic brain injury ([place]) 08/31/2014   TBI (traumatic brain injury) ([place]) 09/16/2013   Tibial nerve lesion 03/31/2013   Neck mass 02/06/2013   New onset of headaches due to trauma 01/02/2013   Migraine 12/21/2012   Concussion 12/18/2012   Closed C2 fracture ([place]) 12/15/2012   Fracture, sternum closed 12/15/2012   Bilateral ankle fractures 12/15/2012   Pulmonary contusion 12/15/2012    REFERRING DIAG: M54.2 (ICD-10-CM) - Cervicalgia  THERAPY DIAG:  Cervicalgia  Muscle weakness (generalized)  Rationale for Evaluation and Treatment Rehabilitation  PERTINENT HISTORY: Past C2 fx, TBI, ankle fx's  PRECAUTIONS: None  SUBJECTIVE: Patient reports continued pain in upper traps.   PAIN:  Are you having pain?  Yes: NPRS scale: 4/10 (8-9/10 at worst) Pain location: neck, upper traps Pain description: stiffness Aggravating factors: sitting at desk Relieving factors: none   OBJECTIVE: (objective measures completed at initial evaluation unless otherwise dated)  PATIENT SURVEYS:  NDI: 64%  disability - eval NDI: 58% disability - 07/19/2022 NDI: 46 % disability - 08/02/2022   COGNITION: Overall cognitive status: Within functional limits for tasks assessed   SENSATION: WFL   POSTURE: rounded shoulders and forward head   PALPATION: TTP to bilateral upper traps and cervical paraspinals       CERVICAL ROM:    Active ROM A/PROM (deg) eval A/PROM (deg) 07/19/2022  Flexion     Extension     Right lateral flexion     Left lateral flexion     Right rotation 35 38  Left rotation 30 30   (Blank rows = not tested)   UE Myotomes:   Myotome Right Left  C5 5/5 5/5  C6 5/5 5/5  C7 5/5 5/5  C8 5/5 5/5  T1 5/5 5/5    UPPER EXTREMITY ROM:   Active ROM Right eval Left eval  Shoulder flexion [facility]  Shoulder extension      Shoulder abduction [facility]  Shoulder adduction      Shoulder extension      Shoulder internal rotation      Shoulder external rotation      Elbow flexion      Elbow extension       (Blank rows = not tested)   UPPER EXTREMITY MMT:   MMT Right eval Left eval  Shoulder flexion [facility]  Shoulder extension      Shoulder abduction [facility]  Shoulder adduction      Shoulder extension      Shoulder internal rotation      Shoulder external rotation      Middle trapezius 3+/5 3+/5  Lower trapezius 3+/5 3+/5  Elbow flexion      Elbow extension      Wrist flexion      Wrist extension      Wrist ulnar deviation      Wrist radial deviation      Wrist pronation      Wrist supination      Grip strength       (Blank rows = not tested)   FUNCTIONAL TESTS:  Cervical flexor endurance test: 25 sec     TODAY'S TREATMENT:  [facility] Adult PT Treatment:                                                DATE: 08/02/2022 Therapeutic Exercise: UBE lvl 1.0 x 4 min while taking subjective  Seated high/low row 25# 3x10 each Standing horizontal abd  back against wall 2x15 GTB S/L open book x 10 each Standing chin tuck against wall x 10 - 5" hold Pball  roll up wall with alternating UE lift off x10 Manual Therapy: Suboccipital release Positional release upper traps STM to cervical paraspinals Self Care: Theracane self use instruction   [place] Adult PT Treatment:  DATE: 07/31/2022 Therapeutic Exercise: UBE lvl 1.0 x 4 min while taking subjective  Seated row 2x10 25# Seated bilateral ER 2x15 GTB Seated horizontal abd 2x15 GTB Supine chin tuck 2x10 - 5" hold S/L open book x 10 each Supine serratus punch 2x10 3# BIL Standing chin tuck against wall x 10 - 5" hold Manual Therapy: Suboccipital release Positional release upper traps STM to cervical paraspinals Modalities:  MHP to cervical paraspinals x 10 min post session  [place] Adult PT Treatment:                                                DATE: 07/19/2022 Therapeutic Exercise: UBE lvl 1.5 x 3 min while taking subjective  Seated row 2x10 25# Serratus foam roll YTB x 10 Row 3x10 23# Bilateral ER 2x10 GTB Supine horizontal abd 3x10 GTB Supine chin tuck 2x10 - 5" hold Supine serratus punch x 10 3# each Manual Therapy: Suboccipital release Positional release upper traps STM to cervical paraspinals Modalities:  MHP to cervical paraspinals x 10 min post session    PATIENT EDUCATION:  Education details: continue HEP Person educated: Patient Education method: Explanation, Demonstration, and Handouts Education comprehension: verbalized understanding and returned demonstration     HOME EXERCISE PROGRAM: Access Code: [email or web address] URL: [email or web address] Date: 06/26/2022 Prepared by: Octavio Manns   Exercises - Median Nerve Tensioner  - 1 x daily - 7 x weekly - 2 sets - 10 reps - Standing Shoulder Row with Anchored Resistance  - 1 x daily - 7 x weekly - 3 sets - 10 reps - green  hold - Supine Chin Tuck  - 1 x daily - 7 x weekly - 2 sets - 10 reps - 3 sec hold   ASSESSMENT:   CLINICAL IMPRESSION: Patient presents  to PT with continued upper neck and back pain and reports HEP compliance. Session today continued to focus on periscapular strengthening as well as manual techniques to decrease tissue restriction in BIL upper traps. Instructed patient in use of theracane to manage tension and pain at home. She is interested in dry needling in future sessions. Patient was able to tolerate all prescribed exercises with no adverse effects. Patient continues to benefit from skilled PT services and should be progressed as able to improve functional independence.     OBJECTIVE IMPAIRMENTS decreased activity tolerance, decreased mobility, decreased ROM, decreased strength, postural dysfunction, and pain.    ACTIVITY LIMITATIONS carrying, lifting, reach over head, and caring for others   PARTICIPATION LIMITATIONS: driving, community activity, and occupation   PERSONAL FACTORS Time since onset of injury/illness/exacerbation and 1-2 comorbidities: Past C2 fx, TBI, ankle fx's  are also affecting patient's functional outcome.      GOALS: Goals reviewed with patient? No   SHORT TERM GOALS: Target date: 07/17/2022    Pt will be compliant and knowledgeable with initial HEP for improved comfort and carryover Baseline: initial HEP given  Goal status: MET   2.  Pt will self report neck pain no greater than 5/10 for improved comfort and functional ability Baseline: 8/10 at worst 07/19/2022: 6/10 at worst Goal status: ONGOING   LONG TERM GOALS: Target date: 08/21/2022   Pt will improve bilateral cervical rotation to at least 60 deg for improved functional ability with driving Baseline: see chart 07/19/2022: see chart Goal status: ONGOING   2.  Pt will  decrease NDI disability score to no greater than 40% for improved subjective functional ability with work and community activities Baseline: 64% disability Goal status: Ongoing 08/02/22: 46%   3.  Pt will improve cervical flexor endurance test hold time to no less  than 45 sec for improved postural endurance and decrease neck pain Baseline: 24 sec 07/19/2022: 25 sec Goal status: ONGOING   4.  Pt will self report neck pain no greater than 1-2/10 for improved comfort and functional ability Baseline: 8/10 at worst 07/19/2022: 6/10 at worst Goal status: ONGOING     PLAN: PT FREQUENCY: 2x/week   PT DURATION: 6 weeks   PLANNED INTERVENTIONS: Therapeutic exercises, Therapeutic activity, Neuromuscular re-education, Balance training, Gait training, Patient/Family education, Self Care, Joint mobilization, Dry Needling, Electrical stimulation, Cryotherapy, Moist heat, Manual therapy, and Re-evaluation   PLAN FOR NEXT SESSION: assess HEP response, progress DNF and periscapular strength, manual and TPDN to neck and upper traps    Margarette Canada, PTA 08/02/2022, 6:15 PM

## 2022-08-06 ENCOUNTER — Ambulatory Visit: Payer: Medicaid Other

## 2022-08-06 DIAGNOSIS — M542 Cervicalgia: Secondary | ICD-10-CM

## 2022-08-06 DIAGNOSIS — M6281 Muscle weakness (generalized): Secondary | ICD-10-CM

## 2022-08-06 DIAGNOSIS — G8929 Other chronic pain: Secondary | ICD-10-CM

## 2022-08-06 NOTE — Therapy (Signed)
OUTPATIENT PHYSICAL THERAPY TREATMENT NOTE   Patient Name: Monique [name] MRN: [id number] DOB:07-08-1986, 36 y.o., female Today's Date: 08/06/2022  PCP: Ladell Pier, MD REFERRING PROVIDER: Izora Ribas, MD  END OF SESSION:   PT End of Session - 08/06/22 1212     Visit Number 7    Number of Visits 17    Date for PT Re-Evaluation 08/21/22    Authorization Type [employer]    PT Start Time 1215    PT Stop Time 1253    PT Time Calculation (min) 38 min    Activity Tolerance Patient tolerated treatment well    Behavior During Therapy WFL for tasks assessed/performed                 Past Medical History:  Diagnosis Date   Anemia    Anxiety    Cervicalgia    Chronic migraine with aura    Chronic pain syndrome    Cognitive deficit as late effect of traumatic brain injury ([place])    Depression    H/O cervical fracture    Heart murmur    Menorrhagia    TBI (traumatic brain injury) [facility])    Past Surgical History:  Procedure Laterality Date   APPENDECTOMY     BONE EXCISION Right 02/02/2021   Procedure: OSTEOPHYTE EXCISION;  Surgeon: Altamese [place], MD;  Location: [place];  Service: Orthopedics;  Laterality: Right;   Ceasarean section  2007, 2010,2014   EXTERNAL FIXATION LEG Bilateral 12/14/2012   Procedure: EXTERNAL FIXATION LEG;  Surgeon: [name], MD;  Location: [place];  Service: Orthopedics;  Laterality: Bilateral;   EXTERNAL FIXATION REMOVAL Bilateral 12/23/2012   Procedure: REMOVAL EXTERNAL FIXATION LEG;  Surgeon: [name], MD;  Location: [place];  Service: Orthopedics;  Laterality: Bilateral;   HARDWARE REMOVAL Right 02/02/2021   Procedure: HARDWARE REMOVAL;  Surgeon: Altamese [place], MD;  Location: [place];  Service: Orthopedics;  Laterality: Right;   I & D EXTREMITY Left 12/14/2012   Procedure: IRRIGATION AND DEBRIDEMENT EXTREMITY;  Surgeon: [name], MD;  Location: [place];  Service: Orthopedics;  Laterality: Left;   ORIF ANKLE FRACTURE  Left 12/14/2012   Procedure: OPEN REDUCTION INTERNAL FIXATION (ORIF) ANKLE FRACTURE;  Surgeon: [name], MD;  Location: [place];  Service: Orthopedics;  Laterality: Left;   ORIF TIBIA FRACTURE Bilateral 12/23/2012   Procedure: OPEN REDUCTION INTERNAL FIXATION (ORIF) BILATERAL DISTAL TIBIA FRACTURE;  Surgeon: [name], MD;  Location: [place];  Service: Orthopedics;  Laterality: Bilateral;   Patient Active Problem List   Diagnosis Date Noted   Fibroids 10/19/2020   History of uterine fibroid 08/12/2020   Menorrhagia with irregular cycle 08/12/2020   Gastroesophageal reflux disease without esophagitis 06/02/2020   Keloid of skin 06/02/2020   Vitamin D deficiency 04/29/2019   Iron deficiency anemia    Iliotibial band syndrome affecting right lower leg 11/05/2018   Therapeutic opioid induced constipation 11/05/2018   Chronic pain syndrome 05/12/2018   Cognitive deficit as late effect of traumatic brain injury ([place]) 08/31/2014   TBI (traumatic brain injury) ([place]) 09/16/2013   Tibial nerve lesion 03/31/2013   Neck mass 02/06/2013   New onset of headaches due to trauma 01/02/2013   Migraine 12/21/2012   Concussion 12/18/2012   Closed C2 fracture ([place]) 12/15/2012   Fracture, sternum closed 12/15/2012   Bilateral ankle fractures 12/15/2012   Pulmonary contusion 12/15/2012    REFERRING DIAG: M54.2 (ICD-10-CM) - Cervicalgia  THERAPY DIAG:  Cervicalgia  Muscle weakness (generalized)  Chronic right shoulder pain  Rationale for Evaluation and Treatment Rehabilitation  PERTINENT HISTORY: Past C2 fx, TBI, ankle fx's  PRECAUTIONS: None  SUBJECTIVE: Pt presents to PT with reports of continued neck and upper trap pain. Has been compliant with HEP with no adverse effect. Pt is ready to begin PT at this time.   PAIN:  Are you having pain?  Yes: NPRS scale: 4/10 (8-9/10 at worst) Pain location: neck, upper traps Pain description: stiffness Aggravating factors: sitting at  desk Relieving factors: none   OBJECTIVE: (objective measures completed at initial evaluation unless otherwise dated)  PATIENT SURVEYS:  NDI: 64% disability - eval NDI: 58% disability - 07/19/2022 NDI: 46 % disability - 08/02/2022   COGNITION: Overall cognitive status: Within functional limits for tasks assessed   SENSATION: WFL   POSTURE: rounded shoulders and forward head   PALPATION: TTP to bilateral upper traps and cervical paraspinals       CERVICAL ROM:    Active ROM A/PROM (deg) eval A/PROM (deg) 07/19/2022  Flexion     Extension     Right lateral flexion     Left lateral flexion     Right rotation 35 38  Left rotation 30 30   (Blank rows = not tested)   UE Myotomes:   Myotome Right Left  C5 5/5 5/5  C6 5/5 5/5  C7 5/5 5/5  C8 5/5 5/5  T1 5/5 5/5    UPPER EXTREMITY ROM:   Active ROM Right eval Left eval  Shoulder flexion [facility]  Shoulder extension      Shoulder abduction [facility]  Shoulder adduction      Shoulder extension      Shoulder internal rotation      Shoulder external rotation      Elbow flexion      Elbow extension       (Blank rows = not tested)   UPPER EXTREMITY MMT:   MMT Right eval Left eval  Shoulder flexion [facility]  Shoulder extension      Shoulder abduction [facility]  Shoulder adduction      Shoulder extension      Shoulder internal rotation      Shoulder external rotation      Middle trapezius 3+/5 3+/5  Lower trapezius 3+/5 3+/5  Elbow flexion      Elbow extension      Wrist flexion      Wrist extension      Wrist ulnar deviation      Wrist radial deviation      Wrist pronation      Wrist supination      Grip strength       (Blank rows = not tested)   FUNCTIONAL TESTS:  Cervical flexor endurance test: 25 sec     TODAY'S TREATMENT:  [facility] Adult PT Treatment:                                                DATE: 08/06/2022 Therapeutic Exercise: UBE lvl 1.0 x 4 min while taking subjective  Seated high/low  row 25# 3x10 each Seated bilateral ER GTB 2x15 Supine horizontal abd  back against wall 2x15 GTB S/L open book x 15 each Qped thread the needle with soft foam x 10 each Standing chin tuck against wall x  10 - 5" hold Thoracic ext stretch up wall with soft foam x 10 Modalities: MHP to cervical paraspinals x 8 min in sitting post session Trigger Point Dry Needling Treatment: Pre-treatment instruction: Patient instructed on dry needling rationale, procedures, and possible side effects including pain during treatment (achy,cramping feeling), bruising, drop of blood, lightheadedness, nausea, sweating. Patient Consent Given: No Education handout provided: Yes Muscles treated: bilateral upper traps  Needle size and number: .30x40m x 4 Electrical stimulation performed: No Parameters: N/A Treatment response/outcome: Palpable decrease in muscle tension Post-treatment instructions: Patient instructed to expect possible mild to moderate muscle soreness later today and/or tomorrow. Patient instructed in methods to reduce muscle soreness and to continue prescribed HEP. If patient was dry needled over the lung field, patient was instructed on signs and symptoms of pneumothorax and, however unlikely, to see immediate medical attention should they occur. Patient was also educated on signs and symptoms of infection and to seek medical attention should they occur. Patient verbalized understanding of these instructions and education.   [facility]                                                DATE: 08/[facility]/2022 Therapeutic Exercise: UBE lvl 1.0 x 4 min while taking subjective  Seated high/low row 25# 3x10 each Standing horizontal abd  back against wall 2x15 GTB S/L open book x 10 each Standing chin tuck against wall x 10 - 5" hold Pball roll up wall with alternating UE lift off x10 Manual Therapy: Suboccipital release Positional release upper traps STM to cervical paraspinals Self  Care: Theracane self use instruction   [facility]:                                                DATE: 07/31/2022 Therapeutic Exercise: UBE lvl 1.0 x 4 min while taking subjective  Seated row 2x10 25# Seated bilateral ER 2x15 GTB Seated horizontal abd 2x15 GTB Supine chin tuck 2x10 - 5" hold S/L open book x 10 each Supine serratus punch 2x10 3# BIL Standing chin tuck against wall x 10 - 5" hold Manual Therapy: Suboccipital release Positional release upper traps STM to cervical paraspinals Modalities:  MHP to cervical paraspinals x 10 min post session  [facility] Treatment:                                                DATE: 07/19/2022 Therapeutic Exercise: UBE lvl 1.5 x 3 min while taking subjective  Seated row 2x10 25# Serratus foam roll YTB x 10 Row 3x10 23# Bilateral ER 2x10 GTB Supine horizontal abd 3x10 GTB Supine chin tuck 2x10 - 5" hold Supine serratus punch x 10 3# each Manual Therapy: Suboccipital release Positional release upper traps STM to cervical paraspinals Modalities:  MHP to cervical paraspinals x 10 min post session    PATIENT EDUCATION:  Education details: continue HEP Person educated: Patient Education method: Explanation, Demonstration, and Handouts Education comprehension: verbalized understanding and returned demonstration     HOME EXERCISE PROGRAM: Access Code: [email or web address]: [email or web address] Date: 06/26/2022  Prepared by: Octavio Manns   Exercises - Median Nerve Tensioner  - 1 x daily - 7 x weekly - 2 sets - 10 reps - Standing Shoulder Row with Anchored Resistance  - 1 x daily - 7 x weekly - 3 sets - 10 reps - green  hold - Supine Chin Tuck  - 1 x daily - 7 x weekly - 2 sets - 10 reps - 3 sec hold   ASSESSMENT:   CLINICAL IMPRESSION: Pt was able to complete all prescribed exercises with no adverse effect. Responded well to TPDN today, handout issued and expectations reviewed. Therapy focused on  improving DNF and periscapular strength as well as thoracic mobility for decreasing pain and improving comfort. Will continue to progress as able per POC.     OBJECTIVE IMPAIRMENTS decreased activity tolerance, decreased mobility, decreased ROM, decreased strength, postural dysfunction, and pain.    ACTIVITY LIMITATIONS carrying, lifting, reach over head, and caring for others   PARTICIPATION LIMITATIONS: driving, community activity, and occupation   PERSONAL FACTORS Time since onset of injury/illness/exacerbation and 1-2 comorbidities: Past C2 fx, TBI, ankle fx's  are also affecting patient's functional outcome.      GOALS: Goals reviewed with patient? No   SHORT TERM GOALS: Target date: 07/17/2022    Pt will be compliant and knowledgeable with initial HEP for improved comfort and carryover Baseline: initial HEP given  Goal status: MET   2.  Pt will self report neck pain no greater than 5/10 for improved comfort and functional ability Baseline: 8/10 at worst 07/19/2022: 6/10 at worst Goal status: ONGOING   LONG TERM GOALS: Target date: 08/21/2022   Pt will improve bilateral cervical rotation to at least 60 deg for improved functional ability with driving Baseline: see chart 07/19/2022: see chart Goal status: ONGOING   2.  Pt will decrease NDI disability score to no greater than 40% for improved subjective functional ability with work and community activities Baseline: 64% disability Goal status: Ongoing 08/02/22: 46%   3.  Pt will improve cervical flexor endurance test hold time to no less than 45 sec for improved postural endurance and decrease neck pain Baseline: 24 sec 07/19/2022: 25 sec Goal status: ONGOING   4.  Pt will self report neck pain no greater than 1-2/10 for improved comfort and functional ability Baseline: 8/10 at worst 07/19/2022: 6/10 at worst Goal status: ONGOING     PLAN: PT FREQUENCY: 2x/week   PT DURATION: 6 weeks   PLANNED INTERVENTIONS:  Therapeutic exercises, Therapeutic activity, Neuromuscular re-education, Balance training, Gait training, Patient/Family education, Self Care, Joint mobilization, Dry Needling, Electrical stimulation, Cryotherapy, Moist heat, Manual therapy, and Re-evaluation   PLAN FOR NEXT SESSION: assess HEP response, progress DNF and periscapular strength, manual and TPDN to neck and upper traps    Ward Chatters, PT 08/06/2022, 1:33 PM

## 2022-08-08 ENCOUNTER — Encounter: Payer: Self-pay | Admitting: Adult Health

## 2022-08-08 ENCOUNTER — Ambulatory Visit (INDEPENDENT_AMBULATORY_CARE_PROVIDER_SITE_OTHER): Payer: Medicaid Other | Admitting: Adult Health

## 2022-08-08 VITALS — BP 121/56 | HR 84 | Ht 65.0 in | Wt 201.5 lb

## 2022-08-08 DIAGNOSIS — R251 Tremor, unspecified: Secondary | ICD-10-CM | POA: Diagnosis not present

## 2022-08-08 DIAGNOSIS — M542 Cervicalgia: Secondary | ICD-10-CM | POA: Diagnosis not present

## 2022-08-08 DIAGNOSIS — R200 Anesthesia of skin: Secondary | ICD-10-CM | POA: Diagnosis not present

## 2022-08-08 NOTE — Progress Notes (Signed)
[facility]  PATIENT: Monique [name] DOB: 11-09-1985  REFERRING CLINICIAN: Ladell Pier, MD HISTORY FROM: self REASON FOR VISIT: right hand numbness, tremors   HISTORICAL  CHIEF COMPLAINT:  Chief Complaint  Patient presents with   follow up right arm numbness tremors    Pt is well. She is asking what can be done with her right hand numbness and tremors. Room 2 alone     HISTORY OF PRESENT ILLNESS:   Update 08/08/2022 JM: Patient returns for sooner scheduled visit to ask about treatment options for right hand numbness and tremor.  Previously seen by Dr. Billey Gosling 1 month ago for right hand numbness and tremor over the past several months.  EMG ordered at last visit, was sent to [facility] but patient denies being contact by them yet to schedule. Reports symptoms have been the same since prior visit. Continues to have episodes of lightheadedness typically associated when going from sitting to standing or some type of movement, can feel like she may pass out.  Will have associated right arm numbness and feels off balance.  She continues to have difficulty holding objects.  No new or worsening symptoms since prior visit.  Currently working with PT for cervicalgia.    Consult visit 07/09/2022 Dr. Billey Gosling:  The patient presents for evaluation of right hand numbness and tremors which began 3 months ago. This typically occurs when she goes from sitting to standing. She will feel lightheaded and develop palpitations and blurred vision.  Whole arm will go numb. Her right arm will start shaking and sometimes she will feel like she will lose her balance. Sometimes her whole body will start to shake. She will often try to grab water, but will have grip strength weakness and drop the water. She has never lost consciousness.  She continues to have pain in her neck which radiates down her arm. MRI C-spine in October 2022 showed moderate spinal stenosis at C5-6. MRI brain was  normal.  She does have a history of lightheadedness. Previously had episodes where she would get lightheaded when she leans forward. This was not associated with limb shaking or weakness.   OTHER MEDICAL CONDITIONS: iron deficiency anemia, vitamin D deficiency, TBI   REVIEW OF SYSTEMS: Full 14 system review of systems performed and negative with exception of: right arm weakness, numbness, tremors  ALLERGIES: No Known Allergies  HOME MEDICATIONS: Outpatient Medications Prior to Visit  Medication Sig Dispense Refill   cyanocobalamin (VITAMIN B12) 1000 MCG/ML injection Inject 1 mL (1,000 mcg total) into the muscle every 30 (thirty) days. 1 mL 11   ferrous sulfate 325 (65 FE) MG tablet Take 1 tablet (325 mg total) by mouth daily with breakfast. 100 tablet 1   Needles & Syringes MISC 1 each by Does not apply route every 30 (thirty) days. Please given 25 gauge 1 1/2 inch needle and syringe to be used to inject B12 monthly injections, thank you! 1 each 11   Vitamin D, Ergocalciferol, (DRISDOL) 1.25 MG (50000 UNIT) CAPS capsule Take 1 capsule (50,000 Units total) by mouth every 7 (seven) days. 20 capsule 0   No facility-administered medications prior to visit.    PAST MEDICAL HISTORY: Past Medical History:  Diagnosis Date   Anemia    Anxiety    Cervicalgia    Chronic migraine with aura    Chronic pain syndrome    Cognitive deficit as late effect of traumatic brain injury ([place])    Depression    H/O cervical  fracture    Heart murmur    Menorrhagia    TBI (traumatic brain injury) ([place])     PAST SURGICAL HISTORY: Past Surgical History:  Procedure Laterality Date   APPENDECTOMY     BONE EXCISION Right 02/02/2021   Procedure: OSTEOPHYTE EXCISION;  Surgeon: Altamese Northfork, MD;  Location: [place];  Service: Orthopedics;  Laterality: Right;   Ceasarean section  2007, 2010,2014   EXTERNAL FIXATION LEG Bilateral 12/14/2012   Procedure: EXTERNAL FIXATION LEG;  Surgeon: [name], MD;   Location: [place];  Service: Orthopedics;  Laterality: Bilateral;   EXTERNAL FIXATION REMOVAL Bilateral 12/23/2012   Procedure: REMOVAL EXTERNAL FIXATION LEG;  Surgeon: [name], MD;  Location: [place];  Service: Orthopedics;  Laterality: Bilateral;   HARDWARE REMOVAL Right 02/02/2021   Procedure: HARDWARE REMOVAL;  Surgeon: Altamese , MD;  Location: [place];  Service: Orthopedics;  Laterality: Right;   I & D EXTREMITY Left 12/14/2012   Procedure: IRRIGATION AND DEBRIDEMENT EXTREMITY;  Surgeon: [name], MD;  Location: [place];  Service: Orthopedics;  Laterality: Left;   ORIF ANKLE FRACTURE Left 12/14/2012   Procedure: OPEN REDUCTION INTERNAL FIXATION (ORIF) ANKLE FRACTURE;  Surgeon: [name], MD;  Location: [place];  Service: Orthopedics;  Laterality: Left;   ORIF TIBIA FRACTURE Bilateral 12/23/2012   Procedure: OPEN REDUCTION INTERNAL FIXATION (ORIF) BILATERAL DISTAL TIBIA FRACTURE;  Surgeon: [name], MD;  Location: [place];  Service: Orthopedics;  Laterality: Bilateral;    FAMILY HISTORY: Family History  Problem Relation Age of Onset   Diabetes Father    Cancer Paternal Grandmother        Ovarian    SOCIAL HISTORY: Social History   Socioeconomic History   Marital status: Single    Spouse name: Not on file   Number of children: 3   Years of education: Not on file   Highest education level: Some college, no degree  Occupational History   Occupation: Disability  Tobacco Use   Smoking status: Never    Passive exposure: Yes   Smokeless tobacco: Never  Vaping Use   Vaping Use: Never used  Substance and Sexual Activity   Alcohol use: Not Currently    Comment: occasional   Drug use: No   Sexual activity: Yes    Birth control/protection: None  Other Topics Concern   Not on file  Social History Narrative   Not on file   Social Determinants of Health   Financial Resource Strain: Not on file  Food Insecurity: Not on file  Transportation Needs: Not on file   Physical Activity: Not on file  Stress: Not on file  Social Connections: Not on file  Intimate Partner Violence: Not on file     PHYSICAL EXAM  GENERAL EXAM/CONSTITUTIONAL: Vitals:  Vitals:   08/08/22 1406  BP: (!) 121/56  Pulse: 84  Weight: 201 lb 8 oz (91.4 kg)  Height: '5\' 5"'$  (1.651 m)    Body mass index is 33.53 kg/m. Wt Readings from Last 3 Encounters:  08/08/22 201 lb 8 oz (91.4 kg)  07/09/22 203 lb (92.1 kg)  06/19/22 205 lb 9.6 oz (93.3 kg)   No data found.  NEUROLOGIC: MENTAL STATUS:  awake, alert, oriented to person, place and time recent and remote memory intact normal attention and concentration  CRANIAL NERVE:  2nd, 3rd, 4th, 6th - pupils equal and reactive to light, visual fields full to confrontation, extraocular muscles intact, no nystagmus 5th - facial sensation symmetric  7th - facial strength symmetric 8th - hearing intact 9th - palate elevates symmetrically, uvula midline 11th - shoulder shrug symmetric 12th - tongue protrusion midline  MOTOR:  normal bulk and tone, full strength in the BUE, BLE  SENSORY:  normal and symmetric to light touch all 4 extremities  COORDINATION:  finger-nose-finger intact bilaterally  REFLEXES:  deep tendon reflexes present and symmetric  GAIT/STATION:  normal     DIAGNOSTIC DATA (LABS, IMAGING, TESTING) - I reviewed patient records, labs, notes, testing and imaging myself where available.  Lab Results  Component Value Date   WBC 6.7 06/01/2022   HGB 9.4 (L) 06/01/2022   HCT 32.2 (L) 06/01/2022   MCV 71 (L) 06/01/2022   PLT 404 06/01/2022      Component Value Date/Time   NA 137 12/05/2021 1831   NA 137 06/02/2020 1031   K 3.7 12/05/2021 1831   CL 102 12/05/2021 1831   CO2 25 12/05/2021 1831   GLUCOSE 114 (H) 12/05/2021 1831   BUN 10 12/05/2021 1831   BUN 12 06/02/2020 1031   CREATININE 0.86 12/05/2021 1831   CALCIUM 9.0 12/05/2021 1831   PROT 8.2 (H) 10/12/2020 0420   PROT 6.8  06/02/2020 1031   ALBUMIN 4.3 10/12/2020 0420   ALBUMIN 4.2 06/02/2020 1031   AST 19 10/12/2020 0420   ALT 12 10/12/2020 0420   ALKPHOS 55 10/12/2020 0420   BILITOT 0.5 10/12/2020 0420   BILITOT 0.2 06/02/2020 1031   GFRNONAA >60 12/05/2021 1831   GFRAA 118 06/02/2020 1031   Lab Results  Component Value Date   CHOL 166 04/28/2019   HDL 58.40 04/28/2019   LDLCALC 96 04/28/2019   TRIG 58.0 04/28/2019   CHOLHDL 3 04/28/2019   Lab Results  Component Value Date   HGBA1C 5.9 (H) 12/14/2012   Lab Results  Component Value Date   HDQQIWLN98 921 06/19/2022   Lab Results  Component Value Date   TSH 1.04 04/28/2019    ASSESSMENT AND PLAN  36 y.o. year old female with a history of iron deficiency anemia, vitamin D deficiency, TBI who presents for evaluation of orthostatic right arm numbness and shaking, initial evaluation with Dr. Billey Gosling 07/09/2022. MRI C-spine with moderate C5-6 stenosis. Will check EMG/NCV for peripheral causes of right arm numbness. However there appears to be a strong orthostatic component to her symptoms. While she is not hypotensive on orthostatic vitals today, her HR does significantly increase from sitting to standing. If EMG is normal would consider vessel imaging of the head and neck. It is also possible for cervical stenosis to cause symptoms of dysautonomia. If neurologic workup is negative she may benefit from cardiology consult as she does report palpitations with these symptoms.   1. Right arm numbness   2. Tremor      PLAN: -complete EMG/NCV - contacted [facility] - has tried to contact patient several times to schedule, advised patient to call to schedule -Next steps: consider CTA head/neck if EMG negative -discuss cardiology consult with PCP   Follow-up will be determined after completion of the above    I spent 21 minutes of face-to-face and non-face-to-face time with patient.  This included previsit chart review, lab review, study review,  order entry, electronic health record documentation, patient education and discussion regarding above diagnoses and treatment plan and answered all other questions to patient satisfaction  Frann Rider, [facility]  [facility] [address] [place], [place] [address]  Phone [phone] Fax [phone] Note: This  document was prepared with digital dictation and possible smart phrase technology. Any transcriptional errors that result from this process are unintentional.

## 2022-08-09 ENCOUNTER — Ambulatory Visit: Payer: Medicaid Other

## 2022-08-09 ENCOUNTER — Ambulatory Visit: Payer: Medicaid Other | Attending: Physical Medicine and Rehabilitation

## 2022-08-09 DIAGNOSIS — R2681 Unsteadiness on feet: Secondary | ICD-10-CM | POA: Insufficient documentation

## 2022-08-09 DIAGNOSIS — M542 Cervicalgia: Secondary | ICD-10-CM | POA: Diagnosis not present

## 2022-08-09 DIAGNOSIS — M6281 Muscle weakness (generalized): Secondary | ICD-10-CM | POA: Diagnosis present

## 2022-08-09 DIAGNOSIS — G8929 Other chronic pain: Secondary | ICD-10-CM | POA: Diagnosis present

## 2022-08-09 DIAGNOSIS — M25511 Pain in right shoulder: Secondary | ICD-10-CM | POA: Diagnosis present

## 2022-08-09 NOTE — Therapy (Signed)
OUTPATIENT PHYSICAL THERAPY TREATMENT NOTE   Patient Name: Monique [name] MRN: [id number] DOB:17-Mar-1986, 36 y.o., female Today's Date: 08/09/2022  PCP: Ladell Pier, MD REFERRING PROVIDER: Izora Ribas, MD  END OF SESSION:   PT End of Session - 08/09/22 1616     Visit Number 8    Number of Visits 17    Date for PT Re-Evaluation 08/21/22    Authorization Type [employer]    PT Start Time 1615    PT Stop Time 1655    PT Time Calculation (min) 40 min    Activity Tolerance Patient tolerated treatment well    Behavior During Therapy WFL for tasks assessed/performed             Past Medical History:  Diagnosis Date   Anemia    Anxiety    Cervicalgia    Chronic migraine with aura    Chronic pain syndrome    Cognitive deficit as late effect of traumatic brain injury ([place])    Depression    H/O cervical fracture    Heart murmur    Menorrhagia    TBI (traumatic brain injury) [facility])    Past Surgical History:  Procedure Laterality Date   APPENDECTOMY     BONE EXCISION Right 02/02/2021   Procedure: OSTEOPHYTE EXCISION;  Surgeon: Altamese Solon, MD;  Location: [place];  Service: Orthopedics;  Laterality: Right;   Ceasarean section  2007, 2010,2014   EXTERNAL FIXATION LEG Bilateral 12/14/2012   Procedure: EXTERNAL FIXATION LEG;  Surgeon: [name], MD;  Location: [place];  Service: Orthopedics;  Laterality: Bilateral;   EXTERNAL FIXATION REMOVAL Bilateral 12/23/2012   Procedure: REMOVAL EXTERNAL FIXATION LEG;  Surgeon: [name], MD;  Location: [place];  Service: Orthopedics;  Laterality: Bilateral;   HARDWARE REMOVAL Right 02/02/2021   Procedure: HARDWARE REMOVAL;  Surgeon: Altamese Ephrata, MD;  Location: [place];  Service: Orthopedics;  Laterality: Right;   I & D EXTREMITY Left 12/14/2012   Procedure: IRRIGATION AND DEBRIDEMENT EXTREMITY;  Surgeon: [name], MD;  Location: [place];  Service: Orthopedics;  Laterality: Left;   ORIF ANKLE FRACTURE Left  12/14/2012   Procedure: OPEN REDUCTION INTERNAL FIXATION (ORIF) ANKLE FRACTURE;  Surgeon: [name], MD;  Location: [place];  Service: Orthopedics;  Laterality: Left;   ORIF TIBIA FRACTURE Bilateral 12/23/2012   Procedure: OPEN REDUCTION INTERNAL FIXATION (ORIF) BILATERAL DISTAL TIBIA FRACTURE;  Surgeon: [name], MD;  Location: [place];  Service: Orthopedics;  Laterality: Bilateral;   Patient Active Problem List   Diagnosis Date Noted   Fibroids 10/19/2020   History of uterine fibroid 08/12/2020   Menorrhagia with irregular cycle 08/12/2020   Gastroesophageal reflux disease without esophagitis 06/02/2020   Keloid of skin 06/02/2020   Vitamin D deficiency 04/29/2019   Iron deficiency anemia    Iliotibial band syndrome affecting right lower leg 11/05/2018   Therapeutic opioid induced constipation 11/05/2018   Chronic pain syndrome 05/12/2018   Cognitive deficit as late effect of traumatic brain injury ([place]) 08/31/2014   TBI (traumatic brain injury) ([place]) 09/16/2013   Tibial nerve lesion 03/31/2013   Neck mass 02/06/2013   New onset of headaches due to trauma 01/02/2013   Migraine 12/21/2012   Concussion 12/18/2012   Closed C2 fracture ([place]) 12/15/2012   Fracture, sternum closed 12/15/2012   Bilateral ankle fractures 12/15/2012   Pulmonary contusion 12/15/2012    REFERRING DIAG: M54.2 (ICD-10-CM) - Cervicalgia  THERAPY DIAG:  Cervicalgia  Muscle weakness (  generalized)  Chronic right shoulder pain  Unsteadiness on feet  Rationale for Evaluation and Treatment Rehabilitation  PERTINENT HISTORY: Past C2 fx, TBI, ankle fx's  PRECAUTIONS: None  SUBJECTIVE: Patient reports the TPDN went well, but that she is still sore and tense in her neck.   PAIN:  Are you having pain?  Yes: NPRS scale: 5/10 (8-9/10 at worst) Pain location: neck, upper traps Pain description: stiffness Aggravating factors: sitting at desk Relieving factors: none   OBJECTIVE: (objective measures  completed at initial evaluation unless otherwise dated)  PATIENT SURVEYS:  NDI: 64% disability - eval NDI: 58% disability - 07/19/2022 NDI: 46 % disability - 08/02/2022   COGNITION: Overall cognitive status: Within functional limits for tasks assessed   SENSATION: WFL   POSTURE: rounded shoulders and forward head   PALPATION: TTP to bilateral upper traps and cervical paraspinals       CERVICAL ROM:    Active ROM A/PROM (deg) eval A/PROM (deg) 07/19/2022  Flexion     Extension     Right lateral flexion     Left lateral flexion     Right rotation 35 38  Left rotation 30 30   (Blank rows = not tested)   UE Myotomes:   Myotome Right Left  C5 5/5 5/5  C6 5/5 5/5  C7 5/5 5/5  C8 5/5 5/5  T1 5/5 5/5    UPPER EXTREMITY ROM:   Active ROM Right eval Left eval  Shoulder flexion [facility]  Shoulder extension      Shoulder abduction [facility]  Shoulder adduction      Shoulder extension      Shoulder internal rotation      Shoulder external rotation      Elbow flexion      Elbow extension       (Blank rows = not tested)   UPPER EXTREMITY MMT:   MMT Right eval Left eval  Shoulder flexion [facility]  Shoulder extension      Shoulder abduction [facility]  Shoulder adduction      Shoulder extension      Shoulder internal rotation      Shoulder external rotation      Middle trapezius 3+/5 3+/5  Lower trapezius 3+/5 3+/5  Elbow flexion      Elbow extension      Wrist flexion      Wrist extension      Wrist ulnar deviation      Wrist radial deviation      Wrist pronation      Wrist supination      Grip strength       (Blank rows = not tested)   FUNCTIONAL TESTS:  Cervical flexor endurance test: 25 sec     TODAY'S TREATMENT:  [facility] Adult PT Treatment:                                                DATE: 08/09/2022 Therapeutic Exercise: UBE lvl 1.0 x 4 min while taking subjective  Seated high/low row 25# 3x10 each Seated bilateral ER GTB 2x15 Seated horizontal  abd  back 2x15 GTB S/L open book x 15 each Qped thread the needle with soft foam x 10 each Cat/cow x15 Manual Therapy: Suboccipital release STM to cervical paraspinals   [facility] Adult PT Treatment:  DATE: 08/06/2022 Therapeutic Exercise: UBE lvl 1.0 x 4 min while taking subjective  Seated high/low row 25# 3x10 each Seated bilateral ER GTB 2x15 Supine horizontal abd  back against wall 2x15 GTB S/L open book x 15 each Qped thread the needle with soft foam x 10 each Standing chin tuck against wall x 10 - 5" hold Thoracic ext stretch up wall with soft foam x 10 Modalities: MHP to cervical paraspinals x 8 min in sitting post session Trigger Point Dry Needling Treatment: Pre-treatment instruction: Patient instructed on dry needling rationale, procedures, and possible side effects including pain during treatment (achy,cramping feeling), bruising, drop of blood, lightheadedness, nausea, sweating. Patient Consent Given: No Education handout provided: Yes Muscles treated: bilateral upper traps  Needle size and number: .30x77m x 4 Electrical stimulation performed: No Parameters: N/A Treatment response/outcome: Palpable decrease in muscle tension Post-treatment instructions: Patient instructed to expect possible mild to moderate muscle soreness later today and/or tomorrow. Patient instructed in methods to reduce muscle soreness and to continue prescribed HEP. If patient was dry needled over the lung field, patient was instructed on signs and symptoms of pneumothorax and, however unlikely, to see immediate medical attention should they occur. Patient was also educated on signs and symptoms of infection and to seek medical attention should they occur. Patient verbalized understanding of these instructions and education.   [facility]                                                DATE: 08/02/2022 Therapeutic Exercise: UBE lvl 1.0 x 4 min  while taking subjective  Seated high/low row 25# 3x10 each Standing horizontal abd  back against wall 2x15 GTB S/L open book x 10 each Standing chin tuck against wall x 10 - 5" hold Pball roll up wall with alternating UE lift off x10 Manual Therapy: Suboccipital release Positional release upper traps STM to cervical paraspinals Self Care: Theracane self use instruction      PATIENT EDUCATION:  Education details: continue HEP Person educated: Patient Education method: Explanation, Demonstration, and Handouts Education comprehension: verbalized understanding and returned demonstration     HOME EXERCISE PROGRAM: Access Code: [email or web address]: [email or web address] Date: 06/26/2022 Prepared by: DOctavio Manns  Exercises - Median Nerve Tensioner  - 1 x daily - 7 x weekly - 2 sets - 10 reps - Standing Shoulder Row with Anchored Resistance  - 1 x daily - 7 x weekly - 3 sets - 10 reps - green  hold - Supine Chin Tuck  - 1 x daily - 7 x weekly - 2 sets - 10 reps - 3 sec hold   ASSESSMENT:   CLINICAL IMPRESSION: Patient presents to PT with continued reports of pain and tension in her neck and shoulders and states the TPDN went well but that her pain has returned. Session today continued to focus on periscapular strengthening and manual techniques to decrease tension and pain levels. Patient was able to tolerate all prescribed exercises with no adverse effects. Patient continues to benefit from skilled PT services and should be progressed as able to improve functional independence.   OBJECTIVE IMPAIRMENTS decreased activity tolerance, decreased mobility, decreased ROM, decreased strength, postural dysfunction, and pain.    ACTIVITY LIMITATIONS carrying, lifting, reach over head, and caring for others   PARTICIPATION LIMITATIONS: driving, community activity, and occupation   PERSONAL  FACTORS Time since onset of injury/illness/exacerbation and 1-2 comorbidities: Past C2 fx,  TBI, ankle fx's  are also affecting patient's functional outcome.      GOALS: Goals reviewed with patient? No   SHORT TERM GOALS: Target date: 07/17/2022    Pt will be compliant and knowledgeable with initial HEP for improved comfort and carryover Baseline: initial HEP given  Goal status: MET   2.  Pt will self report neck pain no greater than 5/10 for improved comfort and functional ability Baseline: 8/10 at worst 07/19/2022: 6/10 at worst Goal status: ONGOING   LONG TERM GOALS: Target date: 08/21/2022   Pt will improve bilateral cervical rotation to at least 60 deg for improved functional ability with driving Baseline: see chart 07/19/2022: see chart Goal status: ONGOING   2.  Pt will decrease NDI disability score to no greater than 40% for improved subjective functional ability with work and community activities Baseline: 64% disability Goal status: Ongoing 08/02/22: 46%   3.  Pt will improve cervical flexor endurance test hold time to no less than 45 sec for improved postural endurance and decrease neck pain Baseline: 24 sec 07/19/2022: 25 sec Goal status: ONGOING   4.  Pt will self report neck pain no greater than 1-2/10 for improved comfort and functional ability Baseline: 8/10 at worst 07/19/2022: 6/10 at worst Goal status: ONGOING     PLAN: PT FREQUENCY: 2x/week   PT DURATION: 6 weeks   PLANNED INTERVENTIONS: Therapeutic exercises, Therapeutic activity, Neuromuscular re-education, Balance training, Gait training, Patient/Family education, Self Care, Joint mobilization, Dry Needling, Electrical stimulation, Cryotherapy, Moist heat, Manual therapy, and Re-evaluation   PLAN FOR NEXT SESSION: assess HEP response, progress DNF and periscapular strength, manual and TPDN to neck and upper traps    Margarette Canada, PTA 08/09/2022, 4:55 PM

## 2022-08-13 ENCOUNTER — Encounter: Payer: Medicaid Other | Admitting: Physical Medicine and Rehabilitation

## 2022-08-14 ENCOUNTER — Ambulatory Visit: Payer: Medicaid Other

## 2022-08-14 DIAGNOSIS — M6281 Muscle weakness (generalized): Secondary | ICD-10-CM

## 2022-08-14 DIAGNOSIS — M542 Cervicalgia: Secondary | ICD-10-CM

## 2022-08-14 DIAGNOSIS — G8929 Other chronic pain: Secondary | ICD-10-CM

## 2022-08-14 DIAGNOSIS — R2681 Unsteadiness on feet: Secondary | ICD-10-CM

## 2022-08-14 NOTE — Therapy (Signed)
OUTPATIENT PHYSICAL THERAPY TREATMENT NOTE   Patient Name: Monique [name] MRN: [id number] DOB:21-Feb-1986, 36 y.o., female Today's Date: 08/14/2022  PCP: Ladell Pier, MD REFERRING PROVIDER: Izora Ribas, MD  END OF SESSION:   PT End of Session - 08/14/22 1132     Visit Number 9    Number of Visits 17    Date for PT Re-Evaluation 08/21/22    Authorization Type [employer]    PT Start Time 1132    PT Stop Time 1210    PT Time Calculation (min) 38 min    Activity Tolerance Patient tolerated treatment well;Patient limited by pain    Behavior During Therapy WFL for tasks assessed/performed              Past Medical History:  Diagnosis Date   Anemia    Anxiety    Cervicalgia    Chronic migraine with aura    Chronic pain syndrome    Cognitive deficit as late effect of traumatic brain injury ([place])    Depression    H/O cervical fracture    Heart murmur    Menorrhagia    TBI (traumatic brain injury) [facility])    Past Surgical History:  Procedure Laterality Date   APPENDECTOMY     BONE EXCISION Right 02/02/2021   Procedure: OSTEOPHYTE EXCISION;  Surgeon: Altamese [place], MD;  Location: [place];  Service: Orthopedics;  Laterality: Right;   Ceasarean section  2007, 2010,2014   EXTERNAL FIXATION LEG Bilateral 12/14/2012   Procedure: EXTERNAL FIXATION LEG;  Surgeon: [name], MD;  Location: [place];  Service: Orthopedics;  Laterality: Bilateral;   EXTERNAL FIXATION REMOVAL Bilateral 12/23/2012   Procedure: REMOVAL EXTERNAL FIXATION LEG;  Surgeon: [name], MD;  Location: [employer];  Service: Orthopedics;  Laterality: Bilateral;   HARDWARE REMOVAL Right 02/02/2021   Procedure: HARDWARE REMOVAL;  Surgeon: Altamese [place], MD;  Location: [place];  Service: Orthopedics;  Laterality: Right;   I & D EXTREMITY Left 12/14/2012   Procedure: IRRIGATION AND DEBRIDEMENT EXTREMITY;  Surgeon: [name], MD;  Location: [place];  Service: Orthopedics;  Laterality: Left;   ORIF  ANKLE FRACTURE Left 12/14/2012   Procedure: OPEN REDUCTION INTERNAL FIXATION (ORIF) ANKLE FRACTURE;  Surgeon: [name], MD;  Location: [place];  Service: Orthopedics;  Laterality: Left;   ORIF TIBIA FRACTURE Bilateral 12/23/2012   Procedure: OPEN REDUCTION INTERNAL FIXATION (ORIF) BILATERAL DISTAL TIBIA FRACTURE;  Surgeon: [name], MD;  Location: [place];  Service: Orthopedics;  Laterality: Bilateral;   Patient Active Problem List   Diagnosis Date Noted   Fibroids 10/19/2020   History of uterine fibroid 08/12/2020   Menorrhagia with irregular cycle 08/12/2020   Gastroesophageal reflux disease without esophagitis 06/02/2020   Keloid of skin 06/02/2020   Vitamin D deficiency 04/29/2019   Iron deficiency anemia    Iliotibial band syndrome affecting right lower leg 11/05/2018   Therapeutic opioid induced constipation 11/05/2018   Chronic pain syndrome 05/12/2018   Cognitive deficit as late effect of traumatic brain injury ([place]) 08/31/2014   TBI (traumatic brain injury) ([place]) 09/16/2013   Tibial nerve lesion 03/31/2013   Neck mass 02/06/2013   New onset of headaches due to trauma 01/02/2013   Migraine 12/21/2012   Concussion 12/18/2012   Closed C2 fracture ([place]) 12/15/2012   Fracture, sternum closed 12/15/2012   Bilateral ankle fractures 12/15/2012   Pulmonary contusion 12/15/2012    REFERRING DIAG: M54.2 (ICD-10-CM) - Cervicalgia  THERAPY DIAG:  Cervicalgia  Muscle weakness (generalized)  Chronic right shoulder pain  Unsteadiness on feet  Rationale for Evaluation and Treatment Rehabilitation  PERTINENT HISTORY: Past C2 fx, TBI, ankle fx's  PRECAUTIONS: None  SUBJECTIVE: Patient reports that she is hurting today and feels like she has some knots in her upper traps.  PAIN:  Are you having pain?  Yes: NPRS scale: 6/10 (8-9/10 at worst) Pain location: neck, upper traps Pain description: stiffness Aggravating factors: sitting at desk Relieving factors:  none   OBJECTIVE: (objective measures completed at initial evaluation unless otherwise dated)  PATIENT SURVEYS:  NDI: 64% disability - eval NDI: 58% disability - 07/19/2022 NDI: 46 % disability - 08/02/2022   COGNITION: Overall cognitive status: Within functional limits for tasks assessed   SENSATION: WFL   POSTURE: rounded shoulders and forward head   PALPATION: TTP to bilateral upper traps and cervical paraspinals       CERVICAL ROM:    Active ROM A/PROM (deg) eval A/PROM (deg) 07/19/2022  Flexion     Extension     Right lateral flexion     Left lateral flexion     Right rotation 35 38  Left rotation 30 30   (Blank rows = not tested)   UE Myotomes:   Myotome Right Left  C5 5/5 5/5  C6 5/5 5/5  C7 5/5 5/5  C8 5/5 5/5  T1 5/5 5/5    UPPER EXTREMITY ROM:   Active ROM Right eval Left eval  Shoulder flexion [facility]  Shoulder extension      Shoulder abduction [facility]  Shoulder adduction      Shoulder extension      Shoulder internal rotation      Shoulder external rotation      Elbow flexion      Elbow extension       (Blank rows = not tested)   UPPER EXTREMITY MMT:   MMT Right eval Left eval  Shoulder flexion [facility]  Shoulder extension      Shoulder abduction [facility]  Shoulder adduction      Shoulder extension      Shoulder internal rotation      Shoulder external rotation      Middle trapezius 3+/5 3+/5  Lower trapezius 3+/5 3+/5  Elbow flexion      Elbow extension      Wrist flexion      Wrist extension      Wrist ulnar deviation      Wrist radial deviation      Wrist pronation      Wrist supination      Grip strength       (Blank rows = not tested)   FUNCTIONAL TESTS:  Cervical flexor endurance test: 25 sec     TODAY'S TREATMENT:  [facility] Adult PT Treatment:                                                DATE: 08/14/2022 Therapeutic Exercise: UBE lvl 1.5 x 2/2 min while taking subjective  Seated high/low row 25# 3x10  each Seated bilateral ER GTB 2x15 Seated horizontal abd 2x15 GTB Supine on foam roller 2x15 each: protraction/retraction, alternating UE flexion/extension Manual Therapy: Suboccipital release Positional release BIL upper traps STM to cervical paraspinals  [facility] Adult PT Treatment:  DATE: 08/09/2022 Therapeutic Exercise: UBE lvl 1.0 x 4 min while taking subjective  Seated high/low row 25# 3x10 each Seated bilateral ER GTB 2x15 Seated horizontal abd  back 2x15 GTB S/L open book x 15 each Qped thread the needle with soft foam x 10 each Cat/cow x15 Manual Therapy: Suboccipital release STM to cervical paraspinals   [facility] Adult PT Treatment:                                                DATE: 08/06/2022 Therapeutic Exercise: UBE lvl 1.0 x 4 min while taking subjective  Seated high/low row 25# 3x10 each Seated bilateral ER GTB 2x15 Supine horizontal abd  back against wall 2x15 GTB S/L open book x 15 each Qped thread the needle with soft foam x 10 each Standing chin tuck against wall x 10 - 5" hold Thoracic ext stretch up wall with soft foam x 10 Modalities: MHP to cervical paraspinals x 8 min in sitting post session Trigger Point Dry Needling Treatment: Pre-treatment instruction: Patient instructed on dry needling rationale, procedures, and possible side effects including pain during treatment (achy,cramping feeling), bruising, drop of blood, lightheadedness, nausea, sweating. Patient Consent Given: No Education handout provided: Yes Muscles treated: bilateral upper traps  Needle size and number: .30x80m x 4 Electrical stimulation performed: No Parameters: N/A Treatment response/outcome: Palpable decrease in muscle tension Post-treatment instructions: Patient instructed to expect possible mild to moderate muscle soreness later today and/or tomorrow. Patient instructed in methods to reduce muscle soreness and to continue prescribed HEP.  If patient was dry needled over the lung field, patient was instructed on signs and symptoms of pneumothorax and, however unlikely, to see immediate medical attention should they occur. Patient was also educated on signs and symptoms of infection and to seek medical attention should they occur. Patient verbalized understanding of these instructions and education.      PATIENT EDUCATION:  Education details: continue HEP Person educated: Patient Education method: Explanation, Demonstration, and Handouts Education comprehension: verbalized understanding and returned demonstration     HOME EXERCISE PROGRAM: Access Code: [email or web address]: [email or web address] Date: 06/26/2022 Prepared by: DOctavio Manns  Exercises - Median Nerve Tensioner  - 1 x daily - 7 x weekly - 2 sets - 10 reps - Standing Shoulder Row with Anchored Resistance  - 1 x daily - 7 x weekly - 3 sets - 10 reps - green  hold - Supine Chin Tuck  - 1 x daily - 7 x weekly - 2 sets - 10 reps - 3 sec hold   ASSESSMENT:   CLINICAL IMPRESSION: Patient presents to PT with continued reports of pain in her neck and shoulders and reports she is interested in TPDN with e-stim next session. Session today continued to focus on periscapular strengthening and use of manual techniques to decrease pain and tension. Patient was able to tolerate all prescribed exercises with no adverse effects. Patient continues to benefit from skilled PT services and should be progressed as able to improve functional independence.    OBJECTIVE IMPAIRMENTS decreased activity tolerance, decreased mobility, decreased ROM, decreased strength, postural dysfunction, and pain.    ACTIVITY LIMITATIONS carrying, lifting, reach over head, and caring for others   PARTICIPATION LIMITATIONS: driving, community activity, and occupation   PERSONAL FACTORS Time since onset of injury/illness/exacerbation and 1-2 comorbidities: Past C2 fx, TBI, ankle fx's  are also  affecting patient's functional outcome.      GOALS: Goals reviewed with patient? No   SHORT TERM GOALS: Target date: 07/17/2022    Pt will be compliant and knowledgeable with initial HEP for improved comfort and carryover Baseline: initial HEP given  Goal status: MET   2.  Pt will self report neck pain no greater than 5/10 for improved comfort and functional ability Baseline: 8/10 at worst 07/19/2022: 6/10 at worst Goal status: ONGOING   LONG TERM GOALS: Target date: 08/21/2022   Pt will improve bilateral cervical rotation to at least 60 deg for improved functional ability with driving Baseline: see chart 07/19/2022: see chart Goal status: ONGOING   2.  Pt will decrease NDI disability score to no greater than 40% for improved subjective functional ability with work and community activities Baseline: 64% disability Goal status: Ongoing 08/02/22: 46%   3.  Pt will improve cervical flexor endurance test hold time to no less than 45 sec for improved postural endurance and decrease neck pain Baseline: 24 sec 07/19/2022: 25 sec Goal status: ONGOING   4.  Pt will self report neck pain no greater than 1-2/10 for improved comfort and functional ability Baseline: 8/10 at worst 07/19/2022: 6/10 at worst Goal status: ONGOING     PLAN: PT FREQUENCY: 2x/week   PT DURATION: 6 weeks   PLANNED INTERVENTIONS: Therapeutic exercises, Therapeutic activity, Neuromuscular re-education, Balance training, Gait training, Patient/Family education, Self Care, Joint mobilization, Dry Needling, Electrical stimulation, Cryotherapy, Moist heat, Manual therapy, and Re-evaluation   PLAN FOR NEXT SESSION: assess HEP response, progress DNF and periscapular strength, manual and TPDN to neck and upper traps    Margarette Canada, PTA 08/14/2022, 12:11 PM

## 2022-08-16 ENCOUNTER — Ambulatory Visit: Payer: Medicaid Other

## 2022-08-16 NOTE — Therapy (Incomplete)
OUTPATIENT PHYSICAL THERAPY TREATMENT NOTE   Patient Name: Monique [name] MRN: [id number] DOB:04-20-1986, 36 y.o., female Today's Date: 08/16/2022  PCP: Ladell Pier, MD REFERRING PROVIDER: Izora Ribas, MD  END OF SESSION:      Past Medical History:  Diagnosis Date   Anemia    Anxiety    Cervicalgia    Chronic migraine with aura    Chronic pain syndrome    Cognitive deficit as late effect of traumatic brain injury ([place])    Depression    H/O cervical fracture    Heart murmur    Menorrhagia    TBI (traumatic brain injury) [facility])    Past Surgical History:  Procedure Laterality Date   APPENDECTOMY     BONE EXCISION Right 02/02/2021   Procedure: OSTEOPHYTE EXCISION;  Surgeon: Altamese [place], MD;  Location: [place];  Service: Orthopedics;  Laterality: Right;   Ceasarean section  2007, 2010,2014   EXTERNAL FIXATION LEG Bilateral 12/14/2012   Procedure: EXTERNAL FIXATION LEG;  Surgeon: [name], MD;  Location: [place];  Service: Orthopedics;  Laterality: Bilateral;   EXTERNAL FIXATION REMOVAL Bilateral 12/23/2012   Procedure: REMOVAL EXTERNAL FIXATION LEG;  Surgeon: [name], MD;  Location: [place];  Service: Orthopedics;  Laterality: Bilateral;   HARDWARE REMOVAL Right 02/02/2021   Procedure: HARDWARE REMOVAL;  Surgeon: Altamese [place], MD;  Location: [place];  Service: Orthopedics;  Laterality: Right;   I & D EXTREMITY Left 12/14/2012   Procedure: IRRIGATION AND DEBRIDEMENT EXTREMITY;  Surgeon: [name], MD;  Location: [place];  Service: Orthopedics;  Laterality: Left;   ORIF ANKLE FRACTURE Left 12/14/2012   Procedure: OPEN REDUCTION INTERNAL FIXATION (ORIF) ANKLE FRACTURE;  Surgeon: [name], MD;  Location: [place];  Service: Orthopedics;  Laterality: Left;   ORIF TIBIA FRACTURE Bilateral 12/23/2012   Procedure: OPEN REDUCTION INTERNAL FIXATION (ORIF) BILATERAL DISTAL TIBIA FRACTURE;  Surgeon: [name], MD;  Location: [place];  Service: Orthopedics;   Laterality: Bilateral;   Patient Active Problem List   Diagnosis Date Noted   Fibroids 10/19/2020   History of uterine fibroid 08/12/2020   Menorrhagia with irregular cycle 08/12/2020   Gastroesophageal reflux disease without esophagitis 06/02/2020   Keloid of skin 06/02/2020   Vitamin D deficiency 04/29/2019   Iron deficiency anemia    Iliotibial band syndrome affecting right lower leg 11/05/2018   Therapeutic opioid induced constipation 11/05/2018   Chronic pain syndrome 05/12/2018   Cognitive deficit as late effect of traumatic brain injury ([place]) 08/31/2014   TBI (traumatic brain injury) ([place]) 09/16/2013   Tibial nerve lesion 03/31/2013   Neck mass 02/06/2013   New onset of headaches due to trauma 01/02/2013   Migraine 12/21/2012   Concussion 12/18/2012   Closed C2 fracture ([place]) 12/15/2012   Fracture, sternum closed 12/15/2012   Bilateral ankle fractures 12/15/2012   Pulmonary contusion 12/15/2012    REFERRING DIAG: M54.2 (ICD-10-CM) - Cervicalgia  THERAPY DIAG:  No diagnosis found.  Rationale for Evaluation and Treatment Rehabilitation  PERTINENT HISTORY: Past C2 fx, TBI, ankle fx's  PRECAUTIONS: None  SUBJECTIVE: ***  PAIN:  Are you having pain?  Yes: NPRS scale: 6/10 (8-9/10 at worst) Pain location: neck, upper traps Pain description: stiffness Aggravating factors: sitting at desk Relieving factors: none   OBJECTIVE: (objective measures completed at initial evaluation unless otherwise dated)  PATIENT SURVEYS:  NDI: 64% disability - eval NDI: 58% disability - 07/19/2022 NDI: 46 % disability - 08/02/2022   COGNITION:  Overall cognitive status: Within functional limits for tasks assessed   SENSATION: WFL   POSTURE: rounded shoulders and forward head   PALPATION: TTP to bilateral upper traps and cervical paraspinals       CERVICAL ROM:    Active ROM A/PROM (deg) eval A/PROM (deg) 07/19/2022  Flexion     Extension     Right lateral flexion      Left lateral flexion     Right rotation 35 38  Left rotation 30 30   (Blank rows = not tested)   UE Myotomes:   Myotome Right Left  C5 5/5 5/5  C6 5/5 5/5  C7 5/5 5/5  C8 5/5 5/5  T1 5/5 5/5    UPPER EXTREMITY ROM:   Active ROM Right eval Left eval  Shoulder flexion [facility]  Shoulder extension      Shoulder abduction [facility]  Shoulder adduction      Shoulder extension      Shoulder internal rotation      Shoulder external rotation      Elbow flexion      Elbow extension       (Blank rows = not tested)   UPPER EXTREMITY MMT:   MMT Right eval Left eval  Shoulder flexion [facility]  Shoulder extension      Shoulder abduction [facility]  Shoulder adduction      Shoulder extension      Shoulder internal rotation      Shoulder external rotation      Middle trapezius 3+/5 3+/5  Lower trapezius 3+/5 3+/5  Elbow flexion      Elbow extension      Wrist flexion      Wrist extension      Wrist ulnar deviation      Wrist radial deviation      Wrist pronation      Wrist supination      Grip strength       (Blank rows = not tested)   FUNCTIONAL TESTS:  Cervical flexor endurance test: 25 sec     TODAY'S TREATMENT:  [facility] Adult PT Treatment:                                                DATE: 08/16/2022 Therapeutic Exercise: UBE lvl 1.5 x 2/2 min while taking subjective  Seated high/low row 25# 3x10 each Seated bilateral ER GTB 2x15 Seated horizontal abd 2x15 GTB Supine on foam roller 2x15 each: protraction/retraction, alternating UE flexion/extension Manual Therapy: Suboccipital release Positional release BIL upper traps STM to cervical paraspinals  [facility] Adult PT Treatment:                                                DATE: 08/14/2022 Therapeutic Exercise: UBE lvl 1.5 x 2/2 min while taking subjective  Seated high/low row 25# 3x10 each Seated bilateral ER GTB 2x15 Seated horizontal abd 2x15 GTB Supine on foam roller 2x15 each: protraction/retraction,  alternating UE flexion/extension Manual Therapy: Suboccipital release Positional release BIL upper traps STM to cervical paraspinals  [facility] Adult PT Treatment:  DATE: 08/09/2022 Therapeutic Exercise: UBE lvl 1.0 x 4 min while taking subjective  Seated high/low row 25# 3x10 each Seated bilateral ER GTB 2x15 Seated horizontal abd  back 2x15 GTB S/L open book x 15 each Qped thread the needle with soft foam x 10 each Cat/cow x15 Manual Therapy: Suboccipital release STM to cervical paraspinals   [facility] Adult PT Treatment:                                                DATE: 08/06/2022 Therapeutic Exercise: UBE lvl 1.0 x 4 min while taking subjective  Seated high/low row 25# 3x10 each Seated bilateral ER GTB 2x15 Supine horizontal abd  back against wall 2x15 GTB S/L open book x 15 each Qped thread the needle with soft foam x 10 each Standing chin tuck against wall x 10 - 5" hold Thoracic ext stretch up wall with soft foam x 10 Modalities: MHP to cervical paraspinals x 8 min in sitting post session Trigger Point Dry Needling Treatment: Pre-treatment instruction: Patient instructed on dry needling rationale, procedures, and possible side effects including pain during treatment (achy,cramping feeling), bruising, drop of blood, lightheadedness, nausea, sweating. Patient Consent Given: No Education handout provided: Yes Muscles treated: bilateral upper traps  Needle size and number: .30x8m x 4 Electrical stimulation performed: No Parameters: N/A Treatment response/outcome: Palpable decrease in muscle tension Post-treatment instructions: Patient instructed to expect possible mild to moderate muscle soreness later today and/or tomorrow. Patient instructed in methods to reduce muscle soreness and to continue prescribed HEP. If patient was dry needled over the lung field, patient was instructed on signs and symptoms of pneumothorax and, however  unlikely, to see immediate medical attention should they occur. Patient was also educated on signs and symptoms of infection and to seek medical attention should they occur. Patient verbalized understanding of these instructions and education.      PATIENT EDUCATION:  Education details: continue HEP Person educated: Patient Education method: Explanation, Demonstration, and Handouts Education comprehension: verbalized understanding and returned demonstration     HOME EXERCISE PROGRAM: Access Code: [email or web address]: [email or web address] Date: 06/26/2022 Prepared by: DOctavio Manns  Exercises - Median Nerve Tensioner  - 1 x daily - 7 x weekly - 2 sets - 10 reps - Standing Shoulder Row with Anchored Resistance  - 1 x daily - 7 x weekly - 3 sets - 10 reps - green  hold - Supine Chin Tuck  - 1 x daily - 7 x weekly - 2 sets - 10 reps - 3 sec hold   ASSESSMENT:   CLINICAL IMPRESSION: ***    OBJECTIVE IMPAIRMENTS decreased activity tolerance, decreased mobility, decreased ROM, decreased strength, postural dysfunction, and pain.    ACTIVITY LIMITATIONS carrying, lifting, reach over head, and caring for others   PARTICIPATION LIMITATIONS: driving, community activity, and occupation   PERSONAL FACTORS Time since onset of injury/illness/exacerbation and 1-2 comorbidities: Past C2 fx, TBI, ankle fx's  are also affecting patient's functional outcome.      GOALS: Goals reviewed with patient? No   SHORT TERM GOALS: Target date: 07/17/2022    Pt will be compliant and knowledgeable with initial HEP for improved comfort and carryover Baseline: initial HEP given  Goal status: MET   2.  Pt will self report neck pain no greater than 5/10 for improved comfort and functional ability Baseline: 8/10  at worst 07/19/2022: 6/10 at worst Goal status: ONGOING   LONG TERM GOALS: Target date: 08/21/2022   Pt will improve bilateral cervical rotation to at least 60 deg for improved  functional ability with driving Baseline: see chart 07/19/2022: see chart Goal status: ONGOING   2.  Pt will decrease NDI disability score to no greater than 40% for improved subjective functional ability with work and community activities Baseline: 64% disability Goal status: Ongoing 08/02/22: 46%   3.  Pt will improve cervical flexor endurance test hold time to no less than 45 sec for improved postural endurance and decrease neck pain Baseline: 24 sec 07/19/2022: 25 sec Goal status: ONGOING   4.  Pt will self report neck pain no greater than 1-2/10 for improved comfort and functional ability Baseline: 8/10 at worst 07/19/2022: 6/10 at worst Goal status: ONGOING     PLAN: PT FREQUENCY: 2x/week   PT DURATION: 6 weeks   PLANNED INTERVENTIONS: Therapeutic exercises, Therapeutic activity, Neuromuscular re-education, Balance training, Gait training, Patient/Family education, Self Care, Joint mobilization, Dry Needling, Electrical stimulation, Cryotherapy, Moist heat, Manual therapy, and Re-evaluation   PLAN FOR NEXT SESSION: assess HEP response, progress DNF and periscapular strength, manual and TPDN to neck and upper traps    Ward Chatters, PT 08/16/2022, 1:34 PM

## 2022-08-20 ENCOUNTER — Ambulatory Visit: Payer: Medicaid Other

## 2022-08-20 DIAGNOSIS — M542 Cervicalgia: Secondary | ICD-10-CM | POA: Diagnosis not present

## 2022-08-20 DIAGNOSIS — M6281 Muscle weakness (generalized): Secondary | ICD-10-CM

## 2022-08-20 NOTE — Therapy (Signed)
OUTPATIENT PHYSICAL THERAPY TREATMENT NOTE   Patient Name: Monique [name] MRN: [id number] DOB:1986/01/14, 36 y.o., female Today's Date: 08/21/2022  PCP: Marcine Matar, MD REFERRING PROVIDER: Horton Chin, MD  END OF SESSION:   PT End of Session - 08/21/22 0745     Visit Number 10    Number of Visits 17    Date for PT Re-Evaluation 08/21/22    Authorization Type [employer]    PT Start Time 1714   arrived late   PT Stop Time 1745    PT Time Calculation (min) 31 min    Activity Tolerance Patient tolerated treatment well;Patient limited by pain    Behavior During Therapy WFL for tasks assessed/performed               Past Medical History:  Diagnosis Date   Anemia    Anxiety    Cervicalgia    Chronic migraine with aura    Chronic pain syndrome    Cognitive deficit as late effect of traumatic brain injury (HCC)    Depression    H/O cervical fracture    Heart murmur    Menorrhagia    TBI (traumatic brain injury) [facility])    Past Surgical History:  Procedure Laterality Date   APPENDECTOMY     BONE EXCISION Right 02/02/2021   Procedure: OSTEOPHYTE EXCISION;  Surgeon: Myrene Galas, MD;  Location: [facility] OR;  Service: Orthopedics;  Laterality: Right;   Ceasarean section  2007, 2010,2014   EXTERNAL FIXATION LEG Bilateral 12/14/2012   Procedure: EXTERNAL FIXATION LEG;  Surgeon: Shelda Pal, MD;  Location: [facility] OR;  Service: Orthopedics;  Laterality: Bilateral;   EXTERNAL FIXATION REMOVAL Bilateral 12/23/2012   Procedure: REMOVAL EXTERNAL FIXATION LEG;  Surgeon: Budd Palmer, MD;  Location: [facility] OR;  Service: Orthopedics;  Laterality: Bilateral;   HARDWARE REMOVAL Right 02/02/2021   Procedure: HARDWARE REMOVAL;  Surgeon: Myrene Galas, MD;  Location: [facility] OR;  Service: Orthopedics;  Laterality: Right;   I & D EXTREMITY Left 12/14/2012   Procedure: IRRIGATION AND DEBRIDEMENT EXTREMITY;  Surgeon: Shelda Pal, MD;  Location: [facility] OR;  Service: Orthopedics;   Laterality: Left;   ORIF ANKLE FRACTURE Left 12/14/2012   Procedure: OPEN REDUCTION INTERNAL FIXATION (ORIF) ANKLE FRACTURE;  Surgeon: Shelda Pal, MD;  Location: [facility] OR;  Service: Orthopedics;  Laterality: Left;   ORIF TIBIA FRACTURE Bilateral 12/23/2012   Procedure: OPEN REDUCTION INTERNAL FIXATION (ORIF) BILATERAL DISTAL TIBIA FRACTURE;  Surgeon: Budd Palmer, MD;  Location: [facility] OR;  Service: Orthopedics;  Laterality: Bilateral;   Patient Active Problem List   Diagnosis Date Noted   Fibroids 10/19/2020   History of uterine fibroid 08/12/2020   Menorrhagia with irregular cycle 08/12/2020   Gastroesophageal reflux disease without esophagitis 06/02/2020   Keloid of skin 06/02/2020   Vitamin D deficiency 04/29/2019   Iron deficiency anemia    Iliotibial band syndrome affecting right lower leg 11/05/2018   Therapeutic opioid induced constipation 11/05/2018   Chronic pain syndrome 05/12/2018   Cognitive deficit as late effect of traumatic brain injury (HCC) 08/31/2014   TBI (traumatic brain injury) (HCC) 09/16/2013   Tibial nerve lesion 03/31/2013   Neck mass 02/06/2013   New onset of headaches due to trauma 01/02/2013   Migraine 12/21/2012   Concussion 12/18/2012   Closed C2 fracture (HCC) 12/15/2012   Fracture, sternum closed 12/15/2012   Bilateral ankle fractures 12/15/2012   Pulmonary contusion 12/15/2012    REFERRING DIAG: M54.2 (ICD-10-CM) - Cervicalgia  THERAPY DIAG:  Cervicalgia  Muscle weakness (generalized)  Rationale for Evaluation and Treatment Rehabilitation  PERTINENT HISTORY: Past C2 fx, TBI, ankle fx's  PRECAUTIONS: None  SUBJECTIVE: Pt presents to PT with reports of continued neck pain and tightness. Has been compliant with HEP with no adverse effect. Pt is ready to begin PT at this time.   PAIN:  Are you having pain?  Yes: NPRS scale: 6/10 (8-9/10 at worst) Pain location: neck, upper traps Pain description: stiffness Aggravating factors: sitting at  desk Relieving factors: none   OBJECTIVE: (objective measures completed at initial evaluation unless otherwise dated)  PATIENT SURVEYS:  NDI: 64% disability - eval NDI: 58% disability - 07/19/2022 NDI: 46 % disability - 08/02/2022   COGNITION: Overall cognitive status: Within functional limits for tasks assessed   SENSATION: WFL   POSTURE: rounded shoulders and forward head   PALPATION: TTP to bilateral upper traps and cervical paraspinals       CERVICAL ROM:    Active ROM A/PROM (deg) eval A/PROM (deg) 07/19/2022  Flexion     Extension     Right lateral flexion     Left lateral flexion     Right rotation 35 38  Left rotation 30 30   (Blank rows = not tested)   UE Myotomes:   Myotome Right Left  C5 5/5 5/5  C6 5/5 5/5  C7 5/5 5/5  C8 5/5 5/5  T1 5/5 5/5    UPPER EXTREMITY ROM:   Active ROM Right eval Left eval  Shoulder flexion [facility]  Shoulder extension      Shoulder abduction [facility]  Shoulder adduction      Shoulder extension      Shoulder internal rotation      Shoulder external rotation      Elbow flexion      Elbow extension       (Blank rows = not tested)   UPPER EXTREMITY MMT:   MMT Right eval Left eval  Shoulder flexion [facility]  Shoulder extension      Shoulder abduction [facility]  Shoulder adduction      Shoulder extension      Shoulder internal rotation      Shoulder external rotation      Middle trapezius 3+/5 3+/5  Lower trapezius 3+/5 3+/5  Elbow flexion      Elbow extension      Wrist flexion      Wrist extension      Wrist ulnar deviation      Wrist radial deviation      Wrist pronation      Wrist supination      Grip strength       (Blank rows = not tested)   FUNCTIONAL TESTS:  Cervical flexor endurance test: 25 sec     TODAY'S TREATMENT:  [facility] Adult PT Treatment:                                                DATE: 08/20/2022 Therapeutic Exercise: UBE lvl 1.5 x 2/2 min while taking subjective  Seated  high/low row 30# 3x10 each Manual Therapy: STM to cervical paraspinals and upper traps Trigger Point Dry-Needling:  Treatment instructions: Expect mild to moderate muscle soreness. S/S of pneumothorax if dry needled over a lung field, and to seek immediate medical attention should they  occur. Patient verbalized understanding of these instructions and education.  Patient Consent Given: Yes Education handout provided: No Muscles treated: bilateral upper traps, thoracic paraspinals, cervical paraspinals, splenius capitus  Electrical stimulation performed: No Parameters: N/A Treatment response/outcome: twitch response, decrease in muscle tone   [facility] Adult PT Treatment:                                                DATE: 08/14/2022 Therapeutic Exercise: UBE lvl 1.5 x 2/2 min while taking subjective  Seated high/low row 25# 3x10 each Seated bilateral ER GTB 2x15 Seated horizontal abd 2x15 GTB Supine on foam roller 2x15 each: protraction/retraction, alternating UE flexion/extension Manual Therapy: Suboccipital release Positional release BIL upper traps STM to cervical paraspinals  [facility] Adult PT Treatment:                                                DATE: 08/09/2022 Therapeutic Exercise: UBE lvl 1.0 x 4 min while taking subjective  Seated high/low row 25# 3x10 each Seated bilateral ER GTB 2x15 Seated horizontal abd  back 2x15 GTB S/L open book x 15 each Qped thread the needle with soft foam x 10 each Cat/cow x15 Manual Therapy: Suboccipital release STM to cervical paraspinals   [facility] Adult PT Treatment:                                                DATE: 08/06/2022 Therapeutic Exercise: UBE lvl 1.0 x 4 min while taking subjective  Seated high/low row 25# 3x10 each Seated bilateral ER GTB 2x15 Supine horizontal abd  back against wall 2x15 GTB S/L open book x 15 each Qped thread the needle with soft foam x 10 each Standing chin tuck against wall x 10 - 5" hold Thoracic ext  stretch up wall with soft foam x 10 Modalities: MHP to cervical paraspinals x 8 min in sitting post session Trigger Point Dry Needling Treatment: Pre-treatment instruction: Patient instructed on dry needling rationale, procedures, and possible side effects including pain during treatment (achy,cramping feeling), bruising, drop of blood, lightheadedness, nausea, sweating. Patient Consent Given: No Education handout provided: Yes Muscles treated: bilateral upper traps  Needle size and number: .30x99mm x 4 Electrical stimulation performed: No Parameters: N/A Treatment response/outcome: Palpable decrease in muscle tension Post-treatment instructions: Patient instructed to expect possible mild to moderate muscle soreness later today and/or tomorrow. Patient instructed in methods to reduce muscle soreness and to continue prescribed HEP. If patient was dry needled over the lung field, patient was instructed on signs and symptoms of pneumothorax and, however unlikely, to see immediate medical attention should they occur. Patient was also educated on signs and symptoms of infection and to seek medical attention should they occur. Patient verbalized understanding of these instructions and education.      PATIENT EDUCATION:  Education details: continue HEP Person educated: Patient Education method: Explanation, Demonstration, and Handouts Education comprehension: verbalized understanding and returned demonstration     HOME EXERCISE PROGRAM: Access Code: [email or web address] URL: [email or web address] Date: 06/26/2022 Prepared by: Edwinna Areola   Exercises - Median Nerve Tensioner  -  1 x daily - 7 x weekly - 2 sets - 10 reps - Standing Shoulder Row with Anchored Resistance  - 1 x daily - 7 x weekly - 3 sets - 10 reps - green  hold - Supine Chin Tuck  - 1 x daily - 7 x weekly - 2 sets - 10 reps - 3 sec hold   ASSESSMENT:   CLINICAL IMPRESSION: Pt was able to complete prescribed exercises,  responded well to TPDN and manual therapy interventions. Noted decreased pain post session. Continues to benefit from skilled PT, will continue to progress as able per POC.    OBJECTIVE IMPAIRMENTS decreased activity tolerance, decreased mobility, decreased ROM, decreased strength, postural dysfunction, and pain.    ACTIVITY LIMITATIONS carrying, lifting, reach over head, and caring for others   PARTICIPATION LIMITATIONS: driving, community activity, and occupation   PERSONAL FACTORS Time since onset of injury/illness/exacerbation and 1-2 comorbidities: Past C2 fx, TBI, ankle fx's  are also affecting patient's functional outcome.      GOALS: Goals reviewed with patient? No   SHORT TERM GOALS: Target date: 07/17/2022    Pt will be compliant and knowledgeable with initial HEP for improved comfort and carryover Baseline: initial HEP given  Goal status: MET   2.  Pt will self report neck pain no greater than 5/10 for improved comfort and functional ability Baseline: 8/10 at worst 07/19/2022: 6/10 at worst Goal status: ONGOING   LONG TERM GOALS: Target date: 08/21/2022   Pt will improve bilateral cervical rotation to at least 60 deg for improved functional ability with driving Baseline: see chart 07/19/2022: see chart Goal status: ONGOING   2.  Pt will decrease NDI disability score to no greater than 40% for improved subjective functional ability with work and community activities Baseline: 64% disability Goal status: Ongoing 08/02/22: 46%   3.  Pt will improve cervical flexor endurance test hold time to no less than 45 sec for improved postural endurance and decrease neck pain Baseline: 24 sec 07/19/2022: 25 sec Goal status: ONGOING   4.  Pt will self report neck pain no greater than 1-2/10 for improved comfort and functional ability Baseline: 8/10 at worst 07/19/2022: 6/10 at worst Goal status: ONGOING     PLAN: PT FREQUENCY: 2x/week   PT DURATION: 6 weeks   PLANNED  INTERVENTIONS: Therapeutic exercises, Therapeutic activity, Neuromuscular re-education, Balance training, Gait training, Patient/Family education, Self Care, Joint mobilization, Dry Needling, Electrical stimulation, Cryotherapy, Moist heat, Manual therapy, and Re-evaluation   PLAN FOR NEXT SESSION: assess HEP response, progress DNF and periscapular strength, manual and TPDN to neck and upper traps    Eloy End, PT 08/21/2022, 7:56 AM

## 2022-08-21 ENCOUNTER — Ambulatory Visit: Payer: Medicaid Other

## 2022-08-21 DIAGNOSIS — M542 Cervicalgia: Secondary | ICD-10-CM

## 2022-08-21 DIAGNOSIS — M6281 Muscle weakness (generalized): Secondary | ICD-10-CM

## 2022-08-21 NOTE — Therapy (Signed)
OUTPATIENT PHYSICAL THERAPY TREATMENT NOTE   Patient Name: Monique [name] MRN: [id number] DOB:[birth year], 36 y.o., female Today's Date: 08/21/2022  PCP: Ladell Pier, MD REFERRING PROVIDER: Izora Ribas, MD  END OF SESSION:   PT End of Session - 08/21/22 1120     Visit Number 11    Number of Visits 17    Date for PT Re-Evaluation 09/18/22    Authorization Type [employer]    PT Start Time 1130    PT Stop Time 1208    PT Time Calculation (min) 38 min    Activity Tolerance Patient tolerated treatment well;Patient limited by pain    Behavior During Therapy WFL for tasks assessed/performed                Past Medical History:  Diagnosis Date   Anemia    Anxiety    Cervicalgia    Chronic migraine with aura    Chronic pain syndrome    Cognitive deficit as late effect of traumatic brain injury ([place])    Depression    H/O cervical fracture    Heart murmur    Menorrhagia    TBI (traumatic brain injury) [facility])    Past Surgical History:  Procedure Laterality Date   APPENDECTOMY     BONE EXCISION Right 02/02/2021   Procedure: OSTEOPHYTE EXCISION;  Surgeon: Altamese [place], MD;  Location: [place];  Service: Orthopedics;  Laterality: Right;   Ceasarean section  2007, 2010,2014   EXTERNAL FIXATION LEG Bilateral 12/14/2012   Procedure: EXTERNAL FIXATION LEG;  Surgeon: [name], MD;  Location: [place];  Service: Orthopedics;  Laterality: Bilateral;   EXTERNAL FIXATION REMOVAL Bilateral 12/23/2012   Procedure: REMOVAL EXTERNAL FIXATION LEG;  Surgeon: [name], MD;  Location: [place];  Service: Orthopedics;  Laterality: Bilateral;   HARDWARE REMOVAL Right 02/02/2021   Procedure: HARDWARE REMOVAL;  Surgeon: Altamese Goldonna, MD;  Location: [place];  Service: Orthopedics;  Laterality: Right;   I & D EXTREMITY Left 12/14/2012   Procedure: IRRIGATION AND DEBRIDEMENT EXTREMITY;  Surgeon: [name], MD;  Location: [place];  Service: Orthopedics;  Laterality: Left;    ORIF ANKLE FRACTURE Left 12/14/2012   Procedure: OPEN REDUCTION INTERNAL FIXATION (ORIF) ANKLE FRACTURE;  Surgeon: [name], MD;  Location: [place];  Service: Orthopedics;  Laterality: Left;   ORIF TIBIA FRACTURE Bilateral 12/23/2012   Procedure: OPEN REDUCTION INTERNAL FIXATION (ORIF) BILATERAL DISTAL TIBIA FRACTURE;  Surgeon: [name], MD;  Location: [place];  Service: Orthopedics;  Laterality: Bilateral;   Patient Active Problem List   Diagnosis Date Noted   Fibroids 10/19/2020   History of uterine fibroid 08/12/2020   Menorrhagia with irregular cycle 08/12/2020   Gastroesophageal reflux disease without esophagitis 06/02/2020   Keloid of skin 06/02/2020   Vitamin D deficiency 04/29/2019   Iron deficiency anemia    Iliotibial band syndrome affecting right lower leg 11/05/2018   Therapeutic opioid induced constipation 11/05/2018   Chronic pain syndrome 05/12/2018   Cognitive deficit as late effect of traumatic brain injury ([place]) 08/31/2014   TBI (traumatic brain injury) ([place]) 09/16/2013   Tibial nerve lesion 03/31/2013   Neck mass 02/06/2013   New onset of headaches due to trauma 01/02/2013   Migraine 12/21/2012   Concussion 12/18/2012   Closed C2 fracture ([place]) 12/15/2012   Fracture, sternum closed 12/15/2012   Bilateral ankle fractures 12/15/2012   Pulmonary contusion 12/15/2012    REFERRING DIAG: M54.2 (ICD-10-CM) - Cervicalgia  THERAPY  DIAG:  Cervicalgia - Plan: PT plan of care cert/re-cert  Muscle weakness (generalized) - Plan: PT plan of care cert/re-cert  Rationale for Evaluation and Treatment Rehabilitation  PERTINENT HISTORY: Past C2 fx, TBI, ankle fx's  PRECAUTIONS: None  SUBJECTIVE: Pt presents to PT with continued reports of neck pain and tightness. Is ready to begin PT at this time.   PAIN:  Are you having pain?  Yes: NPRS scale: 6/10 (8-9/10 at worst) Pain location: neck, upper traps Pain description: stiffness Aggravating factors: sitting at  desk Relieving factors: none   OBJECTIVE: (objective measures completed at initial evaluation unless otherwise dated)  PATIENT SURVEYS:  NDI: 64% disability - eval NDI: 58% disability - 07/19/2022 NDI: 46 % disability - 08/02/2022   COGNITION: Overall cognitive status: Within functional limits for tasks assessed   SENSATION: WFL   POSTURE: rounded shoulders and forward head   PALPATION: TTP to bilateral upper traps and cervical paraspinals       CERVICAL ROM:    Active ROM A/PROM (deg) eval A/PROM (deg) 07/19/2022  Flexion     Extension     Right lateral flexion     Left lateral flexion     Right rotation 35 38  Left rotation 30 30   (Blank rows = not tested)   UE Myotomes:   Myotome Right Left  C5 5/5 5/5  C6 5/5 5/5  C7 5/5 5/5  C8 5/5 5/5  T1 5/5 5/5    UPPER EXTREMITY ROM:   Active ROM Right eval Left eval  Shoulder flexion [facility]  Shoulder extension      Shoulder abduction [facility]  Shoulder adduction      Shoulder extension      Shoulder internal rotation      Shoulder external rotation      Elbow flexion      Elbow extension       (Blank rows = not tested)   UPPER EXTREMITY MMT:   MMT Right eval Left eval  Shoulder flexion [facility]  Shoulder extension      Shoulder abduction [facility]  Shoulder adduction      Shoulder extension      Shoulder internal rotation      Shoulder external rotation      Middle trapezius 3+/5 3+/5  Lower trapezius 3+/5 3+/5  Elbow flexion      Elbow extension      Wrist flexion      Wrist extension      Wrist ulnar deviation      Wrist radial deviation      Wrist pronation      Wrist supination      Grip strength       (Blank rows = not tested)   FUNCTIONAL TESTS:  Cervical flexor endurance test: 25 sec     TODAY'S TREATMENT:  [facility] Adult PT Treatment:                                                DATE: 08/21/2022 Therapeutic Exercise: UBE lvl 1.5 x 2/2 min while taking subjective  Standing row  2x10 20# Standing extension 2x10 20# Seated bilateral ER GTB 2x15 Supine horizontal abd 2x15 GTB Manual Therapy: Suboccipital release Positional release BIL upper traps STM to cervical paraspinals  [facility] Adult PT Treatment:  DATE: 08/20/2022 Therapeutic Exercise: UBE lvl 1.5 x 2/2 min while taking subjective  Seated high/low row 30# 3x10 each Manual Therapy: STM to cervical paraspinals and upper traps Trigger Point Dry-Needling:  Treatment instructions: Expect mild to moderate muscle soreness. S/S of pneumothorax if dry needled over a lung field, and to seek immediate medical attention should they occur. Patient verbalized understanding of these instructions and education.  Patient Consent Given: Yes Education handout provided: No Muscles treated: bilateral upper traps, thoracic paraspinals, cervical paraspinals, splenius capitus  Electrical stimulation performed: No Parameters: N/A Treatment response/outcome: twitch response, decrease in muscle tone   [facility] Adult PT Treatment:                                                DATE: 08/14/2022 Therapeutic Exercise: UBE lvl 1.5 x 2/2 min while taking subjective  Seated high/low row 25# 3x10 each Seated bilateral ER GTB 2x15 Seated horizontal abd 2x15 GTB Supine on foam roller 2x15 each: protraction/retraction, alternating UE flexion/extension Manual Therapy: Suboccipital release Positional release BIL upper traps STM to cervical paraspinals  [facility] Adult PT Treatment:                                                DATE: 08/09/2022 Therapeutic Exercise: UBE lvl 1.0 x 4 min while taking subjective  Seated high/low row 25# 3x10 each Seated bilateral ER GTB 2x15 Seated horizontal abd  back 2x15 GTB S/L open book x 15 each Qped thread the needle with soft foam x 10 each Cat/cow x15 Manual Therapy: Suboccipital release STM to cervical paraspinals   [facility] Adult PT Treatment:                                                 DATE: 08/06/2022 Therapeutic Exercise: UBE lvl 1.0 x 4 min while taking subjective  Seated high/low row 25# 3x10 each Seated bilateral ER GTB 2x15 Supine horizontal abd  back against wall 2x15 GTB S/L open book x 15 each Qped thread the needle with soft foam x 10 each Standing chin tuck against wall x 10 - 5" hold Thoracic ext stretch up wall with soft foam x 10 Modalities: MHP to cervical paraspinals x 8 min in sitting post session Trigger Point Dry Needling Treatment: Pre-treatment instruction: Patient instructed on dry needling rationale, procedures, and possible side effects including pain during treatment (achy,cramping feeling), bruising, drop of blood, lightheadedness, nausea, sweating. Patient Consent Given: No Education handout provided: Yes Muscles treated: bilateral upper traps  Needle size and number: .30x73m x 4 Electrical stimulation performed: No Parameters: N/A Treatment response/outcome: Palpable decrease in muscle tension Post-treatment instructions: Patient instructed to expect possible mild to moderate muscle soreness later today and/or tomorrow. Patient instructed in methods to reduce muscle soreness and to continue prescribed HEP. If patient was dry needled over the lung field, patient was instructed on signs and symptoms of pneumothorax and, however unlikely, to see immediate medical attention should they occur. Patient was also educated on signs and symptoms of infection and to seek medical attention should they occur. Patient verbalized understanding of  these instructions and education.      PATIENT EDUCATION:  Education details: continue HEP Person educated: Patient Education method: Explanation, Demonstration, and Handouts Education comprehension: verbalized understanding and returned demonstration     HOME EXERCISE PROGRAM: Access Code: [email or web address] URL: [email or web address] Date: 06/26/2022 Prepared by:  Octavio Manns   Exercises - Median Nerve Tensioner  - 1 x daily - 7 x weekly - 2 sets - 10 reps - Standing Shoulder Row with Anchored Resistance  - 1 x daily - 7 x weekly - 3 sets - 10 reps - green  hold - Supine Chin Tuck  - 1 x daily - 7 x weekly - 2 sets - 10 reps - 3 sec hold   ASSESSMENT:   CLINICAL IMPRESSION: Pt was able to complete all prescribed exercises with no adverse effect or increase in pain. Responded well to manual therapy interventions, reporting slight decrease in tightness post session. Pt progressing well with therapy, will continue per POC.    OBJECTIVE IMPAIRMENTS decreased activity tolerance, decreased mobility, decreased ROM, decreased strength, postural dysfunction, and pain.    ACTIVITY LIMITATIONS carrying, lifting, reach over head, and caring for others   PARTICIPATION LIMITATIONS: driving, community activity, and occupation   PERSONAL FACTORS Time since onset of injury/illness/exacerbation and 1-2 comorbidities: Past C2 fx, TBI, ankle fx's  are also affecting patient's functional outcome.      GOALS: Goals reviewed with patient? No   SHORT TERM GOALS: Target date: 07/17/2022    Pt will be compliant and knowledgeable with initial HEP for improved comfort and carryover Baseline: initial HEP given  Goal status: MET   2.  Pt will self report neck pain no greater than 5/10 for improved comfort and functional ability Baseline: 8/10 at worst 07/19/2022: 6/10 at worst Goal status: ONGOING   LONG TERM GOALS: Target date: 09/18/2022   Pt will improve bilateral cervical rotation to at least 60 deg for improved functional ability with driving Baseline: see chart 07/19/2022: see chart Goal status: ONGOING   2.  Pt will decrease NDI disability score to no greater than 40% for improved subjective functional ability with work and community activities Baseline: 64% disability Goal status: Ongoing 08/02/22: 46%   3.  Pt will improve cervical flexor endurance  test hold time to no less than 45 sec for improved postural endurance and decrease neck pain Baseline: 24 sec 07/19/2022: 25 sec Goal status: ONGOING   4.  Pt will self report neck pain no greater than 1-2/10 for improved comfort and functional ability Baseline: 8/10 at worst 07/19/2022: 6/10 at worst Goal status: ONGOING     PLAN: PT FREQUENCY: 2x/week   PT DURATION: 6 weeks   PLANNED INTERVENTIONS: Therapeutic exercises, Therapeutic activity, Neuromuscular re-education, Balance training, Gait training, Patient/Family education, Self Care, Joint mobilization, Dry Needling, Electrical stimulation, Cryotherapy, Moist heat, Manual therapy, and Re-evaluation   PLAN FOR NEXT SESSION: assess HEP response, progress DNF and periscapular strength, manual and TPDN to neck and upper traps    Ward Chatters, PT 08/21/2022, 1:37 PM

## 2022-09-03 NOTE — Therapy (Signed)
OUTPATIENT PHYSICAL THERAPY TREATMENT NOTE   Patient Name: Monique [name] MRN: [id number] DOB:1986/05/21, 36 y.o., female Today's Date: 09/04/2022  PCP: Ladell Pier, MD REFERRING PROVIDER: Izora Ribas, MD  END OF SESSION:   PT End of Session - 09/04/22 1126     Visit Number 12    Number of Visits 17    Date for PT Re-Evaluation 09/18/22    Authorization Type [employer]    PT Start Time 1130    PT Stop Time 1208    PT Time Calculation (min) 38 min    Activity Tolerance Patient tolerated treatment well;Patient limited by pain    Behavior During Therapy WFL for tasks assessed/performed                 Past Medical History:  Diagnosis Date   Anemia    Anxiety    Cervicalgia    Chronic migraine with aura    Chronic pain syndrome    Cognitive deficit as late effect of traumatic brain injury ([place])    Depression    H/O cervical fracture    Heart murmur    Menorrhagia    TBI (traumatic brain injury) [facility])    Past Surgical History:  Procedure Laterality Date   APPENDECTOMY     BONE EXCISION Right 02/02/2021   Procedure: OSTEOPHYTE EXCISION;  Surgeon: Altamese Convoy, MD;  Location: [place];  Service: Orthopedics;  Laterality: Right;   Ceasarean section  2007, 2010,2014   EXTERNAL FIXATION LEG Bilateral 12/14/2012   Procedure: EXTERNAL FIXATION LEG;  Surgeon: [name], MD;  Location: [place];  Service: Orthopedics;  Laterality: Bilateral;   EXTERNAL FIXATION REMOVAL Bilateral 12/23/2012   Procedure: REMOVAL EXTERNAL FIXATION LEG;  Surgeon: [name], MD;  Location: [place];  Service: Orthopedics;  Laterality: Bilateral;   HARDWARE REMOVAL Right 02/02/2021   Procedure: HARDWARE REMOVAL;  Surgeon: Altamese [place], MD;  Location: [place];  Service: Orthopedics;  Laterality: Right;   I & D EXTREMITY Left 12/14/2012   Procedure: IRRIGATION AND DEBRIDEMENT EXTREMITY;  Surgeon: [name], MD;  Location: [place];  Service: Orthopedics;  Laterality: Left;    ORIF ANKLE FRACTURE Left 12/14/2012   Procedure: OPEN REDUCTION INTERNAL FIXATION (ORIF) ANKLE FRACTURE;  Surgeon: [name], MD;  Location: [place];  Service: Orthopedics;  Laterality: Left;   ORIF TIBIA FRACTURE Bilateral 12/23/2012   Procedure: OPEN REDUCTION INTERNAL FIXATION (ORIF) BILATERAL DISTAL TIBIA FRACTURE;  Surgeon: [name], MD;  Location: [place];  Service: Orthopedics;  Laterality: Bilateral;   Patient Active Problem List   Diagnosis Date Noted   Fibroids 10/19/2020   History of uterine fibroid 08/12/2020   Menorrhagia with irregular cycle 08/12/2020   Gastroesophageal reflux disease without esophagitis 06/02/2020   Keloid of skin 06/02/2020   Vitamin D deficiency 04/29/2019   Iron deficiency anemia    Iliotibial band syndrome affecting right lower leg 11/05/2018   Therapeutic opioid induced constipation 11/05/2018   Chronic pain syndrome 05/12/2018   Cognitive deficit as late effect of traumatic brain injury ([place]) 08/31/2014   TBI (traumatic brain injury) ([place]) 09/16/2013   Tibial nerve lesion 03/31/2013   Neck mass 02/06/2013   New onset of headaches due to trauma 01/02/2013   Migraine 12/21/2012   Concussion 12/18/2012   Closed C2 fracture ([place]) 12/15/2012   Fracture, sternum closed 12/15/2012   Bilateral ankle fractures 12/15/2012   Pulmonary contusion 12/15/2012    REFERRING DIAG: M54.2 (ICD-10-CM) - Cervicalgia  THERAPY DIAG:  Cervicalgia  Muscle weakness (generalized)  Rationale for Evaluation and Treatment Rehabilitation  PERTINENT HISTORY: Past C2 fx, TBI, ankle fx's  PRECAUTIONS: None  SUBJECTIVE: Pt presents to PT with reports of increased pain and tightness today. Pt has been compliant with HEP with no adverse effect. She is ready to begin PT at this time.   PAIN:  Are you having pain?  Yes: NPRS scale: 6/10 (8-9/10 at worst) Pain location: neck, upper traps Pain description: stiffness Aggravating factors: sitting at  desk Relieving factors: none   OBJECTIVE: (objective measures completed at initial evaluation unless otherwise dated)  PATIENT SURVEYS:  NDI: 64% disability - eval NDI: 58% disability - 07/19/2022 NDI: 46 % disability - 08/02/2022   COGNITION: Overall cognitive status: Within functional limits for tasks assessed   SENSATION: WFL   POSTURE: rounded shoulders and forward head   PALPATION: TTP to bilateral upper traps and cervical paraspinals       CERVICAL ROM:    Active ROM A/PROM (deg) eval A/PROM (deg) 07/19/2022  Flexion     Extension     Right lateral flexion     Left lateral flexion     Right rotation 35 38  Left rotation 30 30   (Blank rows = not tested)   UE Myotomes:   Myotome Right Left  C5 5/5 5/5  C6 5/5 5/5  C7 5/5 5/5  C8 5/5 5/5  T1 5/5 5/5    UPPER EXTREMITY ROM:   Active ROM Right eval Left eval  Shoulder flexion [facility]  Shoulder extension      Shoulder abduction [facility]  Shoulder adduction      Shoulder extension      Shoulder internal rotation      Shoulder external rotation      Elbow flexion      Elbow extension       (Blank rows = not tested)   UPPER EXTREMITY MMT:   MMT Right eval Left eval  Shoulder flexion [facility]  Shoulder extension      Shoulder abduction [facility]  Shoulder adduction      Shoulder extension      Shoulder internal rotation      Shoulder external rotation      Middle trapezius 3+/5 3+/5  Lower trapezius 3+/5 3+/5  Elbow flexion      Elbow extension      Wrist flexion      Wrist extension      Wrist ulnar deviation      Wrist radial deviation      Wrist pronation      Wrist supination      Grip strength       (Blank rows = not tested)   FUNCTIONAL TESTS:  Cervical flexor endurance test: 25 sec     TODAY'S TREATMENT:  [facility] Adult PT Treatment:                                                DATE: 09/04/2022 Therapeutic Exercise: UBE lvl 1.5 x 2/2 min while taking subjective  Standing row  2x10 20# Standing extension 2x10 20# Seated bilateral ER GTB 2x15 Supine horizontal abd 2x15 GTB Supine serratus punch 2x10 3# Manual Therapy: Suboccipital release Positional release BIL upper traps STM to cervical paraspinals  [facility] Adult PT Treatment:  DATE: 08/21/2022 Therapeutic Exercise: UBE lvl 1.5 x 2/2 min while taking subjective  Standing row 2x10 20# Standing extension 2x10 20# Seated bilateral ER GTB 2x15 Supine horizontal abd 2x15 GTB Manual Therapy: Suboccipital release Positional release BIL upper traps STM to cervical paraspinals  [facility] Adult PT Treatment:                                                DATE: 08/20/2022 Therapeutic Exercise: UBE lvl 1.5 x 2/2 min while taking subjective  Seated high/low row 30# 3x10 each Manual Therapy: STM to cervical paraspinals and upper traps Trigger Point Dry-Needling:  Treatment instructions: Expect mild to moderate muscle soreness. S/S of pneumothorax if dry needled over a lung field, and to seek immediate medical attention should they occur. Patient verbalized understanding of these instructions and education.  Patient Consent Given: Yes Education handout provided: No Muscles treated: bilateral upper traps, thoracic paraspinals, cervical paraspinals, splenius capitus  Electrical stimulation performed: No Parameters: N/A Treatment response/outcome: twitch response, decrease in muscle tone   [facility] Adult PT Treatment:                                                DATE: 08/14/2022 Therapeutic Exercise: UBE lvl 1.5 x 2/2 min while taking subjective  Seated high/low row 25# 3x10 each Seated bilateral ER GTB 2x15 Seated horizontal abd 2x15 GTB Supine on foam roller 2x15 each: protraction/retraction, alternating UE flexion/extension Manual Therapy: Suboccipital release Positional release BIL upper traps STM to cervical paraspinals  [facility] Adult PT Treatment:                                                 DATE: 08/09/2022 Therapeutic Exercise: UBE lvl 1.0 x 4 min while taking subjective  Seated high/low row 25# 3x10 each Seated bilateral ER GTB 2x15 Seated horizontal abd  back 2x15 GTB S/L open book x 15 each Qped thread the needle with soft foam x 10 each Cat/cow x15 Manual Therapy: Suboccipital release STM to cervical paraspinals   [facility] Adult PT Treatment:                                                DATE: 08/06/2022 Therapeutic Exercise: UBE lvl 1.0 x 4 min while taking subjective  Seated high/low row 25# 3x10 each Seated bilateral ER GTB 2x15 Supine horizontal abd  back against wall 2x15 GTB S/L open book x 15 each Qped thread the needle with soft foam x 10 each Standing chin tuck against wall x 10 - 5" hold Thoracic ext stretch up wall with soft foam x 10 Modalities: MHP to cervical paraspinals x 8 min in sitting post session Trigger Point Dry Needling Treatment: Pre-treatment instruction: Patient instructed on dry needling rationale, procedures, and possible side effects including pain during treatment (achy,cramping feeling), bruising, drop of blood, lightheadedness, nausea, sweating. Patient Consent Given: No Education handout provided: Yes Muscles treated: bilateral upper traps  Needle size and  number: .30x22m x 4 Electrical stimulation performed: No Parameters: N/A Treatment response/outcome: Palpable decrease in muscle tension Post-treatment instructions: Patient instructed to expect possible mild to moderate muscle soreness later today and/or tomorrow. Patient instructed in methods to reduce muscle soreness and to continue prescribed HEP. If patient was dry needled over the lung field, patient was instructed on signs and symptoms of pneumothorax and, however unlikely, to see immediate medical attention should they occur. Patient was also educated on signs and symptoms of infection and to seek medical attention should they occur. Patient  verbalized understanding of these instructions and education.      PATIENT EDUCATION:  Education details: continue HEP Person educated: Patient Education method: Explanation, Demonstration, and Handouts Education comprehension: verbalized understanding and returned demonstration     HOME EXERCISE PROGRAM: Access Code: [email or web address]: [email or web address] Date: 06/26/2022 Prepared by: DOctavio Manns  Exercises - Median Nerve Tensioner  - 1 x daily - 7 x weekly - 2 sets - 10 reps - Standing Shoulder Row with Anchored Resistance  - 1 x daily - 7 x weekly - 3 sets - 10 reps - green  hold - Supine Chin Tuck  - 1 x daily - 7 x weekly - 2 sets - 10 reps - 3 sec hold   ASSESSMENT:   CLINICAL IMPRESSION: Pt was able to complete all prescribed exercises with no adverse effect or increase in pain. Responded well to manual therapy interventions, reporting slight decrease in tightness post session. Pt progressing well with therapy, will continue per POC.     OBJECTIVE IMPAIRMENTS decreased activity tolerance, decreased mobility, decreased ROM, decreased strength, postural dysfunction, and pain.    ACTIVITY LIMITATIONS carrying, lifting, reach over head, and caring for others   PARTICIPATION LIMITATIONS: driving, community activity, and occupation   PERSONAL FACTORS Time since onset of injury/illness/exacerbation and 1-2 comorbidities: Past C2 fx, TBI, ankle fx's  are also affecting patient's functional outcome.      GOALS: Goals reviewed with patient? No   SHORT TERM GOALS: Target date: 07/17/2022    Pt will be compliant and knowledgeable with initial HEP for improved comfort and carryover Baseline: initial HEP given  Goal status: MET   2.  Pt will self report neck pain no greater than 5/10 for improved comfort and functional ability Baseline: 8/10 at worst 07/19/2022: 6/10 at worst Goal status: ONGOING   LONG TERM GOALS: Target date: 09/18/2022   Pt will improve  bilateral cervical rotation to at least 60 deg for improved functional ability with driving Baseline: see chart 07/19/2022: see chart Goal status: ONGOING   2.  Pt will decrease NDI disability score to no greater than 40% for improved subjective functional ability with work and community activities Baseline: 64% disability Goal status: Ongoing 08/02/22: 46%   3.  Pt will improve cervical flexor endurance test hold time to no less than 45 sec for improved postural endurance and decrease neck pain Baseline: 24 sec 07/19/2022: 25 sec Goal status: ONGOING   4.  Pt will self report neck pain no greater than 1-2/10 for improved comfort and functional ability Baseline: 8/10 at worst 07/19/2022: 6/10 at worst Goal status: ONGOING     PLAN: PT FREQUENCY: 2x/week   PT DURATION: 6 weeks   PLANNED INTERVENTIONS: Therapeutic exercises, Therapeutic activity, Neuromuscular re-education, Balance training, Gait training, Patient/Family education, Self Care, Joint mobilization, Dry Needling, Electrical stimulation, Cryotherapy, Moist heat, Manual therapy, and Re-evaluation   PLAN FOR NEXT SESSION: assess HEP response, progress DNF and  periscapular strength, manual and TPDN to neck and upper traps    Ward Chatters, PT 09/04/2022, 12:11 PM

## 2022-09-04 ENCOUNTER — Ambulatory Visit: Payer: Medicaid Other

## 2022-09-04 DIAGNOSIS — M542 Cervicalgia: Secondary | ICD-10-CM

## 2022-09-04 DIAGNOSIS — M6281 Muscle weakness (generalized): Secondary | ICD-10-CM

## 2022-09-05 NOTE — Therapy (Signed)
OUTPATIENT PHYSICAL THERAPY TREATMENT NOTE   Patient Name: Monique [name] MRN: [id number] DOB:1986-04-06, 36 y.o., female Today's Date: 09/06/2022  PCP: Ladell Pier, MD REFERRING PROVIDER: Izora Ribas, MD  END OF SESSION:   PT End of Session - 09/06/22 1138     Visit Number 13    Number of Visits 17    Date for PT Re-Evaluation 09/18/22    Authorization Type [employer]    PT Start Time 1139    PT Stop Time 1215    PT Time Calculation (min) 36 min    Activity Tolerance Patient tolerated treatment well;Patient limited by pain    Behavior During Therapy WFL for tasks assessed/performed                  Past Medical History:  Diagnosis Date   Anemia    Anxiety    Cervicalgia    Chronic migraine with aura    Chronic pain syndrome    Cognitive deficit as late effect of traumatic brain injury ([place])    Depression    H/O cervical fracture    Heart murmur    Menorrhagia    TBI (traumatic brain injury) [facility])    Past Surgical History:  Procedure Laterality Date   APPENDECTOMY     BONE EXCISION Right 02/02/2021   Procedure: OSTEOPHYTE EXCISION;  Surgeon: Altamese [place], MD;  Location: [place];  Service: Orthopedics;  Laterality: Right;   Ceasarean section  2007, 2010,2014   EXTERNAL FIXATION LEG Bilateral 12/14/2012   Procedure: EXTERNAL FIXATION LEG;  Surgeon: [name], MD;  Location: [place];  Service: Orthopedics;  Laterality: Bilateral;   EXTERNAL FIXATION REMOVAL Bilateral 12/23/2012   Procedure: REMOVAL EXTERNAL FIXATION LEG;  Surgeon: [name], MD;  Location: [place];  Service: Orthopedics;  Laterality: Bilateral;   HARDWARE REMOVAL Right 02/02/2021   Procedure: HARDWARE REMOVAL;  Surgeon: Altamese [place], MD;  Location: [place];  Service: Orthopedics;  Laterality: Right;   I & D EXTREMITY Left 12/14/2012   Procedure: IRRIGATION AND DEBRIDEMENT EXTREMITY;  Surgeon: [name], MD;  Location: [place];  Service: Orthopedics;  Laterality: Left;    ORIF ANKLE FRACTURE Left 12/14/2012   Procedure: OPEN REDUCTION INTERNAL FIXATION (ORIF) ANKLE FRACTURE;  Surgeon: [name], MD;  Location: [place];  Service: Orthopedics;  Laterality: Left;   ORIF TIBIA FRACTURE Bilateral 12/23/2012   Procedure: OPEN REDUCTION INTERNAL FIXATION (ORIF) BILATERAL DISTAL TIBIA FRACTURE;  Surgeon: [name], MD;  Location: [place];  Service: Orthopedics;  Laterality: Bilateral;   Patient Active Problem List   Diagnosis Date Noted   Fibroids 10/19/2020   History of uterine fibroid 08/12/2020   Menorrhagia with irregular cycle 08/12/2020   Gastroesophageal reflux disease without esophagitis 06/02/2020   Keloid of skin 06/02/2020   Vitamin D deficiency 04/29/2019   Iron deficiency anemia    Iliotibial band syndrome affecting right lower leg 11/05/2018   Therapeutic opioid induced constipation 11/05/2018   Chronic pain syndrome 05/12/2018   Cognitive deficit as late effect of traumatic brain injury ([place]) 08/31/2014   TBI (traumatic brain injury) ([place]) 09/16/2013   Tibial nerve lesion 03/31/2013   Neck mass 02/06/2013   New onset of headaches due to trauma 01/02/2013   Migraine 12/21/2012   Concussion 12/18/2012   Closed C2 fracture ([place]) 12/15/2012   Fracture, sternum closed 12/15/2012   Bilateral ankle fractures 12/15/2012   Pulmonary contusion 12/15/2012    REFERRING DIAG: M54.2 (ICD-10-CM) - Cervicalgia  THERAPY DIAG:  Cervicalgia  Muscle weakness (generalized)  Rationale for Evaluation and Treatment Rehabilitation  PERTINENT HISTORY: Past C2 fx, TBI, ankle fx's  PRECAUTIONS: None  SUBJECTIVE: Pt presents to PT with reports of continued neck pain and tightness. Has been compliant with HEP with no adverse effect. Pt is ready to begin PT at this time.   PAIN:  Are you having pain?  Yes: NPRS scale: 6/10 (8-9/10 at worst) Pain location: neck, upper traps Pain description: stiffness Aggravating factors: sitting at desk Relieving  factors: none   OBJECTIVE: (objective measures completed at initial evaluation unless otherwise dated)  PATIENT SURVEYS:  NDI: 64% disability - eval NDI: 58% disability - 07/19/2022 NDI: 46 % disability - 08/02/2022   COGNITION: Overall cognitive status: Within functional limits for tasks assessed   SENSATION: WFL   POSTURE: rounded shoulders and forward head   PALPATION: TTP to bilateral upper traps and cervical paraspinals       CERVICAL ROM:    Active ROM A/PROM (deg) eval A/PROM (deg) 07/19/2022  Flexion     Extension     Right lateral flexion     Left lateral flexion     Right rotation 35 38  Left rotation 30 30   (Blank rows = not tested)   UE Myotomes:   Myotome Right Left  C5 5/5 5/5  C6 5/5 5/5  C7 5/5 5/5  C8 5/5 5/5  T1 5/5 5/5    UPPER EXTREMITY ROM:   Active ROM Right eval Left eval  Shoulder flexion [facility]  Shoulder extension      Shoulder abduction [facility]  Shoulder adduction      Shoulder extension      Shoulder internal rotation      Shoulder external rotation      Elbow flexion      Elbow extension       (Blank rows = not tested)   UPPER EXTREMITY MMT:   MMT Right eval Left eval  Shoulder flexion [facility]  Shoulder extension      Shoulder abduction [facility]  Shoulder adduction      Shoulder extension      Shoulder internal rotation      Shoulder external rotation      Middle trapezius 3+/5 3+/5  Lower trapezius 3+/5 3+/5  Elbow flexion      Elbow extension      Wrist flexion      Wrist extension      Wrist ulnar deviation      Wrist radial deviation      Wrist pronation      Wrist supination      Grip strength       (Blank rows = not tested)   FUNCTIONAL TESTS:  Cervical flexor endurance test: 25 sec     TODAY'S TREATMENT:  [facility] Adult PT Treatment:                                                DATE: 09/06/2022 Therapeutic Exercise: UBE lvl 1.5 x 1.5/1.5 min while taking subjective  Standing row 3x10  20# Standing extension 2x10 20# Seated bilateral ER GTB 2x15 Seated horizontal abd 2x15 GTB Seated thoracic ext over soft foam x 10 Cervical ext SNAG with towel x 10  Manual Therapy: Suboccipital release Positional release BIL upper traps STM to  cervical paraspinals Grade V prone thrust manipulation to thoracic spine  [facility] Adult PT Treatment:                                                DATE: 09/04/2022 Therapeutic Exercise: UBE lvl 1.5 x 2/2 min while taking subjective  Standing row 2x10 20# Standing extension 2x10 20# Seated bilateral ER GTB 2x15 Supine horizontal abd 2x15 GTB Supine serratus punch 2x10 3# Manual Therapy: Suboccipital release Positional release BIL upper traps STM to cervical paraspinals  [facility] Adult PT Treatment:                                                DATE: 08/21/2022 Therapeutic Exercise: UBE lvl 1.5 x 2/2 min while taking subjective  Standing row 2x10 20# Standing extension 2x10 20# Seated bilateral ER GTB 2x15 Supine horizontal abd 2x15 GTB Manual Therapy: Suboccipital release Positional release BIL upper traps STM to cervical paraspinals  [facility] Adult PT Treatment:                                                DATE: 08/20/2022 Therapeutic Exercise: UBE lvl 1.5 x 2/2 min while taking subjective  Seated high/low row 30# 3x10 each Manual Therapy: STM to cervical paraspinals and upper traps Trigger Point Dry-Needling:  Treatment instructions: Expect mild to moderate muscle soreness. S/S of pneumothorax if dry needled over a lung field, and to seek immediate medical attention should they occur. Patient verbalized understanding of these instructions and education.  Patient Consent Given: Yes Education handout provided: No Muscles treated: bilateral upper traps, thoracic paraspinals, cervical paraspinals, splenius capitus  Electrical stimulation performed: No Parameters: N/A Treatment response/outcome: twitch response, decrease in muscle  tone   [facility] Adult PT Treatment:                                                DATE: 08/14/2022 Therapeutic Exercise: UBE lvl 1.5 x 2/2 min while taking subjective  Seated high/low row 25# 3x10 each Seated bilateral ER GTB 2x15 Seated horizontal abd 2x15 GTB Supine on foam roller 2x15 each: protraction/retraction, alternating UE flexion/extension Manual Therapy: Suboccipital release Positional release BIL upper traps STM to cervical paraspinals  [facility] Adult PT Treatment:                                                DATE: 08/09/2022 Therapeutic Exercise: UBE lvl 1.0 x 4 min while taking subjective  Seated high/low row 25# 3x10 each Seated bilateral ER GTB 2x15 Seated horizontal abd  back 2x15 GTB S/L open book x 15 each Qped thread the needle with soft foam x 10 each Cat/cow x15 Manual Therapy: Suboccipital release STM to cervical paraspinals   [facility] Adult PT Treatment:  DATE: 08/06/2022 Therapeutic Exercise: UBE lvl 1.0 x 4 min while taking subjective  Seated high/low row 25# 3x10 each Seated bilateral ER GTB 2x15 Supine horizontal abd  back against wall 2x15 GTB S/L open book x 15 each Qped thread the needle with soft foam x 10 each Standing chin tuck against wall x 10 - 5" hold Thoracic ext stretch up wall with soft foam x 10 Modalities: MHP to cervical paraspinals x 8 min in sitting post session Trigger Point Dry Needling Treatment: Pre-treatment instruction: Patient instructed on dry needling rationale, procedures, and possible side effects including pain during treatment (achy,cramping feeling), bruising, drop of blood, lightheadedness, nausea, sweating. Patient Consent Given: No Education handout provided: Yes Muscles treated: bilateral upper traps  Needle size and number: .30x66m x 4 Electrical stimulation performed: No Parameters: N/A Treatment response/outcome: Palpable decrease in muscle tension Post-treatment  instructions: Patient instructed to expect possible mild to moderate muscle soreness later today and/or tomorrow. Patient instructed in methods to reduce muscle soreness and to continue prescribed HEP. If patient was dry needled over the lung field, patient was instructed on signs and symptoms of pneumothorax and, however unlikely, to see immediate medical attention should they occur. Patient was also educated on signs and symptoms of infection and to seek medical attention should they occur. Patient verbalized understanding of these instructions and education.      PATIENT EDUCATION:  Education details: continue HEP Person educated: Patient Education method: Explanation, Demonstration, and Handouts Education comprehension: verbalized understanding and returned demonstration     HOME EXERCISE PROGRAM: Access Code: [email or web address]: [email or web address] Date: 06/26/2022 Prepared by: DOctavio Manns  Exercises - Median Nerve Tensioner  - 1 x daily - 7 x weekly - 2 sets - 10 reps - Standing Shoulder Row with Anchored Resistance  - 1 x daily - 7 x weekly - 3 sets - 10 reps - green  hold - Supine Chin Tuck  - 1 x daily - 7 x weekly - 2 sets - 10 reps - 3 sec hold   ASSESSMENT:   CLINICAL IMPRESSION: Pt was able to complete prescribed exercises, noted decrease in pain post session. Responded well to manual therapy interventions, reporting slight decrease in tightness and decreased pain after grade V thoracic manipulation. Pt progressing well with therapy, will continue per POC.      OBJECTIVE IMPAIRMENTS decreased activity tolerance, decreased mobility, decreased ROM, decreased strength, postural dysfunction, and pain.    ACTIVITY LIMITATIONS carrying, lifting, reach over head, and caring for others   PARTICIPATION LIMITATIONS: driving, community activity, and occupation   PERSONAL FACTORS Time since onset of injury/illness/exacerbation and 1-2 comorbidities: Past C2 fx, TBI,  ankle fx's  are also affecting patient's functional outcome.      GOALS: Goals reviewed with patient? No   SHORT TERM GOALS: Target date: 07/17/2022    Pt will be compliant and knowledgeable with initial HEP for improved comfort and carryover Baseline: initial HEP given  Goal status: MET   2.  Pt will self report neck pain no greater than 5/10 for improved comfort and functional ability Baseline: 8/10 at worst 07/19/2022: 6/10 at worst Goal status: ONGOING   LONG TERM GOALS: Target date: 09/18/2022   Pt will improve bilateral cervical rotation to at least 60 deg for improved functional ability with driving Baseline: see chart 07/19/2022: see chart Goal status: ONGOING   2.  Pt will decrease NDI disability score to no greater than 40% for improved subjective functional ability with work and  community activities Baseline: 64% disability Goal status: Ongoing 08/02/22: 46%   3.  Pt will improve cervical flexor endurance test hold time to no less than 45 sec for improved postural endurance and decrease neck pain Baseline: 24 sec 07/19/2022: 25 sec Goal status: ONGOING   4.  Pt will self report neck pain no greater than 1-2/10 for improved comfort and functional ability Baseline: 8/10 at worst 07/19/2022: 6/10 at worst Goal status: ONGOING     PLAN: PT FREQUENCY: 2x/week   PT DURATION: 6 weeks   PLANNED INTERVENTIONS: Therapeutic exercises, Therapeutic activity, Neuromuscular re-education, Balance training, Gait training, Patient/Family education, Self Care, Joint mobilization, Dry Needling, Electrical stimulation, Cryotherapy, Moist heat, Manual therapy, and Re-evaluation   PLAN FOR NEXT SESSION: assess HEP response, progress DNF and periscapular strength, manual and TPDN to neck and upper traps    Ward Chatters, PT 09/06/2022, 1:39 PM

## 2022-09-06 ENCOUNTER — Ambulatory Visit: Payer: Medicaid Other

## 2022-09-06 DIAGNOSIS — M542 Cervicalgia: Secondary | ICD-10-CM | POA: Diagnosis not present

## 2022-09-06 DIAGNOSIS — M6281 Muscle weakness (generalized): Secondary | ICD-10-CM

## 2022-09-11 ENCOUNTER — Encounter: Payer: Self-pay | Admitting: Internal Medicine

## 2022-09-11 ENCOUNTER — Ambulatory Visit: Payer: Medicaid Other | Attending: Physical Medicine and Rehabilitation

## 2022-09-11 ENCOUNTER — Telehealth: Payer: Self-pay | Admitting: Adult Health

## 2022-09-11 DIAGNOSIS — M25511 Pain in right shoulder: Secondary | ICD-10-CM | POA: Insufficient documentation

## 2022-09-11 DIAGNOSIS — G8929 Other chronic pain: Secondary | ICD-10-CM | POA: Diagnosis present

## 2022-09-11 DIAGNOSIS — G252 Other specified forms of tremor: Secondary | ICD-10-CM

## 2022-09-11 DIAGNOSIS — R200 Anesthesia of skin: Secondary | ICD-10-CM

## 2022-09-11 DIAGNOSIS — R2681 Unsteadiness on feet: Secondary | ICD-10-CM | POA: Diagnosis present

## 2022-09-11 DIAGNOSIS — M6281 Muscle weakness (generalized): Secondary | ICD-10-CM | POA: Diagnosis present

## 2022-09-11 DIAGNOSIS — M542 Cervicalgia: Secondary | ICD-10-CM | POA: Diagnosis present

## 2022-09-11 DIAGNOSIS — L910 Hypertrophic scar: Secondary | ICD-10-CM

## 2022-09-11 DIAGNOSIS — R55 Syncope and collapse: Secondary | ICD-10-CM

## 2022-09-11 DIAGNOSIS — R251 Tremor, unspecified: Secondary | ICD-10-CM

## 2022-09-11 NOTE — Therapy (Signed)
OUTPATIENT PHYSICAL THERAPY TREATMENT NOTE   Patient Name: Monique [name] MRN: [id number] DOB:1986/08/10, 36 y.o., female Today's Date: 09/11/2022  PCP: Ladell Pier, MD REFERRING PROVIDER: Izora Ribas, MD  END OF SESSION:   PT End of Session - 09/11/22 1136     Visit Number 14    Number of Visits 17    Date for PT Re-Evaluation 09/18/22    Authorization Type [employer]    PT Start Time 1136    PT Stop Time 1215    PT Time Calculation (min) 39 min    Activity Tolerance Patient tolerated treatment well;Patient limited by pain    Behavior During Therapy WFL for tasks assessed/performed                   Past Medical History:  Diagnosis Date   Anemia    Anxiety    Cervicalgia    Chronic migraine with aura    Chronic pain syndrome    Cognitive deficit as late effect of traumatic brain injury ([place])    Depression    H/O cervical fracture    Heart murmur    Menorrhagia    TBI (traumatic brain injury) [facility])    Past Surgical History:  Procedure Laterality Date   APPENDECTOMY     BONE EXCISION Right 02/02/2021   Procedure: OSTEOPHYTE EXCISION;  Surgeon: Altamese Coldwater, MD;  Location: [place];  Service: Orthopedics;  Laterality: Right;   Ceasarean section  2007, 2010,2014   EXTERNAL FIXATION LEG Bilateral 12/14/2012   Procedure: EXTERNAL FIXATION LEG;  Surgeon: [name], MD;  Location: [place];  Service: Orthopedics;  Laterality: Bilateral;   EXTERNAL FIXATION REMOVAL Bilateral 12/23/2012   Procedure: REMOVAL EXTERNAL FIXATION LEG;  Surgeon: [name], MD;  Location: [place];  Service: Orthopedics;  Laterality: Bilateral;   HARDWARE REMOVAL Right 02/02/2021   Procedure: HARDWARE REMOVAL;  Surgeon: Altamese Salemburg, MD;  Location: [place];  Service: Orthopedics;  Laterality: Right;   I & D EXTREMITY Left 12/14/2012   Procedure: IRRIGATION AND DEBRIDEMENT EXTREMITY;  Surgeon: [name], MD;  Location: [place];  Service: Orthopedics;  Laterality:  Left;   ORIF ANKLE FRACTURE Left 12/14/2012   Procedure: OPEN REDUCTION INTERNAL FIXATION (ORIF) ANKLE FRACTURE;  Surgeon: [name], MD;  Location: [place];  Service: Orthopedics;  Laterality: Left;   ORIF TIBIA FRACTURE Bilateral 12/23/2012   Procedure: OPEN REDUCTION INTERNAL FIXATION (ORIF) BILATERAL DISTAL TIBIA FRACTURE;  Surgeon: [name], MD;  Location: [place];  Service: Orthopedics;  Laterality: Bilateral;   Patient Active Problem List   Diagnosis Date Noted   Fibroids 10/19/2020   History of uterine fibroid 08/12/2020   Menorrhagia with irregular cycle 08/12/2020   Gastroesophageal reflux disease without esophagitis 06/02/2020   Keloid of skin 06/02/2020   Vitamin D deficiency 04/29/2019   Iron deficiency anemia    Iliotibial band syndrome affecting right lower leg 11/05/2018   Therapeutic opioid induced constipation 11/05/2018   Chronic pain syndrome 05/12/2018   Cognitive deficit as late effect of traumatic brain injury ([place]) 08/31/2014   TBI (traumatic brain injury) ([place]) 09/16/2013   Tibial nerve lesion 03/31/2013   Neck mass 02/06/2013   New onset of headaches due to trauma 01/02/2013   Migraine 12/21/2012   Concussion 12/18/2012   Closed C2 fracture ([place]) 12/15/2012   Fracture, sternum closed 12/15/2012   Bilateral ankle fractures 12/15/2012   Pulmonary contusion 12/15/2012    REFERRING DIAG: M54.2 (ICD-10-CM) -  Cervicalgia  THERAPY DIAG:  Cervicalgia - Plan: PT plan of care cert/re-cert  Muscle weakness (generalized) - Plan: PT plan of care cert/re-cert  Chronic right shoulder pain - Plan: PT plan of care cert/re-cert  Unsteadiness on feet - Plan: PT plan of care cert/re-cert  Rationale for Evaluation and Treatment Rehabilitation  PERTINENT HISTORY: Past C2 fx, TBI, ankle fx's  PRECAUTIONS: None  SUBJECTIVE: Pt presents to PT with continued neck tightness. Has been compliant with HEP. Ready to begin PT at this time.   PAIN:  Are you having  pain?  Yes: NPRS scale: 6/10 (8-9/10 at worst) Pain location: neck, upper traps Pain description: stiffness Aggravating factors: sitting at desk Relieving factors: none   OBJECTIVE: (objective measures completed at initial evaluation unless otherwise dated)  PATIENT SURVEYS:  NDI: 64% disability - eval NDI: 58% disability - 07/19/2022 NDI: 46 % disability - 08/02/2022 NDI: 44 % disability - 09/11/2022   COGNITION: Overall cognitive status: Within functional limits for tasks assessed   SENSATION: WFL   POSTURE: rounded shoulders and forward head   PALPATION: TTP to bilateral upper traps and cervical paraspinals       CERVICAL ROM:    Active ROM A/PROM (deg) eval A/PROM (deg) 07/19/2022 A/PROM (deg) 09/11/2022  Flexion      Extension      Right lateral flexion      Left lateral flexion      Right rotation 35 38 40  Left rotation 30 30 65   (Blank rows = not tested)   UE Myotomes:   Myotome Right Left  C5 5/5 5/5  C6 5/5 5/5  C7 5/5 5/5  C8 5/5 5/5  T1 5/5 5/5    UPPER EXTREMITY ROM:   Active ROM Right eval Left eval  Shoulder flexion [facility]  Shoulder extension      Shoulder abduction [facility]  Shoulder adduction      Shoulder extension      Shoulder internal rotation      Shoulder external rotation      Elbow flexion      Elbow extension       (Blank rows = not tested)   UPPER EXTREMITY MMT:   MMT Right eval Left eval  Shoulder flexion [facility]  Shoulder extension      Shoulder abduction [facility]  Shoulder adduction      Shoulder extension      Shoulder internal rotation      Shoulder external rotation      Middle trapezius 3+/5 3+/5  Lower trapezius 3+/5 3+/5  Elbow flexion      Elbow extension      Wrist flexion      Wrist extension      Wrist ulnar deviation      Wrist radial deviation      Wrist pronation      Wrist supination      Grip strength       (Blank rows = not tested)   FUNCTIONAL TESTS:  Cervical flexor endurance  test: 35 sec - 12/5/20223     TODAY'S TREATMENT:  [facility] Adult PT Treatment:                                                DATE: 09/11/2022 Therapeutic Exercise: UBE lvl 1.5 x 1.5/1.5 min while taking subjective  Standing row 3x10 20# Standing extension 2x10 20# Seated bilateral ER GTB 2x15 Seated horizontal abd 2x15 GTB Supine serratus punch 2x10 3" Supine chin tuck x 10 - 5" hold Manual Therapy: Suboccipital release Positional release BIL upper traps STM to cervical paraspinals Grade V prone thrust manipulation to thoracic spine  [facility] Adult PT Treatment:                                                DATE: 09/06/2022 Therapeutic Exercise: UBE lvl 1.5 x 1.5/1.5 min while taking subjective  Standing row 3x10 20# Standing extension 2x10 20# Seated bilateral ER GTB 2x15 Seated horizontal abd 2x15 GTB Seated thoracic ext over soft foam x 10 Cervical ext SNAG with towel x 10  Manual Therapy: Suboccipital release Positional release BIL upper traps STM to cervical paraspinals Grade V prone thrust manipulation to thoracic spine  [facility] Adult PT Treatment:                                                DATE: 09/04/2022 Therapeutic Exercise: UBE lvl 1.5 x 2/2 min while taking subjective  Standing row 2x10 20# Standing extension 2x10 20# Seated bilateral ER GTB 2x15 Supine horizontal abd 2x15 GTB Supine serratus punch 2x10 3# Manual Therapy: Suboccipital release Positional release BIL upper traps STM to cervical paraspinals  PATIENT EDUCATION:  Education details: continue HEP Person educated: Patient Education method: Explanation, Demonstration, and Handouts Education comprehension: verbalized understanding and returned demonstration     HOME EXERCISE PROGRAM: Access Code: [email or web address] URL: [email or web address] Date: 06/26/2022 Prepared by: Octavio Manns   Exercises - Median Nerve Tensioner  - 1 x daily - 7 x weekly - 2 sets - 10 reps - Standing Shoulder Row  with Anchored Resistance  - 1 x daily - 7 x weekly - 3 sets - 10 reps - green  hold - Supine Chin Tuck  - 1 x daily - 7 x weekly - 2 sets - 10 reps - 3 sec hold   ASSESSMENT:   CLINICAL IMPRESSION: Pt was able to complete all prescribed exercises with no adverse effect or increase in pain. Is is progressing well with therapy, showing improving ROM and decrease in NDI disability score. She does continue to require skilled PT services for improving periscapular and DNF strength in order to decrease neck pain. Will continue to progress as able per POC as prescribed.    OBJECTIVE IMPAIRMENTS decreased activity tolerance, decreased mobility, decreased ROM, decreased strength, postural dysfunction, and pain.    ACTIVITY LIMITATIONS carrying, lifting, reach over head, and caring for others   PARTICIPATION LIMITATIONS: driving, community activity, and occupation   PERSONAL FACTORS Time since onset of injury/illness/exacerbation and 1-2 comorbidities: Past C2 fx, TBI, ankle fx's  are also affecting patient's functional outcome.      GOALS: Goals reviewed with patient? No   SHORT TERM GOALS: Target date: 07/17/2022    Pt will be compliant and knowledgeable with initial HEP for improved comfort and carryover Baseline: initial HEP given  Goal status: MET   2.  Pt will self report neck pain no greater than 5/10 for improved comfort and functional ability Baseline: 8/10 at worst 07/19/2022: 6/10 at worst Goal status: ONGOING  LONG TERM GOALS: Target date: 09/18/2022   Pt will improve bilateral cervical rotation to at least 60 deg for improved functional ability with driving Baseline: see chart 07/19/2022: see chart 09/11/2022: see chart Goal status: ONGOING   2.  Pt will decrease NDI disability score to no greater than 40% for improved subjective functional ability with work and community activities Baseline: 64% disability 08/02/22: 46% disability 09/11/2022: 44% disability Goal status:  ONGOING   3.  Pt will improve cervical flexor endurance test hold time to no less than 45 sec for improved postural endurance and decrease neck pain Baseline: 24 sec 07/19/2022: 25 sec 09/11/2022: 35 sec Goal status: ONGOING   4.  Pt will self report neck pain no greater than 1-2/10 for improved comfort and functional ability Baseline: 8/10 at worst 07/19/2022: 6/10 at worst 09/11/2022: 5/10 at worst Goal status: ONGOING     PLAN: PT FREQUENCY: 2x/week   PT DURATION: 6 weeks   PLANNED INTERVENTIONS: Therapeutic exercises, Therapeutic activity, Neuromuscular re-education, Balance training, Gait training, Patient/Family education, Self Care, Joint mobilization, Dry Needling, Electrical stimulation, Cryotherapy, Moist heat, Manual therapy, and Re-evaluation   PLAN FOR NEXT SESSION: assess HEP response, progress DNF and periscapular strength, manual and TPDN to neck and upper traps    Ward Chatters, PT 09/11/2022, 12:21 PM

## 2022-09-11 NOTE — Telephone Encounter (Signed)
Pt called stating that she had an episode on Friday where she did not faint but did get lightheaded and dizzy. She also had numbness all the way down the R leg. Pt would like to know if a referral can be sent out for her to receive a Tilt test. Please advise.

## 2022-09-11 NOTE — Telephone Encounter (Signed)
Called the pt back. I questioned whether she had followed with PCP. She didn't think that she needed to discuss this with her PCP. Pt states that she had her EMG completed yesterday. Advised since the EMG was completed yesterday we had not received those results. Per the last ov with Janett Billow, NP it was discussed if the EMG was negative then may consider CTA of head and neck. Pt states the EMG person was the one who discussed the pt getting a a tilt test. I advised that this not something that is assessed or done in our practice. Informed the patient that the PCP would need to send to cardiology who would likely be more familiar with this testing. She verbalized understanding. Informed that once we get the EMG results, I will pass that along to Janett Billow, NP for her to review and determine next steps. Pt verbalized understanding.

## 2022-09-12 NOTE — Therapy (Signed)
OUTPATIENT PHYSICAL THERAPY TREATMENT NOTE   Patient Name: Monique [name] MRN: [id number] DOB:1985-11-26, 37 y.o., female Today's Date: 09/13/2022  PCP: Ladell Pier, MD REFERRING PROVIDER: Izora Ribas, MD  END OF SESSION:   PT End of Session - 09/13/22 1134     Visit Number 15    Number of Visits 17    Date for PT Re-Evaluation 09/18/22    Authorization Type [employer]    PT Start Time 1133    PT Stop Time 1212    PT Time Calculation (min) 39 min    Activity Tolerance Patient tolerated treatment well;Patient limited by pain    Behavior During Therapy WFL for tasks assessed/performed                    Past Medical History:  Diagnosis Date   Anemia    Anxiety    Cervicalgia    Chronic migraine with aura    Chronic pain syndrome    Cognitive deficit as late effect of traumatic brain injury ([place])    Depression    H/O cervical fracture    Heart murmur    Menorrhagia    TBI (traumatic brain injury) [facility])    Past Surgical History:  Procedure Laterality Date   APPENDECTOMY     BONE EXCISION Right 02/02/2021   Procedure: OSTEOPHYTE EXCISION;  Surgeon: Altamese Cuba, MD;  Location: [place];  Service: Orthopedics;  Laterality: Right;   Ceasarean section  2007, 2010,2014   EXTERNAL FIXATION LEG Bilateral 12/14/2012   Procedure: EXTERNAL FIXATION LEG;  Surgeon: [name], MD;  Location: [place];  Service: Orthopedics;  Laterality: Bilateral;   EXTERNAL FIXATION REMOVAL Bilateral 12/23/2012   Procedure: REMOVAL EXTERNAL FIXATION LEG;  Surgeon: [name], MD;  Location: [place];  Service: Orthopedics;  Laterality: Bilateral;   HARDWARE REMOVAL Right 02/02/2021   Procedure: HARDWARE REMOVAL;  Surgeon: Altamese [place], MD;  Location: [place];  Service: Orthopedics;  Laterality: Right;   I & D EXTREMITY Left 12/14/2012   Procedure: IRRIGATION AND DEBRIDEMENT EXTREMITY;  Surgeon: [name], MD;  Location: [place];  Service: Orthopedics;  Laterality:  Left;   ORIF ANKLE FRACTURE Left 12/14/2012   Procedure: OPEN REDUCTION INTERNAL FIXATION (ORIF) ANKLE FRACTURE;  Surgeon: [name], MD;  Location: [place];  Service: Orthopedics;  Laterality: Left;   ORIF TIBIA FRACTURE Bilateral 12/23/2012   Procedure: OPEN REDUCTION INTERNAL FIXATION (ORIF) BILATERAL DISTAL TIBIA FRACTURE;  Surgeon: [name], MD;  Location: [place];  Service: Orthopedics;  Laterality: Bilateral;   Patient Active Problem List   Diagnosis Date Noted   Fibroids 10/19/2020   History of uterine fibroid 08/12/2020   Menorrhagia with irregular cycle 08/12/2020   Gastroesophageal reflux disease without esophagitis 06/02/2020   Keloid of skin 06/02/2020   Vitamin D deficiency 04/29/2019   Iron deficiency anemia    Iliotibial band syndrome affecting right lower leg 11/05/2018   Therapeutic opioid induced constipation 11/05/2018   Chronic pain syndrome 05/12/2018   Cognitive deficit as late effect of traumatic brain injury ([place]) 08/31/2014   TBI (traumatic brain injury) ([place]) 09/16/2013   Tibial nerve lesion 03/31/2013   Neck mass 02/06/2013   New onset of headaches due to trauma 01/02/2013   Migraine 12/21/2012   Concussion 12/18/2012   Closed C2 fracture ([place]) 12/15/2012   Fracture, sternum closed 12/15/2012   Bilateral ankle fractures 12/15/2012   Pulmonary contusion 12/15/2012    REFERRING DIAG: M54.2 (ICD-10-CM) -  Cervicalgia  THERAPY DIAG:  Cervicalgia  Muscle weakness (generalized)  Rationale for Evaluation and Treatment Rehabilitation  PERTINENT HISTORY: Past C2 fx, TBI, ankle fx's  PRECAUTIONS: None  SUBJECTIVE: Pt presents to PT with reports of continued neck pain although it is decreasing over last few sessions. Has been compliant with HEP with no adverse effect. Pt is ready to begin PT at this time.   PAIN:  Are you having pain?  Yes: NPRS scale: 5/10 (8-9/10 at worst) Pain location: neck, upper traps Pain description:  stiffness Aggravating factors: sitting at desk Relieving factors: none   OBJECTIVE: (objective measures completed at initial evaluation unless otherwise dated)  PATIENT SURVEYS:  NDI: 64% disability - eval NDI: 58% disability - 07/19/2022 NDI: 46 % disability - 08/02/2022 NDI: 44 % disability - 09/11/2022   COGNITION: Overall cognitive status: Within functional limits for tasks assessed   SENSATION: WFL   POSTURE: rounded shoulders and forward head   PALPATION: TTP to bilateral upper traps and cervical paraspinals       CERVICAL ROM:    Active ROM A/PROM (deg) eval A/PROM (deg) 07/19/2022 A/PROM (deg) 09/11/2022  Flexion      Extension      Right lateral flexion      Left lateral flexion      Right rotation 35 38 40  Left rotation 30 30 65   (Blank rows = not tested)   UE Myotomes:   Myotome Right Left  C5 5/5 5/5  C6 5/5 5/5  C7 5/5 5/5  C8 5/5 5/5  T1 5/5 5/5    UPPER EXTREMITY ROM:   Active ROM Right eval Left eval  Shoulder flexion [facility]  Shoulder extension      Shoulder abduction [facility]  Shoulder adduction      Shoulder extension      Shoulder internal rotation      Shoulder external rotation      Elbow flexion      Elbow extension       (Blank rows = not tested)   UPPER EXTREMITY MMT:   MMT Right eval Left eval  Shoulder flexion [facility]  Shoulder extension      Shoulder abduction [facility]  Shoulder adduction      Shoulder extension      Shoulder internal rotation      Shoulder external rotation      Middle trapezius 3+/5 3+/5  Lower trapezius 3+/5 3+/5  Elbow flexion      Elbow extension      Wrist flexion      Wrist extension      Wrist ulnar deviation      Wrist radial deviation      Wrist pronation      Wrist supination      Grip strength       (Blank rows = not tested)   FUNCTIONAL TESTS:  Cervical flexor endurance test: 35 sec - 12/5/20223     TODAY'S TREATMENT:  [facility] Adult PT Treatment:                                                 DATE: 09/13/2022 Therapeutic Exercise: UBE lvl 1.5 x 1.5/1.5 min while taking subjective  Low row 3x10 25# High row 2x10 25# Standing extension 2x10 20# Standing W at wall x 10 S/L open  book x 10 each Prone Y/T x 10 each Supine horizontal abd 2x15 GTB Supine serratus raise YTB x 10 Manual Therapy: Suboccipital release Positional release BIL upper traps STM to cervical paraspinals Grade V prone thrust manipulation to thoracic spine  [facility] Adult PT Treatment:                                                DATE: 09/11/2022 Therapeutic Exercise: UBE lvl 1.5 x 1.5/1.5 min while taking subjective  Standing row 3x10 20# Standing extension 2x10 20# Seated bilateral ER GTB 2x15 Seated horizontal abd 2x15 GTB Supine serratus punch 2x10 3" Supine chin tuck x 10 - 5" hold Manual Therapy: Suboccipital release Positional release BIL upper traps STM to cervical paraspinals Grade V prone thrust manipulation to thoracic spine  [facility] Adult PT Treatment:                                                DATE: 09/06/2022 Therapeutic Exercise: UBE lvl 1.5 x 1.5/1.5 min while taking subjective  Standing row 3x10 20# Standing extension 2x10 20# Seated bilateral ER GTB 2x15 Seated horizontal abd 2x15 GTB Seated thoracic ext over soft foam x 10 Cervical ext SNAG with towel x 10  Manual Therapy: Suboccipital release Positional release BIL upper traps STM to cervical paraspinals Grade V prone thrust manipulation to thoracic spine  [facility] Adult PT Treatment:                                                DATE: 09/04/2022 Therapeutic Exercise: UBE lvl 1.5 x 2/2 min while taking subjective  Standing row 2x10 20# Standing extension 2x10 20# Seated bilateral ER GTB 2x15 Supine horizontal abd 2x15 GTB Supine serratus punch 2x10 3# Manual Therapy: Suboccipital release Positional release BIL upper traps STM to cervical paraspinals  PATIENT EDUCATION:  Education details: continue  HEP Person educated: Patient Education method: Explanation, Demonstration, and Handouts Education comprehension: verbalized understanding and returned demonstration     HOME EXERCISE PROGRAM: Access Code: [email or web address] URL: [email or web address] Date: 06/26/2022 Prepared by: Octavio Manns   Exercises - Median Nerve Tensioner  - 1 x daily - 7 x weekly - 2 sets - 10 reps - Standing Shoulder Row with Anchored Resistance  - 1 x daily - 7 x weekly - 3 sets - 10 reps - green  hold - Supine Chin Tuck  - 1 x daily - 7 x weekly - 2 sets - 10 reps - 3 sec hold   ASSESSMENT:   CLINICAL IMPRESSION: Pt was able to complete prescribed exercises, noted decrease in pain post session. Responded well to manual therapy interventions, reporting slight decrease in tightness and decreased pain after grade V thoracic manipulation. Pt progressing well with therapy, will continue per POC.    OBJECTIVE IMPAIRMENTS decreased activity tolerance, decreased mobility, decreased ROM, decreased strength, postural dysfunction, and pain.    ACTIVITY LIMITATIONS carrying, lifting, reach over head, and caring for others   PARTICIPATION LIMITATIONS: driving, community activity, and occupation   PERSONAL FACTORS Time since onset of injury/illness/exacerbation and 1-2 comorbidities: Past C2 fx,  TBI, ankle fx's  are also affecting patient's functional outcome.      GOALS: Goals reviewed with patient? No   SHORT TERM GOALS: Target date: 07/17/2022    Pt will be compliant and knowledgeable with initial HEP for improved comfort and carryover Baseline: initial HEP given  Goal status: MET   2.  Pt will self report neck pain no greater than 5/10 for improved comfort and functional ability Baseline: 8/10 at worst 07/19/2022: 6/10 at worst Goal status: ONGOING   LONG TERM GOALS: Target date: 09/18/2022   Pt will improve bilateral cervical rotation to at least 60 deg for improved functional ability with  driving Baseline: see chart 07/19/2022: see chart 09/11/2022: see chart Goal status: ONGOING   2.  Pt will decrease NDI disability score to no greater than 40% for improved subjective functional ability with work and community activities Baseline: 64% disability 08/02/22: 46% disability 09/11/2022: 44% disability Goal status: ONGOING   3.  Pt will improve cervical flexor endurance test hold time to no less than 45 sec for improved postural endurance and decrease neck pain Baseline: 24 sec 07/19/2022: 25 sec 09/11/2022: 35 sec Goal status: ONGOING   4.  Pt will self report neck pain no greater than 1-2/10 for improved comfort and functional ability Baseline: 8/10 at worst 07/19/2022: 6/10 at worst 09/11/2022: 5/10 at worst Goal status: ONGOING     PLAN: PT FREQUENCY: 2x/week   PT DURATION: 6 weeks   PLANNED INTERVENTIONS: Therapeutic exercises, Therapeutic activity, Neuromuscular re-education, Balance training, Gait training, Patient/Family education, Self Care, Joint mobilization, Dry Needling, Electrical stimulation, Cryotherapy, Moist heat, Manual therapy, and Re-evaluation   PLAN FOR NEXT SESSION: assess HEP response, progress DNF and periscapular strength, manual and TPDN to neck and upper traps    Ward Chatters, PT 09/13/2022, 12:13 PM

## 2022-09-12 NOTE — Telephone Encounter (Signed)
Results received, placed on NP desk for review

## 2022-09-12 NOTE — Telephone Encounter (Signed)
Called the patient back to discuss NCV/EMG results. There was no answer. LVM and advised that Monique [name] reviewed the EMG/NCV results and it was normal.  Bennie Pierini spoke with Dr Billey Gosling and she recommends CTA of head and neck. LVM with this information since I told her about this being next steps yesterday. Instructed the pt to call back if she had any questions.

## 2022-09-12 NOTE — Telephone Encounter (Signed)
Received results from [facility] from EMG/NCV completed on 09/10/2022.  Per report, this was a normal study without any concerning findings.  Per Dr. Georgina Peer recommendations, will proceed with CTA head/neck to rule out vascular etiology.

## 2022-09-12 NOTE — Addendum Note (Signed)
Addended by: Frann Rider L on: 09/12/2022 02:04 PM   Modules accepted: Orders

## 2022-09-13 ENCOUNTER — Ambulatory Visit: Payer: Medicaid Other

## 2022-09-13 DIAGNOSIS — M6281 Muscle weakness (generalized): Secondary | ICD-10-CM

## 2022-09-13 DIAGNOSIS — M542 Cervicalgia: Secondary | ICD-10-CM

## 2022-09-17 ENCOUNTER — Telehealth: Payer: Self-pay | Admitting: Adult Health

## 2022-09-17 NOTE — Telephone Encounter (Signed)
[place] medicaid NPR sent to [facility] [phone] 

## 2022-09-17 NOTE — Therapy (Signed)
OUTPATIENT PHYSICAL THERAPY TREATMENT NOTE   Patient Name: Monique [name] MRN: [id number] DOB:1986-06-14, 36 y.o., female Today's Date: 09/18/2022  PCP: Marcine Matar, MD REFERRING PROVIDER: Horton Chin, MD  END OF SESSION:   PT End of Session - 09/18/22 1136     Visit Number 16    Number of Visits 17    Date for PT Re-Evaluation 09/18/22    Authorization Type [employer]    PT Start Time 1133    PT Stop Time 1211    PT Time Calculation (min) 38 min    Activity Tolerance Patient tolerated treatment well;Patient limited by pain    Behavior During Therapy WFL for tasks assessed/performed                     Past Medical History:  Diagnosis Date   Anemia    Anxiety    Cervicalgia    Chronic migraine with aura    Chronic pain syndrome    Cognitive deficit as late effect of traumatic brain injury (HCC)    Depression    H/O cervical fracture    Heart murmur    Menorrhagia    TBI (traumatic brain injury) [facility])    Past Surgical History:  Procedure Laterality Date   APPENDECTOMY     BONE EXCISION Right 02/02/2021   Procedure: OSTEOPHYTE EXCISION;  Surgeon: Myrene Galas, MD;  Location: [facility] OR;  Service: Orthopedics;  Laterality: Right;   Ceasarean section  2007, 2010,2014   EXTERNAL FIXATION LEG Bilateral 12/14/2012   Procedure: EXTERNAL FIXATION LEG;  Surgeon: Shelda Pal, MD;  Location: [facility] OR;  Service: Orthopedics;  Laterality: Bilateral;   EXTERNAL FIXATION REMOVAL Bilateral 12/23/2012   Procedure: REMOVAL EXTERNAL FIXATION LEG;  Surgeon: Budd Palmer, MD;  Location: [facility] OR;  Service: Orthopedics;  Laterality: Bilateral;   HARDWARE REMOVAL Right 02/02/2021   Procedure: HARDWARE REMOVAL;  Surgeon: Myrene Galas, MD;  Location: [facility] OR;  Service: Orthopedics;  Laterality: Right;   I & D EXTREMITY Left 12/14/2012   Procedure: IRRIGATION AND DEBRIDEMENT EXTREMITY;  Surgeon: Shelda Pal, MD;  Location: [facility] OR;  Service: Orthopedics;  Laterality:  Left;   ORIF ANKLE FRACTURE Left 12/14/2012   Procedure: OPEN REDUCTION INTERNAL FIXATION (ORIF) ANKLE FRACTURE;  Surgeon: Shelda Pal, MD;  Location: [facility] OR;  Service: Orthopedics;  Laterality: Left;   ORIF TIBIA FRACTURE Bilateral 12/23/2012   Procedure: OPEN REDUCTION INTERNAL FIXATION (ORIF) BILATERAL DISTAL TIBIA FRACTURE;  Surgeon: Budd Palmer, MD;  Location: [facility] OR;  Service: Orthopedics;  Laterality: Bilateral;   Patient Active Problem List   Diagnosis Date Noted   Fibroids 10/19/2020   History of uterine fibroid 08/12/2020   Menorrhagia with irregular cycle 08/12/2020   Gastroesophageal reflux disease without esophagitis 06/02/2020   Keloid of skin 06/02/2020   Vitamin D deficiency 04/29/2019   Iron deficiency anemia    Iliotibial band syndrome affecting right lower leg 11/05/2018   Therapeutic opioid induced constipation 11/05/2018   Chronic pain syndrome 05/12/2018   Cognitive deficit as late effect of traumatic brain injury (HCC) 08/31/2014   TBI (traumatic brain injury) (HCC) 09/16/2013   Tibial nerve lesion 03/31/2013   Neck mass 02/06/2013   New onset of headaches due to trauma 01/02/2013   Migraine 12/21/2012   Concussion 12/18/2012   Closed C2 fracture (HCC) 12/15/2012   Fracture, sternum closed 12/15/2012   Bilateral ankle fractures 12/15/2012   Pulmonary contusion 12/15/2012    REFERRING DIAG: M54.2 (  ICD-10-CM) - Cervicalgia  THERAPY DIAG:  Cervicalgia  Muscle weakness (generalized)  Rationale for Evaluation and Treatment Rehabilitation  PERTINENT HISTORY: Past C2 fx, TBI, ankle fx's  PRECAUTIONS: None  SUBJECTIVE: Pt presents to PT with reports of continued neck pain although it is decreasing over last few sessions. Has been compliant with HEP with no adverse effect. Pt is ready to begin PT at this time.    PAIN:  Are you having pain?  Yes: NPRS scale: 5/10 (8-9/10 at worst) Pain location: neck, upper traps Pain description:  stiffness Aggravating factors: sitting at desk Relieving factors: none   OBJECTIVE: (objective measures completed at initial evaluation unless otherwise dated)  PATIENT SURVEYS:  NDI: 64% disability - eval NDI: 58% disability - 07/19/2022 NDI: 46 % disability - 08/02/2022 NDI: 44 % disability - 09/11/2022   COGNITION: Overall cognitive status: Within functional limits for tasks assessed   SENSATION: WFL   POSTURE: rounded shoulders and forward head   PALPATION: TTP to bilateral upper traps and cervical paraspinals       CERVICAL ROM:    Active ROM A/PROM (deg) eval A/PROM (deg) 07/19/2022 A/PROM (deg) 09/11/2022  Flexion      Extension      Right lateral flexion      Left lateral flexion      Right rotation 35 38 40  Left rotation 30 30 65   (Blank rows = not tested)   UE Myotomes:   Myotome Right Left  C5 5/5 5/5  C6 5/5 5/5  C7 5/5 5/5  C8 5/5 5/5  T1 5/5 5/5    UPPER EXTREMITY ROM:   Active ROM Right eval Left eval  Shoulder flexion [facility]  Shoulder extension      Shoulder abduction [facility]  Shoulder adduction      Shoulder extension      Shoulder internal rotation      Shoulder external rotation      Elbow flexion      Elbow extension       (Blank rows = not tested)   UPPER EXTREMITY MMT:   MMT Right eval Left eval  Shoulder flexion [facility]  Shoulder extension      Shoulder abduction [facility]  Shoulder adduction      Shoulder extension      Shoulder internal rotation      Shoulder external rotation      Middle trapezius 3+/5 3+/5  Lower trapezius 3+/5 3+/5  Elbow flexion      Elbow extension      Wrist flexion      Wrist extension      Wrist ulnar deviation      Wrist radial deviation      Wrist pronation      Wrist supination      Grip strength       (Blank rows = not tested)   FUNCTIONAL TESTS:  Cervical flexor endurance test: 35 sec - 12/5/20223     TODAY'S TREATMENT:  [facility] Adult PT Treatment:                                                 DATE: 09/18/2022 Therapeutic Exercise: UBE lvl 1.5 x 1.5/1.5 min while taking subjective  Low row 3x10 35# High row 2x10 35# Supine can flexion x 15 S/L open book x  10 each Prone Y/T x 10 each Supine horizontal abd 3x10 BTB Supine serratus raise YTB x 10 Manual Therapy: Suboccipital release Positional release BIL upper traps STM to cervical paraspinals Grade V prone thrust manipulation to thoracic spine  [facility] Adult PT Treatment:                                                DATE: 09/13/2022 Therapeutic Exercise: UBE lvl 1.5 x 1.5/1.5 min while taking subjective  Low row 3x10 25# High row 2x10 25# Standing extension 2x10 20# Standing W at wall x 10 S/L open book x 10 each Prone Y/T x 10 each Supine horizontal abd 2x15 GTB Supine serratus raise YTB x 10 Manual Therapy: Suboccipital release Positional release BIL upper traps STM to cervical paraspinals Grade V prone thrust manipulation to thoracic spine  [facility] Adult PT Treatment:                                                DATE: 09/11/2022 Therapeutic Exercise: UBE lvl 1.5 x 1.5/1.5 min while taking subjective  Standing row 3x10 20# Standing extension 2x10 20# Seated bilateral ER GTB 2x15 Seated horizontal abd 2x15 GTB Supine serratus punch 2x10 3" Supine chin tuck x 10 - 5" hold Manual Therapy: Suboccipital release Positional release BIL upper traps STM to cervical paraspinals Grade V prone thrust manipulation to thoracic spine  [facility] Adult PT Treatment:                                                DATE: 09/06/2022 Therapeutic Exercise: UBE lvl 1.5 x 1.5/1.5 min while taking subjective  Standing row 3x10 20# Standing extension 2x10 20# Seated bilateral ER GTB 2x15 Seated horizontal abd 2x15 GTB Seated thoracic ext over soft foam x 10 Cervical ext SNAG with towel x 10  Manual Therapy: Suboccipital release Positional release BIL upper traps STM to cervical paraspinals Grade V prone thrust  manipulation to thoracic spine  [facility] Adult PT Treatment:                                                DATE: 09/04/2022 Therapeutic Exercise: UBE lvl 1.5 x 2/2 min while taking subjective  Standing row 2x10 20# Standing extension 2x10 20# Seated bilateral ER GTB 2x15 Supine horizontal abd 2x15 GTB Supine serratus punch 2x10 3# Manual Therapy: Suboccipital release Positional release BIL upper traps STM to cervical paraspinals  PATIENT EDUCATION:  Education details: continue HEP Person educated: Patient Education method: Explanation, Demonstration, and Handouts Education comprehension: verbalized understanding and returned demonstration     HOME EXERCISE PROGRAM: Access Code: [email or web address] URL: [email or web address] Date: 06/26/2022 Prepared by: Edwinna Areola   Exercises - Median Nerve Tensioner  - 1 x daily - 7 x weekly - 2 sets - 10 reps - Standing Shoulder Row with Anchored Resistance  - 1 x daily - 7 x weekly - 3 sets - 10 reps - green  hold - Supine  Chin Tuck  - 1 x daily - 7 x weekly - 2 sets - 10 reps - 3 sec hold   ASSESSMENT:   CLINICAL IMPRESSION: Pt was able to complete prescribed exercises, noted decrease in pain post session. Responded well to manual therapy interventions, reporting slight decrease in tightness and decreased pain after grade V thoracic manipulation. Pt progressing well with therapy, will continue per POC.      OBJECTIVE IMPAIRMENTS decreased activity tolerance, decreased mobility, decreased ROM, decreased strength, postural dysfunction, and pain.    ACTIVITY LIMITATIONS carrying, lifting, reach over head, and caring for others   PARTICIPATION LIMITATIONS: driving, community activity, and occupation   PERSONAL FACTORS Time since onset of injury/illness/exacerbation and 1-2 comorbidities: Past C2 fx, TBI, ankle fx's  are also affecting patient's functional outcome.      GOALS: Goals reviewed with patient? No   SHORT TERM GOALS:  Target date: 07/17/2022    Pt will be compliant and knowledgeable with initial HEP for improved comfort and carryover Baseline: initial HEP given  Goal status: MET   2.  Pt will self report neck pain no greater than 5/10 for improved comfort and functional ability Baseline: 8/10 at worst 07/19/2022: 6/10 at worst Goal status: ONGOING   LONG TERM GOALS: Target date: 09/18/2022   Pt will improve bilateral cervical rotation to at least 60 deg for improved functional ability with driving Baseline: see chart 07/19/2022: see chart 09/11/2022: see chart Goal status: ONGOING   2.  Pt will decrease NDI disability score to no greater than 40% for improved subjective functional ability with work and community activities Baseline: 64% disability 08/02/22: 46% disability 09/11/2022: 44% disability Goal status: ONGOING   3.  Pt will improve cervical flexor endurance test hold time to no less than 45 sec for improved postural endurance and decrease neck pain Baseline: 24 sec 07/19/2022: 25 sec 09/11/2022: 35 sec Goal status: ONGOING   4.  Pt will self report neck pain no greater than 1-2/10 for improved comfort and functional ability Baseline: 8/10 at worst 07/19/2022: 6/10 at worst 09/11/2022: 5/10 at worst Goal status: ONGOING     PLAN: PT FREQUENCY: 2x/week   PT DURATION: 6 weeks   PLANNED INTERVENTIONS: Therapeutic exercises, Therapeutic activity, Neuromuscular re-education, Balance training, Gait training, Patient/Family education, Self Care, Joint mobilization, Dry Needling, Electrical stimulation, Cryotherapy, Moist heat, Manual therapy, and Re-evaluation   PLAN FOR NEXT SESSION: assess HEP response, progress DNF and periscapular strength, manual and TPDN to neck and upper traps    Eloy End, PT 09/18/2022, 12:12 PM

## 2022-09-18 ENCOUNTER — Ambulatory Visit: Payer: Medicaid Other

## 2022-09-18 DIAGNOSIS — M542 Cervicalgia: Secondary | ICD-10-CM | POA: Diagnosis not present

## 2022-09-18 DIAGNOSIS — M6281 Muscle weakness (generalized): Secondary | ICD-10-CM

## 2022-09-19 NOTE — Therapy (Signed)
OUTPATIENT PHYSICAL THERAPY TREATMENT NOTE   Patient Name: Monique [name] MRN: [id number] DOB:[birth year], 36 y.o., female Today's Date: 09/20/2022  PCP: Ladell Pier, MD REFERRING PROVIDER: Izora Ribas, MD  END OF SESSION:   PT End of Session - 09/20/22 1136     Visit Number 17    Number of Visits 17    Date for PT Re-Evaluation 09/18/22    Authorization Type [employer]    PT Start Time 1135   arrived late   PT Stop Time 1210    PT Time Calculation (min) 35 min    Activity Tolerance Patient tolerated treatment well;Patient limited by pain    Behavior During Therapy WFL for tasks assessed/performed                      Past Medical History:  Diagnosis Date   Anemia    Anxiety    Cervicalgia    Chronic migraine with aura    Chronic pain syndrome    Cognitive deficit as late effect of traumatic brain injury ([place])    Depression    H/O cervical fracture    Heart murmur    Menorrhagia    TBI (traumatic brain injury) [facility])    Past Surgical History:  Procedure Laterality Date   APPENDECTOMY     BONE EXCISION Right 02/02/2021   Procedure: OSTEOPHYTE EXCISION;  Surgeon: Altamese Nelson, MD;  Location: [place];  Service: Orthopedics;  Laterality: Right;   Ceasarean section  2007, 2010,2014   EXTERNAL FIXATION LEG Bilateral 12/14/2012   Procedure: EXTERNAL FIXATION LEG;  Surgeon: [name], MD;  Location: [place];  Service: Orthopedics;  Laterality: Bilateral;   EXTERNAL FIXATION REMOVAL Bilateral 12/23/2012   Procedure: REMOVAL EXTERNAL FIXATION LEG;  Surgeon: [name], MD;  Location: [place];  Service: Orthopedics;  Laterality: Bilateral;   HARDWARE REMOVAL Right 02/02/2021   Procedure: HARDWARE REMOVAL;  Surgeon: Altamese Metaline, MD;  Location: [place];  Service: Orthopedics;  Laterality: Right;   I & D EXTREMITY Left 12/14/2012   Procedure: IRRIGATION AND DEBRIDEMENT EXTREMITY;  Surgeon: [name], MD;  Location: [place];  Service:  Orthopedics;  Laterality: Left;   ORIF ANKLE FRACTURE Left 12/14/2012   Procedure: OPEN REDUCTION INTERNAL FIXATION (ORIF) ANKLE FRACTURE;  Surgeon: [name], MD;  Location: [place];  Service: Orthopedics;  Laterality: Left;   ORIF TIBIA FRACTURE Bilateral 12/23/2012   Procedure: OPEN REDUCTION INTERNAL FIXATION (ORIF) BILATERAL DISTAL TIBIA FRACTURE;  Surgeon: [name], MD;  Location: [place];  Service: Orthopedics;  Laterality: Bilateral;   Patient Active Problem List   Diagnosis Date Noted   Fibroids 10/19/2020   History of uterine fibroid 08/12/2020   Menorrhagia with irregular cycle 08/12/2020   Gastroesophageal reflux disease without esophagitis 06/02/2020   Keloid of skin 06/02/2020   Vitamin D deficiency 04/29/2019   Iron deficiency anemia    Iliotibial band syndrome affecting right lower leg 11/05/2018   Therapeutic opioid induced constipation 11/05/2018   Chronic pain syndrome 05/12/2018   Cognitive deficit as late effect of traumatic brain injury ([place]) 08/31/2014   TBI (traumatic brain injury) ([place]) 09/16/2013   Tibial nerve lesion 03/31/2013   Neck mass 02/06/2013   New onset of headaches due to trauma 01/02/2013   Migraine 12/21/2012   Concussion 12/18/2012   Closed C2 fracture ([place]) 12/15/2012   Fracture, sternum closed 12/15/2012   Bilateral ankle fractures 12/15/2012   Pulmonary contusion 12/15/2012  REFERRING DIAG: M54.2 (ICD-10-CM) - Cervicalgia  THERAPY DIAG:  Cervicalgia  Muscle weakness (generalized)  Chronic right shoulder pain  Rationale for Evaluation and Treatment Rehabilitation  PERTINENT HISTORY: Past C2 fx, TBI, ankle fx's  PRECAUTIONS: None  SUBJECTIVE: Pt presents to PT with continued reports of tightness. Has been compliant with HEP. Ready to begin PT at this time.   PAIN:  Are you having pain?  Yes: NPRS scale: 5/10 (8-9/10 at worst) Pain location: neck, upper traps Pain description: stiffness Aggravating factors: sitting at  desk Relieving factors: none   OBJECTIVE: (objective measures completed at initial evaluation unless otherwise dated)  PATIENT SURVEYS:  NDI: 64% disability - eval NDI: 58% disability - 07/19/2022 NDI: 46 % disability - 08/02/2022 NDI: 44 % disability - 09/11/2022   COGNITION: Overall cognitive status: Within functional limits for tasks assessed   SENSATION: WFL   POSTURE: rounded shoulders and forward head   PALPATION: TTP to bilateral upper traps and cervical paraspinals       CERVICAL ROM:    Active ROM A/PROM (deg) eval A/PROM (deg) 07/19/2022 A/PROM (deg) 09/11/2022  Flexion      Extension      Right lateral flexion      Left lateral flexion      Right rotation 35 38 40  Left rotation 30 30 65   (Blank rows = not tested)   UE Myotomes:   Myotome Right Left  C5 5/5 5/5  C6 5/5 5/5  C7 5/5 5/5  C8 5/5 5/5  T1 5/5 5/5    UPPER EXTREMITY ROM:   Active ROM Right eval Left eval  Shoulder flexion [facility]  Shoulder extension      Shoulder abduction [facility]  Shoulder adduction      Shoulder extension      Shoulder internal rotation      Shoulder external rotation      Elbow flexion      Elbow extension       (Blank rows = not tested)   UPPER EXTREMITY MMT:   MMT Right eval Left eval  Shoulder flexion [facility]  Shoulder extension      Shoulder abduction [facility]  Shoulder adduction      Shoulder extension      Shoulder internal rotation      Shoulder external rotation      Middle trapezius 3+/5 3+/5  Lower trapezius 3+/5 3+/5  Elbow flexion      Elbow extension      Wrist flexion      Wrist extension      Wrist ulnar deviation      Wrist radial deviation      Wrist pronation      Wrist supination      Grip strength       (Blank rows = not tested)   FUNCTIONAL TESTS:  Cervical flexor endurance test: 35 sec - 12/5/20223     TODAY'S TREATMENT:  [facility] Adult PT Treatment:                                                DATE:  09/20/2022 Therapeutic Exercise: UBE lvl 1.5 x 1.5/1.5 min while taking subjective  Low row 3x10 25# High row 2x10 35# Supine can flexion x 15 S/L open book x 10 each Prone Y/T x 10 each Supine  horizontal abd 3x10 BTB Supine serratus punch x 10 3# Manual Therapy: Suboccipital release Positional release BIL upper traps STM to cervical paraspinals Grade V prone thrust manipulation to thoracic spine Modalities: MHP to cervical parapsinals x 10 min post session  [facility] Adult PT Treatment:                                                DATE: 09/18/2022 Therapeutic Exercise: UBE lvl 1.5 x 1.5/1.5 min while taking subjective  Low row 3x10 35# High row 2x10 35# Supine can flexion x 15 S/L open book x 10 each Prone Y/T x 10 each Supine horizontal abd 3x10 BTB Supine serratus raise YTB x 10 Manual Therapy: Suboccipital release Positional release BIL upper traps STM to cervical paraspinals Grade V prone thrust manipulation to thoracic spine  [facility] Adult PT Treatment:                                                DATE: 09/13/2022 Therapeutic Exercise: UBE lvl 1.5 x 1.5/1.5 min while taking subjective  Low row 3x10 25# High row 2x10 25# Standing extension 2x10 20# Standing W at wall x 10 S/L open book x 10 each Prone Y/T x 10 each Supine horizontal abd 2x15 GTB Supine serratus raise YTB x 10 Manual Therapy: Suboccipital release Positional release BIL upper traps STM to cervical paraspinals Grade V prone thrust manipulation to thoracic spine  [facility] Adult PT Treatment:                                                DATE: 09/11/2022 Therapeutic Exercise: UBE lvl 1.5 x 1.5/1.5 min while taking subjective  Standing row 3x10 20# Standing extension 2x10 20# Seated bilateral ER GTB 2x15 Seated horizontal abd 2x15 GTB Supine serratus punch 2x10 3" Supine chin tuck x 10 - 5" hold Manual Therapy: Suboccipital release Positional release BIL upper traps STM to cervical  paraspinals Grade V prone thrust manipulation to thoracic spine  [facility] Adult PT Treatment:                                                DATE: 09/06/2022 Therapeutic Exercise: UBE lvl 1.5 x 1.5/1.5 min while taking subjective  Standing row 3x10 20# Standing extension 2x10 20# Seated bilateral ER GTB 2x15 Seated horizontal abd 2x15 GTB Seated thoracic ext over soft foam x 10 Cervical ext SNAG with towel x 10  Manual Therapy: Suboccipital release Positional release BIL upper traps STM to cervical paraspinals Grade V prone thrust manipulation to thoracic spine  [facility] Adult PT Treatment:                                                DATE: 09/04/2022 Therapeutic Exercise: UBE lvl 1.5 x 2/2 min while taking subjective  Standing row 2x10 20#  Standing extension 2x10 20# Seated bilateral ER GTB 2x15 Supine horizontal abd 2x15 GTB Supine serratus punch 2x10 3# Manual Therapy: Suboccipital release Positional release BIL upper traps STM to cervical paraspinals  PATIENT EDUCATION:  Education details: continue HEP Person educated: Patient Education method: Explanation, Demonstration, and Handouts Education comprehension: verbalized understanding and returned demonstration     HOME EXERCISE PROGRAM: Access Code: [email or web address] URL: [email or web address] Date: 06/26/2022 Prepared by: Octavio Manns   Exercises - Median Nerve Tensioner  - 1 x daily - 7 x weekly - 2 sets - 10 reps - Standing Shoulder Row with Anchored Resistance  - 1 x daily - 7 x weekly - 3 sets - 10 reps - green  hold - Supine Chin Tuck  - 1 x daily - 7 x weekly - 2 sets - 10 reps - 3 sec hold   ASSESSMENT:   CLINICAL IMPRESSION: Pt was able to complete prescribed exercises, noted decrease in pain post session. Therapy focused on DNF and periscapular strengthening, thoracic mobility, and manual therapy for decreasing pain. Pt progressing well with therapy, will continue per POC.       OBJECTIVE IMPAIRMENTS  decreased activity tolerance, decreased mobility, decreased ROM, decreased strength, postural dysfunction, and pain.    ACTIVITY LIMITATIONS carrying, lifting, reach over head, and caring for others   PARTICIPATION LIMITATIONS: driving, community activity, and occupation   PERSONAL FACTORS Time since onset of injury/illness/exacerbation and 1-2 comorbidities: Past C2 fx, TBI, ankle fx's  are also affecting patient's functional outcome.      GOALS: Goals reviewed with patient? No   SHORT TERM GOALS: Target date: 07/17/2022    Pt will be compliant and knowledgeable with initial HEP for improved comfort and carryover Baseline: initial HEP given  Goal status: MET   2.  Pt will self report neck pain no greater than 5/10 for improved comfort and functional ability Baseline: 8/10 at worst 07/19/2022: 6/10 at worst Goal status: ONGOING   LONG TERM GOALS: Target date: 09/18/2022   Pt will improve bilateral cervical rotation to at least 60 deg for improved functional ability with driving Baseline: see chart 07/19/2022: see chart 09/11/2022: see chart Goal status: ONGOING   2.  Pt will decrease NDI disability score to no greater than 40% for improved subjective functional ability with work and community activities Baseline: 64% disability 08/02/22: 46% disability 09/11/2022: 44% disability Goal status: ONGOING   3.  Pt will improve cervical flexor endurance test hold time to no less than 45 sec for improved postural endurance and decrease neck pain Baseline: 24 sec 07/19/2022: 25 sec 09/11/2022: 35 sec Goal status: ONGOING   4.  Pt will self report neck pain no greater than 1-2/10 for improved comfort and functional ability Baseline: 8/10 at worst 07/19/2022: 6/10 at worst 09/11/2022: 5/10 at worst Goal status: ONGOING     PLAN: PT FREQUENCY: 2x/week   PT DURATION: 6 weeks   PLANNED INTERVENTIONS: Therapeutic exercises, Therapeutic activity, Neuromuscular re-education, Balance  training, Gait training, Patient/Family education, Self Care, Joint mobilization, Dry Needling, Electrical stimulation, Cryotherapy, Moist heat, Manual therapy, and Re-evaluation   PLAN FOR NEXT SESSION: assess HEP response, progress DNF and periscapular strength, manual and TPDN to neck and upper traps    Ward Chatters, PT 09/20/2022, 12:25 PM

## 2022-09-20 ENCOUNTER — Ambulatory Visit: Payer: Medicaid Other

## 2022-09-20 DIAGNOSIS — M542 Cervicalgia: Secondary | ICD-10-CM

## 2022-09-20 DIAGNOSIS — M6281 Muscle weakness (generalized): Secondary | ICD-10-CM

## 2022-09-20 DIAGNOSIS — G8929 Other chronic pain: Secondary | ICD-10-CM

## 2022-09-24 NOTE — Therapy (Addendum)
OUTPATIENT PHYSICAL THERAPY TREATMENT NOTE/DISCHARGE  PHYSICAL THERAPY DISCHARGE SUMMARY  Visits from Start of Care: 18  Current functional level related to goals / functional outcomes: See goals/objective   Remaining deficits: See goals/objective   Education / Equipment: HEP   Patient agrees to discharge. Patient goals were  mostly met . Patient is being discharged due to not returning since the last visit.   Patient Name: Monique [name] MRN: [id number] DOB:11-24-1985, 36 y.o., female Today's Date: 09/25/2022  PCP: Ladell Pier, MD REFERRING PROVIDER: Izora Ribas, MD  END OF SESSION:   PT End of Session - 09/25/22 1133     Visit Number 18    Number of Visits 19    Date for PT Re-Evaluation 09/26/22    Authorization Type [employer]    PT Start Time 1133    PT Stop Time 1211    PT Time Calculation (min) 38 min    Activity Tolerance Patient tolerated treatment well;Patient limited by pain    Behavior During Therapy WFL for tasks assessed/performed                     Past Medical History:  Diagnosis Date   Anemia    Anxiety    Cervicalgia    Chronic migraine with aura    Chronic pain syndrome    Cognitive deficit as late effect of traumatic brain injury ([place])    Depression    H/O cervical fracture    Heart murmur    Menorrhagia    TBI (traumatic brain injury) [facility])    Past Surgical History:  Procedure Laterality Date   APPENDECTOMY     BONE EXCISION Right 02/02/2021   Procedure: OSTEOPHYTE EXCISION;  Surgeon: Altamese [place] Anne, MD;  Location: [place];  Service: Orthopedics;  Laterality: Right;   Ceasarean section  2007, 2010,2014   EXTERNAL FIXATION LEG Bilateral 12/14/2012   Procedure: EXTERNAL FIXATION LEG;  Surgeon: [name], MD;  Location: [place];  Service: Orthopedics;  Laterality: Bilateral;   EXTERNAL FIXATION REMOVAL Bilateral 12/23/2012   Procedure: REMOVAL EXTERNAL FIXATION LEG;  Surgeon: [name], MD;   Location: [place];  Service: Orthopedics;  Laterality: Bilateral;   HARDWARE REMOVAL Right 02/02/2021   Procedure: HARDWARE REMOVAL;  Surgeon: Altamese Herndon, MD;  Location: [place];  Service: Orthopedics;  Laterality: Right;   I & D EXTREMITY Left 12/14/2012   Procedure: IRRIGATION AND DEBRIDEMENT EXTREMITY;  Surgeon: [name], MD;  Location: [place];  Service: Orthopedics;  Laterality: Left;   ORIF ANKLE FRACTURE Left 12/14/2012   Procedure: OPEN REDUCTION INTERNAL FIXATION (ORIF) ANKLE FRACTURE;  Surgeon: [name], MD;  Location: [place];  Service: Orthopedics;  Laterality: Left;   ORIF TIBIA FRACTURE Bilateral 12/23/2012   Procedure: OPEN REDUCTION INTERNAL FIXATION (ORIF) BILATERAL DISTAL TIBIA FRACTURE;  Surgeon: [name], MD;  Location: [place];  Service: Orthopedics;  Laterality: Bilateral;   Patient Active Problem List   Diagnosis Date Noted   Fibroids 10/19/2020   History of uterine fibroid 08/12/2020   Menorrhagia with irregular cycle 08/12/2020   Gastroesophageal reflux disease without esophagitis 06/02/2020   Keloid of skin 06/02/2020   Vitamin D deficiency 04/29/2019   Iron deficiency anemia    Iliotibial band syndrome affecting right lower leg 11/05/2018   Therapeutic opioid induced constipation 11/05/2018   Chronic pain syndrome 05/12/2018   Cognitive deficit as late effect of traumatic brain injury ([place]) 08/31/2014   TBI (traumatic brain injury) (  [place]) 09/16/2013   Tibial nerve lesion 03/31/2013   Neck mass 02/06/2013   New onset of headaches due to trauma 01/02/2013   Migraine 12/21/2012   Concussion 12/18/2012   Closed C2 fracture ([place]) 12/15/2012   Fracture, sternum closed 12/15/2012   Bilateral ankle fractures 12/15/2012   Pulmonary contusion 12/15/2012    REFERRING DIAG: M54.2 (ICD-10-CM) - Cervicalgia  THERAPY DIAG:  Cervicalgia - Plan: PT plan of care cert/re-cert  Muscle weakness (generalized) - Plan: PT plan of care cert/re-cert  Chronic right  shoulder pain - Plan: PT plan of care cert/re-cert  Rationale for Evaluation and Treatment Rehabilitation  PERTINENT HISTORY: Past C2 fx, TBI, ankle fx's  PRECAUTIONS: None  SUBJECTIVE: Pt presents to PT with reports of continued neck tightness and discomfort. Has been compliant with HEP. Ready to begin PT at this time.   PAIN:  Are you having pain?  Yes: NPRS scale: 6/10 (8-9/10 at worst) Pain location: neck, upper traps Pain description: stiffness Aggravating factors: sitting at desk Relieving factors: none   OBJECTIVE: (objective measures completed at initial evaluation unless otherwise dated)  PATIENT SURVEYS:  NDI: 64% disability - eval NDI: 58% disability - 07/19/2022 NDI: 46 % disability - 08/02/2022 NDI: 44 % disability - 09/11/2022   COGNITION: Overall cognitive status: Within functional limits for tasks assessed   SENSATION: WFL   POSTURE: rounded shoulders and forward head   PALPATION: TTP to bilateral upper traps and cervical paraspinals       CERVICAL ROM:    Active ROM A/PROM (deg) eval A/PROM (deg) 07/19/2022 A/PROM (deg) 09/11/2022  Flexion      Extension      Right lateral flexion      Left lateral flexion      Right rotation 35 38 40  Left rotation 30 30 65   (Blank rows = not tested)   UE Myotomes:   Myotome Right Left  C5 5/5 5/5  C6 5/5 5/5  C7 5/5 5/5  C8 5/5 5/5  T1 5/5 5/5    UPPER EXTREMITY ROM:   Active ROM Right eval Left eval  Shoulder flexion [facility]  Shoulder extension      Shoulder abduction [facility]  Shoulder adduction      Shoulder extension      Shoulder internal rotation      Shoulder external rotation      Elbow flexion      Elbow extension       (Blank rows = not tested)   UPPER EXTREMITY MMT:   MMT Right eval Left eval  Shoulder flexion [facility]  Shoulder extension      Shoulder abduction [facility]  Shoulder adduction      Shoulder extension      Shoulder internal rotation      Shoulder external  rotation      Middle trapezius 3+/5 3+/5  Lower trapezius 3+/5 3+/5  Elbow flexion      Elbow extension      Wrist flexion      Wrist extension      Wrist ulnar deviation      Wrist radial deviation      Wrist pronation      Wrist supination      Grip strength       (Blank rows = not tested)   FUNCTIONAL TESTS:  Cervical flexor endurance test: 35 sec - 12/5/20223     TODAY'S TREATMENT:  [facility] Adult PT Treatment:  DATE: 09/25/2022 Therapeutic Exercise: UBE lvl 1.5 x 2/2 min while taking subjective Standing row 2x10 20# Standing shoulder ext 2x10 20# Standing horizontal abd cross 2x10 3# Qped thread the needle with foam  x 10 each Supine cane flexion x 15 S/L open book x 10 each Prone I/Y/T x 10 each Seated bilateral ER 2x15 GTB Manual Therapy: Suboccipital release Positional release BIL upper traps STM to cervical paraspinals Grade V prone thrust manipulation to thoracic spine  [facility] Adult PT Treatment:                                                DATE: 09/18/2022 Therapeutic Exercise: UBE lvl 1.5 x 1.5/1.5 min while taking subjective  Low row 3x10 35# High row 2x10 35# Supine can flexion x 15 S/L open book x 10 each Prone Y/T x 10 each Supine horizontal abd 3x10 BTB Supine serratus raise YTB x 10 Manual Therapy: Suboccipital release Positional release BIL upper traps STM to cervical paraspinals Grade V prone thrust manipulation to thoracic spine  [facility] Adult PT Treatment:                                                DATE: 09/13/2022 Therapeutic Exercise: UBE lvl 1.5 x 1.5/1.5 min while taking subjective  Low row 3x10 25# High row 2x10 25# Standing extension 2x10 20# Standing W at wall x 10 S/L open book x 10 each Prone Y/T x 10 each Supine horizontal abd 2x15 GTB Supine serratus raise YTB x 10 Manual Therapy: Suboccipital release Positional release BIL upper traps STM to cervical paraspinals Grade V prone  thrust manipulation to thoracic spine  [facility] Adult PT Treatment:                                                DATE: 09/11/2022 Therapeutic Exercise: UBE lvl 1.5 x 1.5/1.5 min while taking subjective  Standing row 3x10 20# Standing extension 2x10 20# Seated bilateral ER GTB 2x15 Seated horizontal abd 2x15 GTB Supine serratus punch 2x10 3" Supine chin tuck x 10 - 5" hold Manual Therapy: Suboccipital release Positional release BIL upper traps STM to cervical paraspinals Grade V prone thrust manipulation to thoracic spine  [facility] Adult PT Treatment:                                                DATE: 09/06/2022 Therapeutic Exercise: UBE lvl 1.5 x 1.5/1.5 min while taking subjective  Standing row 3x10 20# Standing extension 2x10 20# Seated bilateral ER GTB 2x15 Seated horizontal abd 2x15 GTB Seated thoracic ext over soft foam x 10 Cervical ext SNAG with towel x 10  Manual Therapy: Suboccipital release Positional release BIL upper traps STM to cervical paraspinals Grade V prone thrust manipulation to thoracic spine  [facility] Adult PT Treatment:  DATE: 09/04/2022 Therapeutic Exercise: UBE lvl 1.5 x 2/2 min while taking subjective  Standing row 2x10 20# Standing extension 2x10 20# Seated bilateral ER GTB 2x15 Supine horizontal abd 2x15 GTB Supine serratus punch 2x10 3# Manual Therapy: Suboccipital release Positional release BIL upper traps STM to cervical paraspinals  PATIENT EDUCATION:  Education details: continue HEP Person educated: Patient Education method: Explanation, Demonstration, and Handouts Education comprehension: verbalized understanding and returned demonstration     HOME EXERCISE PROGRAM: Access Code: [email or web address] URL: [email or web address] Date: 06/26/2022 Prepared by: Octavio Manns   Exercises - Median Nerve Tensioner  - 1 x daily - 7 x weekly - 2 sets - 10 reps - Standing Shoulder Row with Anchored  Resistance  - 1 x daily - 7 x weekly - 3 sets - 10 reps - green  hold - Supine Chin Tuck  - 1 x daily - 7 x weekly - 2 sets - 10 reps - 3 sec hold   ASSESSMENT:   CLINICAL IMPRESSION: Pt was able to complete all prescribed exercises with no adverse effect. Continued to respond well to manual therapy interventions, noting decreased pain post session. Will assess goals next session, pt may have maximized her current rehab potential. Will otherwise continue per POC.  OBJECTIVE IMPAIRMENTS decreased activity tolerance, decreased mobility, decreased ROM, decreased strength, postural dysfunction, and pain.    ACTIVITY LIMITATIONS carrying, lifting, reach over head, and caring for others   PARTICIPATION LIMITATIONS: driving, community activity, and occupation   PERSONAL FACTORS Time since onset of injury/illness/exacerbation and 1-2 comorbidities: Past C2 fx, TBI, ankle fx's  are also affecting patient's functional outcome.      GOALS: Goals reviewed with patient? No   SHORT TERM GOALS: Target date: 07/17/2022    Pt will be compliant and knowledgeable with initial HEP for improved comfort and carryover Baseline: initial HEP given  Goal status: MET   2.  Pt will self report neck pain no greater than 5/10 for improved comfort and functional ability Baseline: 8/10 at worst 07/19/2022: 6/10 at worst Goal status: ONGOING   LONG TERM GOALS: Target date: 09/26/2022   Pt will improve bilateral cervical rotation to at least 60 deg for improved functional ability with driving Baseline: see chart 07/19/2022: see chart 09/11/2022: see chart Goal status: ONGOING   2.  Pt will decrease NDI disability score to no greater than 40% for improved subjective functional ability with work and community activities Baseline: 64% disability 08/02/22: 46% disability 09/11/2022: 44% disability Goal status: ONGOING   3.  Pt will improve cervical flexor endurance test hold time to no less than 45 sec for  improved postural endurance and decrease neck pain Baseline: 24 sec 07/19/2022: 25 sec 09/11/2022: 35 sec Goal status: ONGOING   4.  Pt will self report neck pain no greater than 1-2/10 for improved comfort and functional ability Baseline: 8/10 at worst 07/19/2022: 6/10 at worst 09/11/2022: 5/10 at worst Goal status: ONGOING     PLAN: PT FREQUENCY: 2x/week   PT DURATION: 6 weeks   PLANNED INTERVENTIONS: Therapeutic exercises, Therapeutic activity, Neuromuscular re-education, Balance training, Gait training, Patient/Family education, Self Care, Joint mobilization, Dry Needling, Electrical stimulation, Cryotherapy, Moist heat, Manual therapy, and Re-evaluation   PLAN FOR NEXT SESSION: assess HEP response, progress DNF and periscapular strength, manual and TPDN to neck and upper traps    Ward Chatters, PT 09/25/2022, 12:22 PM

## 2022-09-25 ENCOUNTER — Ambulatory Visit: Payer: Medicaid Other

## 2022-09-25 DIAGNOSIS — G8929 Other chronic pain: Secondary | ICD-10-CM

## 2022-09-25 DIAGNOSIS — M542 Cervicalgia: Secondary | ICD-10-CM | POA: Diagnosis not present

## 2022-09-25 DIAGNOSIS — M6281 Muscle weakness (generalized): Secondary | ICD-10-CM
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
## 2022-09-27 ENCOUNTER — Ambulatory Visit: Payer: Medicaid Other

## 2022-10-09 ENCOUNTER — Encounter: Payer: Self-pay | Admitting: Psychiatry

## 2022-10-23 ENCOUNTER — Encounter: Payer: Self-pay | Admitting: Physical Medicine and Rehabilitation

## 2022-10-30 ENCOUNTER — Encounter: Payer: Self-pay | Admitting: Physical Medicine and Rehabilitation

## 2022-11-06 ENCOUNTER — Encounter: Payer: Self-pay | Admitting: Physical Medicine and Rehabilitation

## 2022-11-12 ENCOUNTER — Ambulatory Visit: Payer: Medicaid Other | Admitting: Adult Health

## 2022-12-03 ENCOUNTER — Ambulatory Visit
Admission: RE | Admit: 2022-12-03 | Discharge: 2022-12-03 | Disposition: A | Discharge status: Home / Self Care | Payer: Medicaid Other | Source: Ambulatory Visit | Attending: Adult Health | Admitting: Adult Health

## 2022-12-03 ENCOUNTER — Other Ambulatory Visit: Payer: Self-pay | Admitting: Adult Health

## 2022-12-03 DIAGNOSIS — R251 Tremor, unspecified: Secondary | ICD-10-CM

## 2022-12-03 DIAGNOSIS — R200 Anesthesia of skin: Secondary | ICD-10-CM

## 2022-12-03 DIAGNOSIS — G252 Other specified forms of tremor: Secondary | ICD-10-CM

## 2022-12-03 DIAGNOSIS — R55 Syncope and collapse: Secondary | ICD-10-CM

## 2022-12-03 MED ORDER — IOPAMIDOL (ISOVUE-370) INJECTION 76%
75.0000 mL | Freq: Once | INTRAVENOUS | Status: AC | PRN
  Administered 2022-12-03: 75 mL via INTRAVENOUS

## 2022-12-05 ENCOUNTER — Encounter: Payer: Self-pay | Admitting: Adult Health

## 2022-12-05 NOTE — Telephone Encounter (Signed)
Please advise, EMG/NCS and CT both came back normal. What would next steps be?

## 2022-12-05 NOTE — Telephone Encounter (Signed)
At recent visit, discussed her discussing cardiology evaluation with PCP for persistent symptoms. Would you recommend this at this time or would you recommend any further work up from our standpoint? Thank you!

## 2022-12-06 NOTE — Telephone Encounter (Signed)
I think discussing cardiology evaluation with her PCP is a good next step. They can determine if she needs a tilt table to test for POTS

## 2023-01-01 ENCOUNTER — Encounter: Payer: Self-pay | Admitting: Internal Medicine

## 2023-01-01 ENCOUNTER — Ambulatory Visit: Payer: Medicaid Other | Attending: Internal Medicine | Admitting: Internal Medicine

## 2023-01-01 DIAGNOSIS — D500 Iron deficiency anemia secondary to blood loss (chronic): Secondary | ICD-10-CM | POA: Diagnosis present

## 2023-01-01 DIAGNOSIS — E559 Vitamin D deficiency, unspecified: Secondary | ICD-10-CM | POA: Diagnosis not present

## 2023-01-01 DIAGNOSIS — R42 Dizziness and giddiness: Secondary | ICD-10-CM | POA: Insufficient documentation

## 2023-01-01 DIAGNOSIS — D219 Benign neoplasm of connective and other soft tissue, unspecified: Secondary | ICD-10-CM | POA: Insufficient documentation
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
## 2023-01-28 ENCOUNTER — Ambulatory Visit: Payer: Medicaid Other | Attending: Cardiovascular Disease | Admitting: Cardiovascular Disease

## 2023-01-28 ENCOUNTER — Encounter: Payer: Self-pay | Admitting: Cardiovascular Disease

## 2023-01-28 VITALS — BP 112/72 | HR 71 | Ht 65.0 in | Wt 216.4 lb

## 2023-01-28 DIAGNOSIS — R531 Weakness: Secondary | ICD-10-CM | POA: Diagnosis present

## 2023-01-28 DIAGNOSIS — R011 Cardiac murmur, unspecified: Secondary | ICD-10-CM | POA: Insufficient documentation

## 2023-01-28 DIAGNOSIS — R55 Syncope and collapse: Secondary | ICD-10-CM | POA: Diagnosis present

## 2023-01-28 DIAGNOSIS — R42 Dizziness and giddiness: Secondary | ICD-10-CM

## 2023-01-28 DIAGNOSIS — D500 Iron deficiency anemia secondary to blood loss (chronic): Secondary | ICD-10-CM | POA: Diagnosis present

## 2023-01-28 DIAGNOSIS — R5382 Chronic fatigue, unspecified: Secondary | ICD-10-CM | POA: Insufficient documentation

## 2023-01-28 NOTE — Patient Instructions (Signed)
Medication Instructions:  No changes *If you need a refill on your cardiac medications before your next appointment, please call your pharmacy*   Lab Work: CBC, CMET, TSH, Lipid Panel- today If you have labs (blood work) drawn today and your tests are completely normal, you will receive your results only by: MyChart Message (if you have MyChart) OR A paper copy in the mail If you have any lab test that is abnormal or we need to change your treatment, we will call you to review the results.   Testing/Procedures: Your physician has requested that you have an echocardiogram. Echocardiography is a painless test that uses sound waves to create images of your heart. It provides your doctor with information about the size and shape of your heart and how well your heart's chambers and valves are working. This procedure takes approximately one hour. There are no restrictions for this procedure. Please do NOT wear cologne, perfume, aftershave, or lotions (deodorant is allowed). Please arrive 15 minutes prior to your appointment time.    Follow-Up: At [facility], you and your health needs are our priority.  As part of our continuing mission to provide you with exceptional heart care, we have created designated Provider Care Teams.  These Care Teams include your primary Cardiologist (physician) and Advanced Practice Providers (APPs -  Physician Assistants and Nurse Practitioners) who all work together to provide you with the care you need, when you need it.  We recommend signing up for the patient portal called "MyChart".  Sign up information is provided on this After Visit Summary.  MyChart is used to connect with patients for Virtual Visits (Telemedicine).  Patients are able to view lab/test results, encounter notes, upcoming appointments, etc.  Non-urgent messages can be sent to your provider as well.   To learn more about what you can do with MyChart, go to [email or web address].     Your next appointment:   5 month(s)  Provider:   Dr Royann Shivers   Other Instructions Stay well hydrated/Drink plenty of water. Liberal amounts of salt.

## 2023-01-29 LAB — CBC
Hematocrit: 35.4 % (ref 34.0–46.6)
Hemoglobin: 10.1 g/dL — ABNORMAL LOW (ref 11.1–15.9)
MCH: 21 pg — ABNORMAL LOW (ref 26.6–33.0)
MCHC: 28.5 g/dL — ABNORMAL LOW (ref 31.5–35.7)
MCV: 74 fL — ABNORMAL LOW (ref 79–97)
Platelets: 372 10*3/uL (ref 150–450)
RBC: 4.81 x10E6/uL (ref 3.77–5.28)
RDW: 19.7 % — ABNORMAL HIGH (ref 11.7–15.4)
WBC: 6 10*3/uL (ref 3.4–10.8)

## 2023-01-29 LAB — TSH: TSH: 1.11 u[IU]/mL (ref 0.450–4.500)

## 2023-01-29 LAB — COMPREHENSIVE METABOLIC PANEL
ALT: 11 IU/L (ref 0–32)
AST: 15 IU/L (ref 0–40)
Albumin/Globulin Ratio: 1.8 (ref 1.2–2.2)
Albumin: 4.3 g/dL (ref 3.9–4.9)
Alkaline Phosphatase: 67 IU/L (ref 44–121)
BUN/Creatinine Ratio: 10 (ref 9–23)
BUN: 7 mg/dL (ref 6–20)
Bilirubin Total: 0.2 mg/dL (ref 0.0–1.2)
CO2: 18 mmol/L — ABNORMAL LOW (ref 20–29)
Calcium: 8.9 mg/dL (ref 8.7–10.2)
Chloride: 102 mmol/L (ref 96–106)
Creatinine, Ser: 0.69 mg/dL (ref 0.57–1.00)
Globulin, Total: 2.4 g/dL (ref 1.5–4.5)
Glucose: 85 mg/dL (ref 70–99)
Potassium: 3.9 mmol/L (ref 3.5–5.2)
Sodium: 140 mmol/L (ref 134–144)
Total Protein: 6.7 g/dL (ref 6.0–8.5)
eGFR: 114 mL/min/{1.73_m2} (ref >59)

## 2023-01-29 LAB — LIPID PANEL
Chol/HDL Ratio: 3 ratio (ref 0.0–4.4)
Cholesterol, Total: 183 mg/dL (ref 100–199)
HDL: 62 mg/dL (ref >39)
LDL Chol Calc (NIH): 100 mg/dL — ABNORMAL HIGH (ref 0–99)
Triglycerides: 121 mg/dL (ref 0–149)
VLDL Cholesterol Cal: 21 mg/dL (ref 5–40)
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
## 2023-02-08 ENCOUNTER — Encounter: Payer: Self-pay | Admitting: Internal Medicine

## 2023-02-08 ENCOUNTER — Encounter: Payer: Self-pay | Admitting: Cardiovascular Disease

## 2023-02-08 DIAGNOSIS — Z6836 Body mass index (BMI) 36.0-36.9, adult: Secondary | ICD-10-CM

## 2023-02-08 DIAGNOSIS — E669 Obesity, unspecified: Secondary | ICD-10-CM

## 2023-02-08 NOTE — Telephone Encounter (Signed)
I do not have a particular professional in mind.  Is there a program at [facility] in the wellness [facility] that could fit her needs?

## 2023-02-13 ENCOUNTER — Ambulatory Visit (HOSPITAL_COMMUNITY)
Admission: RE | Admit: 2023-02-13 | Discharge: 2023-02-13 | Disposition: A | Discharge status: Home / Self Care | Payer: Medicaid Other | Source: Ambulatory Visit | Attending: Cardiovascular Disease | Admitting: Cardiovascular Disease

## 2023-02-13 DIAGNOSIS — R011 Cardiac murmur, unspecified: Secondary | ICD-10-CM | POA: Diagnosis not present

## 2023-02-13 LAB — ECHOCARDIOGRAM COMPLETE
Area-P 1/2: 2.97 cm2
Calc EF: 64.9 %
S' Lateral: 3.1 cm
Single Plane A2C EF: 66.6 %
Single Plane A4C EF: 65.4 %

## 2023-02-13 NOTE — Progress Notes (Signed)
Echocardiogram 2D Echocardiogram has been performed.  Toni Amend 02/13/2023, 8:43 AM

## 2023-03-08 ENCOUNTER — Ambulatory Visit: Payer: Medicaid Other | Admitting: Internal Medicine

## 2023-03-11 ENCOUNTER — Other Ambulatory Visit: Payer: Self-pay | Admitting: Physical Medicine and Rehabilitation

## 2023-03-25 ENCOUNTER — Encounter: Payer: Self-pay | Admitting: Obstetrics and Gynecology

## 2023-03-25 ENCOUNTER — Other Ambulatory Visit: Payer: Self-pay

## 2023-03-25 ENCOUNTER — Other Ambulatory Visit (HOSPITAL_COMMUNITY)
Admission: RE | Admit: 2023-03-25 | Discharge: 2023-03-25 | Disposition: A | Discharge status: Home / Self Care | Payer: Medicaid Other | Source: Ambulatory Visit | Attending: Obstetrics and Gynecology | Admitting: Obstetrics and Gynecology

## 2023-03-25 ENCOUNTER — Ambulatory Visit (INDEPENDENT_AMBULATORY_CARE_PROVIDER_SITE_OTHER): Payer: Medicaid Other | Admitting: Obstetrics and Gynecology

## 2023-03-25 VITALS — BP 99/69 | HR 77 | Ht 65.0 in | Wt 218.1 lb

## 2023-03-25 DIAGNOSIS — D219 Benign neoplasm of connective and other soft tissue, unspecified: Secondary | ICD-10-CM

## 2023-03-25 DIAGNOSIS — Z01419 Encounter for gynecological examination (general) (routine) without abnormal findings: Secondary | ICD-10-CM | POA: Insufficient documentation

## 2023-03-25 DIAGNOSIS — K929 Disease of digestive system, unspecified: Secondary | ICD-10-CM

## 2023-03-25 DIAGNOSIS — Z113 Encounter for screening for infections with a predominantly sexual mode of transmission: Secondary | ICD-10-CM | POA: Diagnosis not present

## 2023-03-25 MED ORDER — TRANEXAMIC ACID 650 MG PO TABS
1300.0000 mg | ORAL_TABLET | Freq: Three times a day (TID) | ORAL | 3 refills | Status: AC
Start: 2023-03-25 — End: ?

## 2023-03-25 NOTE — Progress Notes (Signed)
ANNUAL EXAM Patient name: Monique [name] MRN [id number]  Date of birth: 1986-06-17 Chief Complaint:   No chief complaint on file.  History of Present Illness:   Monique [name] is a 37 y.o. [phone] female being seen today for a routine annual exam.   Current complaints: Heavy bleeding with periods - heavy the first several days and then last 6 days total. Regular periods otherwise. Was diagnosed with fibroid 10/2020 by CT but no follow up imaging I.e. [place].   Notes bowel dysfunction since c-section.   Current birth control: Condoms  Patient's last menstrual period was 02/11/2023 (exact date).  Last Pap/Pap History: 2021. Results were: NILM w/ HRHPV negative. H/O abnormal pap: no   Health Maintenance Due  Topic Date Due   COVID-19 Vaccine (1) Never done   DTaP/Tdap/Td (2 - Tdap) 12/15/2022    Review of Systems:   Pertinent items are noted in HPI Denies any headaches, blurred vision, fatigue, shortness of breath, chest pain, abdominal pain, abnormal vaginal discharge/itching/odor/irritation, problems with urination or intercourse unless otherwise stated above.  Pertinent History Reviewed:  Reviewed past medical,surgical, social and family history.  Reviewed problem list, medications and allergies. Physical Assessment:   Vitals:   03/25/23 1124  BP: 99/69  Pulse: 77  Weight: 218 lb 1.6 oz (98.9 kg)  Height: 5\' 5"  (1.651 m)  Body mass index is 36.29 kg/m.   Physical Examination:  General appearance - well appearing, and in no distress Mental status - alert, oriented to person, place, and time Psych:  She has a normal mood and affect Skin - warm and dry, normal color, no suspicious lesions noted Chest - effort normal Heart - normal rate  Breasts - breasts appear normal, no suspicious masses, no skin or nipple changes or axillary nodes Abdomen - soft, nontender, nondistended, no masses or organomegaly Pelvic -  VULVA: normal appearing vulva with no masses,  tenderness or lesions  VAGINA: normal appearing vagina with normal color and discharge, no lesions  CERVIX: normal appearing cervix without discharge or lesions, no CMT UTERUS: uterus is felt to be normal size, shape, consistency and nontender  ADNEXA: No adnexal masses or tenderness noted. Extremities:  No swelling or varicosities noted  Chaperone present for exam  No results found for this or any previous visit (from the past 24 hour(s)).  Assessment & Plan:  Diagnoses and all orders for this visit:  Encounter for annual routine gynecological examination - Cervical cancer screening: Discussed guidelines. Pap with HPV done - GC/CT: accepts - Birth Control: Condoms - Breast Health: Encouraged self breast awareness/SBE. Teaching provided.  - F/U 12 months and prn  -     Cytology - PAP( [facility])  Fibroid Last imaging over 2 years ago. Recommend pelvic US to also better characterize fibroids since last imaging was CT.  Discussed options for bleeding control - she would like to try TXA.  -     US PELVIC COMPLETE WITH TRANSVAGINAL; Future -     tranexamic acid (LYSTEDA) 650 MG TABS tablet; Take 2 tablets (1,300 mg total) by mouth 3 (three) times daily. Take during menses for a maximum of five days  Routine screening for STI (sexually transmitted infection) -     Cytology - PAP( [facility]) -     Hepatitis B Surface AntiGEN -     Hepatitis C Antibody -     HIV Antibody (routine testing w rflx) -     RPR -     US PELVIC  COMPLETE WITH TRANSVAGINAL; Future  Gastrointestinal dysfunction -     Ambulatory referral to Gastroenterology      Orders Placed This Encounter  Procedures   US PELVIC COMPLETE WITH TRANSVAGINAL   Hepatitis B Surface AntiGEN   Hepatitis C Antibody   HIV Antibody (routine testing w rflx)   RPR   Ambulatory referral to Gastroenterology    Meds:  Meds ordered this encounter  Medications   tranexamic acid (LYSTEDA) 650 MG TABS tablet    Sig: Take 2  tablets (1,300 mg total) by mouth 3 (three) times daily. Take during menses for a maximum of five days    Dispense:  30 tablet    Refill:  3    Follow-up: Return in about 1 year (around 03/24/2024) for annual.  Milas Hock, MD 03/25/2023 11:57 AM

## 2023-03-25 NOTE — Patient Instructions (Addendum)
Pelvic Ultrasound Appointment on Monday, June 24th at 9:30AM. Please arrive by 9:15AM with a full bladder.  Location: [facility]       [address] [place], [place], [place] [address]  Phone: [phone]

## 2023-03-26 LAB — CYTOLOGY - PAP
Adequacy: ABSENT
Chlamydia: NEGATIVE
Comment: NEGATIVE
Comment: NEGATIVE
Comment: NEGATIVE
Comment: NORMAL
Diagnosis: NEGATIVE
High risk HPV: NEGATIVE
Neisseria Gonorrhea: NEGATIVE
Trichomonas: NEGATIVE

## 2023-03-26 LAB — RPR: RPR Ser Ql: NONREACTIVE

## 2023-03-26 LAB — HEPATITIS B SURFACE ANTIGEN: Hepatitis B Surface Ag: NEGATIVE

## 2023-03-26 LAB — HIV ANTIBODY (ROUTINE TESTING W REFLEX): HIV Screen 4th Generation wRfx: NONREACTIVE

## 2023-03-26 LAB — HEPATITIS C ANTIBODY: Hep C Virus Ab: NONREACTIVE

## 2023-04-01 ENCOUNTER — Ambulatory Visit (HOSPITAL_COMMUNITY): Admission: RE | Admit: 2023-04-01 | Payer: Medicaid Other | Source: Ambulatory Visit

## 2023-04-10 ENCOUNTER — Ambulatory Visit: Payer: Medicaid Other | Admitting: Registered"

## 2023-06-14 ENCOUNTER — Ambulatory Visit: Payer: Medicaid Other | Attending: Cardiovascular Disease | Admitting: Cardiovascular Disease

## 2023-06-19 ENCOUNTER — Telehealth: Payer: Medicaid Other | Admitting: Physician Assistant

## 2023-06-19 DIAGNOSIS — M545 Low back pain, unspecified: Secondary | ICD-10-CM

## 2023-06-19 MED ORDER — CYCLOBENZAPRINE HCL 10 MG PO TABS
10.0000 mg | ORAL_TABLET | Freq: Three times a day (TID) | ORAL | 0 refills | Status: AC | PRN
Start: 2023-06-19 — End: ?

## 2023-06-19 MED ORDER — MELOXICAM 15 MG PO TABS
15.0000 mg | ORAL_TABLET | Freq: Every day | ORAL | 0 refills | Status: AC
Start: 2023-06-19 — End: ?

## 2023-06-19 NOTE — Patient Instructions (Signed)
  Kirk Ruths, thank you for joining Piedad Climes, PA-C for today's virtual visit.  While this provider is not your primary care provider (PCP), if your PCP is located in our provider database this encounter information will be shared with them immediately following your visit.   A [facility] MyChart account gives you access to today's visit and all your visits, tests, and labs performed at [facility] " click here if you don't have a [facility] MyChart account or go to [email or web address]  Consent: (Patient) Monique [name] provided verbal consent for this virtual visit at the beginning of the encounter.  Current Medications:  Current Outpatient Medications:    ferrous sulfate 325 (65 FE) MG tablet, Take 1 tablet (325 mg total) by mouth daily with breakfast., Disp: 100 tablet, Rfl: 1   tranexamic acid (LYSTEDA) 650 MG TABS tablet, Take 2 tablets (1,300 mg total) by mouth 3 (three) times daily. Take during menses for a maximum of five days, Disp: 30 tablet, Rfl: 3   Vitamin D, Ergocalciferol, (DRISDOL) 1.25 MG (50000 UNIT) CAPS capsule, TAKE ONE CAPSULE BY MOUTH EVERY 7 DAYS, Disp: 20 capsule, Rfl: 0   Medications ordered in this encounter:  No orders of the defined types were placed in this encounter.    *If you need refills on other medications prior to your next appointment, please contact your pharmacy*  Follow-Up: Call back or seek an in-person evaluation if the symptoms worsen or if the condition fails to improve as anticipated.  [facility] [phone]  Other Instructions Take Meloxicam once daily with food. Tylenol for breakthrough pain. You can use the cyclobenzaprine in the evenings.  Heating pad to lower back in 10 minute intervals. Follow-up with your PCP if you do not hear from the office within 48 hours.    If you have been instructed to have an in-person evaluation today at a local Urgent Care facility, please use  the link below. It will take you to a list of all of our available [facility], including address, phone number and hours of operation. Please do not delay care.  [facility]  If you or a family member do not have a primary care provider, use the link below to schedule a visit and establish care. When you choose a [facility] primary care physician or advanced practice provider, you gain a long-term partner in health. Find a Primary Care Provider  Learn more about [facility]'s in-office and virtual care options: [facility] - Get Care Now
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
